# Patient Record
Sex: Male | Born: 1965 | Race: Black or African American | Hispanic: No | Marital: Single | Smoking: Never smoker
Health system: Southern US, Community
[De-identification: ages and names within clinical notes are randomized; demographics above are authoritative.]

## PROBLEM LIST (undated history)

## (undated) DIAGNOSIS — I10 Essential (primary) hypertension: Secondary | ICD-10-CM

## (undated) HISTORY — PX: TONSILLECTOMY: SUR1361

## (undated) NOTE — *Deleted (*Deleted)
*  PRELIMINARY RESULTS* Echocardiogram 2D Echocardiogram has been performed.  Joanette Gula Heiss 06/10/2020, 10:50 AM

---

## 2015-12-03 NOTE — Progress Notes (Signed)
Formatting of this note might be different from the original.  Employment testing    Electronically signed by Cornell Barman, CMA at 12/03/2015 12:52 PM EDT

## 2017-05-03 NOTE — Progress Notes (Signed)
Formatting of this note might be different from the original.  Employment testing    Electronically signed by Cornell Barman, MOA at 05/03/2017  3:35 PM EDT

## 2017-08-07 ENCOUNTER — Encounter: Payer: Self-pay | Admitting: Emergency Medicine

## 2017-08-07 ENCOUNTER — Emergency Department: Payer: No Typology Code available for payment source

## 2017-08-07 ENCOUNTER — Emergency Department
Admission: EM | Admit: 2017-08-07 | Discharge: 2017-08-07 | Disposition: A | Discharge status: Home / Self Care | Payer: No Typology Code available for payment source | Attending: Emergency Medicine | Admitting: Emergency Medicine

## 2017-08-07 DIAGNOSIS — Y999 Unspecified external cause status: Secondary | ICD-10-CM | POA: Diagnosis not present

## 2017-08-07 DIAGNOSIS — M47816 Spondylosis without myelopathy or radiculopathy, lumbar region: Secondary | ICD-10-CM | POA: Insufficient documentation

## 2017-08-07 DIAGNOSIS — S161XXA Strain of muscle, fascia and tendon at neck level, initial encounter: Secondary | ICD-10-CM | POA: Diagnosis not present

## 2017-08-07 DIAGNOSIS — M47812 Spondylosis without myelopathy or radiculopathy, cervical region: Secondary | ICD-10-CM

## 2017-08-07 DIAGNOSIS — I10 Essential (primary) hypertension: Secondary | ICD-10-CM | POA: Diagnosis not present

## 2017-08-07 DIAGNOSIS — Y939 Activity, unspecified: Secondary | ICD-10-CM | POA: Diagnosis not present

## 2017-08-07 DIAGNOSIS — S39012A Strain of muscle, fascia and tendon of lower back, initial encounter: Secondary | ICD-10-CM | POA: Insufficient documentation

## 2017-08-07 DIAGNOSIS — Y92410 Unspecified street and highway as the place of occurrence of the external cause: Secondary | ICD-10-CM | POA: Insufficient documentation

## 2017-08-07 DIAGNOSIS — S3992XA Unspecified injury of lower back, initial encounter: Secondary | ICD-10-CM | POA: Diagnosis present

## 2017-08-07 HISTORY — DX: Essential (primary) hypertension: I10
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
## 2017-08-07 NOTE — Discharge Instructions (Signed)
Continue previous medications and [facility].
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
## 2020-05-31 ENCOUNTER — Inpatient Hospital Stay
Admission: EM | Admit: 2020-05-31 | Discharge: 2020-06-15 | DRG: 871 | Disposition: A | Discharge status: Home Health | Payer: Self-pay | Attending: Family Medicine | Admitting: Family Medicine

## 2020-05-31 ENCOUNTER — Encounter: Payer: Self-pay | Admitting: Internal Medicine

## 2020-05-31 ENCOUNTER — Other Ambulatory Visit: Payer: Self-pay

## 2020-05-31 ENCOUNTER — Emergency Department: Payer: Self-pay

## 2020-05-31 DIAGNOSIS — I110 Hypertensive heart disease with heart failure: Secondary | ICD-10-CM | POA: Diagnosis present

## 2020-05-31 DIAGNOSIS — L03116 Cellulitis of left lower limb: Principal | ICD-10-CM | POA: Diagnosis present

## 2020-05-31 DIAGNOSIS — R6520 Severe sepsis without septic shock: Secondary | ICD-10-CM | POA: Diagnosis present

## 2020-05-31 DIAGNOSIS — I10 Essential (primary) hypertension: Secondary | ICD-10-CM | POA: Diagnosis present

## 2020-05-31 DIAGNOSIS — Z79899 Other long term (current) drug therapy: Secondary | ICD-10-CM

## 2020-05-31 DIAGNOSIS — A419 Sepsis, unspecified organism: Principal | ICD-10-CM | POA: Diagnosis present

## 2020-05-31 DIAGNOSIS — R000 Tachycardia, unspecified: Secondary | ICD-10-CM | POA: Diagnosis present

## 2020-05-31 DIAGNOSIS — I272 Pulmonary hypertension, unspecified: Secondary | ICD-10-CM | POA: Diagnosis present

## 2020-05-31 DIAGNOSIS — H9311 Tinnitus, right ear: Secondary | ICD-10-CM | POA: Diagnosis not present

## 2020-05-31 DIAGNOSIS — K5909 Other constipation: Secondary | ICD-10-CM | POA: Diagnosis present

## 2020-05-31 DIAGNOSIS — L0292 Furuncle, unspecified: Secondary | ICD-10-CM | POA: Diagnosis present

## 2020-05-31 DIAGNOSIS — M7989 Other specified soft tissue disorders: Secondary | ICD-10-CM | POA: Diagnosis present

## 2020-05-31 DIAGNOSIS — Z20822 Contact with and (suspected) exposure to covid-19: Secondary | ICD-10-CM | POA: Diagnosis present

## 2020-05-31 DIAGNOSIS — R700 Elevated erythrocyte sedimentation rate: Secondary | ICD-10-CM | POA: Diagnosis not present

## 2020-05-31 DIAGNOSIS — Z8249 Family history of ischemic heart disease and other diseases of the circulatory system: Secondary | ICD-10-CM

## 2020-05-31 DIAGNOSIS — E860 Dehydration: Secondary | ICD-10-CM | POA: Diagnosis present

## 2020-05-31 DIAGNOSIS — I5041 Acute combined systolic (congestive) and diastolic (congestive) heart failure: Secondary | ICD-10-CM

## 2020-05-31 DIAGNOSIS — R739 Hyperglycemia, unspecified: Secondary | ICD-10-CM | POA: Diagnosis present

## 2020-05-31 DIAGNOSIS — L299 Pruritus, unspecified: Secondary | ICD-10-CM | POA: Diagnosis not present

## 2020-05-31 DIAGNOSIS — I83228 Varicose veins of left lower extremity with both ulcer of other part of lower extremity and inflammation: Secondary | ICD-10-CM | POA: Diagnosis present

## 2020-05-31 DIAGNOSIS — M542 Cervicalgia: Secondary | ICD-10-CM | POA: Diagnosis present

## 2020-05-31 DIAGNOSIS — N179 Acute kidney failure, unspecified: Secondary | ICD-10-CM | POA: Diagnosis present

## 2020-05-31 DIAGNOSIS — Z6841 Body Mass Index (BMI) 40.0 and over, adult: Secondary | ICD-10-CM

## 2020-05-31 DIAGNOSIS — M726 Necrotizing fasciitis: Secondary | ICD-10-CM | POA: Diagnosis present

## 2020-05-31 DIAGNOSIS — K5900 Constipation, unspecified: Secondary | ICD-10-CM

## 2020-05-31 DIAGNOSIS — R7982 Elevated C-reactive protein (CRP): Secondary | ICD-10-CM | POA: Diagnosis not present

## 2020-05-31 DIAGNOSIS — E876 Hypokalemia: Secondary | ICD-10-CM | POA: Diagnosis present

## 2020-05-31 DIAGNOSIS — S81802A Unspecified open wound, left lower leg, initial encounter: Secondary | ICD-10-CM | POA: Diagnosis present

## 2020-05-31 DIAGNOSIS — E871 Hypo-osmolality and hyponatremia: Secondary | ICD-10-CM | POA: Diagnosis present

## 2020-05-31 DIAGNOSIS — I5043 Acute on chronic combined systolic (congestive) and diastolic (congestive) heart failure: Secondary | ICD-10-CM | POA: Diagnosis not present

## 2020-05-31 DIAGNOSIS — D75839 Thrombocytosis, unspecified: Secondary | ICD-10-CM | POA: Diagnosis present

## 2020-05-31 DIAGNOSIS — R7303 Prediabetes: Secondary | ICD-10-CM | POA: Diagnosis present

## 2020-05-31 LAB — CBC WITH DIFFERENTIAL/PLATELET
Abs Immature Granulocytes: 3.31 10*3/uL — ABNORMAL HIGH (ref 0.00–0.07)
Basophils Absolute: 0 10*3/uL (ref 0.0–0.1)
Basophils Relative: 0 %
Eosinophils Absolute: 0.3 10*3/uL (ref 0.0–0.5)
Eosinophils Relative: 1 %
HCT: 43.2 % (ref 39.0–52.0)
Hemoglobin: 14.6 g/dL (ref 13.0–17.0)
Immature Granulocytes: 10 %
Lymphocytes Relative: 6 %
Lymphs Abs: 1.9 10*3/uL (ref 0.7–4.0)
MCH: 29 pg (ref 26.0–34.0)
MCHC: 33.8 g/dL (ref 30.0–36.0)
MCV: 85.7 fL (ref 80.0–100.0)
Monocytes Absolute: 1.9 10*3/uL — ABNORMAL HIGH (ref 0.1–1.0)
Monocytes Relative: 6 %
Neutro Abs: 25.8 10*3/uL — ABNORMAL HIGH (ref 1.7–7.7)
Neutrophils Relative %: 77 %
Platelets: 361 10*3/uL (ref 150–400)
RBC: 5.04 MIL/uL (ref 4.22–5.81)
RDW: 14.8 % (ref 11.5–15.5)
Smear Review: NORMAL
WBC: 33.3 10*3/uL — ABNORMAL HIGH (ref 4.0–10.5)
nRBC: 0 % (ref 0.0–0.2)

## 2020-05-31 LAB — COMPREHENSIVE METABOLIC PANEL
ALT: 36 U/L (ref 0–44)
AST: 26 U/L (ref 15–41)
Albumin: 3 g/dL — ABNORMAL LOW (ref 3.5–5.0)
Alkaline Phosphatase: 116 U/L (ref 38–126)
Anion gap: 12 (ref 5–15)
BUN: 25 mg/dL — ABNORMAL HIGH (ref 6–20)
CO2: 22 mmol/L (ref 22–32)
Calcium: 8.3 mg/dL — ABNORMAL LOW (ref 8.9–10.3)
Chloride: 98 mmol/L (ref 98–111)
Creatinine, Ser: 1.49 mg/dL — ABNORMAL HIGH (ref 0.61–1.24)
GFR, Estimated: 55 mL/min — ABNORMAL LOW (ref >60)
Glucose, Bld: 129 mg/dL — ABNORMAL HIGH (ref 70–99)
Potassium: 3.4 mmol/L — ABNORMAL LOW (ref 3.5–5.1)
Sodium: 132 mmol/L — ABNORMAL LOW (ref 135–145)
Total Bilirubin: 1.5 mg/dL — ABNORMAL HIGH (ref 0.3–1.2)
Total Protein: 8.1 g/dL (ref 6.5–8.1)

## 2020-05-31 LAB — LACTIC ACID, PLASMA
Lactic Acid, Venous: 1.7 mmol/L (ref 0.5–1.9)
Lactic Acid, Venous: 1.5 mmol/L (ref 0.5–1.9)

## 2020-05-31 LAB — PROCALCITONIN: Procalcitonin: 4.22 ng/mL

## 2020-05-31 LAB — PROTIME-INR
INR: 1.3 — ABNORMAL HIGH (ref 0.8–1.2)
Prothrombin Time: 15.4 seconds — ABNORMAL HIGH (ref 11.4–15.2)

## 2020-05-31 LAB — PATHOLOGIST SMEAR REVIEW

## 2020-05-31 LAB — RESPIRATORY PANEL BY RT PCR (FLU A&B, COVID)
Influenza A by PCR: NEGATIVE
Influenza B by PCR: NEGATIVE
SARS Coronavirus 2 by RT PCR: NEGATIVE

## 2020-05-31 LAB — SEDIMENTATION RATE: Sed Rate: 82 mm/hr — ABNORMAL HIGH (ref 0–20)

## 2020-05-31 LAB — C-REACTIVE PROTEIN: CRP: 40.2 mg/dL — ABNORMAL HIGH (ref <1.0)

## 2020-05-31 LAB — MAGNESIUM: Magnesium: 2.5 mg/dL — ABNORMAL HIGH (ref 1.7–2.4)

## 2020-05-31 IMAGING — US US EXTREM LOW VENOUS*L*
1 series · 13 of 24 positions shown · non-contrast
Comparison: None.

CLINICAL DATA: Edema and redness

EXAM:
LEFT LOWER EXTREMITY VENOUS DUPLEX ULTRASOUND
TECHNIQUE: Gray-scale sonography with graded compression, as well as color
Doppler and duplex ultrasound were performed to evaluate the left
lower extremity deep venous system from the level of the common
femoral vein and including the common femoral, femoral, profunda
femoral, popliteal and calf veins including the posterior tibial,
peroneal and gastrocnemius veins when visible. The superficial great
saphenous vein was also interrogated. Spectral Doppler was utilized
to evaluate flow at rest and with distal augmentation maneuvers in
the common femoral, femoral and popliteal veins.

[Series 1: us venous img lower uni left (dvt) · portal-venous · 13 of 53 slices shown]
[im 1/53]
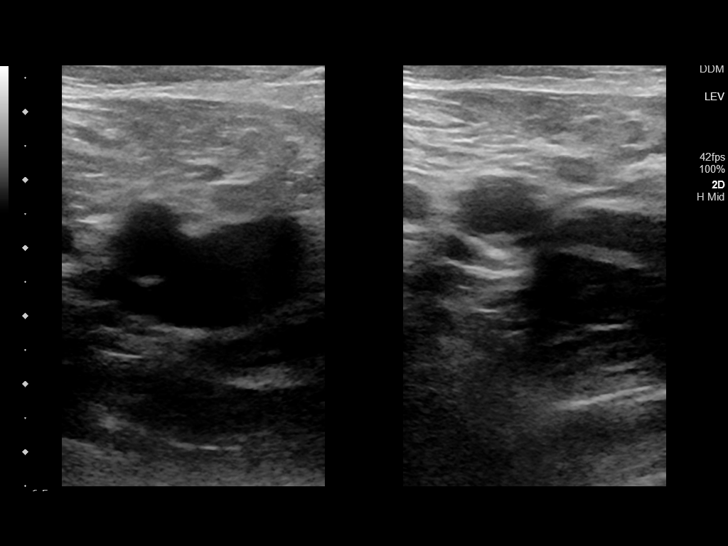
[im 5/53]
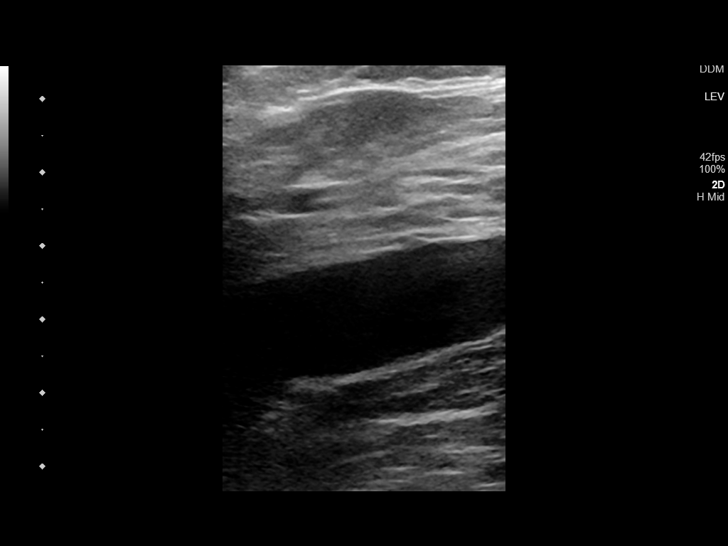
[im 10/53]
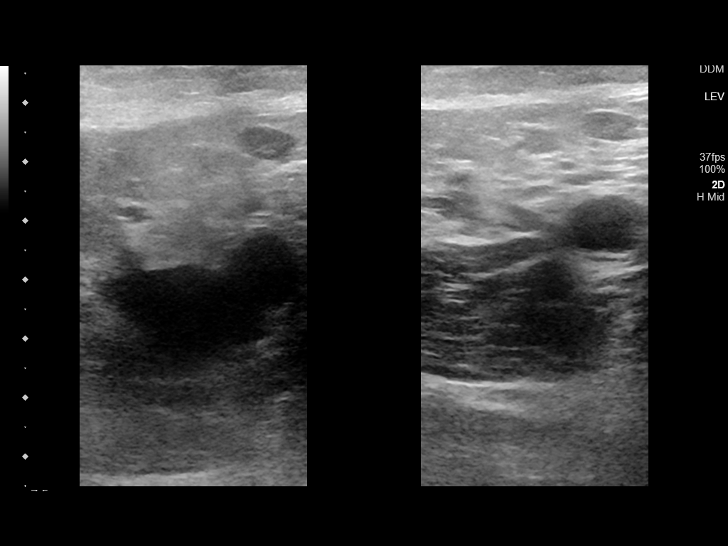
[im 14/53]
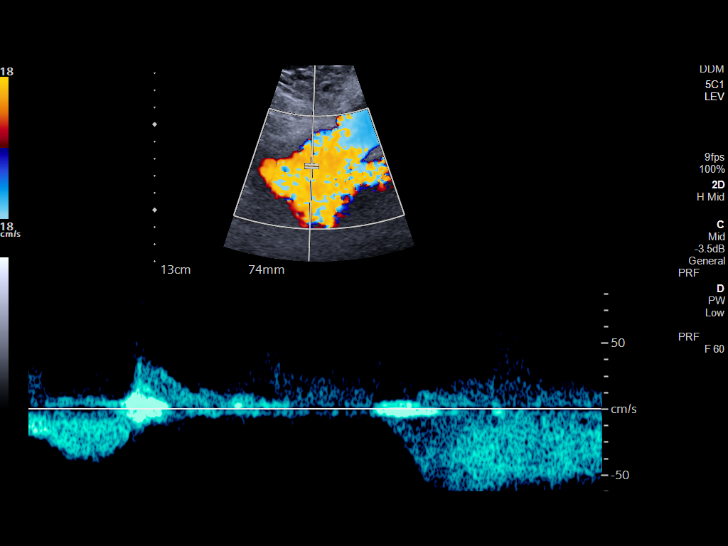
[im 19/53]
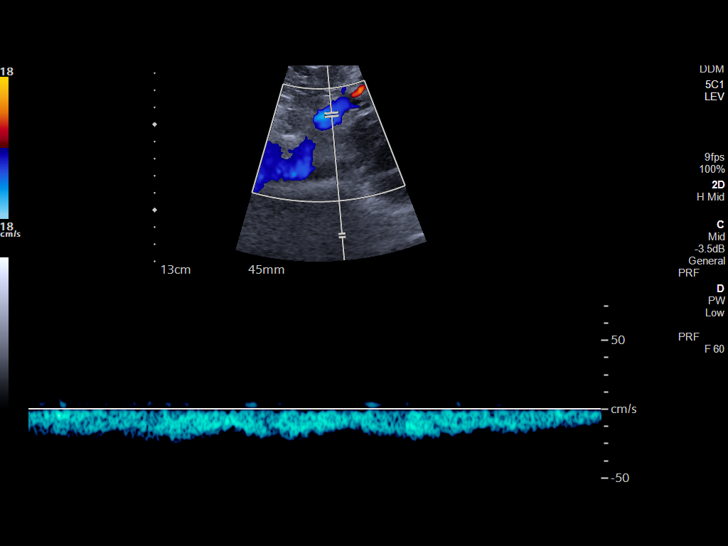
[im 23/53]
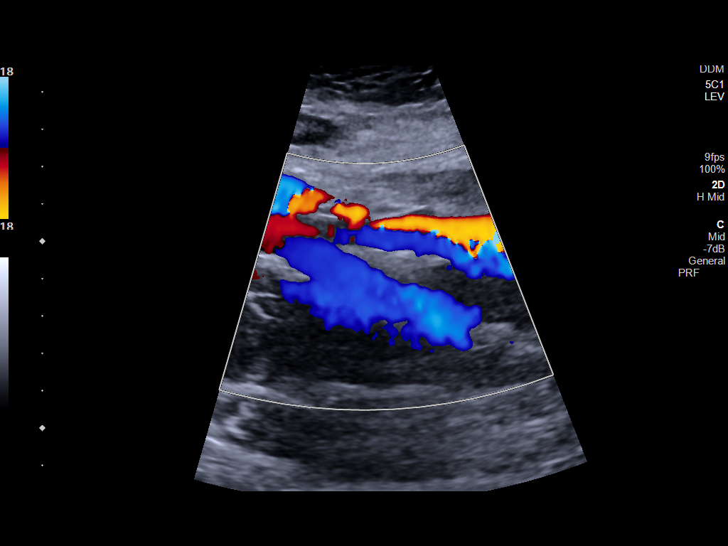
[im 28/53]
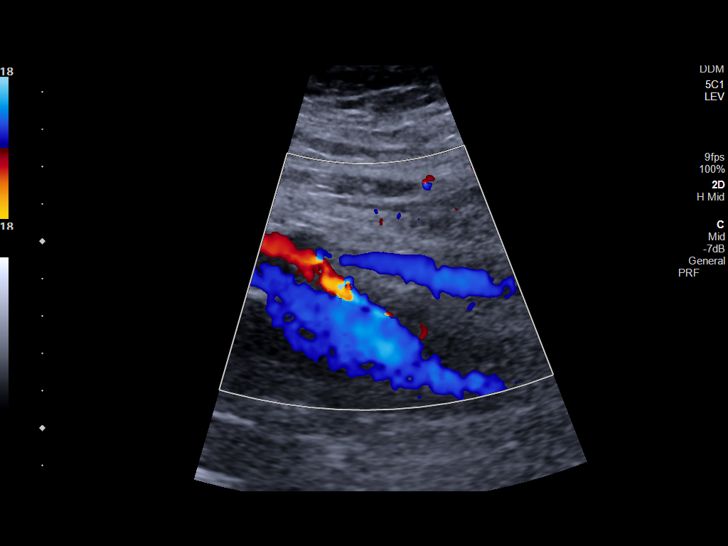
[im 30/53]
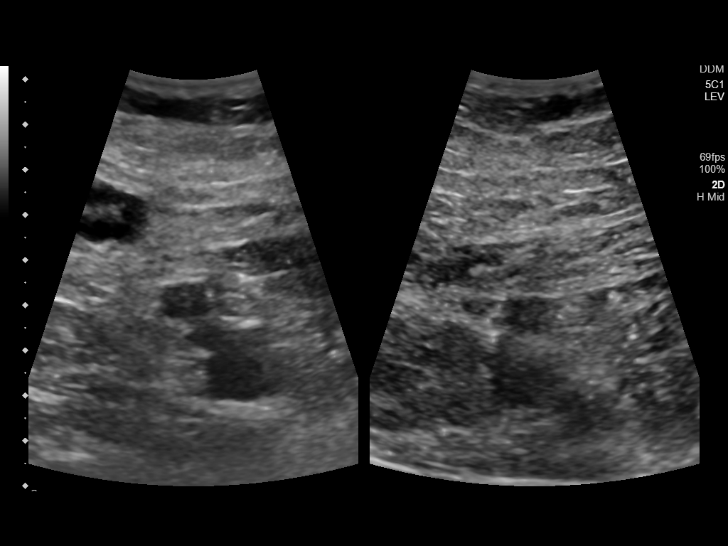
[im 34/53]
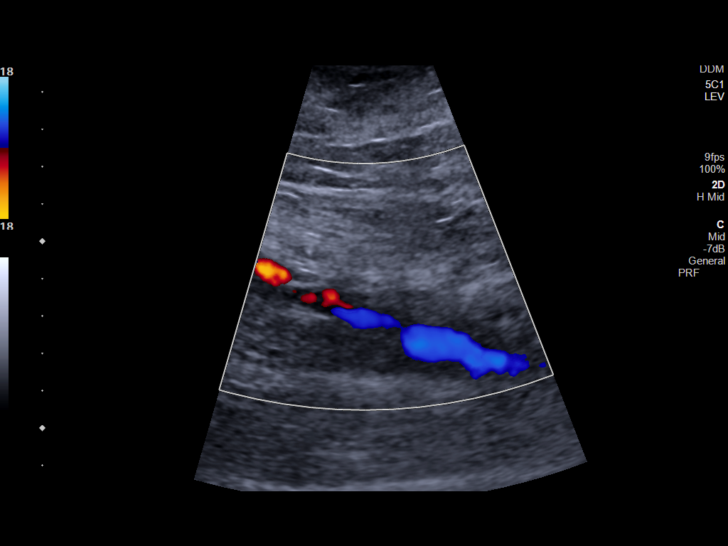
[im 39/53]
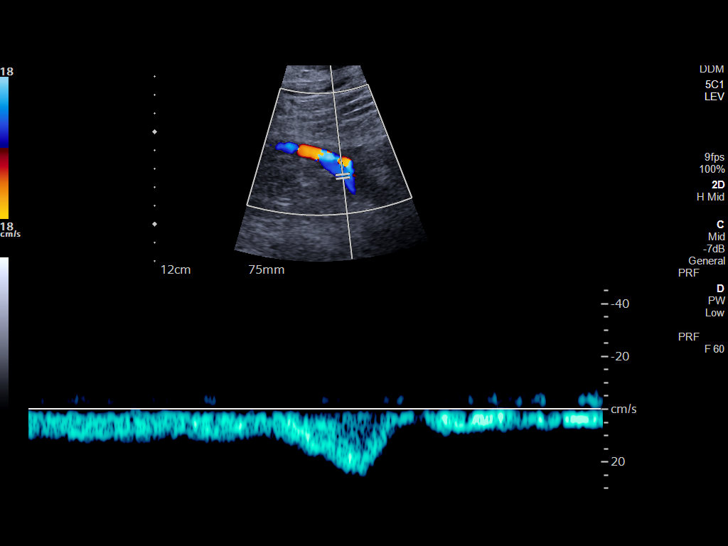
[im 43/53]
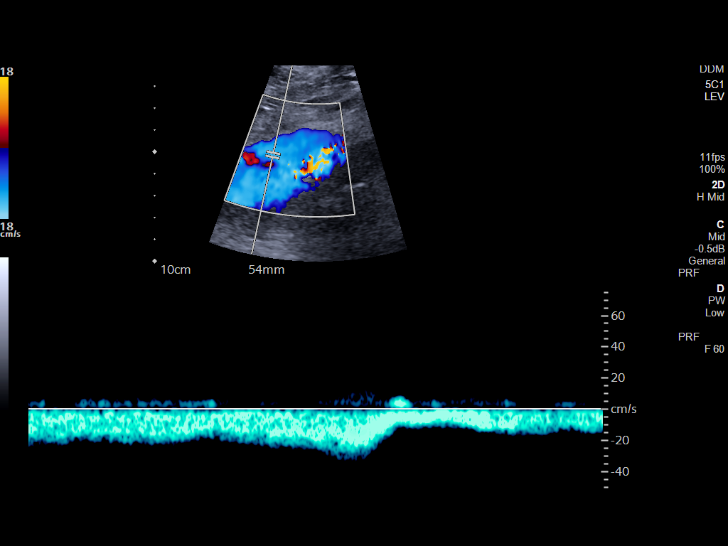
[im 48/53]
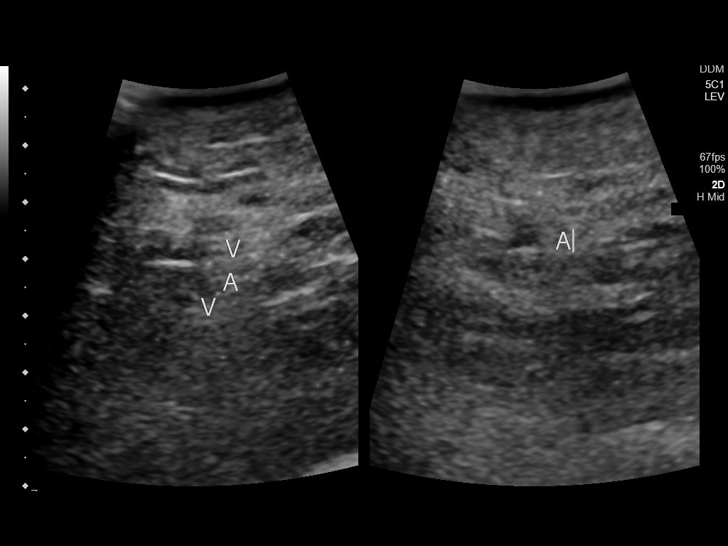
[im 53/53]
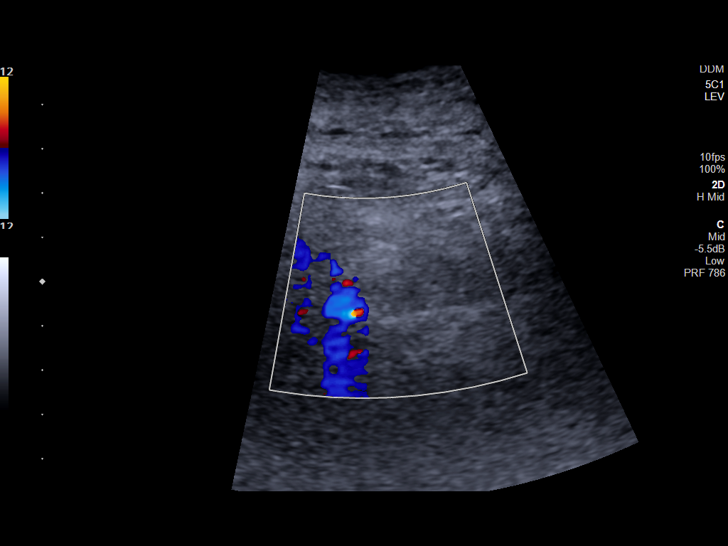

[13 of 24 positions shown; findings below may reference images not displayed]

FINDINGS: Contralateral Common Femoral Vein: Respiratory phasicity is normal
and symmetric with the symptomatic side. No evidence of thrombus.
Normal compressibility.

Common Femoral Vein: No evidence of thrombus. Normal
compressibility, respiratory phasicity and response to augmentation.

Saphenofemoral Junction: No evidence of thrombus. Normal
compressibility and flow on color Doppler imaging.

Profunda Femoral Vein: No evidence of thrombus. Normal
compressibility and flow on color Doppler imaging.

Femoral Vein: No evidence of thrombus. Normal compressibility,
respiratory phasicity and response to augmentation.

Popliteal Vein: No evidence of thrombus. Normal compressibility,
respiratory phasicity and response to augmentation.

Calf Veins: No evidence of thrombus. Normal compressibility and flow
on color Doppler imaging.

Superficial Great Saphenous Vein: No evidence of thrombus. Normal
compressibility.

Venous Reflux:  None.

Other Findings:  There is diffuse soft tissue edema.
IMPRESSION: No evidence of deep venous thrombosis in the left lower extremity.
Right common femoral vein also present. Diffuse left lower extremity
soft tissue edema noted.

## 2020-05-31 MED ORDER — VANCOMYCIN HCL IN DEXTROSE 1-5 GM/200ML-% IV SOLN
1000.0000 mg | Freq: Once | INTRAVENOUS | Status: AC
  Administered 2020-05-31: 1000 mg via INTRAVENOUS
  Filled 2020-05-31: qty 200

## 2020-05-31 MED ORDER — SODIUM CHLORIDE 0.9 % IV SOLN
2.0000 g | INTRAVENOUS | Status: DC
  Administered 2020-05-31 – 2020-06-08 (×9): 2 g via INTRAVENOUS
  Filled 2020-05-31: qty 2
  Filled 2020-05-31 (×4): qty 20
  Filled 2020-05-31 (×4): qty 2
  Filled 2020-05-31: qty 20

## 2020-05-31 MED ORDER — VANCOMYCIN HCL IN DEXTROSE 1-5 GM/200ML-% IV SOLN
1000.0000 mg | Freq: Two times a day (BID) | INTRAVENOUS | Status: DC
  Administered 2020-06-01 – 2020-06-03 (×5): 1000 mg via INTRAVENOUS
  Filled 2020-05-31 (×8): qty 200

## 2020-05-31 MED ORDER — ONDANSETRON HCL 4 MG/2ML IJ SOLN
4.0000 mg | Freq: Three times a day (TID) | INTRAMUSCULAR | Status: DC | PRN
Start: 2020-05-31 — End: 2020-06-15

## 2020-05-31 MED ORDER — ONDANSETRON HCL 4 MG/2ML IJ SOLN
4.0000 mg | Freq: Once | INTRAMUSCULAR | Status: AC
  Administered 2020-05-31: 4 mg via INTRAVENOUS
  Filled 2020-05-31: qty 2

## 2020-05-31 MED ORDER — HYDRALAZINE HCL 20 MG/ML IJ SOLN: 5.0000 mg | INTRAMUSCULAR | Status: DC | PRN

## 2020-05-31 MED ORDER — VANCOMYCIN HCL 1500 MG/300ML IV SOLN
1500.0000 mg | Freq: Once | INTRAVENOUS | Status: AC
  Administered 2020-05-31: 1500 mg via INTRAVENOUS
  Filled 2020-05-31: qty 300

## 2020-05-31 MED ORDER — ACETAMINOPHEN 325 MG PO TABS: 650.0000 mg | ORAL_TABLET | Freq: Four times a day (QID) | ORAL | Status: DC | PRN

## 2020-05-31 MED ORDER — MORPHINE SULFATE (PF) 4 MG/ML IV SOLN
4.0000 mg | Freq: Once | INTRAVENOUS | Status: AC
  Administered 2020-05-31: 4 mg via INTRAVENOUS
  Filled 2020-05-31: qty 1

## 2020-05-31 MED ORDER — SODIUM CHLORIDE 0.9 % IV BOLUS
2000.0000 mL | Freq: Once | INTRAVENOUS | Status: AC
  Administered 2020-05-31: 2000 mL via INTRAVENOUS

## 2020-05-31 MED ORDER — OXYCODONE-ACETAMINOPHEN 5-325 MG PO TABS
1.0000 | ORAL_TABLET | ORAL | Status: DC | PRN
  Administered 2020-05-31 – 2020-06-02 (×3): 1 via ORAL
  Filled 2020-05-31 (×3): qty 1

## 2020-05-31 MED ORDER — MORPHINE SULFATE (PF) 2 MG/ML IV SOLN
2.0000 mg | INTRAVENOUS | Status: DC | PRN
  Administered 2020-05-31: 2 mg via INTRAVENOUS
  Filled 2020-05-31: qty 1

## 2020-05-31 MED ORDER — POTASSIUM CHLORIDE CRYS ER 20 MEQ PO TBCR
20.0000 meq | EXTENDED_RELEASE_TABLET | Freq: Once | ORAL | Status: AC
  Administered 2020-05-31: 20 meq via ORAL
  Filled 2020-05-31: qty 1

## 2020-05-31 NOTE — ED Triage Notes (Addendum)
Pt via EMS c/o L leg pain, swelling, and weeping yellowish discharge. Pt denies hx of diabetes and CHF. Pt states it started a couple of days ago. Pt has large blister that busted at this time. Pt states it is painful to walk on. Pt is A&Ox4 and NAD.

## 2020-05-31 NOTE — ED Triage Notes (Signed)
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 198/112 , HR100, 97%RA,  97.7.

## 2020-05-31 NOTE — ED Notes (Signed)
This RN unable to obtain IV access at this time. 2nd RN will attempt. IV team consult placed

## 2020-05-31 NOTE — H&P (Signed)
History and Physical    [name] GNF:[id number] DOB: 12/17/1965 DOA: 05/31/2020  Referring MD/NP/PA:   PCP: Patient, No Pcp Per   Patient coming from:  The patient is coming from home.  At baseline, pt is independent for most of ADL.        Chief Complaint: left leg pain, swelling and weeping  HPI: Stephen [name] is a 54 y.o. male with medical history significant of HTN, who presents with left leg pain, swelling, weeping.  Patient states that his left leg pain started approximately 5 days ago, which has been progressively worsening.  Left leg is erythematous, swelling, with significant blistering, open wound and weeping from several popped blisters. The pain is constant, sharp, moderate to severe, nonradiating. 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 Patient denies fever or chills.  No chest pain, cough, shortness breath.  Denies nausea vomiting, diarrhea, abdominal pain, symptoms of UTI or unilateral weakness.  ED Course: pt was found to have WBC 33.3, lactic acid 1.7, pending COVID-19 PCR, AKI with creatinine 1.49, BUN 25 (no baseline creatinine available), temperature normal, blood pressure 126/75, tachycardia with heart rate of 100 --> 95, RR 20, oxygen saturation 96% on room air.  Left lower leg venous Doppler is negative for DVT.  Patient is placed on MedSurg bed for observation.   Review of Systems:   General: no fevers, chills, no body weight gain, has poor appetite, has fatigue HEENT: no blurry vision, hearing changes or sore throat Respiratory: no dyspnea, coughing, wheezing CV: no chest pain, no palpitations GI: no nausea, vomiting, abdominal pain, diarrhea, constipation GU: no dysuria, burning on urination, increased urinary frequency, hematuria  Ext: has left leg pain, swelling, weeping, wound and blistering Neuro: no unilateral weakness, numbness, or tingling, no vision change or hearing loss Skin: no rash.  Has open wound in the left lower  leg MSK: No muscle spasm, no deformity, no limitation of range of movement in spin Heme: No easy bruising.  Travel history: No recent long distant travel.  Allergy: No Known Allergies  Past Medical History:  Diagnosis Date  . Hypertension     Past Surgical History:  Procedure Laterality Date  . TONSILLECTOMY      Social History:  reports that he has never smoked. He has never used smokeless tobacco. He reports current alcohol use. He reports that he does not use drugs.  Family History:  Family History  Problem Relation Age of Onset  . Hypertension Mother      Prior to Admission medications   Medication Sig Start Date End Date Taking? Authorizing Provider  diclofenac (VOLTAREN) 75 MG EC tablet Take 75 mg by mouth 2 (two) times daily.    [provider]  methocarbamol (ROBAXIN) 750 MG tablet Take 750 mg by mouth 4 (four) times daily.    [provider]    Physical Exam: Vitals:   05/31/20 1003 05/31/20 1004 05/31/20 1007  BP: 126/75    Pulse: 95    Resp: 20    Temp:   97.9 F (36.6 C)  TempSrc:   Oral  SpO2: 96%    Weight:  136.1 kg   Height:  $Remove'5\' 11"'BHzAdFv$  (1.803 m)    General: Not in acute distress HEENT:       Eyes: PERRL, EOMI, no scleral icterus.       ENT: No discharge from the ears and nose, no pharynx injection, no tonsillar enlargement.  Neck: No JVD, no bruit, no mass felt. Heme: No neck lymph node enlargement. Cardiac: S1/S2, RRR, No murmurs, No gallops or rubs. Respiratory:  No rales, wheezing, rhonchi or rubs. GI: Soft, nondistended, nontender, no rebound pain, no organomegaly, BS present. GU: No hematuria Ext: 1+DP/PT pulse bilaterally.  Has erythema, tenderness, warmth, swelling in left leg from ankle up to the level above knee, with significant weeping, open wound and multiple blisters .      Musculoskeletal: No joint deformities, No joint redness or warmth, no limitation of ROM in spin. Skin: No rashes.  Neuro: Alert,  oriented X3, cranial nerves II-XII grossly intact, moves all extremities normally.  Psych: Patient is not psychotic, no suicidal or hemocidal ideation.  Labs on Admission: I have personally reviewed following labs and imaging studies  CBC: Recent Labs  Lab 05/31/20 1014  WBC 33.3*  NEUTROABS 25.8*  HGB 14.6  HCT 43.2  MCV 85.7  PLT 595   Basic Metabolic Panel: Recent Labs  Lab 05/31/20 1014  NA 132*  K 3.4*  CL 98  CO2 22  GLUCOSE 129*  BUN 25*  CREATININE 1.49*  CALCIUM 8.3*   GFR: Estimated Creatinine Clearance: 79.8 mL/min (A) (by C-G formula based on SCr of 1.49 mg/dL (H)). Liver Function Tests: Recent Labs  Lab 05/31/20 1014  AST 26  ALT 36  ALKPHOS 116  BILITOT 1.5*  PROT 8.1  ALBUMIN 3.0*   No results for input(s): LIPASE, AMYLASE in the last 168 hours. No results for input(s): AMMONIA in the last 168 hours. Coagulation Profile: No results for input(s): INR, PROTIME in the last 168 hours. Cardiac Enzymes: No results for input(s): CKTOTAL, CKMB, CKMBINDEX, TROPONINI in the last 168 hours. BNP (last 3 results) No results for input(s): PROBNP in the last 8760 hours. HbA1C: No results for input(s): HGBA1C in the last 72 hours. CBG: No results for input(s): GLUCAP in the last 168 hours. Lipid Profile: No results for input(s): CHOL, HDL, LDLCALC, TRIG, CHOLHDL, LDLDIRECT in the last 72 hours. Thyroid Function Tests: No results for input(s): TSH, T4TOTAL, FREET4, T3FREE, THYROIDAB in the last 72 hours. Anemia Panel: No results for input(s): VITAMINB12, FOLATE, FERRITIN, TIBC, IRON, RETICCTPCT in the last 72 hours. Urine analysis: No results found for: COLORURINE, APPEARANCEUR, LABSPEC, PHURINE, GLUCOSEU, HGBUR, BILIRUBINUR, KETONESUR, PROTEINUR, UROBILINOGEN, NITRITE, LEUKOCYTESUR Sepsis Labs: $RemoveBefo'@LABRCNTIP'jfirONbhulE$ (procalcitonin:4,lacticidven:4) )No results found for this or any previous visit (from the past 240 hour(s)).   Radiological Exams on Admission: US  Venous Img Lower Unilateral Left  Result Date: 05/31/2020 CLINICAL DATA:  Edema and redness EXAM: LEFT LOWER EXTREMITY VENOUS DUPLEX ULTRASOUND TECHNIQUE: Gray-scale sonography with graded compression, as well as color Doppler and duplex ultrasound were performed to evaluate the left lower extremity deep venous system from the level of the common femoral vein and including the common femoral, femoral, profunda femoral, popliteal and calf veins including the posterior tibial, peroneal and gastrocnemius veins when visible. The superficial great saphenous vein was also interrogated. Spectral Doppler was utilized to evaluate flow at rest and with distal augmentation maneuvers in the common femoral, femoral and popliteal veins. COMPARISON:  None. FINDINGS: Contralateral Common Femoral Vein: Respiratory phasicity is normal and symmetric with the symptomatic side. No evidence of thrombus. Normal compressibility. Common Femoral Vein: No evidence of thrombus. Normal compressibility, respiratory phasicity and response to augmentation. Saphenofemoral Junction: No evidence of thrombus. Normal compressibility and flow on color Doppler imaging. Profunda Femoral Vein: No evidence of thrombus. Normal compressibility and flow on color Doppler imaging.  Femoral Vein: No evidence of thrombus. Normal compressibility, respiratory phasicity and response to augmentation. Popliteal Vein: No evidence of thrombus. Normal compressibility, respiratory phasicity and response to augmentation. Calf Veins: No evidence of thrombus. Normal compressibility and flow on color Doppler imaging. Superficial Great Saphenous Vein: No evidence of thrombus. Normal compressibility. Venous Reflux:  None. Other Findings:  There is diffuse soft tissue edema. IMPRESSION: No evidence of deep venous thrombosis in the left lower extremity. Right common femoral vein also present. Diffuse left lower extremity soft tissue edema noted. Electronically Signed   By:  Lowella Grip III M.D.   On: 05/31/2020 12:48     EKG:   Not done in ED, will get one.   Assessment/Plan Principal Problem:   Left leg cellulitis Active Problems:   Hypertension   Sepsis ([place])   Hypokalemia   AKI (acute kidney injury) ([place])   Cellulitis of left leg   Sepsis due to left leg cellulitis: Patient meets criteria for sepsis with leukocytosis with WBC 33.3 and tachycardia with heart rate 100 -->95.  Lactate is normal, currently hemodynamically stable.  Left lower extremity venous Doppler is negative for DVT.   - Placed on MedSurg bed for observation - Empiric antimicrobial treatment with vancomycin and Rocephin - PRN Zofran for nausea, morphine and Percocet for pain - Blood cultures x 2  - ESR and CRP - wound care consult - IVF: will give 2 L of NS bolus -will get Procalcitonin   Hypertension: Blood pressure 126/75, patient not taking medications at home. -IV hydralazine as needed  Hypokalemia: Potassium 3.4 -Pleated potassium -Check magnesium level  AKI (acute kidney injury) [facility]): Creatinine 1.49, BUN 25, no baseline creatinine available, may be due to dehydration -Avoid using renal toxic medications -IV fluid as above -Follow-up with BMP      DVT ppx: SQ Lovenox Code Status: Full code Family Communication: not done, no family member is at bed side.    Disposition Plan:  Anticipate discharge back to previous environment Consults called:  none Admission status: Med-surg bed for obs       Status is: Inpatient  Remains inpatient appropriate because:Inpatient level of care appropriate due to severity of illness.  Patient has history of hypertension, now presents with left leg cellulitis with very significant erythema, weeping, open wound, multiple blisters.  Patient meets criteria for sepsis.  His presentation is highly complicated.  Patient is at high risk of deterioration.  Patient will need to be treated in hospital for at least 2 days.   Dispo:  The patient is from: Home              Anticipated d/c is to: Home              Anticipated d/c date is: 2 days              Patient currently is not medically stable to d/c.              Date of Service 05/31/2020    Ivor Costa [facility]   If 7PM-7AM, please contact night-coverage [email or web address] 05/31/2020, 2:06 PM

## 2020-05-31 NOTE — ED Notes (Signed)
Lt green, gray, purple, and one set of cultures sent to lab

## 2020-05-31 NOTE — Consult Note (Signed)
WOC Nurse Consult Note: Remote consult completed with aid of bedside RN, Tresa Endo.  Reason for Consult: cellulitis to left leg. Ruptured blister to left anterior and lateral leg Wound type:infectious  Pressure Injury POA: NA Measurement: bedside RN to perform.  Wound AJO:INOMVEHM blister with erythematous woundbed Drainage (amount, consistency, odor) moderate weeping.  Periwound:edema, erythema and tenderness to left leg only.  Dressing procedure/placement/frequency: Cleanse left leg with NS and pat dry. Apply Xeroform to wound bed.  COver with dry gauze and kerlix and tape.  Change twice daily.   Will not follow at this time.  Please re-consult if needed.  Maple Hudson MSN, RN, FNP-BC CWON Wound, Ostomy, Continence Nurse Pager [phone]

## 2020-05-31 NOTE — Progress Notes (Signed)
Pharmacy Antibiotic Note  Stephen [name] is a 54 y.o. male admitted on 05/31/2020 with left leg cellulitis. Patient reported LLE pain started about 5 days go. According to admission note leg is swallow with significant blistering.  Pharmacy has been consulted for vancomycin dosing. Patient is also receiving ceftriaxone 2g IV every 24 hours.   Plan: Vancomycin 1000mg  + 1500mg  IV ordered for a total loading dose of vancomycin 2500mg . Will start vancomycin 1000mg  IV every 12 hours.   Pharmacy will continue to monitor renal function and adjust dose as needed. Will plan to order vancomycin trough after 4-5 doses.   Height: 5\' 11"  (180.3 cm) Weight: 136.1 kg (300 lb) IBW/kg (Calculated) : 75.3  Temp (24hrs), Avg:97.9 F (36.6 C), Min:97.9 F (36.6 C), Max:97.9 F (36.6 C)  Recent Labs  Lab 05/31/20 1014  WBC 33.3*  CREATININE 1.49*  LATICACIDVEN 1.7    Estimated Creatinine Clearance: 79.8 mL/min (A) (by C-G formula based on SCr of 1.49 mg/dL (H)).    No Known Allergies  Antimicrobials this admission: 10/25 vancomycin  >>  10/25 ceftriaxone >>  Microbiology results: 10/25  BCx: pending  Thank you for allowing pharmacy to be a part of this patient's care.  , PharmD, BCPS Clinical Pharmacist 05/31/2020 2:26 PM

## 2020-05-31 NOTE — Progress Notes (Signed)
CODE SEPSIS - PHARMACY COMMUNICATION  **Broad Spectrum Antibiotics should be administered within 1 hour of Sepsis diagnosis**  Time Code Sepsis Called/Page Received: @ 1327   Antibiotics Ordered: Vancomycin   Time of 1st antibiotic administration: @ 1355   Gardner Candle, PharmD, BCPS Clinical Pharmacist 05/31/2020 1:59 PM

## 2020-05-31 NOTE — ED Provider Notes (Signed)
[name] Emergency Department Provider Note  Time seen: 12:04 PM  I have reviewed the triage vital signs and the nursing notes.   HISTORY  Chief Complaint Leg Pain   HPI Stephen [name] is a 54 y.o. male with a past medical history of hypertension presents to the emergency department for left leg pain and swelling and weepage.  According to the patient approximately 5 days ago he developed left leg redness and swelling over the past 2 days he has had significant blistering and weepage several of the blisters have popped.  Patient states moderate pain as well to the left leg.  Denies any nausea vomiting.  Denies any fever.  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  Patient states he takes no prescription medications currently.   Past Medical History:  Diagnosis Date  . Hypertension     There are no problems to display for this patient.    Prior to Admission medications   Medication Sig Start Date End Date Taking? Authorizing Provider  diclofenac (VOLTAREN) 75 MG EC tablet Take 75 mg by mouth 2 (two) times daily.    [provider]  methocarbamol (ROBAXIN) 750 MG tablet Take 750 mg by mouth 4 (four) times daily.    [provider]    No Known Allergies  No family history on file.  Social History Social History   Tobacco Use  . Smoking status: Never Smoker  . Smokeless tobacco: Never Used  Vaping Use  . Vaping Use: Never used  Substance Use Topics  . Alcohol use: Yes  . Drug use: No    Review of Systems Constitutional: Negative for fever. Cardiovascular: Negative for chest pain. Respiratory: Negative for shortness of breath. Gastrointestinal: Negative for abdominal pain, vomiting Genitourinary: Negative for urinary compaints Musculoskeletal: Left leg redness swelling and weepage. Skin: Redness to the left leg with blistering. Neurological: Negative for headache All other ROS  negative  ____________________________________________   PHYSICAL EXAM:  VITAL SIGNS: ED Triage Vitals  Enc Vitals Group     BP 05/31/20 1003 126/75     Pulse Rate 05/31/20 1003 95     Resp 05/31/20 1003 20     Temp 05/31/20 1007 97.9 F (36.6 C)     Temp Source 05/31/20 1007 Oral     SpO2 05/31/20 1003 96 %     Weight 05/31/20 1004 300 lb (136.1 kg)     Height 05/31/20 1004 5\' 11"  (1.803 m)     Head Circumference --      Peak Flow --      Pain Score 05/31/20 1004 6     Pain Loc --      Pain Edu? --      Excl. in GC? --    Constitutional: Alert and oriented. Well appearing and in no distress. Eyes: Normal exam ENT      Head: Normocephalic and atraumatic.      Mouth/Throat: Mucous membranes are moist. Cardiovascular: Normal rate, regular rhythm.  Respiratory: Normal respiratory effort without tachypnea nor retractions. Breath sounds are clear  Gastrointestinal: Soft and nontender. No distention.  Musculoskeletal: Moderate left leg redness swelling and blistering.  Moderate tenderness to palpation.  Patient has multiple blisters throughout the leg some of which are quite large measuring up to 8 cm in diameter.  Considerable weepage. Neurologic:  Normal speech and language. No gross focal neurologic deficits Skin:  Skin is warm.  Erythematous with blistering  to left lower extremity from just above the knee and distally. Psychiatric: Mood and affect are normal.   ____________________________________________   RADIOLOGY  Ultrasound negative for DVT.  ____________________________________________   INITIAL IMPRESSION / ASSESSMENT AND PLAN / ED COURSE  Pertinent labs & imaging results that were available during my care of the patient were reviewed by me and considered in my medical decision making (see chart for details).   Patient presents emergency department for left leg swelling redness blistering pain and weepage.  Exam is very consistent with cellulitis however as  the patient is a truck driver and given the acuity of his symptoms we will also obtain an ultrasound to rule out DVT.  Patient's labs show a significant leukocytosis of 33,000, patient denies any history of leukocytosis previously known to him.  We will start the patient on IV vancomycin, send blood cultures.  Patient will require admission to the hospital service once his emergency department work-up is been completed.  Ultrasound negative for DVT.  Patient continued on IV vancomycin and will be admitted to the hospitalist service for further treatment.  Stephen [name] was evaluated in Emergency Department on 05/31/2020 for the symptoms described in the history of present illness. He was evaluated in the context of the global COVID-19 pandemic, which necessitated consideration that the patient might be at risk for infection with the SARS-CoV-2 virus that causes COVID-19. Institutional protocols and algorithms that pertain to the evaluation of patients at risk for COVID-19 are in a state of rapid change based on information released by regulatory bodies including the CDC and federal and state organizations. These policies and algorithms were followed during the patient's care in the ED.  ____________________________________________   FINAL CLINICAL IMPRESSION(S) / ED DIAGNOSES  Cellulitis   [name], MD 05/31/20 1535

## 2020-05-31 NOTE — ED Notes (Signed)
Formatting of this note might be different from the original.  Lt green, gray, purple, and one set of cultures sent to lab    Electronically signed by Bernadette Hoit, RN at 05/31/2020 10:20 AM EDT

## 2020-05-31 NOTE — ED Triage Notes (Signed)
Formatting of this note might be different from the original.  Pt via EMS c/o L leg pain, swelling, and weeping yellowish discharge. Pt denies hx of diabetes and CHF. Pt states it started a couple of days ago. Pt has large blister that busted at this time. Pt states it is painful to walk on. Pt is A&Ox4 and NAD.   Electronically signed by Bernadette Hoit, RN at 05/31/2020 11:30 AM EDT

## 2020-05-31 NOTE — ED Provider Notes (Signed)
Formatting of this note is different from the original.  [facility]  Emergency Department Provider Note    Time seen: 12:04 PM    I have reviewed the triage vital signs and the nursing notes.    HISTORY    Chief Complaint  Leg Pain    HPI  Thomas Browning is a 54 y.o. male with a past medical history of hypertension presents to the emergency department for left leg pain and swelling and weepage.  According to the patient approximately 5 days ago he developed left leg redness and swelling over the past 2 days he has had significant blistering and weepage several of the blisters have popped.  Patient states moderate pain as well to the left leg.  Denies any nausea vomiting.  Denies any fever.  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  Patient states he takes no prescription medications currently.     Past Medical History:   Diagnosis Date   ? Hypertension      There are no problems to display for this patient.    Prior to Admission medications    Medication Sig Start Date End Date Taking? Authorizing Provider   diclofenac (VOLTAREN) 75 MG EC tablet Take 75 mg by mouth 2 (two) times daily.    [provider]   methocarbamol (ROBAXIN) 750 MG tablet Take 750 mg by mouth 4 (four) times daily.    [provider]     No Known Allergies    No family history on file.    Social History  Social History     Tobacco Use   ? Smoking status: Never Smoker   ? Smokeless tobacco: Never Used   Vaping Use   ? Vaping Use: Never used   Substance Use Topics   ? Alcohol use: Yes   ? Drug use: No     Review of Systems  Constitutional: Negative for fever.  Cardiovascular: Negative for chest pain.  Respiratory: Negative for shortness of breath.  Gastrointestinal: Negative for abdominal pain, vomiting  Genitourinary: Negative for urinary compaints  Musculoskeletal: Left leg redness swelling and weepage.  Skin: Redness to the left leg with  blistering.  Neurological: Negative for headache  All other ROS negative    ____________________________________________    PHYSICAL EXAM:    VITAL SIGNS:  ED Triage Vitals   Enc Vitals Group      BP 05/31/20 1003 126/75      Pulse Rate 05/31/20 1003 95      Resp 05/31/20 1003 20      Temp 05/31/20 1007 97.9 F (36.6 C)      Temp Source 05/31/20 1007 Oral      SpO2 05/31/20 1003 96 %      Weight 05/31/20 1004 300 lb (136.1 kg)      Height 05/31/20 1004 5\' 11"  (1.803 m)      Head Circumference --       Peak Flow --       Pain Score 05/31/20 1004 6      Pain Loc --       Pain Edu? --       Excl. in GC? --      Constitutional: Alert and oriented. Well appearing and in no distress.  Eyes: Normal exam  ENT       Head: Normocephalic and atraumatic.       Mouth/Throat: Mucous membranes are moist.  Cardiovascular:  Normal rate, regular rhythm.   Respiratory: Normal respiratory effort without tachypnea nor retractions. Breath sounds are clear   Gastrointestinal: Soft and nontender. No distention.   Musculoskeletal: Moderate left leg redness swelling and blistering.  Moderate tenderness to palpation.  Patient has multiple blisters throughout the leg some of which are quite large measuring up to 8 cm in diameter.  Considerable weepage.  Neurologic:  Normal speech and language. No gross focal neurologic deficits  Skin:  Skin is warm.  Erythematous with blistering to left lower extremity from just above the knee and distally.  Psychiatric: Mood and affect are normal.     ____________________________________________    RADIOLOGY    Ultrasound negative for DVT.    ____________________________________________    INITIAL IMPRESSION / ASSESSMENT AND PLAN / ED COURSE    Pertinent labs & imaging results that were available during my care of the patient were reviewed by me and considered in my medical decision making (see chart for details).    Patient presents emergency department for left leg swelling redness blistering pain and  weepage.  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  Patient's labs show a significant leukocytosis of 33,000, patient denies any history of leukocytosis previously known to him.  We will start the patient on IV vancomycin, send blood cultures.  Patient will require admission to the hospital service once his emergency department work-up is been completed.    Ultrasound negative for DVT.  Patient continued on IV vancomycin and will be admitted to the hospitalist service for further treatment.    Thomas Browning was evaluated in Emergency Department on 05/31/2020 for the symptoms described in the history of present illness. He was evaluated in the context of the global COVID-19 pandemic, which necessitated consideration that the patient might be at risk for infection with the SARS-CoV-2 virus that causes COVID-19. Institutional protocols and algorithms that pertain to the evaluation of patients at risk for COVID-19 are in a state of rapid change based on information released by regulatory bodies including the CDC and federal and state organizations. These policies and algorithms were followed during the patient's care in the ED.    ____________________________________________    FINAL CLINICAL IMPRESSION(S) / ED DIAGNOSES    Cellulitis    [name], MD  05/31/20 1535    Electronically signed by [name], MD at 05/31/2020  3:35 PM EDT

## 2020-05-31 NOTE — Unmapped (Signed)
Formatting of this note might be different from the original.  WOC Nurse Consult Note:  Remote consult completed with aid of bedside RN, Tresa Endo.   Reason for Consult: cellulitis to left leg. Ruptured blister to left anterior and lateral leg  Wound type:infectious   Pressure Injury POA: NA  Measurement: bedside RN to perform.   Wound ZOX:WRUEAVWU blister with erythematous woundbed  Drainage (amount, consistency, odor) moderate weeping.   Periwound:edema, erythema and tenderness to left leg only.   Dressing procedure/placement/frequency: Cleanse left leg with NS and pat dry. Apply Xeroform to wound bed.  COver with dry gauze and kerlix and tape.  Change twice daily.    Will not follow at this time.  Please re-consult if needed.   Maple Hudson MSN, RN, FNP-BC CWON  Wound, Ostomy, Continence Nurse  Pager [phone]      Electronically signed by Alphonzo Dublin, FNP at 05/31/2020  2:03 PM EDT

## 2020-05-31 NOTE — Progress Notes (Signed)
Formatting of this note might be different from the original.  CODE SEPSIS - PHARMACY COMMUNICATION    **Broad Spectrum Antibiotics should be administered within 1 hour of Sepsis diagnosis**    Time Code Sepsis Called/Page Received: @ 1327     Antibiotics Ordered: Vancomycin     Time of 1st antibiotic administration: @ 1355     Gardner Candle, PharmD, BCPS  Clinical Pharmacist  05/31/2020  1:59 PM      Electronically signed by Gardner Candle, RPH at 05/31/2020  1:59 PM EDT

## 2020-05-31 NOTE — H&P (Signed)
Formatting of this note is different from the original.  Images from the original note were not included.    History and Physical     Thomas Browning ZOX:[id number] DOB: November 10, 1965 DOA: 05/31/2020    Referring MD/NP/PA:     PCP: Patient, No Pcp Per     Patient coming from:  The patient is coming from home.  At baseline, pt is independent for most of ADL.          Chief Complaint: left leg pain, swelling and weeping    HPI: Thomas Browning is a 54 y.o. male with medical history significant of HTN, who presents with left leg pain, swelling, weeping.    Patient states that his left leg pain started approximately 5 days ago, which has been progressively worsening.  Left leg is erythematous, swelling, with significant blistering, open wound and weeping from several popped blisters. The pain is constant, sharp, moderate to severe, nonradiating. 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 Patient denies fever or chills.  No chest pain, cough, shortness breath.  Denies nausea vomiting, diarrhea, abdominal pain, symptoms of UTI or unilateral weakness.    ED Course: pt was found to have WBC 33.3, lactic acid 1.7, pending COVID-19 PCR, AKI with creatinine 1.49, BUN 25 (no baseline creatinine available), temperature normal, blood pressure 126/75, tachycardia with heart rate of 100 --> 95, RR 20, oxygen saturation 96% on room air.  Left lower leg venous Doppler is negative for DVT.  Patient is placed on MedSurg bed for observation.    Review of Systems:     General: no fevers, chills, no body weight gain, has poor appetite, has fatigue  HEENT: no blurry vision, hearing changes or sore throat  Respiratory: no dyspnea, coughing, wheezing  CV: no chest pain, no palpitations  GI: no nausea, vomiting, abdominal pain, diarrhea, constipation  GU: no dysuria, burning on urination, increased urinary frequency, hematuria   Ext: has left leg pain, swelling, weeping, wound and blistering  Neuro: no unilateral weakness,  numbness, or tingling, no vision change or hearing loss  Skin: no rash.  Has open wound in the left lower leg  MSK: No muscle spasm, no deformity, no limitation of range of movement in spin  Heme: No easy bruising.   Travel history: No recent long distant travel.    Allergy: No Known Allergies    Past Medical History:   Diagnosis Date   ? Hypertension      Past Surgical History:   Procedure Laterality Date   ? TONSILLECTOMY       Social History:  reports that he has never smoked. He has never used smokeless tobacco. He reports current alcohol use. He reports that he does not use drugs.    Family History:   Family History   Problem Relation Age of Onset   ? Hypertension Mother        Prior to Admission medications    Medication Sig Start Date End Date Taking? Authorizing Provider   diclofenac (VOLTAREN) 75 MG EC tablet Take 75 mg by mouth 2 (two) times daily.    [provider]   methocarbamol (ROBAXIN) 750 MG tablet Take 750 mg by mouth 4 (four) times daily.    [provider]     Physical Exam:  Vitals:    05/31/20 1003 05/31/20 1004 05/31/20 1007   BP: 126/75     Pulse: 95     Resp: 20  Temp:   97.9 F (36.6 C)   TempSrc:   Oral   SpO2: 96%     Weight:  136.1 kg    Height:  5\' 11"  (1.803 m)      General: Not in acute distress  HEENT:        Eyes: PERRL, EOMI, no scleral icterus.        ENT: No discharge from the ears and nose, no pharynx injection, no tonsillar enlargement.         Neck: No JVD, no bruit, no mass felt.  Heme: No neck lymph node enlargement.  Cardiac: S1/S2, RRR, No murmurs, No gallops or rubs.  Respiratory:  No rales, wheezing, rhonchi or rubs.  GI: Soft, nondistended, nontender, no rebound pain, no organomegaly, BS present.  GU: No hematuria  Ext: 1+DP/PT pulse bilaterally.  Has erythema, tenderness, warmth, swelling in left leg from ankle up to the level above knee, with significant weeping, open wound and multiple blisters .    Musculoskeletal: No joint deformities, No  joint redness or warmth, no limitation of ROM in spin.  Skin: No rashes.   Neuro: Alert, oriented X3, cranial nerves II-XII grossly intact, moves all extremities normally.   Psych: Patient is not psychotic, no suicidal or hemocidal ideation.    Labs on Admission: I have personally reviewed following labs and imaging studies    CBC:  Recent Labs   Lab 05/31/20  1014   WBC 33.3*   NEUTROABS 25.8*   HGB 14.6   HCT 43.2   MCV 85.7   PLT 361     Basic Metabolic Panel:  Recent Labs   Lab 05/31/20  1014   NA 132*   K 3.4*   CL 98   CO2 22   GLUCOSE 129*   BUN 25*   CREATININE 1.49*   CALCIUM 8.3*     GFR:  Estimated Creatinine Clearance: 79.8 mL/min (A) (by C-G formula based on SCr of 1.49 mg/dL (H)).  Liver Function Tests:  Recent Labs   Lab 05/31/20  1014   AST 26   ALT 36   ALKPHOS 116   BILITOT 1.5*   PROT 8.1   ALBUMIN 3.0*     No results for input(s): LIPASE, AMYLASE in the last 168 hours.  No results for input(s): AMMONIA in the last 168 hours.  Coagulation Profile:  No results for input(s): INR, PROTIME in the last 168 hours.  Cardiac Enzymes:  No results for input(s): CKTOTAL, CKMB, CKMBINDEX, TROPONINI in the last 168 hours.  BNP (last 3 results)  No results for input(s): PROBNP in the last 8760 hours.  HbA1C:  No results for input(s): HGBA1C in the last 72 hours.  CBG:  No results for input(s): GLUCAP in the last 168 hours.  Lipid Profile:  No results for input(s): CHOL, HDL, LDLCALC, TRIG, CHOLHDL, LDLDIRECT in the last 72 hours.  Thyroid Function Tests:  No results for input(s): TSH, T4TOTAL, FREET4, T3FREE, THYROIDAB in the last 72 hours.  Anemia Panel:  No results for input(s): VITAMINB12, FOLATE, FERRITIN, TIBC, IRON, RETICCTPCT in the last 72 hours.  Urine analysis:  No results found for: COLORURINE, APPEARANCEUR, LABSPEC, PHURINE, GLUCOSEU, HGBUR, BILIRUBINUR, KETONESUR, PROTEINUR, UROBILINOGEN, NITRITE, LEUKOCYTESUR  Sepsis Labs:  @[email or web address] (procalcitonin:4,lacticidven:4)  )No results found for this or  any previous visit (from the past 240 hour(s)).     Radiological Exams on Admission:  US Venous Img Lower Unilateral Left    Result Date: 05/31/2020  CLINICAL DATA:  Edema and redness EXAM: LEFT LOWER EXTREMITY VENOUS DUPLEX ULTRASOUND TECHNIQUE: Gray-scale sonography with graded compression, as well as color Doppler and duplex ultrasound were performed to evaluate the left lower extremity deep venous system from the level of the common femoral vein and including the common femoral, femoral, profunda femoral, popliteal and calf veins including the posterior tibial, peroneal and gastrocnemius veins when visible. The superficial great saphenous vein was also interrogated. Spectral Doppler was utilized to evaluate flow at rest and with distal augmentation maneuvers in the common femoral, femoral and popliteal veins. COMPARISON:  None. FINDINGS: Contralateral Common Femoral Vein: Respiratory phasicity is normal and symmetric with the symptomatic side. No evidence of thrombus. Normal compressibility. Common Femoral Vein: No evidence of thrombus. Normal compressibility, respiratory phasicity and response to augmentation. Saphenofemoral Junction: No evidence of thrombus. Normal compressibility and flow on color Doppler imaging. Profunda Femoral Vein: No evidence of thrombus. Normal compressibility and flow on color Doppler imaging. Femoral Vein: No evidence of thrombus. Normal compressibility, respiratory phasicity and response to augmentation. Popliteal Vein: No evidence of thrombus. Normal compressibility, respiratory phasicity and response to augmentation. Calf Veins: No evidence of thrombus. Normal compressibility and flow on color Doppler imaging. Superficial Great Saphenous Vein: No evidence of thrombus. Normal compressibility. Venous Reflux:  None. Other Findings:  There is diffuse soft tissue edema. IMPRESSION: No evidence of deep venous thrombosis in the left lower extremity. Right common femoral vein also  present. Diffuse left lower extremity soft tissue edema noted. Electronically Signed   By: Bretta Bang III M.D.   On: 05/31/2020 12:48     EKG:   Not done in ED, will get one.     Assessment/Plan  Principal Problem:    Left leg cellulitis  Active Problems:    Hypertension    Sepsis (HCC)    Hypokalemia    AKI (acute kidney injury) (HCC)    Cellulitis of left leg    Sepsis due to left leg cellulitis: Patient meets criteria for sepsis with leukocytosis with WBC 33.3 and tachycardia with heart rate 100 -->95.  Lactate is normal, currently hemodynamically stable.  Left lower extremity venous Doppler is negative for DVT.     - Placed on MedSurg bed for observation  - Empiric antimicrobial treatment with vancomycin and Rocephin  - PRN Zofran for nausea, morphine and Percocet for pain  - Blood cultures x 2   - ESR and CRP  - wound care consult  - IVF: will give 2 L of NS bolus  -will get Procalcitonin     Hypertension: Blood pressure 126/75, patient not taking medications at home.  -IV hydralazine as needed    Hypokalemia: Potassium 3.4  -Pleated potassium  -Check magnesium level    AKI (acute kidney injury) [facility]): Creatinine 1.49, BUN 25, no baseline creatinine available, may be due to dehydration  -Avoid using renal toxic medications  -IV fluid as above  -Follow-up with BMP    DVT ppx: SQ Lovenox  Code Status: Full code  Family Communication: not done, no family member is at bed side.     Disposition Plan:  Anticipate discharge back to previous environment  Consults called:  none  Admission status: Med-surg bed for obs     Status is: Inpatient    Remains inpatient appropriate because:Inpatient level of care appropriate due to severity of illness.  Patient has history of hypertension, now presents with left leg cellulitis with very significant erythema, weeping, open wound, multiple blisters.  Patient meets criteria for  sepsis.  His presentation is highly complicated.  Patient is at high risk of deterioration.   Patient will need to be treated in hospital for at least 2 days.    Dispo: The patient is from: Home               Anticipated d/c is to: Home               Anticipated d/c date is: 2 days               Patient currently is not medically stable to d/c.    Date of Service 05/31/2020     Lorretta Harp  [facility]    If 7PM-7AM, please contact night-coverage  [email or web address]  05/31/2020, 2:06 PM    Electronically signed by Lorretta Harp, MD at 05/31/2020  2:06 PM EDT

## 2020-05-31 NOTE — Progress Notes (Signed)
Formatting of this note is different from the original.  Pharmacy Antibiotic Note    Thomas Browning is a 54 y.o. male admitted on 05/31/2020 with left leg cellulitis. Patient reported LLE pain started about 5 days go. According to admission note leg is swallow with significant blistering.  Pharmacy has been consulted for vancomycin dosing. Patient is also receiving ceftriaxone 2g IV every 24 hours.     Plan:  Vancomycin 1000mg  + 1500mg  IV ordered for a total loading dose of vancomycin 2500mg . Will start vancomycin 1000mg  IV every 12 hours.     Pharmacy will continue to monitor renal function and adjust dose as needed. Will plan to order vancomycin trough after 4-5 doses.     Height: 5\' 11"  (180.3 cm)  Weight: 136.1 kg (300 lb)  IBW/kg (Calculated) : 75.3    Temp (24hrs), Avg:97.9 F (36.6 C), Min:97.9 F (36.6 C), Max:97.9 F (36.6 C)    Recent Labs   Lab 05/31/20  1014   WBC 33.3*   CREATININE 1.49*   LATICACIDVEN 1.7     Estimated Creatinine Clearance: 79.8 mL/min (A) (by C-G formula based on SCr of 1.49 mg/dL (H)).      No Known Allergies    Antimicrobials this admission:  10/25 vancomycin  >>   10/25 ceftriaxone >>    Microbiology results:  10/25  BCx: pending    Thank you for allowing pharmacy to be a part of this patient?s care.    Gardner Candle, PharmD, BCPS  Clinical Pharmacist  05/31/2020  2:26 PM      Electronically signed by Gardner Candle, RPH at 05/31/2020  2:29 PM EDT

## 2020-05-31 NOTE — ED Triage Notes (Signed)
Formatting of this note might be different from the original.  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 198/112 , HR100, 97%RA,  97.7.  Electronically signed by Susanne Greenhouse, RN at 05/31/2020  9:49 AM EDT

## 2020-05-31 NOTE — ED Notes (Signed)
Formatting of this note might be different from the original.  This RN unable to obtain IV access at this time. 2nd RN will attempt. IV team consult placed   Electronically signed by Teressa Lower, RN at 05/31/2020 12:06 PM EDT

## 2020-06-01 ENCOUNTER — Inpatient Hospital Stay: Payer: Self-pay

## 2020-06-01 ENCOUNTER — Encounter: Payer: Self-pay | Admitting: Internal Medicine

## 2020-06-01 ENCOUNTER — Observation Stay: Payer: Self-pay

## 2020-06-01 LAB — BASIC METABOLIC PANEL
Anion gap: 10 (ref 5–15)
BUN: 20 mg/dL (ref 6–20)
CO2: 23 mmol/L (ref 22–32)
Calcium: 7.8 mg/dL — ABNORMAL LOW (ref 8.9–10.3)
Chloride: 98 mmol/L (ref 98–111)
Creatinine, Ser: 1.21 mg/dL (ref 0.61–1.24)
GFR, Estimated: >60 mL/min (ref >60)
Glucose, Bld: 169 mg/dL — ABNORMAL HIGH (ref 70–99)
Potassium: 3.6 mmol/L (ref 3.5–5.1)
Sodium: 131 mmol/L — ABNORMAL LOW (ref 135–145)

## 2020-06-01 LAB — CBC
HCT: 38.4 % — ABNORMAL LOW (ref 39.0–52.0)
Hemoglobin: 13 g/dL (ref 13.0–17.0)
MCH: 29.5 pg (ref 26.0–34.0)
MCHC: 33.9 g/dL (ref 30.0–36.0)
MCV: 87.1 fL (ref 80.0–100.0)
Platelets: 334 10*3/uL (ref 150–400)
RBC: 4.41 MIL/uL (ref 4.22–5.81)
RDW: 15 % (ref 11.5–15.5)
WBC: 31.4 10*3/uL — ABNORMAL HIGH (ref 4.0–10.5)
nRBC: 0 % (ref 0.0–0.2)

## 2020-06-01 LAB — HIV ANTIBODY (ROUTINE TESTING W REFLEX): HIV Screen 4th Generation wRfx: NONREACTIVE

## 2020-06-01 IMAGING — CR DG ABDOMEN 1V
2 series · 2 of 2 positions shown · non-contrast
Comparison: None.

CLINICAL DATA: Constipation

EXAM:
ABDOMEN - 1 VIEW

[abdomen kub (1 of 2)]
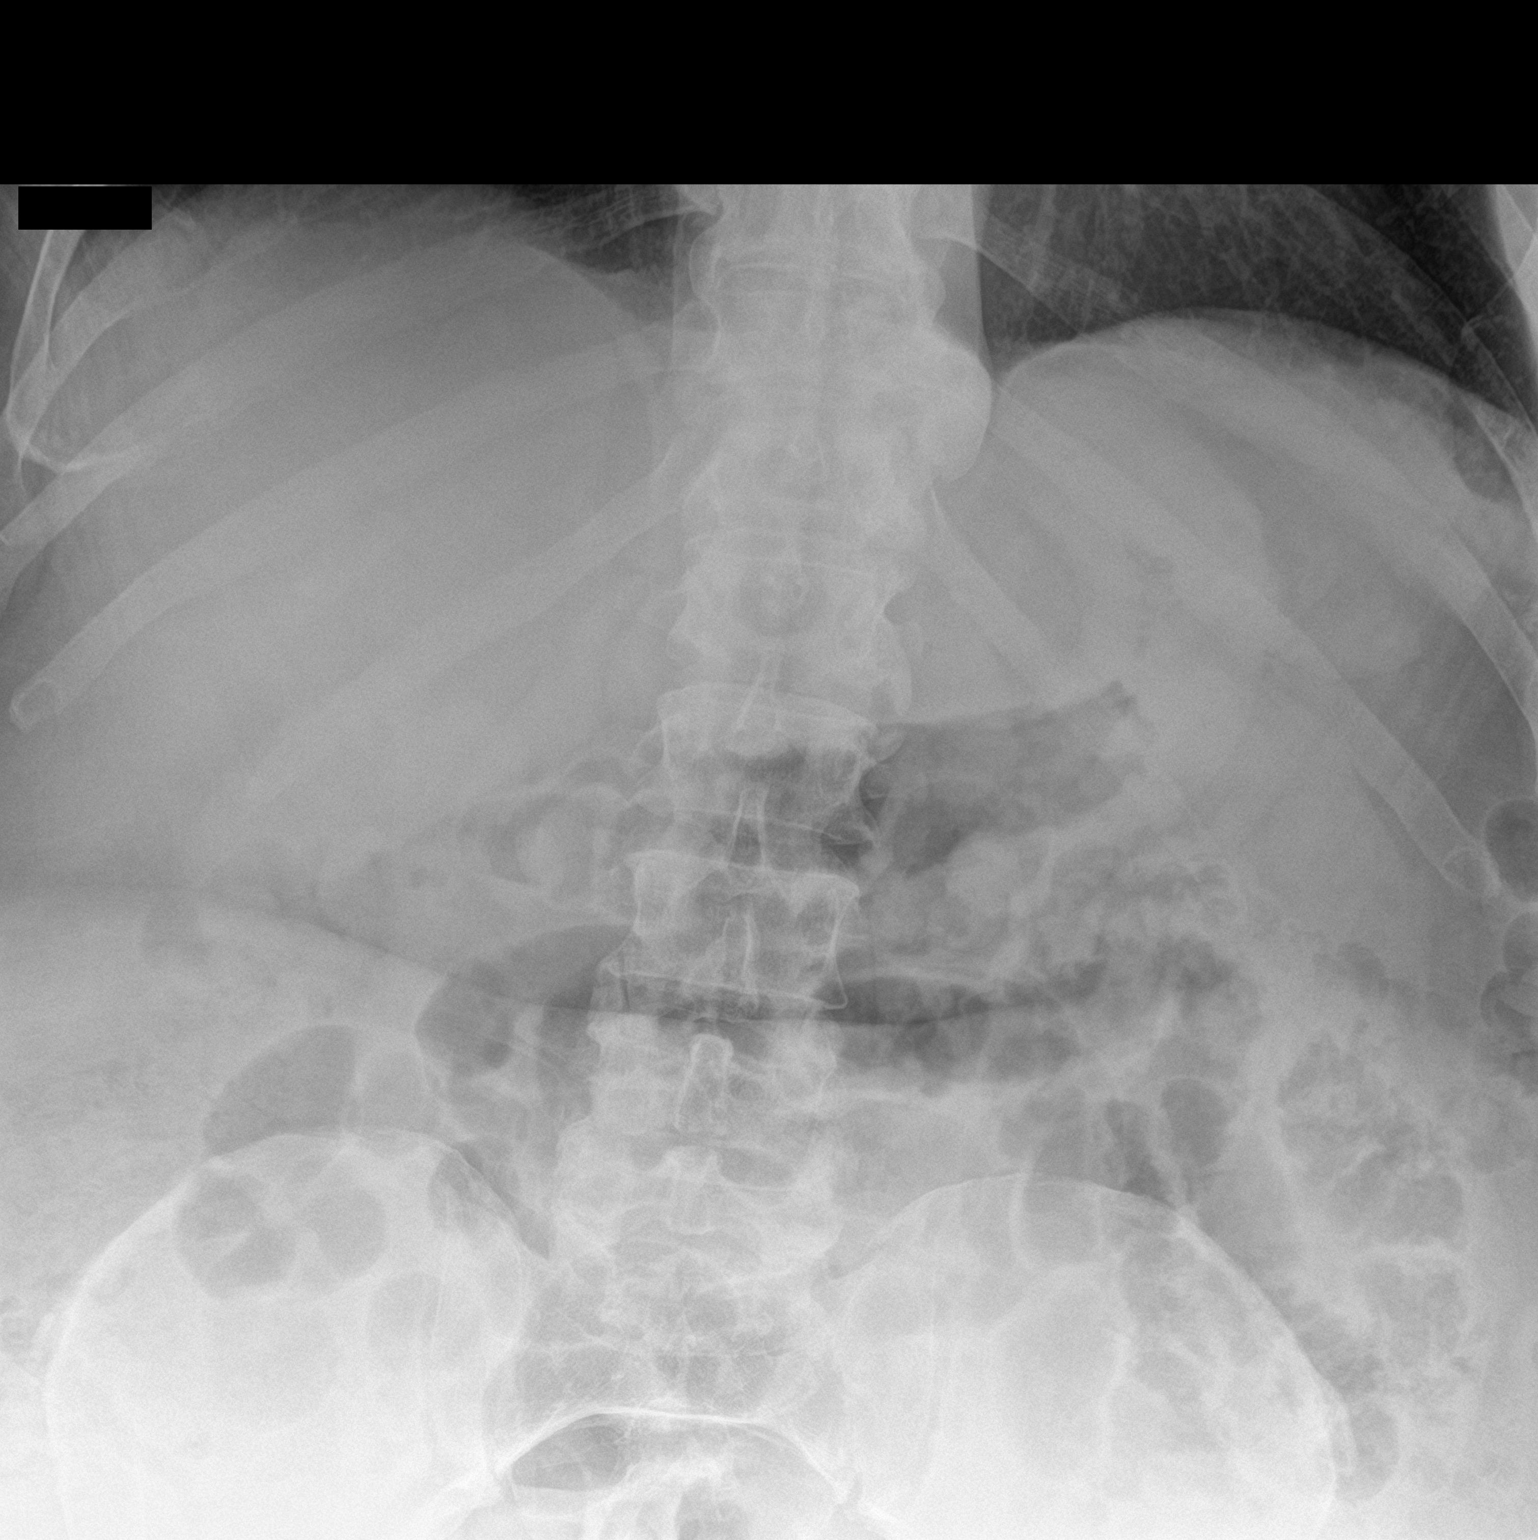

[abdomen kub (2 of 2)]
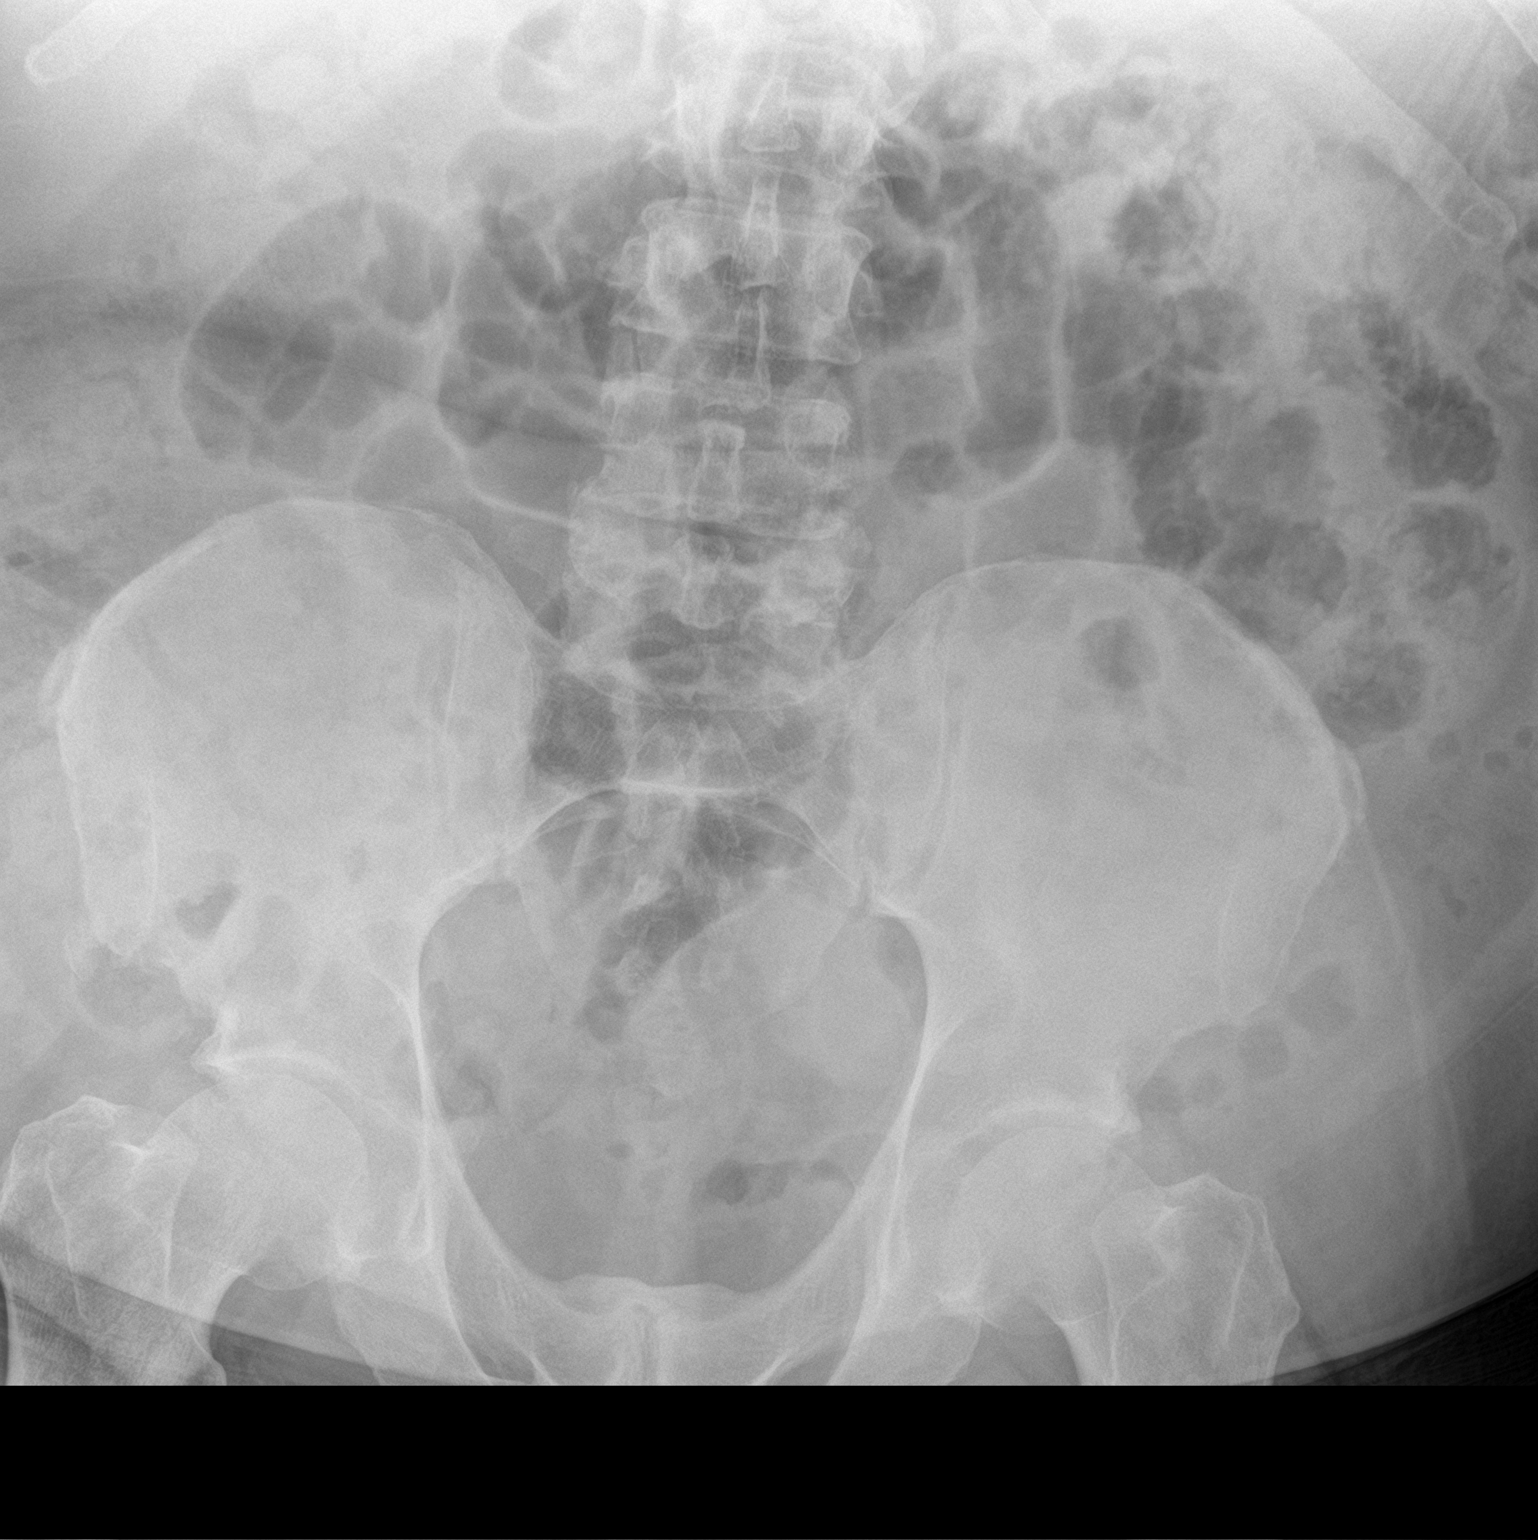

[2 of 2 positions shown; findings below may reference images not displayed]

FINDINGS: The bowel gas pattern is nonobstructive. Mild to moderate volume
stool throughout the colon. No radio-opaque calculi or other
significant radiographic abnormality are seen.
IMPRESSION: Nonobstructive bowel gas pattern. Mild to moderate volume stool
throughout the colon.

## 2020-06-01 IMAGING — CR DG CERVICAL SPINE 2 OR 3 VIEWS
4 series · 4 of 4 positions shown · non-contrast
Comparison: Radiographs [DATE]

CLINICAL DATA: Knot in the posterior aspect of the neck.

EXAM:
CERVICAL SPINE - 2-3 VIEW

[c-spine lat]
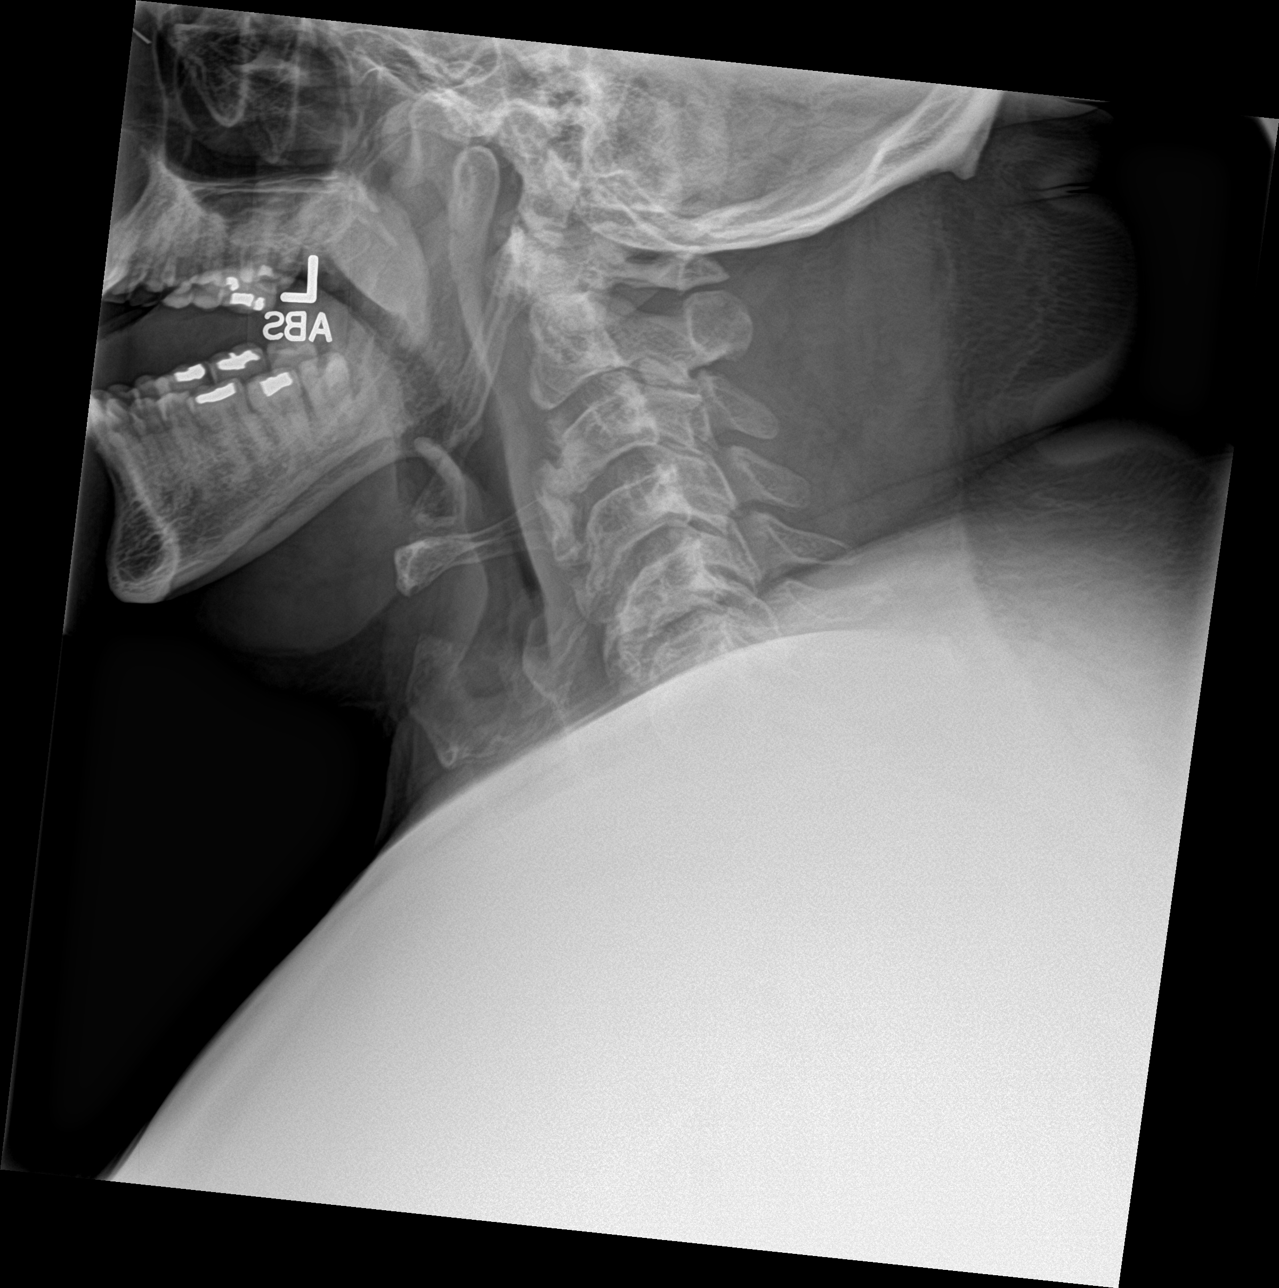

[c-spine ap]
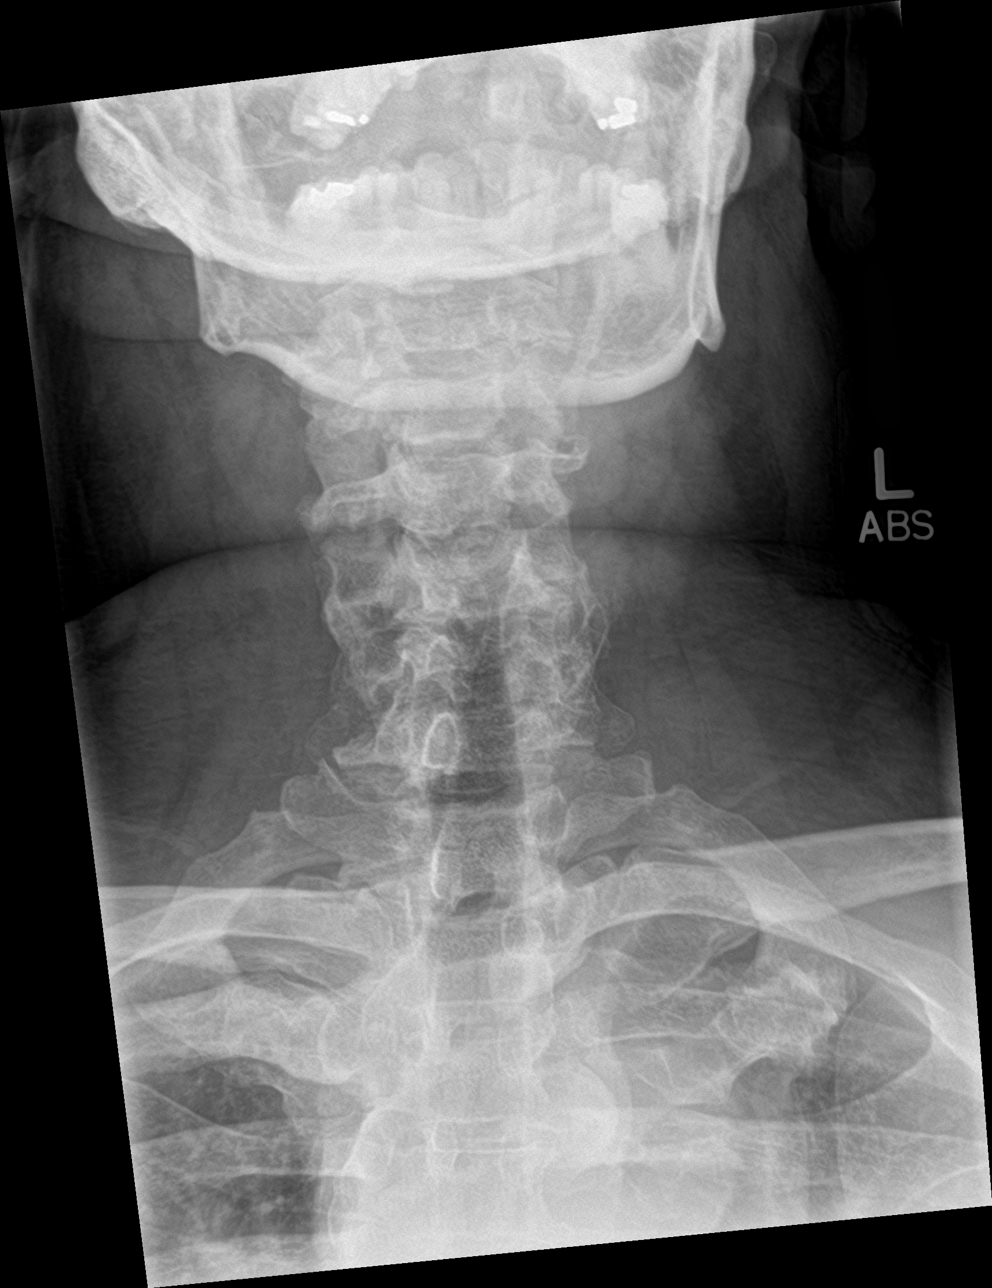

[c-spine open mouth]
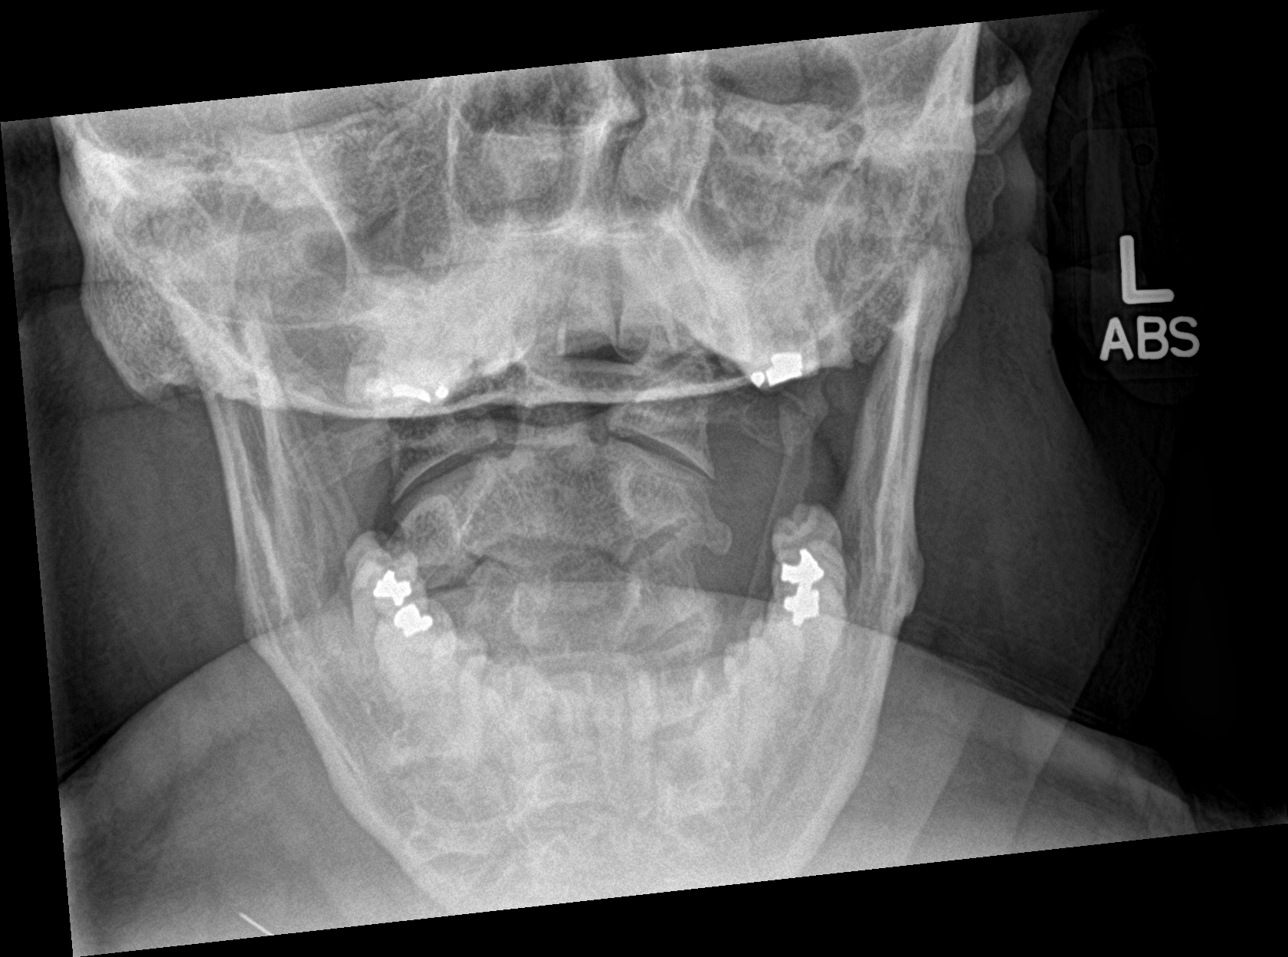

[c-spine swimmers]
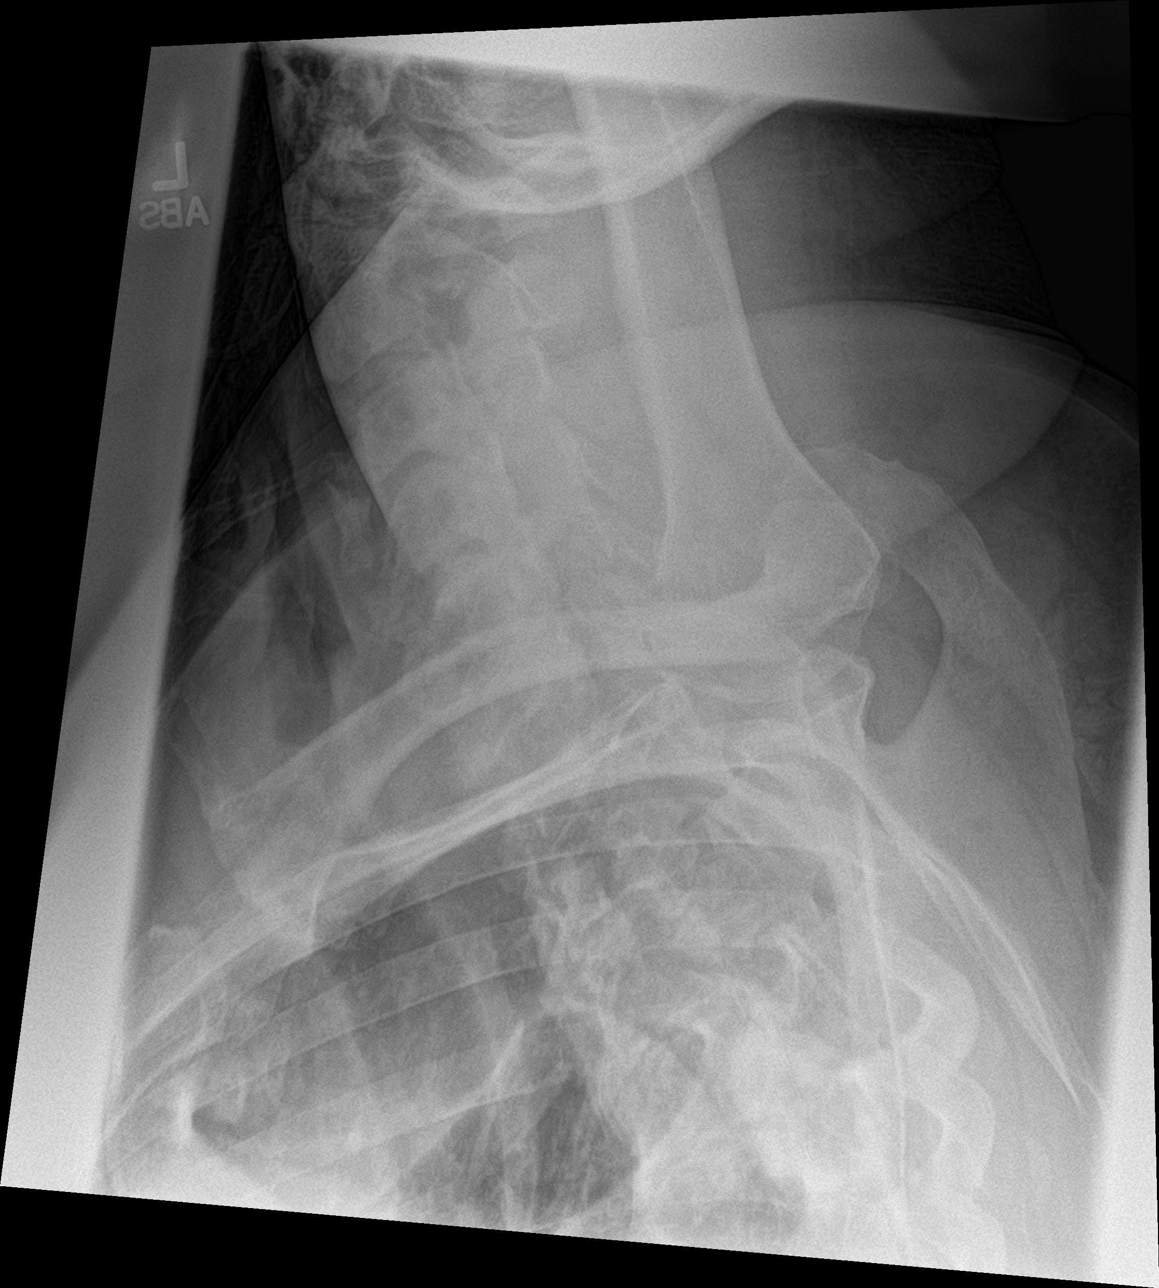

[4 of 4 positions shown; findings below may reference images not displayed]

FINDINGS: Limited evaluation from C6 inferiorly secondary to overlapping soft
tissues on the lateral radiograph. No evidence of acute fracture or
traumatic malalignment. Similar straightening of the normal cervical
lordosis. Similar moderate disc height loss and degenerative change
at C5-C6 and C6-C7. Similar large anteriorly directed osteophytes.
Similar long gated bilateral styloid processes. Prevertebral soft
tissues are within normal limits. Limited evaluation of the soft
tissues by radiograph.
IMPRESSION: No evidence of acute osseous abnormality. Similar moderate
degenerative changes, as detailed above.

## 2020-06-01 IMAGING — MR MR [PERSON_NAME] LOW WO/W CM*L*
6 of 9 series · 31 of 40 positions shown · IV contrast (gadavist)
Comparison: None.

CLINICAL DATA: Suspect osteomyelitis, diffuse cellulitis

EXAM:
MRI OF LOWER LEFT EXTREMITY WITHOUT AND WITH CONTRAST
TECHNIQUE: Multiplanar, multisequence MR imaging of the left was performed both
before and after administration of intravenous contrast.
CONTRAST:  10mL GADAVIST GADOBUTROL 1 MMOL/ML IV SOLN

[Series 7: t1_tse_cor_480_bilat_ · coronal · left · 4.0mm · 0.48mm/px · 2 of 40 slices shown]
[im 1/40]
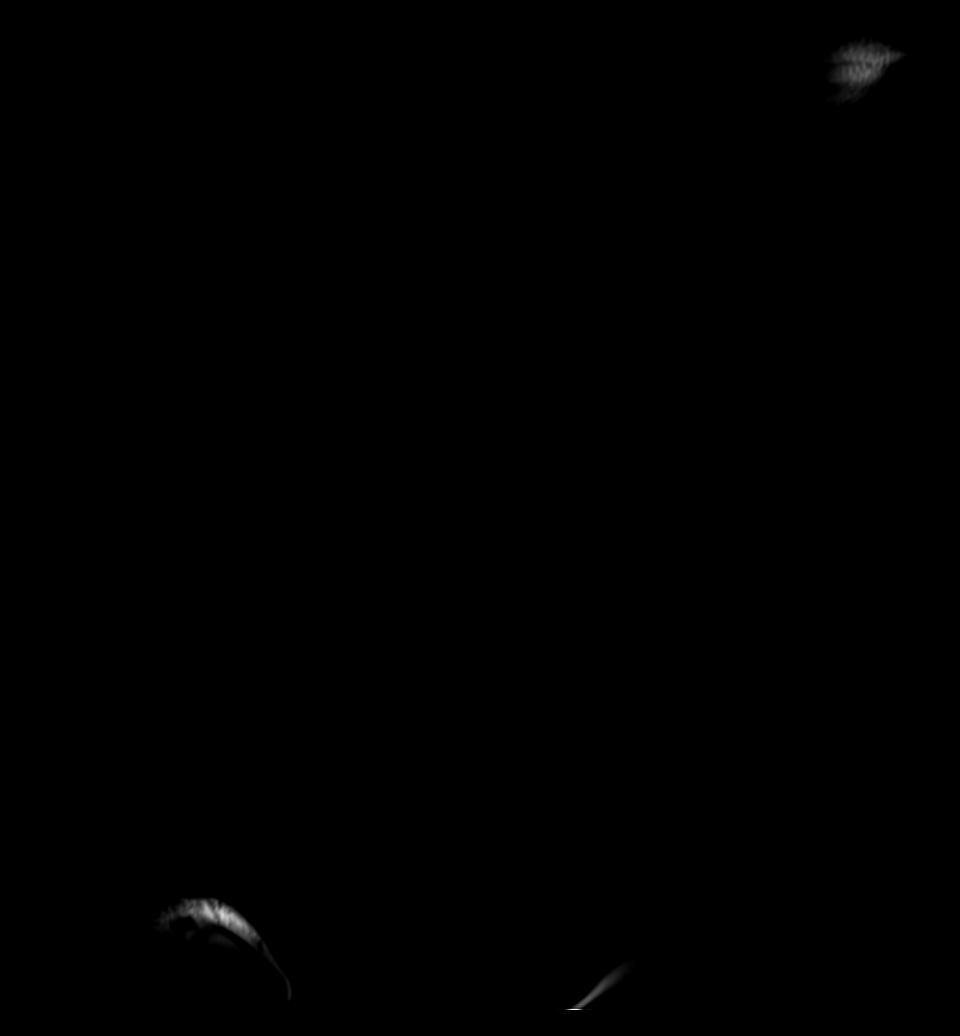
[im 40/40]
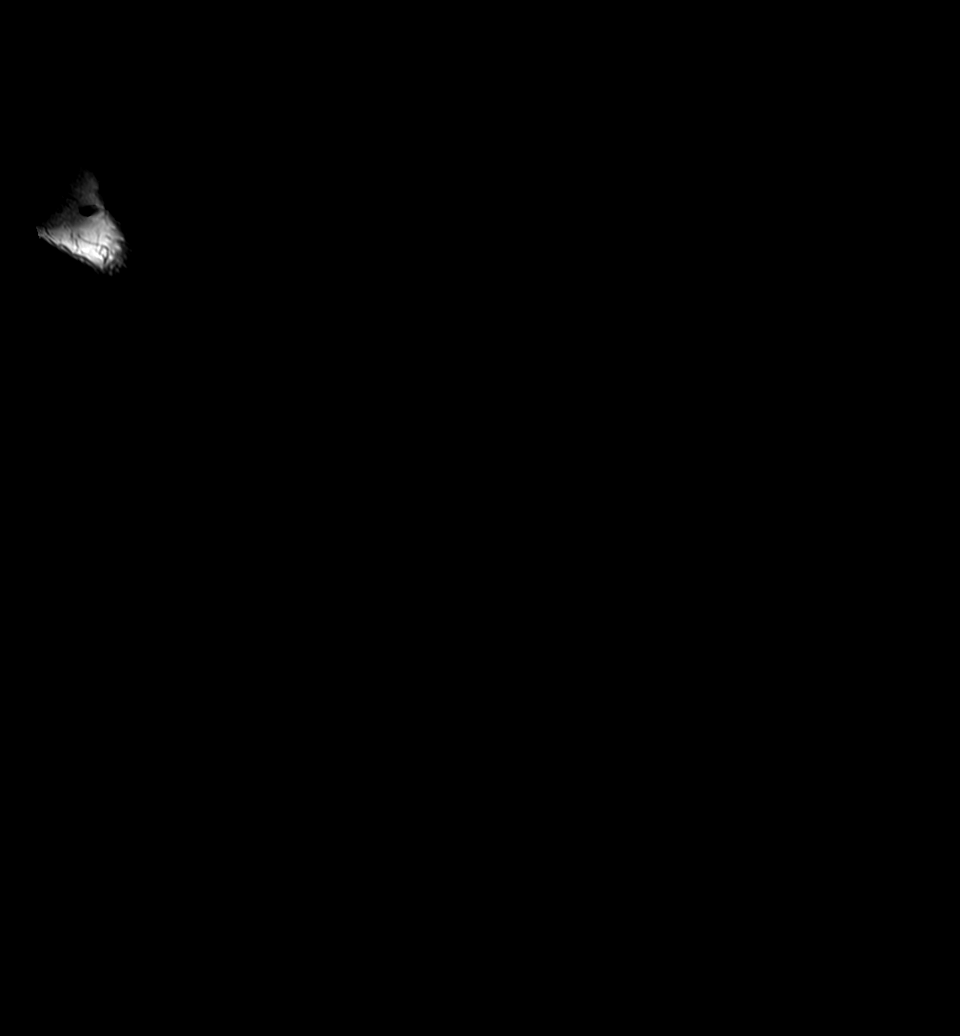

[Series 8: t2_tse_[id number]_352_bilat_ · coronal · left · 4.0mm · 0.66mm/px · 2 of 40 slices shown]
[im 1/40]
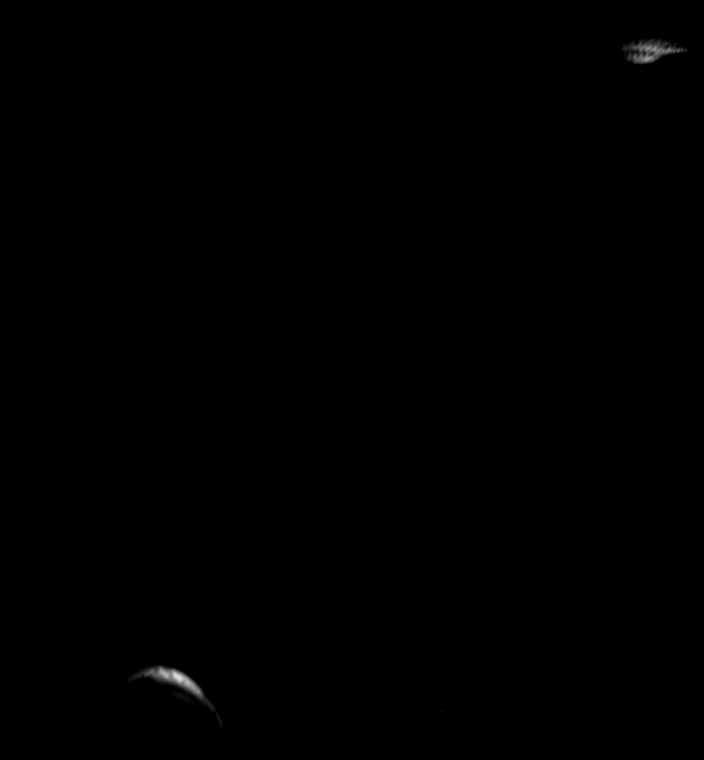
[im 40/40]
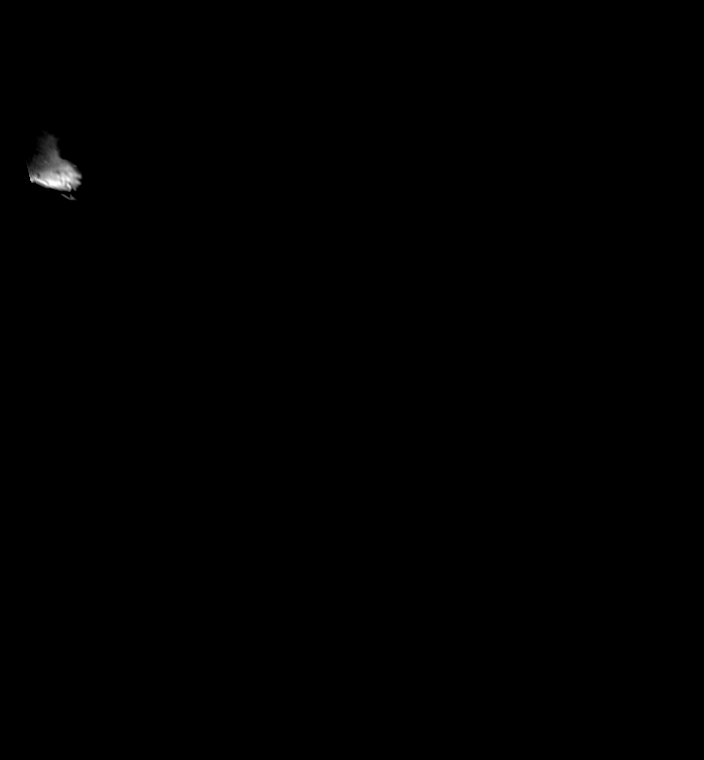

[Series 11: ax t1_comp_filt · axial · left · 4.0mm · 0.86mm/px · z∈[-265,+169]mm · 6 of 88 slices shown]
[im 1/88]
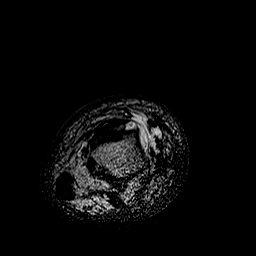
[im 18/88]
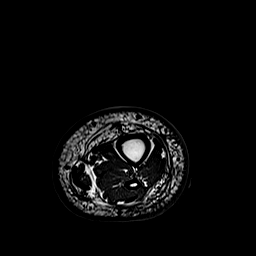
[im 35/88]
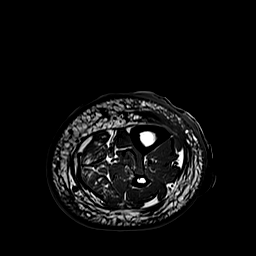
[im 53/88]
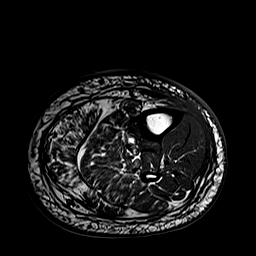
[im 70/88]
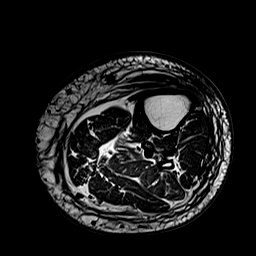
[im 88/88]
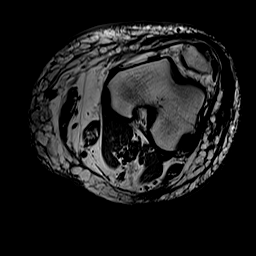

[Series 14: T2 · axial · left · 4.0mm · 0.81mm/px · z∈[-265,+169]mm · 7 of 88 slices shown]
[im 1/88]
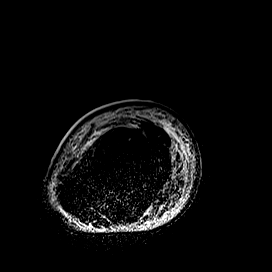
[im 15/88]
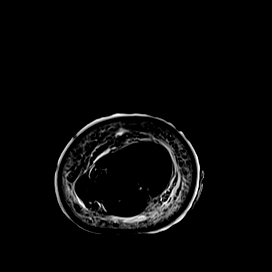
[im 30/88]
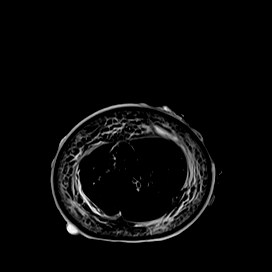
[im 44/88]
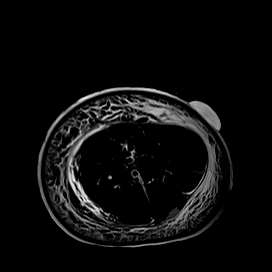
[im 59/88]
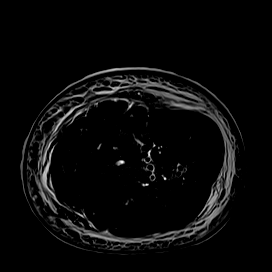
[im 73/88]
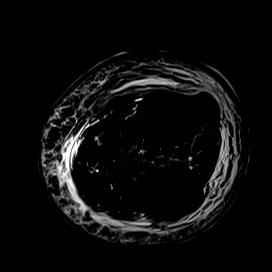
[im 88/88]
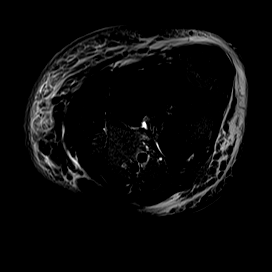

[Series 17: T1 fat-sat · axial · left · 4.0mm · 0.86mm/px · z∈[-265,+169]mm · 7 of 88 slices shown (1 of 2)]
[im 1/88]
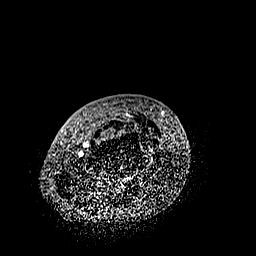
[im 15/88]
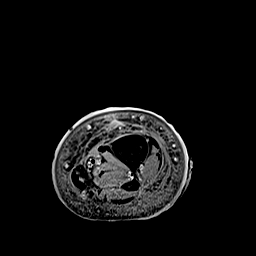
[im 30/88]
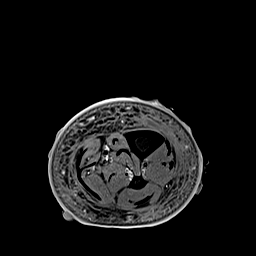
[im 44/88]
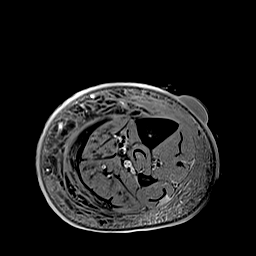
[im 59/88]
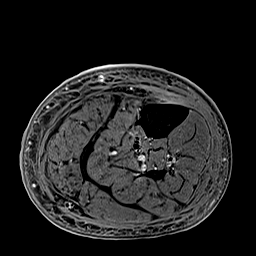
[im 73/88]
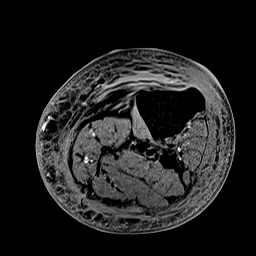
[im 88/88]
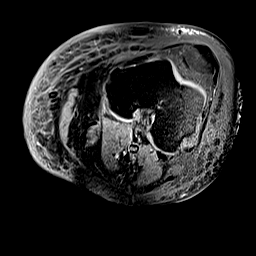

[Series 21: T1 fat-sat · axial · left · 4.0mm · 0.86mm/px · z∈[-265,+169]mm · 7 of 88 slices shown (2 of 2)]
[im 1/88]
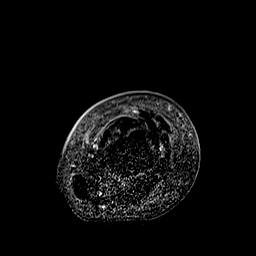
[im 15/88]
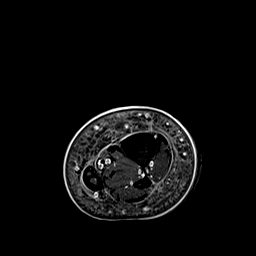
[im 30/88]
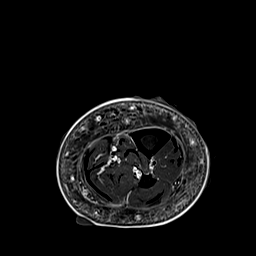
[im 44/88]
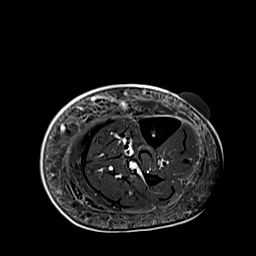
[im 59/88]
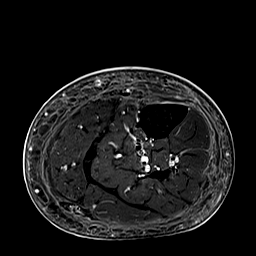
[im 73/88]
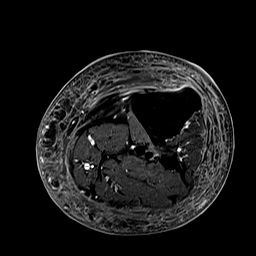
[im 88/88]
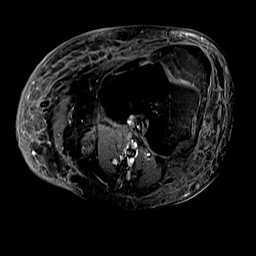

[31 of 40 positions shown; findings below may reference images not displayed]

FINDINGS: Bones/Joint/Cartilage

No areas of osseous destruction or periosteal reaction. Normal
osseous marrow signal seen throughout. There tibial surfaces appear
to be maintained.

Ligaments

Suboptimally visualized

Muscles and Tendons

The muscles surrounding the lower extremity are intact without focal
atrophy or tear. The visualized portions of the tendons are intact.

Soft tissues

There is diffuse skin thickening with edema and subcutaneous edema
extending to the deep fascial layers. There is non loculated fluid
within the deep fascial layers. Overlying superficial epidermal
cyst/boils are seen.
IMPRESSION: Findings of diffuse cellulitis with superficial cyst. Non loculated
fluid within the fascial layers. No evidence of osteomyelitis or
abscess.

## 2020-06-01 MED ORDER — TRAZODONE HCL 50 MG PO TABS
50.0000 mg | ORAL_TABLET | Freq: Every evening | ORAL | Status: DC | PRN
  Administered 2020-06-01 – 2020-06-06 (×6): 50 mg via ORAL
  Filled 2020-06-01 (×7): qty 1

## 2020-06-01 MED ORDER — METHOCARBAMOL 1000 MG/10ML IJ SOLN
500.0000 mg | Freq: Three times a day (TID) | INTRAVENOUS | Status: DC | PRN
  Filled 2020-06-01: qty 5

## 2020-06-01 MED ORDER — SENNA 8.6 MG PO TABS: 2.0000 | ORAL_TABLET | Freq: Every day | ORAL | Status: DC

## 2020-06-01 MED ORDER — SENNOSIDES-DOCUSATE SODIUM 8.6-50 MG PO TABS
1.0000 | ORAL_TABLET | Freq: Two times a day (BID) | ORAL | Status: DC
  Administered 2020-06-01 – 2020-06-15 (×27): 1 via ORAL
  Filled 2020-06-01 (×29): qty 1

## 2020-06-01 MED ORDER — LACTULOSE 10 GM/15ML PO SOLN
20.0000 g | Freq: Every day | ORAL | Status: DC
  Administered 2020-06-01 – 2020-06-02 (×2): 20 g via ORAL
  Filled 2020-06-01 (×2): qty 30

## 2020-06-01 MED ORDER — LIDOCAINE 5 % EX PTCH
1.0000 | MEDICATED_PATCH | CUTANEOUS | Status: DC
  Administered 2020-06-01 – 2020-06-15 (×15): 1 via TRANSDERMAL
  Filled 2020-06-01 (×16): qty 1

## 2020-06-01 MED ORDER — GADOBUTROL 1 MMOL/ML IV SOLN
10.0000 mL | Freq: Once | INTRAVENOUS | Status: AC | PRN
  Administered 2020-06-01: 10 mL via INTRAVENOUS

## 2020-06-01 MED ORDER — BISACODYL 10 MG RE SUPP
10.0000 mg | Freq: Every day | RECTAL | Status: DC | PRN
  Administered 2020-06-01: 10 mg via RECTAL
  Filled 2020-06-01: qty 1

## 2020-06-01 MED ORDER — ENOXAPARIN SODIUM 80 MG/0.8ML ~~LOC~~ SOLN
0.5000 mg/kg | SUBCUTANEOUS | Status: DC
  Administered 2020-06-01 – 2020-06-14 (×14): 67.5 mg via SUBCUTANEOUS
  Filled 2020-06-01 (×15): qty 0.8

## 2020-06-01 MED ORDER — MAGNESIUM HYDROXIDE 400 MG/5ML PO SUSP
30.0000 mL | Freq: Every day | ORAL | Status: DC | PRN
  Filled 2020-06-01: qty 30

## 2020-06-01 MED ORDER — MAGNESIUM CITRATE PO SOLN
1.0000 | Freq: Once | ORAL | Status: DC
  Filled 2020-06-01: qty 296

## 2020-06-01 MED ORDER — ACETAMINOPHEN 325 MG PO TABS
650.0000 mg | ORAL_TABLET | Freq: Four times a day (QID) | ORAL | Status: DC | PRN
  Administered 2020-06-04 – 2020-06-12 (×5): 650 mg via ORAL
  Filled 2020-06-01 (×5): qty 2

## 2020-06-01 MED ORDER — CLINDAMYCIN PHOSPHATE 900 MG/50ML IV SOLN
900.0000 mg | Freq: Three times a day (TID) | INTRAVENOUS | Status: DC
  Administered 2020-06-01 – 2020-06-02 (×3): 900 mg via INTRAVENOUS
  Filled 2020-06-01 (×6): qty 50

## 2020-06-01 MED ORDER — LACTATED RINGERS IV SOLN: INTRAVENOUS | Status: DC

## 2020-06-01 MED ORDER — KETOROLAC TROMETHAMINE 15 MG/ML IJ SOLN
15.0000 mg | Freq: Four times a day (QID) | INTRAMUSCULAR | Status: DC
  Administered 2020-06-01 – 2020-06-02 (×3): 15 mg via INTRAVENOUS
  Filled 2020-06-01 (×4): qty 1

## 2020-06-01 NOTE — Evaluation (Addendum)
Occupational Therapy Evaluation Patient Details Name: Stephen [name] MRN: [id number] DOB: [birth year] Today's Date: 06/01/2020    History of Present Illness 54 y.o. male with medical history significant of HTN, who presents with left leg pain, swelling, weeping. Patient states that his left leg pain started approximately 5 days ago, which has been progressively worsening.  Left leg is erythematous, swelling, with significant blistering, open wound and weeping from several popped blisters. The pain is constant, sharp, moderate to severe, nonradiating. 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 Ultrasound on admission negative.   Clinical Impression   Pt was seen for OT evaluation this date.  MRI LLE pending, RN cleared OT to work with pt. Pt tearful at start of session and expressed concern over what is happening to his leg. Active listening and emotional support provided and pt expressed appreciation. Pt politely declined offer to involve chaplain. 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 Pt presents with impairments in balance, LLE pain, open wounds/blistering to LLE, activity tolerance, and safety resulting in the need for SBA for ADL mobility (Pt's balance much improved holding onto IV pole versus 2WW) and STS LB ADL tasks. Pt ambulated to bathroom, remote supervision for toileting task. Pt sat EOB to perform LB dressing and appreciative of opportunity to get out of "wet clothes" 2/2 LLE seeping fluids. Bed changed and fresh chucks placed on pt's bed. LLE elevated with pillow. Pt would benefit from skilled OT services while here to address noted impairments and functional limitations (see below for any additional details) in order to maximize safety and independence while minimizing falls risk and caregiver burden. Upon hospital discharge, do not anticipate need for skilled OT services at this time.  Will continue to assess as pt progresses with hospitalization.     Follow Up Recommendations  No OT follow up    Equipment Recommendations  None recommended by OT    Recommendations for Other Services       Precautions / Restrictions Precautions Precautions: Fall Precaution Comments: LLE wound Restrictions Weight Bearing Restrictions: No      Mobility Bed Mobility Overal bed mobility: Needs Assistance Bed Mobility: Sit to Supine;Supine to Sit     Supine to sit: Min guard;HOB elevated Sit to supine: Supervision        Transfers Overall transfer level: Needs assistance Equipment used: Rolling walker (2 wheeled);None Transfers: Sit to/from Stand Sit to Stand: Supervision;Min guard         General transfer comment: increased effort to perform    Balance Overall balance assessment: Needs assistance Sitting-balance support: No upper extremity supported;Feet supported Sitting balance-Leahy Scale: Good     Standing balance support: Bilateral upper extremity supported;Single extremity supported Standing balance-Leahy Scale: Fair Standing balance comment: balance worse when attempting to use RW, improved when using IV pole                           ADL either performed or assessed with clinical judgement   ADL Overall ADL's : Needs assistance/impaired Eating/Feeding: Independent   Grooming: Wash/dry hands;Supervision/safety;Standing Grooming Details (indicate cue type and reason): remote supervision             Lower Body Dressing: Sit to/from stand;Supervision/safety;Set up   Toilet Transfer: Regular Toilet;Supervision/safety Toilet Transfer Details (indicate cue type and reason): SBA Toileting- Clothing Manipulation and Hygiene: Independent  Functional mobility during ADLs: Supervision/safety General ADL Comments: pt initially trialing RW and with limited WBing through L heel 2/2 pain, ambulated with improved balance and WBing with UE  support on IV pole     Vision Patient Visual Report: No change from baseline       Perception     Praxis      Pertinent Vitals/Pain Pain Assessment: 0-10 Pain Score: 6  Pain Location: LLE Pain Descriptors / Indicators: Aching;Throbbing;Sharp Pain Intervention(s): Limited activity within patient's tolerance;Monitored during session;Patient requesting pain meds-RN notified;Repositioned;RN gave pain meds during session     Hand Dominance Right   Extremity/Trunk Assessment Upper Extremity Assessment Upper Extremity Assessment: Overall WFL for tasks assessed   Lower Extremity Assessment Lower Extremity Assessment: Defer to PT evaluation;LLE deficits/detail LLE Deficits / Details: at least 4/5, pain limited, open blisters/wounds, leg is red, swollen, hot to touch but pt reports improving LLE: Unable to fully assess due to pain       Communication Communication Communication: No difficulties   Cognition Arousal/Alertness: Awake/alert Behavior During Therapy: WFL for tasks assessed/performed Overall Cognitive Status: Within Functional Limits for tasks assessed                                     General Comments  LLE red, blistered, edematous, seeping, red and hot to touch; elevated with pillow, chucks replaced underneath to collect fluid    Exercises Other Exercises Other Exercises: Pt instructed in falls prevention, LE positioning for improved comfort and skin integrity Other Exercises: Pt amb to bathroom for toileting with supervision/SBA, improved balance using IV pole for UE support versus RW Other Exercises: Pt tearful at start of session, encouragement, active listening, and emotional support provided; pt expressed appreciation   Shoulder Instructions      Home Living Family/patient expects to be discharged to:: Private residence Living Arrangements: Parent (pt's mother lives with him) Available Help at Discharge: Family Type of Home: House              Bathroom Shower/Tub: Engineer, civil (consulting) Toilet: Standard     Home Equipment: None          Prior Functioning/Environment Level of Independence: Independent        Comments: Pt independent, works as a Engineer, 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, 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); denies falls        OT Problem List: Pain;Increased edema;Decreased knowledge of use of DME or AE;Obesity;Impaired balance (sitting and/or standing);Decreased activity tolerance      OT Treatment/Interventions: Self-care/ADL training;Therapeutic exercise;Therapeutic activities;DME and/or AE instruction;Balance training;Patient/family education    OT Goals(Current goals can be found in the care plan section) Acute Rehab OT Goals Patient Stated Goal: to understand what's happening to his leg and return to PLOF OT Goal Formulation: With patient Time For Goal Achievement: 06/15/20 Potential to Achieve Goals: Good ADL Goals Pt Will Transfer to Toilet: with modified independence;ambulating;regular height toilet (no LOB, LRAD for ambulation) Additional ADL Goal #1: Pt will perform UB/LB dressing with modified independence, AE as needed with no LOB during ADL transfers. Additional ADL Goal #2: Pt will perform UB/LB bathing with modified independence, AE as needed with no LOB during ADL transfers.  OT Frequency: Min 1X/week   Barriers to D/C:            Co-evaluation  AM-PAC OT "6 Clicks" Daily Activity     Outcome Measure Help from another person eating meals?: None Help from another person taking care of personal grooming?: None Help from another person toileting, which includes using toliet, bedpan, or urinal?: A Little Help from another person bathing (including washing, rinsing, drying)?: A Little Help from another person to put on and taking off regular upper body clothing?: None Help from another person to put on and taking off regular lower body  clothing?: A Little 6 Click Score: 21   End of Session Nurse Communication: Mobility status  Activity Tolerance: Patient tolerated treatment well Patient left: in bed;with call bell/phone within reach  OT Visit Diagnosis: Other abnormalities of gait and mobility (R26.89);Pain Pain - Right/Left: Left Pain - part of body: Leg                Time: 1530-1602 OT Time Calculation (min): 32 min Charges:  OT General Charges $OT Visit: 1 Visit OT Evaluation $OT Eval Moderate Complexity: 1 Mod OT Treatments $Self Care/Home Management : 8-22 mins  Richrd Prime, MPH, MS, OTR/L ascom [phone] 06/01/20, 4:50 PM

## 2020-06-01 NOTE — Progress Notes (Signed)
OT Cancellation Note  Patient Details Name: ELVYN KROHN MRN: [id number] DOB: 10/17/1965   Cancelled Treatment:    Reason Eval/Treat Not Completed: Other (comment). Consult received, chart reviewed. Per chart, pt pending MRI "to rule out deep tissue infection versus osteomyelitis" and "possible surgical consultation depending on results of MRI." Will hold OT evaluation at this time and re-attempt pending imaging and updated plan of care.   Richrd Prime, MPH, MS, OTR/L ascom [phone] 06/01/20, 2:15 PM

## 2020-06-01 NOTE — Progress Notes (Signed)
Pt up and down to bathroom several time and stated " I feel constipated". NP in the unite spoke to her received some new orders for MOM and took MOM pt and he refused it, Gave pt a cup of Prune Juice and he refused also.  I asked what next we can do to help he " MY job is to com palin to you" . Pt was educated and encourage to take medication as ordered. Pt was also encouraged to increase fluid intake and ambulating could help his bowel moved. He said " I will talk it over with my Dr in AM".

## 2020-06-01 NOTE — Progress Notes (Signed)
PHARMACIST - PHYSICIAN COMMUNICATION  CONCERNING:  Enoxaparin (Lovenox) for DVT Prophylaxis    RECOMMENDATION: Patient was prescribed enoxaparin 40 mg q24 hours for VTE prophylaxis.   Filed Weights   05/31/20 1004  Weight: 136.1 kg (300 lb)    Body mass index is 41.84 kg/m.  Estimated Creatinine Clearance: 79.8 mL/min (A) (by C-G formula based on SCr of 1.49 mg/dL (H)).   Based on [facility] policy patient is candidate for enoxaparin 0.5mg /kg TBW SQ every 24 hours based on BMI being >30.   DESCRIPTION: Pharmacy has adjusted enoxaparin dose per [facility] policy.  Patient is now receiving enoxaparin 67.5 mg every 24 hours    Pricilla Riffle, PharmD Clinical Pharmacist  06/01/2020 3:35 AM

## 2020-06-01 NOTE — Progress Notes (Signed)
Received report from Matthew, RN. Assuming care of patient at this time.  

## 2020-06-01 NOTE — Progress Notes (Signed)
PROGRESS NOTE    Stephen BachRodney Q [name]  XBJ:[id number] DOB: 16-Oct-1965 DOA: 05/31/2020 PCP: Patient, No Pcp Per  Brief Narrative:  54 y.o. male with medical history significant of HTN, who presents with left leg pain, swelling, weeping.  Patient states that his left leg pain started approximately 5 days ago, which has been progressively worsening.  Left leg is erythematous, swelling, with significant blistering, open wound and weeping from several popped blisters. The pain is constant, sharp, moderate to severe, nonradiating. 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  Ultrasound on admission negative.  Patient presented with left leg complaints also complains of constipation neck pain.  Started on broad-spectrum IV antibiotics on admission.  Patient's main complaints are constipation and neck pain.  KUB negative for obstruction.  X-ray C-spine negative for fracture.       Assessment & Plan:   Principal Problem:   Left leg cellulitis Active Problems:   Hypertension   Sepsis (HCC)   Hypokalemia   AKI (acute kidney injury) (HCC)   Cellulitis of left leg  Sepsis due to left leg cellulitis:  Patient meets criteria for sepsis with leukocytosis with WBC 33.3 and tachycardia with heart rate 100 -->95.   Lactate is normal, currently hemodynamically stable.  Left lower extremity venous Doppler is negative for DVT Marked leukocytosis Elevated inflammatory markers Noted AKI Mild hyponatremia Wound consult performed remotely LRINEC score 3, below threshold of six for necrotizing fasciitis Plan: Continue vancomycin, pharmacy to dose Continue Rocephin 2 g every 24 hours Add clindamycin 900 mg IV every 8 hours Intravenous fluids Follow blood cultures Check MRI left lower extremity rule out osteomyelitis versus necrotizing fasciitis  Constipation Patient states he is chronically constipated and takes multiple laxatives with minimal relief Likely owing to dietary  indiscretions considering his occupation as a truck driver No obstruction or ileus noted on imaging Moderate stool burden Plan: Bowel regimen Can pursue enema if needed  Hypertension Blood pressure 126/75, patient not taking medications at home. -IV hydralazine as needed  Hypokalemia: -Check potassium daily, replete as necessary -Following replace magnesium  AKI (acute kidney injury) (HCC) Creatinine 1.49, BUN 25, no baseline creatinine available,  may be due to dehydration versus sepsis Plan: Intravenous fluids Avoid nephrotoxins Daily BMP  Cervicalgia Patient is a truck driver Exam significant for rigid lump in posterior neck Noted to soft tissue on x-ray C-spine x-ray negative for fracture Plan: Multimodal pain control Lidoderm patch Physical therapy  Morbid obesity BMI 41.8 This complicates care and prognosis   DVT prophylaxis: Lovenox Code Status: Full Family Communication: None today Disposition Plan: Status is: Inpatient  Remains inpatient appropriate because:Inpatient level of care appropriate due to severity of illness   Dispo: The patient is from: Home              Anticipated d/c is to: Home              Anticipated d/c date is: 3 days              Patient currently is not medically stable to d/c.  Sepsis secondary to cellulitis.  MRI to rule out deep tissue infection versus osteomyelitis.  Possible surgical consultation depending on results of MRI.  Treating constipation with multimodal bowel regimen.  Treating cervicalgia with multimodal pain regimen.  Consultants:   None  Procedures:   None  Antimicrobials:   Vancomycin  Ceftriaxone  Clindamycin   Subjective: Seen and examined.  Endorses constipation, neck pain, pain in  left lower extremity  Objective: Vitals:   05/31/20 1909 05/31/20 2338 06/01/20 0730 06/01/20 1157  BP: (!) 153/94 (!) 161/90 (!) 145/88 (!) 144/78  Pulse: 92 99 97 97  Resp: 17 16 18 18   Temp: 98.4 F  (36.9 C) 98.7 F (37.1 C) 98.5 F (36.9 C) 98.4 F (36.9 C)  TempSrc: Oral Oral Oral   SpO2: 100% 95% 100% 98%  Weight:      Height:        Intake/Output Summary (Last 24 hours) at 06/01/2020 1340 Last data filed at 06/01/2020 1016 Gross per 24 hour  Intake 1040 ml  Output 1450 ml  Net -410 ml   Filed Weights   05/31/20 1004  Weight: 136.1 kg    Examination:  General exam: No acute distress.  Appears stated age Respiratory system: Bibasilar crackles.  Normal work of breathing.  Room air Cardiovascular system: S1-S2, regular rate and rhythm, no murmurs, nonpitting edema left lower extremity  gastrointestinal system: Obese, nontender, nondistended, normal bowel sounds Central nervous system: Alert and oriented. No focal neurological deficits. Extremities: Symmetric 5 x 5 power. Skin: Left lower extremity with swelling and erythema.  Wound as noted in the pictures above. Psychiatry: Judgement and insight appear normal. Mood & affect appropriate.     Data Reviewed: I have personally reviewed following labs and imaging studies  CBC: Recent Labs  Lab 05/31/20 1014 06/01/20 0608  WBC 33.3* 31.4*  NEUTROABS 25.8*  --   HGB 14.6 13.0  HCT 43.2 38.4*  MCV 85.7 87.1  PLT 361 334   Basic Metabolic Panel: Recent Labs  Lab 05/31/20 1014 05/31/20 1326 06/01/20 0608  NA 132*  --  131*  K 3.4*  --  3.6  CL 98  --  98  CO2 22  --  23  GLUCOSE 129*  --  169*  BUN 25*  --  20  CREATININE 1.49*  --  1.21  CALCIUM 8.3*  --  7.8*  MG  --  2.5*  --    GFR: Estimated Creatinine Clearance: 98.3 mL/min (by C-G formula based on SCr of 1.21 mg/dL). Liver Function Tests: Recent Labs  Lab 05/31/20 1014  AST 26  ALT 36  ALKPHOS 116  BILITOT 1.5*  PROT 8.1  ALBUMIN 3.0*   No results for input(s): LIPASE, AMYLASE in the last 168 hours. No results for input(s): AMMONIA in the last 168 hours. Coagulation Profile: Recent Labs  Lab 05/31/20 1326  INR 1.3*   Cardiac  Enzymes: No results for input(s): CKTOTAL, CKMB, CKMBINDEX, TROPONINI in the last 168 hours. BNP (last 3 results) No results for input(s): PROBNP in the last 8760 hours. HbA1C: No results for input(s): HGBA1C in the last 72 hours. CBG: No results for input(s): GLUCAP in the last 168 hours. Lipid Profile: No results for input(s): CHOL, HDL, LDLCALC, TRIG, CHOLHDL, LDLDIRECT in the last 72 hours. Thyroid Function Tests: No results for input(s): TSH, T4TOTAL, FREET4, T3FREE, THYROIDAB in the last 72 hours. Anemia Panel: No results for input(s): VITAMINB12, FOLATE, FERRITIN, TIBC, IRON, RETICCTPCT in the last 72 hours. Sepsis Labs: Recent Labs  Lab 05/31/20 1014 05/31/20 1214 05/31/20 1326  PROCALCITON  --   --  4.22  LATICACIDVEN 1.7 1.5  --     Recent Results (from the past 240 hour(s))  Blood culture (routine x 2)     Status: None (Preliminary result)   Collection Time: 05/31/20 10:14 AM   Specimen: BLOOD  Result Value Ref Range Status  Specimen Description BLOOD LEFT ASSIST CONTROL  Final   Special Requests   Final    BOTTLES DRAWN AEROBIC AND ANAEROBIC Blood Culture results may not be optimal due to an excessive volume of blood received in culture bottles   Culture   Final    NO GROWTH < 24 HOURS Performed at [facility], [address], [place], [place] [address]    Report Status PENDING  Incomplete  Blood culture (routine x 2)     Status: None (Preliminary result)   Collection Time: 05/31/20  1:48 PM   Specimen: BLOOD  Result Value Ref Range Status   Specimen Description BLOOD BLOOD LEFT FOREARM  Final   Special Requests   Final    BOTTLES DRAWN AEROBIC AND ANAEROBIC Blood Culture adequate volume   Culture   Final    NO GROWTH < 24 HOURS Performed at [facility], [address], [place], [place] [address]    Report Status PENDING  Incomplete  Respiratory Panel by RT PCR (Flu A&B, Covid) - Nasopharyngeal Swab     Status: None   Collection  Time: 05/31/20  3:02 PM   Specimen: Nasopharyngeal Swab  Result Value Ref Range Status   SARS Coronavirus 2 by RT PCR NEGATIVE NEGATIVE Final    Comment: (NOTE) SARS-CoV-2 target nucleic acids are NOT DETECTED.  The SARS-CoV-2 RNA is generally detectable in upper respiratoy specimens during the acute phase of infection. The lowest concentration of SARS-CoV-2 viral copies this assay can detect is 131 copies/mL. A negative result does not preclude SARS-Cov-2 infection and should not be used as the sole basis for treatment or other patient management decisions. A negative result may occur with  improper specimen collection/handling, submission of specimen other than nasopharyngeal swab, presence of viral mutation(s) within the areas targeted by this assay, and inadequate number of viral copies (<131 copies/mL). A negative result must be combined with clinical observations, patient history, and epidemiological information. The expected result is Negative.  Fact Sheet for Patients:  https://www.moore.com/  Fact Sheet for Healthcare Providers:  https://www.young.biz/  This test is no t yet approved or cleared by the [place] FDA and  has been authorized for detection and/or diagnosis of SARS-CoV-2 by FDA under an Emergency Use Authorization (EUA). This EUA will remain  in effect (meaning this test can be used) for the duration of the COVID-19 declaration under Section 564(b)(1) of the Act, 21 U.S.C. section 360bbb-3(b)(1), unless the authorization is terminated or revoked sooner.     Influenza A by PCR NEGATIVE NEGATIVE Final   Influenza B by PCR NEGATIVE NEGATIVE Final    Comment: (NOTE) The Xpert Xpress SARS-CoV-2/FLU/RSV assay is intended as an aid in  the diagnosis of influenza from Nasopharyngeal swab specimens and  should not be used as a sole basis for treatment. Nasal washings and  aspirates are unacceptable for Xpert Xpress  SARS-CoV-2/FLU/RSV  testing.  Fact Sheet for Patients: https://www.moore.com/  Fact Sheet for Healthcare Providers: https://www.young.biz/  This test is not yet approved or cleared by the [place] FDA and  has been authorized for detection and/or diagnosis of SARS-CoV-2 by  FDA under an Emergency Use Authorization (EUA). This EUA will remain  in effect (meaning this test can be used) for the duration of the  Covid-19 declaration under Section 564(b)(1) of the Act, 21  U.S.C. section 360bbb-3(b)(1), unless the authorization is  terminated or revoked. Performed at [facility], [address], [place], [place] [address]  Radiology Studies: DG Cervical Spine 2 or 3 views  Result Date: 06/01/2020 CLINICAL DATA:  Knot in the posterior aspect of the neck. EXAM: CERVICAL SPINE - 2-3 VIEW COMPARISON:  Radiographs 08/07/2017 FINDINGS: Limited evaluation from C6 inferiorly secondary to overlapping soft tissues on the lateral radiograph. No evidence of acute fracture or traumatic malalignment. Similar straightening of the normal cervical lordosis. Similar moderate disc height loss and degenerative change at C5-C6 and C6-C7. Similar large anteriorly directed osteophytes. Similar long gated bilateral styloid processes. Prevertebral soft tissues are within normal limits. Limited evaluation of the soft tissues by radiograph. IMPRESSION: No evidence of acute osseous abnormality. Similar moderate degenerative changes, as detailed above. Electronically Signed   By: Feliberto Harts MD   On: 06/01/2020 08:49   DG Abd 1 View  Result Date: 06/01/2020 CLINICAL DATA:  Constipation EXAM: ABDOMEN - 1 VIEW COMPARISON:  None. FINDINGS: The bowel gas pattern is nonobstructive. Mild to moderate volume stool throughout the colon. No radio-opaque calculi or other significant radiographic abnormality are seen. IMPRESSION: Nonobstructive bowel gas  pattern. Mild to moderate volume stool throughout the colon. Electronically Signed   By: Duanne Guess D.O.   On: 06/01/2020 08:48   US Venous Img Lower Unilateral Left  Result Date: 05/31/2020 CLINICAL DATA:  Edema and redness EXAM: LEFT LOWER EXTREMITY VENOUS DUPLEX ULTRASOUND TECHNIQUE: Gray-scale sonography with graded compression, as well as color Doppler and duplex ultrasound were performed to evaluate the left lower extremity deep venous system from the level of the common femoral vein and including the common femoral, femoral, profunda femoral, popliteal and calf veins including the posterior tibial, peroneal and gastrocnemius veins when visible. The superficial great saphenous vein was also interrogated. Spectral Doppler was utilized to evaluate flow at rest and with distal augmentation maneuvers in the common femoral, femoral and popliteal veins. COMPARISON:  None. FINDINGS: Contralateral Common Femoral Vein: Respiratory phasicity is normal and symmetric with the symptomatic side. No evidence of thrombus. Normal compressibility. Common Femoral Vein: No evidence of thrombus. Normal compressibility, respiratory phasicity and response to augmentation. Saphenofemoral Junction: No evidence of thrombus. Normal compressibility and flow on color Doppler imaging. Profunda Femoral Vein: No evidence of thrombus. Normal compressibility and flow on color Doppler imaging. Femoral Vein: No evidence of thrombus. Normal compressibility, respiratory phasicity and response to augmentation. Popliteal Vein: No evidence of thrombus. Normal compressibility, respiratory phasicity and response to augmentation. Calf Veins: No evidence of thrombus. Normal compressibility and flow on color Doppler imaging. Superficial Great Saphenous Vein: No evidence of thrombus. Normal compressibility. Venous Reflux:  None. Other Findings:  There is diffuse soft tissue edema. IMPRESSION: No evidence of deep venous thrombosis in the left  lower extremity. Right common femoral vein also present. Diffuse left lower extremity soft tissue edema noted. Electronically Signed   By: Bretta Bang III M.D.   On: 05/31/2020 12:48        Scheduled Meds: . enoxaparin (LOVENOX) injection  0.5 mg/kg Subcutaneous Q24H  . ketorolac  15 mg Intravenous Q6H  . lactulose  20 g Oral Daily  . lidocaine  1 patch Transdermal Q24H  . senna-docusate  1 tablet Oral BID   Continuous Infusions: . cefTRIAXone (ROCEPHIN)  IV 2 g (06/01/20 1255)  . clindamycin (CLEOCIN) IV    . lactated ringers    . methocarbamol (ROBAXIN) IV    . vancomycin Stopped (06/01/20 0443)     LOS: 1 day    Time spent: 25 minutes    Tresa Moore, MD  [facility] Pager [phone]  If 7PM-7AM, please contact night-coverage 06/01/2020, 1:40 PM

## 2020-06-01 NOTE — Progress Notes (Signed)
Formatting of this note is different from the original.  PHARMACIST - PHYSICIAN COMMUNICATION    CONCERNING:  Enoxaparin (Lovenox) for DVT Prophylaxis     RECOMMENDATION:  Patient was prescribed enoxaparin 40 mg q24 hours for VTE prophylaxis.     Filed Weights    05/31/20 1004   Weight: 136.1 kg (300 lb)     Body mass index is 41.84 kg/m.    Estimated Creatinine Clearance: 79.8 mL/min (A) (by C-G formula based on SCr of 1.49 mg/dL (H)).    Based on [facility] policy patient is candidate for enoxaparin 0.5mg /kg TBW SQ every 24 hours based on BMI being >30.    DESCRIPTION:  Pharmacy has adjusted enoxaparin dose per [facility] policy.    Patient is now receiving enoxaparin 67.5 mg every 24 hours     Pricilla Riffle, PharmD  Clinical Pharmacist   06/01/2020  3:35 AM    Electronically signed by Pricilla Riffle, RPH at 06/01/2020  3:35 AM EDT

## 2020-06-01 NOTE — Progress Notes (Signed)
Formatting of this note might be different from the original.  OT Cancellation Note    Patient Details  Name: Thomas Browning  MRN: [id number]  DOB: May 17, 1966    Cancelled Treatment:    Reason Eval/Treat Not Completed: Other (comment). Consult received, chart reviewed. Per chart, pt pending MRI "to rule out deep tissue infection versus osteomyelitis" and "possible surgical consultation depending on results of MRI." Will hold OT evaluation at this time and re-attempt pending imaging and updated plan of care.     Richrd Prime, MPH, MS, OTR/L  ascom [phone]  06/01/20, 2:15 PM      Electronically signed by Eliezer Bottom, OT at 06/01/2020  2:15 PM EDT

## 2020-06-01 NOTE — Unmapped (Signed)
Formatting of this note is different from the original.  Occupational Therapy Evaluation  Patient Details  Name: Thomas Browning  MRN: [id number]  DOB: March 01, 1966  Today's Date: 06/01/2020    History of Present Illness 54 y.o. male with medical history significant of HTN, who presents with left leg pain, swelling, weeping. Patient states that his left leg pain started approximately 5 days ago, which has been progressively worsening.  Left leg is erythematous, swelling, with significant blistering, open wound and weeping from several popped blisters. The pain is constant, sharp, moderate to severe, nonradiating. 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 Ultrasound on admission negative.     Clinical Impression    Pt was seen for OT evaluation this date.  MRI LLE pending, RN cleared OT to work with pt. Pt tearful at start of session and expressed concern over what is happening to his leg. Active listening and emotional support provided and pt expressed appreciation. Pt politely declined offer to involve chaplain. 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 Pt presents with impairments in balance, LLE pain, open wounds/blistering to LLE, activity tolerance, and safety resulting in the need for SBA for ADL mobility (Pt's balance much improved holding onto IV pole versus 2WW) and STS LB ADL tasks. Pt ambulated to bathroom, remote supervision for toileting task. Pt sat EOB to perform LB dressing and appreciative of opportunity to get out of "wet clothes" 2/2 LLE seeping fluids. Bed changed and fresh chucks placed on pt's bed. LLE elevated with pillow. Pt would benefit from skilled OT services while here to address noted impairments and functional limitations (see below for any additional details) in order to maximize safety and independence while minimizing falls risk and caregiver burden. Upon hospital discharge, do not  anticipate need for skilled OT services at this time. Will continue to assess as pt progresses with hospitalization.     Follow Up Recommendations   No OT follow up     Equipment Recommendations   None recommended by OT     Recommendations for Other Services        Precautions / Restrictions Precautions  Precautions: Fall  Precaution Comments: LLE wound  Restrictions  Weight Bearing Restrictions: No         Mobility Bed Mobility  Overal bed mobility: Needs Assistance  Bed Mobility: Sit to Supine;Supine to Sit      Supine to sit: Min guard;HOB elevated  Sit to supine: Supervision          Transfers  Overall transfer level: Needs assistance  Equipment used: Rolling walker (2 wheeled);None  Transfers: Sit to/from Stand  Sit to Stand: Supervision;Min guard          General transfer comment: increased effort to perform      Balance Overall balance assessment: Needs assistance  Sitting-balance support: No upper extremity supported;Feet supported  Sitting balance-Leahy Scale: Good      Standing balance support: Bilateral upper extremity supported;Single extremity supported  Standing balance-Leahy Scale: Fair  Standing balance comment: balance worse when attempting to use RW, improved when using IV pole                            ADL either performed or assessed with clinical judgement     ADL Overall ADL's : Needs assistance/impaired  Eating/Feeding: Independent  Grooming: Wash/dry hands;Supervision/safety;Standing  Grooming Details (indicate cue type and reason): remote supervision              Lower Body Dressing: Sit to/from stand;Supervision/safety;Set up    Toilet Transfer: Regular Toilet;Supervision/safety  Toilet Transfer Details (indicate cue type and reason): SBA  Toileting- Clothing Manipulation and Hygiene: Independent        Functional mobility during ADLs: Supervision/safety  General ADL Comments: pt initially trialing RW and with limited WBing through L heel 2/2 pain, ambulated with improved balance and  WBing with UE support on IV pole     Vision Patient Visual Report: No change from baseline      Perception      Praxis        Pertinent Vitals/Pain Pain Assessment: 0-10  Pain Score: 6   Pain Location: LLE  Pain Descriptors / Indicators: Aching;Throbbing;Sharp  Pain Intervention(s): Limited activity within patient's tolerance;Monitored during session;Patient requesting pain meds-RN notified;Repositioned;RN gave pain meds during session     Hand Dominance Right    Extremity/Trunk Assessment Upper Extremity Assessment  Upper Extremity Assessment: Overall WFL for tasks assessed    Lower Extremity Assessment  Lower Extremity Assessment: Defer to PT evaluation;LLE deficits/detail  LLE Deficits / Details: at least 4/5, pain limited, open blisters/wounds, leg is red, swollen, hot to touch but pt reports improving  LLE: Unable to fully assess due to pain        Communication Communication  Communication: No difficulties    Cognition Arousal/Alertness: Awake/alert  Behavior During Therapy: WFL for tasks assessed/performed  Overall Cognitive Status: Within Functional Limits for tasks assessed                                      General Comments  LLE red, blistered, edematous, seeping, red and hot to touch; elevated with pillow, chucks replaced underneath to collect fluid      Exercises Other Exercises  Other Exercises: Pt instructed in falls prevention, LE positioning for improved comfort and skin integrity  Other Exercises: Pt amb to bathroom for toileting with supervision/SBA, improved balance using IV pole for UE support versus RW  Other Exercises: Pt tearful at start of session, encouragement, active listening, and emotional support provided; pt expressed appreciation    Shoulder Instructions       Home Living Family/patient expects to be discharged to:: Private residence  Living Arrangements: Parent (pt's mother lives with him)  Available Help at Discharge: Family  Type of Home: House              Bathroom Shower/Tub:  Counsellor Toilet: Standard      Home Equipment: None            Prior Functioning/Environment Level of Independence: Independent         Comments: Pt independent, works as a Engineer, 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, 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); denies falls         OT Problem List: Pain;Increased edema;Decreased knowledge of use of DME or AE;Obesity;Impaired balance (sitting and/or standing);Decreased activity tolerance      OT Treatment/Interventions: Self-care/ADL training;Therapeutic exercise;Therapeutic activities;DME and/or AE instruction;Balance training;Patient/family education     OT Goals(Current goals can be found in the care plan section) Acute Rehab OT Goals  Patient Stated Goal: to understand what's happening to his leg and return to PLOF  OT Goal Formulation:  With patient  Time For Goal Achievement: 06/15/20  Potential to Achieve Goals: Good  ADL Goals  Pt Will Transfer to Toilet: with modified independence;ambulating;regular height toilet (no LOB, LRAD for ambulation)  Additional ADL Goal #1: Pt will perform UB/LB dressing with modified independence, AE as needed with no LOB during ADL transfers.  Additional ADL Goal #2: Pt will perform UB/LB bathing with modified independence, AE as needed with no LOB during ADL transfers.   OT Frequency: Min 1X/week    Barriers to D/C:            Co-evaluation                AM-PAC OT "6 Clicks" Daily Activity      Outcome Measure Help from another person eating meals?: None  Help from another person taking care of personal grooming?: None  Help from another person toileting, which includes using toliet, bedpan, or urinal?: A Little  Help from another person bathing (including washing, rinsing, drying)?: A Little  Help from another person to put on and taking off regular upper body clothing?: None  Help from another person to put on and taking off regular lower body clothing?: A Little  6 Click Score: 21    End of Session Nurse  Communication: Mobility status    Activity Tolerance: Patient tolerated treatment well  Patient left: in bed;with call bell/phone within reach    OT Visit Diagnosis: Other abnormalities of gait and mobility (R26.89);Pain  Pain - Right/Left: Left  Pain - part of body: Leg     Time: 1530-1602  OT Time Calculation (min): 32 min  Charges:  OT General Charges  $OT Visit: 1 Visit  OT Evaluation  $OT Eval Moderate Complexity: 1 Mod  OT Treatments  $Self Care/Home Management : 8-22 mins    Richrd Prime, MPH, MS, OTR/L  ascom [phone]  06/01/20, 4:50 PM      Electronically signed by Eliezer Bottom, OT at 06/01/2020  4:51 PM EDT

## 2020-06-01 NOTE — Progress Notes (Signed)
Formatting of this note might be different from the original.  Received report from Molli Hazard, Charity fundraiser. Assuming care of patient at this time.   Electronically signed by Inez Pilgrim, RN at 06/01/2020  8:40 PM EDT

## 2020-06-01 NOTE — Progress Notes (Signed)
Formatting of this note might be different from the original.  Pt up and down to bathroom several time and stated " I feel constipated". NP in the unite spoke to her received some new orders for MOM and took MOM pt and he refused it, Gave pt a cup of Prune Juice and he refused also.   I asked what next we can do to help he " MY job is to com palin to you" . Pt was educated and encourage to take medication as ordered. Pt was also encouraged to increase fluid intake and ambulating could help his bowel moved. He said " I will talk it over with my Dr in AM".    Electronically signed by Rita Ohara, RN at 06/01/2020  4:11 AM EDT

## 2020-06-01 NOTE — Progress Notes (Signed)
Formatting of this note is different from the original.  Images from the original note were not included.    PROGRESS NOTE    Thomas Browning  JXB:[id number] DOB: [birth year] DOA: 05/31/2020  PCP: Patient, No Pcp Per   Brief Narrative:   54 y.o. male with medical history significant of HTN, who presents with left leg pain, swelling, weeping.    Patient states that his left leg pain started approximately 5 days ago, which has been progressively worsening.  Left leg is erythematous, swelling, with significant blistering, open wound and weeping from several popped blisters. The pain is constant, sharp, moderate to severe, nonradiating. 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  Ultrasound on admission negative.    Patient presented with left leg complaints also complains of constipation neck pain.  Started on broad-spectrum IV antibiotics on admission.    Patient's main complaints are constipation and neck pain.  KUB negative for obstruction.  X-ray C-spine negative for fracture.    Assessment & Plan:    Principal Problem:    Left leg cellulitis  Active Problems:    Hypertension    Sepsis (HCC)    Hypokalemia    AKI (acute kidney injury) (HCC)    Cellulitis of left leg    Sepsis due to left leg cellulitis:   Patient meets criteria for sepsis with leukocytosis with WBC 33.3 and tachycardia with heart rate 100 -->95.    Lactate is normal, currently hemodynamically stable.   Left lower extremity venous Doppler is negative for DVT  Marked leukocytosis  Elevated inflammatory markers  Noted AKI  Mild hyponatremia  Wound consult performed remotely  LRINEC score 3, below threshold of six for necrotizing fasciitis  Plan:  Continue vancomycin, pharmacy to dose  Continue Rocephin 2 g every 24 hours  Add clindamycin 900 mg IV every 8 hours  Intravenous fluids  Follow blood cultures  Check MRI left lower extremity rule out osteomyelitis versus necrotizing fasciitis    Constipation  Patient states he is chronically  constipated and takes multiple laxatives with minimal relief  Likely owing to dietary indiscretions 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  -IV hydralazine as needed    Hypokalemia:  -Check potassium daily, replete as necessary  -Following replace magnesium    AKI (acute kidney injury) (HCC)  Creatinine 1.49, BUN 25, no baseline creatinine available,   may be due to dehydration versus sepsis  Plan:  Intravenous fluids  Avoid nephrotoxins  Daily BMP    Cervicalgia  Patient is a truck driver  Exam significant for rigid lump in posterior neck  Noted to soft tissue on x-ray  C-spine x-ray negative for fracture  Plan:  Multimodal pain control  Lidoderm patch  Physical therapy    Morbid obesity  BMI 41.8  This complicates care and prognosis    DVT prophylaxis: Lovenox  Code Status: Full  Family Communication: None today  Disposition Plan: Status is: Inpatient    Remains inpatient appropriate because:Inpatient level of care appropriate due to severity of illness    Dispo: The patient is from: Home               Anticipated d/c is to: Home  Anticipated d/c date is: 3 days               Patient currently is not medically stable to d/c.    Sepsis secondary to cellulitis.  MRI to rule out deep tissue infection versus osteomyelitis.  Possible surgical consultation depending on results of MRI.  Treating constipation with multimodal bowel regimen.  Treating cervicalgia with multimodal pain regimen.    Consultants:    None    Procedures:    None    Antimicrobials:    Vancomycin   Ceftriaxone   Clindamycin    Subjective:  Seen and examined.  Endorses constipation, neck pain, pain in left lower extremity    Objective:  Vitals:    05/31/20 1909 05/31/20 2338 06/01/20 0730 06/01/20 1157   BP: (!) 153/94 (!) 161/90  (!) 145/88 (!) 144/78   Pulse: 92 99 97 97   Resp: 17 16 18 18    Temp: 98.4 F (36.9 C) 98.7 F (37.1 C) 98.5 F (36.9 C) 98.4 F (36.9 C)   TempSrc: Oral Oral Oral    SpO2: 100% 95% 100% 98%   Weight:       Height:         Intake/Output Summary (Last 24 hours) at 06/01/2020 1340  Last data filed at 06/01/2020 1016  Gross per 24 hour   Intake 1040 ml   Output 1450 ml   Net -410 ml     Filed Weights    05/31/20 1004   Weight: 136.1 kg     Examination:    General exam: No acute distress.  Appears stated age  Respiratory system: Bibasilar crackles.  Normal work of breathing.  Room air  Cardiovascular system: S1-S2, regular rate and rhythm, no murmurs, nonpitting edema left lower extremity   gastrointestinal system: Obese, nontender, nondistended, normal bowel sounds  Central nervous system: Alert and oriented. No focal neurological deficits.  Extremities: Symmetric 5 x 5 power.  Skin: Left lower extremity with swelling and erythema.  Wound as noted in the pictures above.  Psychiatry: Judgement and insight appear normal. Mood & affect appropriate.     Data Reviewed: I have personally reviewed following labs and imaging studies    CBC:  Recent Labs   Lab 05/31/20  1014 06/01/20  0608   WBC 33.3* 31.4*   NEUTROABS 25.8*  --    HGB 14.6 13.0   HCT 43.2 38.4*   MCV 85.7 87.1   PLT 361 334     Basic Metabolic Panel:  Recent Labs   Lab 05/31/20  1014 05/31/20  1326 06/01/20  0608   NA 132*  --  131*   K 3.4*  --  3.6   CL 98  --  98   CO2 22  --  23   GLUCOSE 129*  --  169*   BUN 25*  --  20   CREATININE 1.49*  --  1.21   CALCIUM 8.3*  --  7.8*   MG  --  2.5*  --      GFR:  Estimated Creatinine Clearance: 98.3 mL/min (by C-G formula based on SCr of 1.21 mg/dL).  Liver Function Tests:  Recent Labs   Lab 05/31/20  1014   AST 26   ALT 36   ALKPHOS 116   BILITOT 1.5*   PROT 8.1   ALBUMIN 3.0*     No results for input(s): LIPASE, AMYLASE in the last 168 hours.  No results for input(s): AMMONIA in  the last 168  hours.  Coagulation Profile:  Recent Labs   Lab 05/31/20  1326   INR 1.3*     Cardiac Enzymes:  No results for input(s): CKTOTAL, CKMB, CKMBINDEX, TROPONINI in the last 168 hours.  BNP (last 3 results)  No results for input(s): PROBNP in the last 8760 hours.  HbA1C:  No results for input(s): HGBA1C in the last 72 hours.  CBG:  No results for input(s): GLUCAP in the last 168 hours.  Lipid Profile:  No results for input(s): CHOL, HDL, LDLCALC, TRIG, CHOLHDL, LDLDIRECT in the last 72 hours.  Thyroid Function Tests:  No results for input(s): TSH, T4TOTAL, FREET4, T3FREE, THYROIDAB in the last 72 hours.  Anemia Panel:  No results for input(s): VITAMINB12, FOLATE, FERRITIN, TIBC, IRON, RETICCTPCT in the last 72 hours.  Sepsis Labs:  Recent Labs   Lab 05/31/20  1014 05/31/20  1214 05/31/20  1326   PROCALCITON  --   --  4.22   LATICACIDVEN 1.7 1.5  --      Recent Results (from the past 240 hour(s))   Blood culture (routine x 2)     Status: None (Preliminary result)    Collection Time: 05/31/20 10:14 AM    Specimen: BLOOD   Result Value Ref Range Status    Specimen Description BLOOD LEFT ASSIST CONTROL  Final    Special Requests   Final     BOTTLES DRAWN AEROBIC AND ANAEROBIC Blood Culture results may not be optimal due to an excessive volume of blood received in culture bottles    Culture   Final     NO GROWTH < 24 HOURS  Performed at [facility], [address], [place], [place] [address]     Report Status PENDING  Incomplete   Blood culture (routine x 2)     Status: None (Preliminary result)    Collection Time: 05/31/20  1:48 PM    Specimen: BLOOD   Result Value Ref Range Status    Specimen Description BLOOD BLOOD LEFT FOREARM  Final    Special Requests   Final     BOTTLES DRAWN AEROBIC AND ANAEROBIC Blood Culture adequate volume    Culture   Final     NO GROWTH < 24 HOURS  Performed at [facility], [address], [place], [place] [address]     Report Status PENDING  Incomplete   Respiratory Panel  by RT PCR (Flu A&B, Covid) - Nasopharyngeal Swab     Status: None    Collection Time: 05/31/20  3:02 PM    Specimen: Nasopharyngeal Swab   Result Value Ref Range Status    SARS Coronavirus 2 by RT PCR NEGATIVE NEGATIVE Final     Comment: (NOTE)  SARS-CoV-2 target nucleic acids are NOT DETECTED.    The SARS-CoV-2 RNA is generally detectable in upper respiratoy  specimens during the acute phase of infection. The lowest  concentration of SARS-CoV-2 viral copies this assay can detect is  131 copies/mL. A negative result does not preclude SARS-Cov-2  infection and should not be used as the sole basis for treatment or  other patient management decisions. A negative result may occur with   improper specimen collection/handling, submission of specimen other  than nasopharyngeal swab, presence of viral mutation(s) within the  areas targeted by this assay, and inadequate number of viral copies  (<131 copies/mL). A negative result must be combined with clinical  observations, patient history, and epidemiological information. The  expected  result is Negative.    Fact Sheet for Patients:   https://www.moore.com/    Fact Sheet for Healthcare Providers:   https://www.young.biz/    This test is no t yet approved or cleared by the [place] FDA and   has been authorized for detection and/or diagnosis of SARS-CoV-2 by  FDA under an Emergency Use Authorization (EUA). This EUA will remain   in effect (meaning this test can be used) for the duration of the  COVID-19 declaration under Section 564(b)(1) of the Act, 21 U.S.C.  section 360bbb-3(b)(1), unless the authorization is terminated or  revoked sooner.       Influenza A by PCR NEGATIVE NEGATIVE Final    Influenza B by PCR NEGATIVE NEGATIVE Final     Comment: (NOTE)  The Xpert Xpress SARS-CoV-2/FLU/RSV assay is intended as an aid in   the diagnosis of influenza from Nasopharyngeal swab specimens and   should not be used as a sole basis for  treatment. Nasal washings and   aspirates are unacceptable for Xpert Xpress SARS-CoV-2/FLU/RSV   testing.    Fact Sheet for Patients:  https://www.moore.com/    Fact Sheet for Healthcare Providers:  https://www.young.biz/    This test is not yet approved or cleared by the [place] FDA and   has been authorized for detection and/or diagnosis of SARS-CoV-2 by   FDA under an Emergency Use Authorization (EUA). This EUA will remain   in effect (meaning this test can be used) for the duration of the   Covid-19 declaration under Section 564(b)(1) of the Act, 21   U.S.C. section 360bbb-3(b)(1), unless the authorization is   terminated or revoked.  Performed at [facility], [address] [place],   [address]        Radiology Studies:  DG Cervical Spine 2 or 3 views    Result Date: 06/01/2020  CLINICAL DATA:  Knot in the posterior aspect of the neck. EXAM: CERVICAL SPINE - 2-3 VIEW COMPARISON:  Radiographs 08/07/2017 FINDINGS: Limited evaluation from C6 inferiorly secondary to overlapping soft tissues on the lateral radiograph. No evidence of acute fracture or traumatic malalignment. Similar straightening of the normal cervical lordosis. Similar moderate disc height loss and degenerative change at C5-C6 and C6-C7. Similar large anteriorly directed osteophytes. Similar long gated bilateral styloid processes. Prevertebral soft tissues are within normal limits. Limited evaluation of the soft tissues by radiograph. IMPRESSION: No evidence of acute osseous abnormality. Similar moderate degenerative changes, as detailed above. Electronically Signed   By: Feliberto Harts MD   On: 06/01/2020 08:49     DG Abd 1 View    Result Date: 06/01/2020  CLINICAL DATA:  Constipation EXAM: ABDOMEN - 1 VIEW COMPARISON:  None. FINDINGS: The bowel gas pattern is nonobstructive. Mild to moderate volume stool throughout the colon. No radio-opaque calculi or other significant radiographic  abnormality are seen. IMPRESSION: Nonobstructive bowel gas pattern. Mild to moderate volume stool throughout the colon. Electronically Signed   By: Duanne Guess D.O.   On: 06/01/2020 08:48     US Venous Img Lower Unilateral Left    Result Date: 05/31/2020  CLINICAL DATA:  Edema and redness EXAM: LEFT LOWER EXTREMITY VENOUS DUPLEX ULTRASOUND TECHNIQUE: Gray-scale sonography with graded compression, as well as color Doppler and duplex ultrasound were performed to evaluate the left lower extremity deep venous system from the level of the common femoral vein and including the common femoral, femoral, profunda femoral, popliteal and calf veins including the posterior tibial, peroneal  and gastrocnemius veins when visible. The superficial great saphenous vein was also interrogated. Spectral Doppler was utilized to evaluate flow at rest and with distal augmentation maneuvers in the common femoral, femoral and popliteal veins. COMPARISON:  None. FINDINGS: Contralateral Common Femoral Vein: Respiratory phasicity is normal and symmetric with the symptomatic side. No evidence of thrombus. Normal compressibility. Common Femoral Vein: No evidence of thrombus. Normal compressibility, respiratory phasicity and response to augmentation. Saphenofemoral Junction: No evidence of thrombus. Normal compressibility and flow on color Doppler imaging. Profunda Femoral Vein: No evidence of thrombus. Normal compressibility and flow on color Doppler imaging. Femoral Vein: No evidence of thrombus. Normal compressibility, respiratory phasicity and response to augmentation. Popliteal Vein: No evidence of thrombus. Normal compressibility, respiratory phasicity and response to augmentation. Calf Veins: No evidence of thrombus. Normal compressibility and flow on color Doppler imaging. Superficial Great Saphenous Vein: No evidence of thrombus. Normal compressibility. Venous Reflux:  None. Other Findings:  There is diffuse soft tissue edema.  IMPRESSION: No evidence of deep venous thrombosis in the left lower extremity. Right common femoral vein also present. Diffuse left lower extremity soft tissue edema noted. Electronically Signed   By: Bretta Bang III M.D.   On: 05/31/2020 12:48     Scheduled Meds:  ? enoxaparin (LOVENOX) injection  0.5 mg/kg Subcutaneous Q24H   ? ketorolac  15 mg Intravenous Q6H   ? lactulose  20 g Oral Daily   ? lidocaine  1 patch Transdermal Q24H   ? senna-docusate  1 tablet Oral BID     Continuous Infusions:  ? cefTRIAXone (ROCEPHIN)  IV 2 g (06/01/20 1255)   ? clindamycin (CLEOCIN) IV     ? lactated ringers     ? methocarbamol (ROBAXIN) IV     ? vancomycin Stopped (06/01/20 0443)      LOS: 1 day     Time spent: 25 minutes    Tresa Moore, MD  [facility]  Pager [phone]    If 7PM-7AM, please contact night-coverage  06/01/2020, 1:40 PM     Electronically signed by Tresa Moore, MD at 06/01/2020  1:48 PM EDT

## 2020-06-02 LAB — CBC
HCT: 39.3 % (ref 39.0–52.0)
Hemoglobin: 13.2 g/dL (ref 13.0–17.0)
MCH: 29.2 pg (ref 26.0–34.0)
MCHC: 33.6 g/dL (ref 30.0–36.0)
MCV: 86.9 fL (ref 80.0–100.0)
Platelets: 347 10*3/uL (ref 150–400)
RBC: 4.52 MIL/uL (ref 4.22–5.81)
RDW: 15.4 % (ref 11.5–15.5)
WBC: 31 10*3/uL — ABNORMAL HIGH (ref 4.0–10.5)
nRBC: 0 % (ref 0.0–0.2)

## 2020-06-02 LAB — BASIC METABOLIC PANEL
Anion gap: 10 (ref 5–15)
BUN: 15 mg/dL (ref 6–20)
CO2: 25 mmol/L (ref 22–32)
Calcium: 8 mg/dL — ABNORMAL LOW (ref 8.9–10.3)
Chloride: 99 mmol/L (ref 98–111)
Creatinine, Ser: 1.03 mg/dL (ref 0.61–1.24)
GFR, Estimated: >60 mL/min (ref >60)
Glucose, Bld: 145 mg/dL — ABNORMAL HIGH (ref 70–99)
Potassium: 4.2 mmol/L (ref 3.5–5.1)
Sodium: 134 mmol/L — ABNORMAL LOW (ref 135–145)

## 2020-06-02 LAB — MAGNESIUM: Magnesium: 2.3 mg/dL (ref 1.7–2.4)

## 2020-06-02 LAB — CREATININE, SERUM
Creatinine, Ser: 1.22 mg/dL (ref 0.61–1.24)
GFR, Estimated: >60 mL/min (ref >60)

## 2020-06-02 MED ORDER — KETOROLAC TROMETHAMINE 30 MG/ML IJ SOLN
INTRAMUSCULAR | Status: AC
  Filled 2020-06-02: qty 1

## 2020-06-02 MED ORDER — POLYETHYLENE GLYCOL 3350 17 G PO PACK
34.0000 g | PACK | ORAL | Status: AC
  Administered 2020-06-02 (×2): 34 g via ORAL
  Filled 2020-06-02 (×3): qty 2

## 2020-06-02 MED ORDER — TRAMADOL HCL 50 MG PO TABS
50.0000 mg | ORAL_TABLET | Freq: Four times a day (QID) | ORAL | Status: DC | PRN
  Administered 2020-06-02 – 2020-06-05 (×8): 50 mg via ORAL
  Filled 2020-06-02 (×8): qty 1

## 2020-06-02 MED ORDER — HYDROXYZINE HCL 50 MG PO TABS
50.0000 mg | ORAL_TABLET | Freq: Every day | ORAL | Status: DC
  Administered 2020-06-02 – 2020-06-03 (×2): 50 mg via ORAL
  Filled 2020-06-02 (×3): qty 1

## 2020-06-02 NOTE — Evaluation (Signed)
Physical Therapy Evaluation Patient Details Name: Stephen [name] MRN: [id number] DOB: [birth year] Today's Date: 06/02/2020   History of Present Illness  Pt is a 54 y.o. male presenting to hospital 10/25 with L leg redness, swelling, pain, and weapage.  Pt admitted with sepsis d/t L leg cellulitis, htn, hypokalemia, AKI, and cervicalgia (rigid lump posterior neck).  PMH includes htn.  Clinical Impression  Prior to hospital admission, pt was independent with ambulation.  Currently pt is SBA with bed mobility; SBA with transfers; and CGA ambulating with RW 30 feet (limited distance ambulating d/t L LE pain increasing to 10/10; L LE pain 5/10 at rest beginning/end of session).  Vc's required for walker use and to increase UE support through walker to offweight L LE during ambulation to improve pain control.  Pt would benefit from skilled PT to address noted impairments and functional limitations (see below for any additional details).  Upon hospital discharge, pt would benefit from HHPT.    Follow Up Recommendations Home health PT    Equipment Recommendations  Rolling walker with 5" wheels;3in1 (PT)    Recommendations for Other Services OT consult     Precautions / Restrictions Precautions Precautions: Fall Precaution Comments: LLE wound Restrictions Weight Bearing Restrictions: No      Mobility  Bed Mobility Overal bed mobility: Needs Assistance Bed Mobility: Supine to Sit;Sit to Supine     Supine to sit: Supervision;HOB elevated Sit to supine: Supervision;HOB elevated   General bed mobility comments: mild increased effort to move L LE d/t pain and swelling    Transfers Overall transfer level: Needs assistance Equipment used: Rolling walker (2 wheeled);None Transfers: Sit to/from Stand Sit to Stand: Supervision         General transfer comment: increased effort to stand with UE support; pt standing mostly with weight through R LE (d/t L LE  pain)  Ambulation/Gait Ambulation/Gait assistance: Min guard Gait Distance (Feet): 30 Feet Assistive device: Rolling walker (2 wheeled)   Gait velocity: decreased   General Gait Details: antalgic; decreased stance time L LE; vc's to stay closer to RW and to increase UE support through RW to offweight L LE (d/t L LE pain)  Stairs            Wheelchair Mobility    Modified Rankin (Stroke Patients Only)       Balance Overall balance assessment: Needs assistance Sitting-balance support: No upper extremity supported;Feet supported Sitting balance-Leahy Scale: Good Sitting balance - Comments: steady sitting reaching within BOS     Standing balance-Leahy Scale: Fair Standing balance comment: steady standing without UE support although pt with most of weight through R LE                             Pertinent Vitals/Pain Pain Assessment: 0-10 Pain Score: 5  (10/10 with walking; 5/10 at rest beginning/end of session) Pain Location: LLE Pain Descriptors / Indicators: Aching;Throbbing;Sharp Pain Intervention(s): Limited activity within patient's tolerance;Monitored during session;Repositioned;Other (comment) (pt reported he would call for pain medication when he wanted it)  Vitals (HR and O2 on room air) stable and WFL throughout treatment session.    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     Hand Dominance   Dominant Hand: Right    Extremity/Trunk Assessment   Upper Extremity Assessment Upper Extremity Assessment: Overall WFL for  tasks assessed    Lower Extremity Assessment Lower Extremity Assessment:  (R LE WFL) LLE Deficits / Details: hip flexion at least 3/5 (limited d/t L LE pain); knee flexion/extension AROM limited d/t L LE pain; DF to at least neutral (limited ROM d/t swelling and L LE pain) LLE: Unable to fully assess due to pain    Cervical / Trunk Assessment Cervical / Trunk Assessment: Normal  Communication   Communication: No difficulties  Cognition Arousal/Alertness: Awake/alert Behavior During Therapy: WFL for tasks assessed/performed Overall Cognitive Status: Within Functional Limits for tasks assessed                                        General Comments General comments (skin integrity, edema, etc.): L LE red, warm, edematous; chucks underneath to collect fluid.  Nursing cleared pt for participation in physical therapy.  Pt agreeable to PT session.  Pt reports he has been walking to bathroom on his own with IV pole.    Exercises     Assessment/Plan    PT Assessment Patient needs continued PT services  PT Problem List Decreased strength;Decreased activity tolerance;Decreased balance;Decreased mobility;Decreased knowledge of use of DME;Pain;Decreased skin integrity       PT Treatment Interventions DME instruction;Gait training;Stair training;Functional mobility training;Therapeutic activities;Therapeutic exercise;Balance training;Patient/family education    PT Goals (Current goals can be found in the Care Plan section)  Acute Rehab PT Goals Patient Stated Goal: to improve L LE pain and mobility PT Goal Formulation: With patient Time For Goal Achievement: 06/16/20 Potential to Achieve Goals: Good    Frequency Min 2X/week   Barriers to discharge        Co-evaluation               AM-PAC PT "6 Clicks" Mobility  Outcome Measure Help needed turning from your back to your side while in a flat bed without using bedrails?: None Help needed moving from lying on  your back to sitting on the side of a flat bed without using bedrails?: A Little Help needed moving to and from a bed to a chair (including a wheelchair)?: A Little Help needed standing up from a chair using your arms (e.g., wheelchair or bedside chair)?: A Little Help needed to walk in hospital room?: A Little Help needed climbing 3-5 steps with a railing? : A Little 6 Click Score: 19    End of Session Equipment Utilized During Treatment: Gait belt Activity Tolerance: Patient limited by pain Patient left: in bed;with call bell/phone within reach Nurse Communication: Mobility status;Precautions;Other (comment) (via white board) PT Visit Diagnosis: Other abnormalities of gait and mobility (R26.89);Difficulty in walking, not elsewhere classified (R26.2);Pain Pain - Right/Left: Left Pain - part of body: Leg    Time: 0927-0955 PT Time Calculation (min) (ACUTE ONLY): 28 min   Charges:   PT Evaluation $PT Eval Low Complexity: 1 Low PT Treatments $Gait Training: 8-22 mins       Hendricks Limes, PT 06/02/20, 11:36 AM

## 2020-06-02 NOTE — Plan of Care (Signed)
  Problem: Education: Goal: Knowledge of General Education information will improve Description: Including pain rating scale, medication(s)/side effects and non-pharmacologic comfort measures Outcome: Progressing   Problem: Health Behavior/Discharge Planning: Goal: Ability to manage health-related needs will improve Outcome: Progressing   Problem: Clinical Measurements: Goal: Will remain free from infection Outcome: Progressing   Problem: Activity: Goal: Risk for activity intolerance will decrease Outcome: Progressing   Problem: Coping: Goal: Level of anxiety will decrease Outcome: Progressing   Problem: Pain Managment: Goal: General experience of comfort will improve Outcome: Progressing   Problem: Safety: Goal: Ability to remain free from injury will improve Outcome: Progressing   Problem: Skin Integrity: Goal: Risk for impaired skin integrity will decrease Outcome: Progressing   

## 2020-06-02 NOTE — TOC Initial Note (Signed)
Transition of Care (TOC) - Initial/Assessment Note    Patient Details  Name: Stephen [name] MRN: [id number] Date of Birth: 03/26/1966  Transition of Care (TOC) CM/SW Contact:    Misty D Green, RN Phone Number: 06/02/2020, 3:17 PM  Clinical Narrative:     RNCM met with patient in room, patient lying in bed with mother at bedside. Patient reports to feeling somewhat better today. Patient is normally independent at home Discussed discharge planning and that at this time therapy dept is recommending PT at home as well as a rolling walker and 3N1. Patient is agreeable to home health as well as walker but does not feel he needs 3n1 at this time. Discussed PCP and patient reports he doesn't have one but he is agreeable to [facility] application and this was provided to patient.   RNCM reached out to Teresa with [facility] and they can accept patient for PT only but they do not have availability with nursing at this time. She reported they could take him for PT.  RNCM reached out to Zack with [employer] and they will provide patient with rolling walker            Expected Discharge Plan: Home w Home Health Services Barriers to Discharge: Continued Medical Work up   Patient Goals and CMS Choice        Expected Discharge Plan and Services Expected Discharge Plan: Home w Home Health Services       Living arrangements for the past 2 months: Single Family Home                 DME Arranged: Walker rolling DME Agency: [facility] Date DME Agency Contacted: 06/02/20 Time DME Agency Contacted: [id number] Representative spoke with at DME Agency: Zack HH Arranged: PT HH Agency: [facility] (formerly [facility]) Date HH Agency Contacted: 06/02/20 Time HH Agency Contacted: [id number] Representative spoke with at HH Agency: Teresa  Prior Living Arrangements/Services Living arrangements for the past 2 months: Single Family Home Lives with:: Self Patient language and need for interpreter  reviewed:: Yes        Need for Family Participation in Patient Care: Yes (Comment) Care giver support system in place?: Yes (comment)   Criminal Activity/Legal Involvement Pertinent to Current Situation/Hospitalization: No - Comment as needed  Activities of Daily Living Home Assistive Devices/Equipment: None ADL Screening (condition at time of admission) Patient's cognitive ability adequate to safely complete daily activities?: Yes Is the patient deaf or have difficulty hearing?: No Does the patient have difficulty seeing, even when wearing glasses/contacts?: No Does the patient have difficulty concentrating, remembering, or making decisions?: No Patient able to express need for assistance with ADLs?: Yes Does the patient have difficulty dressing or bathing?: No Independently performs ADLs?: Yes (appropriate for developmental age) Does the patient have difficulty walking or climbing stairs?: Yes Weakness of Legs: Left Weakness of Arms/Hands: None  Permission Sought/Granted                  Emotional Assessment Appearance:: Appears stated age   Affect (typically observed): Appropriate Orientation: : Oriented to Self, Oriented to Place, Oriented to  Time, Oriented to Situation Alcohol / Substance Use: Not Applicable Psych Involvement: No (comment)  Admission diagnosis:  Cellulitis of left leg [L03.116] Cellulitis of left lower extremity [L03.116] Patient Active Problem List   Diagnosis Date Noted  . Left leg cellulitis 05/31/2020  . Cellulitis of left leg 05/31/2020  . Hypertension   . Sepsis (  HCC)   . Hypokalemia   . AKI (acute kidney injury) ([place])    PCP:  Patient, No Pcp Per Pharmacy:  No Pharmacies Listed    Social Determinants of Health (SDOH) Interventions    Readmission Risk Interventions No flowsheet data found.

## 2020-06-02 NOTE — Progress Notes (Signed)
PROGRESS NOTE    Stephen [name]  TKP:[id number] DOB: [birth year] DOA: 05/31/2020 PCP: Patient, No Pcp Per  Brief Narrative:  54 y.o. male with medical history significant of HTN, who presents with left leg pain, swelling, weeping.  Patient states that his left leg pain started approximately 5 days ago, which has been progressively worsening.  Left leg is erythematous, swelling, with significant blistering, open wound and weeping from several popped blisters. The pain is constant, sharp, moderate to severe, nonradiating. 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  Ultrasound on admission negative.  Patient presented with left leg complaints also complains of constipation neck pain.  Started on broad-spectrum IV antibiotics on admission.  Patient's main complaints are constipation and neck pain.  KUB negative for obstruction.  X-ray C-spine negative for fracture.    Assessment & Plan:   Principal Problem:   Left leg cellulitis Active Problems:   Hypertension   Sepsis (HCC)   Hypokalemia   AKI (acute kidney injury) (HCC)   Cellulitis of left leg  Severe Sepsis due to  left leg cellulitis:  Patient meets criteria for sepsis with leukocytosis with WBC 33.3 and tachycardia with heart rate 100 -->95.  with AKI. Lactate is normal, currently hemodynamically stable.  Left lower extremity venous Doppler is negative for DVT Marked leukocytosis Elevated inflammatory markers. --MR showed "Findings of diffuse cellulitis with superficial cyst. Non loculated fluid within the fascial layers. No evidence of osteomyelitis or abscess." --started on vanc/ceftriaxone, with IV clinda added later. Plan: --cont vanc/ceftriaxone --d/c IV clinda --d/c MIVF  # Severe LLE edema # possible lymphedema --LLE edema worsened with development of a layer of edema with multiple blisters, likely due to IVF given since presentation. PLAN: --d/c MIVF --IV lasix 40 mg  x1  Constipation Patient states he is chronically constipated and takes multiple laxatives with minimal relief Likely owing to dietary indiscretions considering his occupation as a [employer] No obstruction or ileus noted on imaging Moderate stool burden Plan: --miralax 34g q2h x5 doses or until BM achieved.    Hypertension Blood pressure 126/75, patient not taking medications at home. -IV hydralazine as needed  Hypokalemia: --replete PRN  AKI (acute kidney injury) (HCC) Creatinine 1.49, BUN 25, no baseline creatinine available,  may be due to dehydration versus sepsis. 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  Severe sepsis 2/2 cellulitis, currently on IV abx, not improving yet.   Consultants:   None  Procedures:   None  Antimicrobials:   Vancomycin  Ceftriaxone  Clindamycin   Subjective: Left leg pain improved, however, swelling worsened with increased number of blisters.  Complained about constipation and asking for aggressive bowel regimen.  Also reported not being able to sleep.  Objective: Vitals:   06/01/20 1543 06/01/20 2325 06/02/20 0759 06/02/20 1553  BP: (!) 157/92 134/87 (!) 141/89 (!) 151/87  Pulse: 99 (!) 105 85 95  Resp: 18 16  18   Temp: 99.9 F (37.7 C) 99.7 F (37.6 C) 97.9 F (36.6 C) 98.8 F (37.1 C)  TempSrc: Oral Oral Oral Oral  SpO2: 99% 95% 99%  98%  Weight:      Height:        Intake/Output Summary (Last 24 hours) at 06/02/2020 1842 Last data filed at 06/02/2020 1700 Gross per 24 hour  Intake 120 ml  Output 1225 ml  Net -1105 ml   Filed Weights   05/31/20 1004  Weight: 136.1 kg    Examination:  Constitutional: NAD, AAOx3 HEENT: conjunctivae and lids normal, EOMI CV: No cyanosis.   RESP: normal respiratory effort, on RA Extremities: Severe edema in LLE up to thigh, with several blisters  SKIN: warm, large ulcer over left shin Neuro: II - XII grossly intact.   Psych: Normal mood and affect.  Appropriate judgement and reason         Data Reviewed: I have personally reviewed following labs and imaging studies  CBC: Recent Labs  Lab 05/31/20 1014 06/01/20 0608 06/02/20 1100  WBC 33.3* 31.4* 31.0*  NEUTROABS 25.8*  --   --   HGB 14.6 13.0 13.2  HCT 43.2 38.4* 39.3  MCV 85.7 87.1 86.9  PLT 361 334 347   Basic Metabolic Panel: Recent Labs  Lab 05/31/20 1014 05/31/20 1326 06/01/20 0608 06/02/20 0513 06/02/20 1100  NA 132*  --  131*  --  134*  K 3.4*  --  3.6  --  4.2  CL 98  --  98  --  99  CO2 22  --  23  --  25  GLUCOSE 129*  --  169*  --  145*  BUN 25*  --  20  --  15  CREATININE 1.49*  --  1.21 1.22 1.03  CALCIUM 8.3*  --  7.8*  --  8.0*  MG  --  2.5*  --   --  2.3   GFR: Estimated Creatinine Clearance: 115.5 mL/min (by C-G formula based on SCr of 1.03 mg/dL). Liver Function Tests: Recent Labs  Lab 05/31/20 1014  AST 26  ALT 36  ALKPHOS 116  BILITOT 1.5*  PROT 8.1  ALBUMIN 3.0*   No results for input(s): LIPASE, AMYLASE in the last 168 hours. No results for input(s): AMMONIA in the last 168 hours. Coagulation Profile: Recent Labs  Lab 05/31/20 1326  INR 1.3*   Cardiac Enzymes: No results for input(s): CKTOTAL, CKMB, CKMBINDEX, TROPONINI in the last 168 hours. BNP (last 3 results) No results for input(s): PROBNP in the last 8760 hours. HbA1C: No results for input(s):  HGBA1C in the last 72 hours. CBG: No results for input(s): GLUCAP in the last 168 hours. Lipid Profile: No results for input(s): CHOL, HDL, LDLCALC, TRIG, CHOLHDL, LDLDIRECT in the last 72 hours. Thyroid Function Tests: No results for input(s): TSH, T4TOTAL, FREET4, T3FREE, THYROIDAB in the last 72 hours. Anemia Panel: No results for input(s): VITAMINB12, FOLATE, FERRITIN, TIBC, IRON, RETICCTPCT in the last 72 hours. Sepsis Labs: Recent Labs  Lab 05/31/20 1014 05/31/20 1214 05/31/20 1326  PROCALCITON  --   --  4.22  LATICACIDVEN 1.7 1.5  --     Recent Results (from the past 240 hour(s))  Blood culture (routine x 2)     Status: None (  Preliminary result)   Collection Time: 05/31/20 10:14 AM   Specimen: BLOOD  Result Value Ref Range Status   Specimen Description BLOOD LEFT ASSIST CONTROL  Final   Special Requests   Final    BOTTLES DRAWN AEROBIC AND ANAEROBIC Blood Culture results may not be optimal due to an excessive volume of blood received in culture bottles   Culture   Final    NO GROWTH 2 DAYS Performed at [facility], [address] [place], [place] [address]    Report Status PENDING  Incomplete  Blood culture (routine x 2)     Status: None (Preliminary result)   Collection Time: 05/31/20  1:48 PM   Specimen: BLOOD  Result Value Ref Range Status   Specimen Description BLOOD BLOOD LEFT FOREARM  Final   Special Requests   Final    BOTTLES DRAWN AEROBIC AND ANAEROBIC Blood Culture adequate volume   Culture   Final    NO GROWTH 2 DAYS Performed at [facility], [address], [place], [place] [address]    Report Status PENDING  Incomplete  Respiratory Panel by RT PCR (Flu A&B, Covid) - Nasopharyngeal Swab     Status: None   Collection Time: 05/31/20  3:02 PM   Specimen: Nasopharyngeal Swab  Result Value Ref Range Status   SARS Coronavirus 2 by RT PCR NEGATIVE NEGATIVE Final    Comment: (NOTE) SARS-CoV-2 target nucleic acids are NOT  DETECTED.  The SARS-CoV-2 RNA is generally detectable in upper respiratoy specimens during the acute phase of infection. The lowest concentration of SARS-CoV-2 viral copies this assay can detect is 131 copies/mL. A negative result does not preclude SARS-Cov-2 infection and should not be used as the sole basis for treatment or other patient management decisions. A negative result may occur with  improper specimen collection/handling, submission of specimen other than nasopharyngeal swab, presence of viral mutation(s) within the areas targeted by this assay, and inadequate number of viral copies (<131 copies/mL). A negative result must be combined with clinical observations, patient history, and epidemiological information. The expected result is Negative.  Fact Sheet for Patients:  https://www.moore.com/  Fact Sheet for Healthcare Providers:  https://www.young.biz/  This test is no t yet approved or cleared by the [place] FDA and  has been authorized for detection and/or diagnosis of SARS-CoV-2 by FDA under an Emergency Use Authorization (EUA). This EUA will remain  in effect (meaning this test can be used) for the duration of the COVID-19 declaration under Section 564(b)(1) of the Act, 21 U.S.C. section 360bbb-3(b)(1), unless the authorization is terminated or revoked sooner.     Influenza A by PCR NEGATIVE NEGATIVE Final   Influenza B by PCR NEGATIVE NEGATIVE Final    Comment: (NOTE) The Xpert Xpress SARS-CoV-2/FLU/RSV assay is intended as an aid in  the diagnosis of influenza from Nasopharyngeal swab specimens and  should not be used as a sole basis for treatment. Nasal washings and  aspirates are unacceptable for Xpert Xpress SARS-CoV-2/FLU/RSV  testing.  Fact Sheet for Patients: https://www.moore.com/  Fact Sheet for Healthcare Providers: https://www.young.biz/  This test is not yet  approved or cleared by the [place] FDA and  has been authorized for detection and/or diagnosis of SARS-CoV-2 by  FDA under an Emergency Use Authorization (EUA). This EUA will remain  in effect (meaning this test can be used) for the duration of the  Covid-19 declaration under Section 564(b)(1) of the Act, 21  U.S.C. section 360bbb-3(b)(1), unless the authorization  is  terminated or revoked. Performed at [facility], [address], [place], [place] [address]          Radiology Studies: DG Cervical Spine 2 or 3 views  Result Date: 06/01/2020 CLINICAL DATA:  Knot in the posterior aspect of the neck. EXAM: CERVICAL SPINE - 2-3 VIEW COMPARISON:  Radiographs 08/07/2017 FINDINGS: Limited evaluation from C6 inferiorly secondary to overlapping soft tissues on the lateral radiograph. No evidence of acute fracture or traumatic malalignment. Similar straightening of the normal cervical lordosis. Similar moderate disc height loss and degenerative change at C5-C6 and C6-C7. Similar large anteriorly directed osteophytes. Similar long gated bilateral styloid processes. Prevertebral soft tissues are within normal limits. Limited evaluation of the soft tissues by radiograph. IMPRESSION: No evidence of acute osseous abnormality. Similar moderate degenerative changes, as detailed above. Electronically Signed   By: Feliberto Harts MD   On: 06/01/2020 08:49   DG Abd 1 View  Result Date: 06/01/2020 CLINICAL DATA:  Constipation EXAM: ABDOMEN - 1 VIEW COMPARISON:  None. FINDINGS: The bowel gas pattern is nonobstructive. Mild to moderate volume stool throughout the colon. No radio-opaque calculi or other significant radiographic abnormality are seen. IMPRESSION: Nonobstructive bowel gas pattern. Mild to moderate volume stool throughout the colon. Electronically Signed   By: Duanne Guess D.O.   On: 06/01/2020 08:48   MR TIBIA FIBULA LEFT W WO CONTRAST  Result Date: 06/01/2020 CLINICAL  DATA:  Suspect osteomyelitis, diffuse cellulitis EXAM: MRI OF LOWER LEFT EXTREMITY WITHOUT AND WITH CONTRAST TECHNIQUE: Multiplanar, multisequence MR imaging of the left was performed both before and after administration of intravenous contrast. CONTRAST:  39mL GADAVIST GADOBUTROL 1 MMOL/ML IV SOLN COMPARISON:  None. FINDINGS: Bones/Joint/Cartilage No areas of osseous destruction or periosteal reaction. Normal osseous marrow signal seen throughout. There tibial surfaces appear to be maintained. Ligaments Suboptimally visualized Muscles and Tendons The muscles surrounding the lower extremity are intact without focal atrophy or tear. The visualized portions of the tendons are intact. Soft tissues There is diffuse skin thickening with edema and subcutaneous edema extending to the deep fascial layers. There is non loculated fluid within the deep fascial layers. Overlying superficial epidermal cyst/boils are seen. IMPRESSION: Findings of diffuse cellulitis with superficial cyst. Non loculated fluid within the fascial layers. No evidence of osteomyelitis or abscess. Electronically Signed   By: Jonna Clark M.D.   On: 06/01/2020 20:54        Scheduled Meds:  enoxaparin (LOVENOX) injection  0.5 mg/kg Subcutaneous Q24H   hydrOXYzine  50 mg Oral QHS   lactulose  20 g Oral Daily   lidocaine  1 patch Transdermal Q24H   polyethylene glycol  34 g Oral Q2H   senna-docusate  1 tablet Oral BID   Continuous Infusions:  cefTRIAXone (ROCEPHIN)  IV 2 g (06/02/20 1428)   methocarbamol (ROBAXIN) IV     vancomycin 1,000 mg (06/02/20 1630)     LOS: 2 days   Darlin Priestly, MD [facility] Pager [phone]  If 7PM-7AM, please contact night-coverage 06/02/2020, 6:42 PM

## 2020-06-02 NOTE — Progress Notes (Signed)
Formatting of this note is different from the original.  Images from the original note were not included.    PROGRESS NOTE    Thomas Browning  ONG:[id number] DOB: 1966-01-19 DOA: 05/31/2020  PCP: Patient, No Pcp Per   Brief Narrative:   54 y.o. male with medical history significant of HTN, who presents with left leg pain, swelling, weeping.    Patient states that his left leg pain started approximately 5 days ago, which has been progressively worsening.  Left leg is erythematous, swelling, with significant blistering, open wound and weeping from several popped blisters. The pain is constant, sharp, moderate to severe, nonradiating. 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  Ultrasound on admission negative.    Patient presented with left leg complaints also complains of constipation neck pain.  Started on broad-spectrum IV antibiotics on admission.    Patient's main complaints are constipation and neck pain.  KUB negative for obstruction.  X-ray C-spine negative for fracture.    Assessment & Plan:    Principal Problem:    Left leg cellulitis  Active Problems:    Hypertension    Sepsis (HCC)    Hypokalemia    AKI (acute kidney injury) (HCC)    Cellulitis of left leg    Severe Sepsis due to   left leg cellulitis:   Patient meets criteria for sepsis with leukocytosis with WBC 33.3 and tachycardia with heart rate 100 -->95.  with AKI.  Lactate is normal, currently hemodynamically stable.   Left lower extremity venous Doppler is negative for DVT  Marked leukocytosis  Elevated inflammatory markers.  --MR showed "Findings of diffuse cellulitis with superficial cyst. Non loculated fluid within the fascial layers. No evidence of osteomyelitis or abscess."  --started on vanc/ceftriaxone, with IV clinda added later.  Plan:  --cont vanc/ceftriaxone  --d/c IV clinda  --d/c MIVF    # Severe LLE edema  # possible lymphedema  --LLE edema worsened with development of a layer of edema with multiple blisters, likely  due to IVF given since presentation.  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      Hypertension  Blood pressure 126/75, patient not taking medications at home.  -IV hydralazine as needed    Hypokalemia:  --replete PRN    AKI (acute kidney injury) (HCC)  Creatinine 1.49, BUN 25, no baseline creatinine available,   may be due to dehydration versus sepsis.  --Cr improved to 1.03 after IVF and tx of cellulitis  Plan:  --d/c MIVF    Cervicalgia  Patient is a truck driver  Exam significant for rigid lump in posterior neck  Noted to soft tissue on x-ray  C-spine x-ray negative for fracture  Plan:  --lidocaine patch    Morbid obesity  BMI 41.8  This complicates care and prognosis    DVT prophylaxis: Lovenox  Code Status: Full  Family Communication:  Mother updated at bedside today  Status is: Inpatient    Dispo: The patient is from: Home               Anticipated d/c is to: Home  Anticipated d/c date is: 3 days               Patient currently is not medically stable to d/c.  Severe sepsis 2/2 cellulitis, currently on IV abx, not improving yet.    Consultants:    None    Procedures:    None    Antimicrobials:    Vancomycin   Ceftriaxone   Clindamycin    Subjective:  Left leg pain improved, however, swelling worsened with increased number of blisters.  Complained about constipation and asking for aggressive bowel regimen.  Also reported not being able to sleep.    Objective:  Vitals:    06/01/20 1543 06/01/20 2325 06/02/20 0759 06/02/20 1553   BP: (!) 157/92 134/87 (!) 141/89 (!) 151/87   Pulse: 99 (!) 105 85 95   Resp: 18 16  18    Temp: 99.9 F (37.7 C) 99.7 F (37.6 C) 97.9 F (36.6 C) 98.8  F (37.1 C)   TempSrc: Oral Oral Oral Oral   SpO2: 99% 95% 99% 98%   Weight:       Height:         Intake/Output Summary (Last 24 hours) at 06/02/2020 1842  Last data filed at 06/02/2020 1700  Gross per 24 hour   Intake 120 ml   Output 1225 ml   Net -1105 ml     Filed Weights    05/31/20 1004   Weight: 136.1 kg     Examination:    Constitutional: NAD, AAOx3  HEENT: conjunctivae and lids normal, EOMI  CV: No cyanosis.    RESP: normal respiratory effort, on RA  Extremities: Severe edema in LLE up to thigh, with several blisters   SKIN: warm, large ulcer over left shin  Neuro: II - XII grossly intact.    Psych: Normal mood and affect.  Appropriate judgement and reason    Data Reviewed: I have personally reviewed following labs and imaging studies    CBC:  Recent Labs   Lab 05/31/20  1014 06/01/20  0608 06/02/20  1100   WBC 33.3* 31.4* 31.0*   NEUTROABS 25.8*  --   --    HGB 14.6 13.0 13.2   HCT 43.2 38.4* 39.3   MCV 85.7 87.1 86.9   PLT 361 334 347     Basic Metabolic Panel:  Recent Labs   Lab 05/31/20  1014 05/31/20  1326 06/01/20  0608 06/02/20  0513 06/02/20  1100   NA 132*  --  131*  --  134*   K 3.4*  --  3.6  --  4.2   CL 98  --  98  --  99   CO2 22  --  23  --  25   GLUCOSE 129*  --  169*  --  145*   BUN 25*  --  20  --  15   CREATININE 1.49*  --  1.21 1.22 1.03   CALCIUM 8.3*  --  7.8*  --  8.0*   MG  --  2.5*  --   --  2.3     GFR:  Estimated Creatinine Clearance: 115.5 mL/min (by C-G formula based on SCr of 1.03 mg/dL).  Liver Function Tests:  Recent Labs   Lab 05/31/20  1014   AST 26   ALT 36   ALKPHOS 116   BILITOT 1.5*   PROT 8.1   ALBUMIN 3.0*     No results for input(s): LIPASE, AMYLASE in the  last 168 hours.  No results for input(s): AMMONIA in the last 168 hours.  Coagulation Profile:  Recent Labs   Lab 05/31/20  1326   INR 1.3*     Cardiac Enzymes:  No results for input(s): CKTOTAL, CKMB, CKMBINDEX, TROPONINI in the last 168 hours.  BNP (last 3 results)  No results for input(s): PROBNP in the last  8760 hours.  HbA1C:  No results for input(s): HGBA1C in the last 72 hours.  CBG:  No results for input(s): GLUCAP in the last 168 hours.  Lipid Profile:  No results for input(s): CHOL, HDL, LDLCALC, TRIG, CHOLHDL, LDLDIRECT in the last 72 hours.  Thyroid Function Tests:  No results for input(s): TSH, T4TOTAL, FREET4, T3FREE, THYROIDAB in the last 72 hours.  Anemia Panel:  No results for input(s): VITAMINB12, FOLATE, FERRITIN, TIBC, IRON, RETICCTPCT in the last 72 hours.  Sepsis Labs:  Recent Labs   Lab 05/31/20  1014 05/31/20  1214 05/31/20  1326   PROCALCITON  --   --  4.22   LATICACIDVEN 1.7 1.5  --      Recent Results (from the past 240 hour(s))   Blood culture (routine x 2)     Status: None (Preliminary result)    Collection Time: 05/31/20 10:14 AM    Specimen: BLOOD   Result Value Ref Range Status    Specimen Description BLOOD LEFT ASSIST CONTROL  Final    Special Requests   Final     BOTTLES DRAWN AEROBIC AND ANAEROBIC Blood Culture results may not be optimal due to an excessive volume of blood received in culture bottles    Culture   Final     NO GROWTH 2 DAYS  Performed at [facility], [address] [place], [place] [address]     Report Status PENDING  Incomplete   Blood culture (routine x 2)     Status: None (Preliminary result)    Collection Time: 05/31/20  1:48 PM    Specimen: BLOOD   Result Value Ref Range Status    Specimen Description BLOOD BLOOD LEFT FOREARM  Final    Special Requests   Final     BOTTLES DRAWN AEROBIC AND ANAEROBIC Blood Culture adequate volume    Culture   Final     NO GROWTH 2 DAYS  Performed at [facility], [address], [place], [place] [address]     Report Status PENDING  Incomplete   Respiratory Panel by RT PCR (Flu A&B, Covid) - Nasopharyngeal Swab     Status: None    Collection Time: 05/31/20  3:02 PM    Specimen: Nasopharyngeal Swab   Result Value Ref Range Status    SARS Coronavirus 2 by RT PCR NEGATIVE NEGATIVE Final     Comment: (NOTE)  SARS-CoV-2  target nucleic acids are NOT DETECTED.    The SARS-CoV-2 RNA is generally detectable in upper respiratoy  specimens during the acute phase of infection. The lowest  concentration of SARS-CoV-2 viral copies this assay can detect is  131 copies/mL. A negative result does not preclude SARS-Cov-2  infection and should not be used as the sole basis for treatment or  other patient management decisions. A negative result may occur with   improper specimen collection/handling, submission of specimen other  than nasopharyngeal swab, presence of viral mutation(s) within the  areas targeted by this assay, and inadequate number of viral copies  (<131 copies/mL). A negative result must be combined with clinical  observations,  patient history, and epidemiological information. The  expected result is Negative.    Fact Sheet for Patients:   https://www.moore.com/    Fact Sheet for Healthcare Providers:   https://www.young.biz/    This test is no t yet approved or cleared by the [place] FDA and   has been authorized for detection and/or diagnosis of SARS-CoV-2 by  FDA under an Emergency Use Authorization (EUA). This EUA will remain   in effect (meaning this test can be used) for the duration of the  COVID-19 declaration under Section 564(b)(1) of the Act, 21 U.S.C.  section 360bbb-3(b)(1), unless the authorization is terminated or  revoked sooner.       Influenza A by PCR NEGATIVE NEGATIVE Final    Influenza B by PCR NEGATIVE NEGATIVE Final     Comment: (NOTE)  The Xpert Xpress SARS-CoV-2/FLU/RSV assay is intended as an aid in   the diagnosis of influenza from Nasopharyngeal swab specimens and   should not be used as a sole basis for treatment. Nasal washings and   aspirates are unacceptable for Xpert Xpress SARS-CoV-2/FLU/RSV   testing.    Fact Sheet for Patients:  https://www.moore.com/    Fact Sheet for Healthcare  Providers:  https://www.young.biz/    This test is not yet approved or cleared by the [place] FDA and   has been authorized for detection and/or diagnosis of SARS-CoV-2 by   FDA under an Emergency Use Authorization (EUA). This EUA will remain   in effect (meaning this test can be used) for the duration of the   Covid-19 declaration under Section 564(b)(1) of the Act, 21   U.S.C. section 360bbb-3(b)(1), unless the authorization is   terminated or revoked.  Performed at [facility], [address] [place],  [place] [address]        Radiology Studies:  DG Cervical Spine 2 or 3 views    Result Date: 06/01/2020  CLINICAL DATA:  Knot in the posterior aspect of the neck. EXAM: CERVICAL SPINE - 2-3 VIEW COMPARISON:  Radiographs 08/07/2017 FINDINGS: Limited evaluation from C6 inferiorly secondary to overlapping soft tissues on the lateral radiograph. No evidence of acute fracture or traumatic malalignment. Similar straightening of the normal cervical lordosis. Similar moderate disc height loss and degenerative change at C5-C6 and C6-C7. Similar large anteriorly directed osteophytes. Similar long gated bilateral styloid processes. Prevertebral soft tissues are within normal limits. Limited evaluation of the soft tissues by radiograph. IMPRESSION: No evidence of acute osseous abnormality. Similar moderate degenerative changes, as detailed above. Electronically Signed   By: Feliberto Harts MD   On: 06/01/2020 08:49     DG Abd 1 View    Result Date: 06/01/2020  CLINICAL DATA:  Constipation EXAM: ABDOMEN - 1 VIEW COMPARISON:  None. FINDINGS: The bowel gas pattern is nonobstructive. Mild to moderate volume stool throughout the colon. No radio-opaque calculi or other significant radiographic abnormality are seen. IMPRESSION: Nonobstructive bowel gas pattern. Mild to moderate volume stool throughout the colon. Electronically Signed   By: Duanne Guess D.O.   On: 06/01/2020 08:48     MR TIBIA  FIBULA LEFT W WO CONTRAST    Result Date: 06/01/2020  CLINICAL DATA:  Suspect osteomyelitis, diffuse cellulitis EXAM: MRI OF LOWER LEFT EXTREMITY WITHOUT AND WITH CONTRAST TECHNIQUE: Multiplanar, multisequence MR imaging of the left was performed both before and after administration of intravenous contrast. CONTRAST:  10mL GADAVIST GADOBUTROL 1 MMOL/ML IV SOLN COMPARISON:  None. FINDINGS: Bones/Joint/Cartilage No areas of osseous destruction  or periosteal reaction. Normal osseous marrow signal seen throughout. There tibial surfaces appear to be maintained. Ligaments Suboptimally visualized Muscles and Tendons The muscles surrounding the lower extremity are intact without focal atrophy or tear. The visualized portions of the tendons are intact. Soft tissues There is diffuse skin thickening with edema and subcutaneous edema extending to the deep fascial layers. There is non loculated fluid within the deep fascial layers. Overlying superficial epidermal cyst/boils are seen. IMPRESSION: Findings of diffuse cellulitis with superficial cyst. Non loculated fluid within the fascial layers. No evidence of osteomyelitis or abscess. Electronically Signed   By: Jonna Clark M.D.   On: 06/01/2020 20:54     Scheduled Meds:  ? enoxaparin (LOVENOX) injection  0.5 mg/kg Subcutaneous Q24H   ? hydrOXYzine  50 mg Oral QHS   ? lactulose  20 g Oral Daily   ? lidocaine  1 patch Transdermal Q24H   ? polyethylene glycol  34 g Oral Q2H   ? senna-docusate  1 tablet Oral BID     Continuous Infusions:  ? cefTRIAXone (ROCEPHIN)  IV 2 g (06/02/20 1428)   ? methocarbamol (ROBAXIN) IV     ? vancomycin 1,000 mg (06/02/20 1630)      LOS: 2 days     Darlin Priestly, MD  [facility]  Pager [phone]    If 7PM-7AM, please contact night-coverage  06/02/2020, 6:42 PM   Electronically signed by Darlin Priestly, MD at 06/03/2020  3:12 AM EDT

## 2020-06-02 NOTE — Unmapped (Signed)
Formatting of this note is different from the original.  Physical Therapy Evaluation  Patient Details  Name: Thomas Browning  MRN: [id number]  DOB: [birth year]  Today's Date: 06/02/2020    History of Present Illness   Pt is a 54 y.o. male presenting to hospital 10/25 with L leg redness, swelling, pain, and weapage.  Pt admitted with sepsis d/t L leg cellulitis, htn, hypokalemia, AKI, and cervicalgia (rigid lump posterior neck).  PMH includes htn.   Clinical Impression   Prior to hospital admission, pt was independent with ambulation.  Currently pt is SBA with bed mobility; SBA with transfers; and CGA ambulating with RW 30 feet (limited distance ambulating d/t L LE pain increasing to 10/10; L LE pain 5/10 at rest beginning/end of session).  Vc's required for walker use and to increase UE support through walker to offweight L LE during ambulation to improve pain control.  Pt would benefit from skilled PT to address noted impairments and functional limitations (see below for any additional details).  Upon hospital discharge, pt would benefit from HHPT.    Follow Up Recommendations Home health PT      Equipment Recommendations   Rolling walker with 5" wheels;3in1 (PT)     Recommendations for Other Services OT consult       Precautions / Restrictions Precautions  Precautions: Fall  Precaution Comments: LLE wound  Restrictions  Weight Bearing Restrictions: No         Mobility   Bed Mobility  Overal bed mobility: Needs Assistance  Bed Mobility: Supine to Sit;Sit to Supine      Supine to sit: Supervision;HOB elevated  Sit to supine: Supervision;HOB elevated    General bed mobility comments: mild increased effort to move L LE d/t pain and swelling      Transfers  Overall transfer level: Needs assistance  Equipment used: Rolling walker (2 wheeled);None  Transfers: Sit to/from Stand  Sit to Stand: Supervision          General transfer comment: increased effort to stand with UE support; pt standing mostly with weight through R LE  (d/t L LE pain)    Ambulation/Gait  Ambulation/Gait assistance: Min guard  Gait Distance (Feet): 30 Feet  Assistive device: Rolling walker (2 wheeled)    Gait velocity: decreased    General Gait Details: antalgic; decreased stance time L LE; vc's to stay closer to RW and to increase UE support through RW to offweight L LE (d/t L LE pain)    Stairs              Wheelchair Mobility      Modified Rankin (Stroke Patients Only)          Balance Overall balance assessment: Needs assistance  Sitting-balance support: No upper extremity supported;Feet supported  Sitting balance-Leahy Scale: Good  Sitting balance - Comments: steady sitting reaching within BOS      Standing balance-Leahy Scale: Fair  Standing balance comment: steady standing without UE support although pt with most of weight through R LE                            Pertinent Vitals/Pain Pain Assessment: 0-10  Pain Score: 5  (10/10 with walking; 5/10 at rest beginning/end of session)  Pain Location: LLE  Pain Descriptors / Indicators: Aching;Throbbing;Sharp  Pain Intervention(s): Limited activity within patient's tolerance;Monitored during session;Repositioned;Other (comment) (pt reported he would call for pain medication when he wanted it)  Vitals (HR and O2 on room air) stable and WFL throughout treatment session.     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     Hand Dominance    Dominant Hand: Right      Extremity/Trunk Assessment    Upper Extremity Assessment  Upper Extremity Assessment: Overall WFL for tasks assessed      Lower  Extremity Assessment  Lower Extremity Assessment:  (R LE WFL)  LLE Deficits / Details: hip flexion at least 3/5 (limited d/t L LE pain); knee flexion/extension AROM limited d/t L LE pain; DF to at least neutral (limited ROM d/t swelling and L LE pain)  LLE: Unable to fully assess due to pain      Cervical / Trunk Assessment  Cervical / Trunk Assessment: Normal   Communication    Communication: No difficulties   Cognition Arousal/Alertness: Awake/alert  Behavior During Therapy: WFL for tasks assessed/performed  Overall Cognitive Status: Within Functional Limits for tasks assessed                                          General Comments General comments (skin integrity, edema, etc.): L LE red, warm, edematous; chucks underneath to collect fluid.  Nursing cleared pt for participation in physical therapy.  Pt agreeable to PT session.  Pt reports he has been walking to bathroom on his own with IV pole.      Exercises       Assessment/Plan     PT Assessment Patient needs continued PT services   PT Problem List Decreased strength;Decreased activity tolerance;Decreased balance;Decreased mobility;Decreased knowledge of use of DME;Pain;Decreased skin integrity        PT Treatment Interventions DME instruction;Gait training;Stair training;Functional mobility training;Therapeutic activities;Therapeutic exercise;Balance training;Patient/family education      PT Goals (Current goals can be found in the Care Plan section)   Acute Rehab PT Goals  Patient Stated Goal: to improve L LE pain and mobility  PT Goal Formulation: With patient  Time For Goal Achievement: 06/16/20  Potential to Achieve Goals: Good      Frequency Min 2X/week    Barriers to discharge        Co-evaluation                AM-PAC PT "6 Clicks" Mobility   Outcome Measure Help needed turning from your back to your side while in a flat bed without using bedrails?: None  Help needed moving from lying on your back to sitting on the side of a flat bed without using  bedrails?: A Little  Help needed moving to and from a bed to a chair (including a wheelchair)?: A Little  Help needed standing up from a chair using your arms (e.g., wheelchair or bedside chair)?: A Little  Help needed to walk in hospital room?: A Little  Help needed climbing 3-5 steps with a railing? : A Little  6 Click Score:  19      End of Session Equipment Utilized During Treatment: Gait belt  Activity Tolerance: Patient limited by pain  Patient left: in bed;with call bell/phone within reach  Nurse Communication: Mobility status;Precautions;Other (comment) (via white board)  PT Visit Diagnosis: Other abnormalities of gait and mobility (R26.89);Difficulty in walking, not elsewhere classified (R26.2);Pain  Pain - Right/Left: Left  Pain - part of body: Leg      Time: 0927-0955  PT Time Calculation (min) (ACUTE ONLY): 28 min    Charges:   PT Evaluation  $PT Eval Low Complexity: 1 Low  PT Treatments  $Gait Training: 8-22 mins        Hendricks Limes, PT  06/02/20, 11:36 AM      Electronically signed by Hendricks Limes, PT at 06/02/2020 11:43 AM EDT

## 2020-06-02 NOTE — Unmapped (Signed)
Formatting of this note is different from the original.  Transition of Care [facility]) - Initial/Assessment Note     Patient Details   Name: Thomas Browning  MRN: [id number]  Date of Birth: [birth year]    Transition of Care [facility]) CM/SW Contact:     Trenton Founds, RN  Phone Number:  06/02/2020, 3:17 PM    Clinical Narrative:     RNCM met with patient in room, patient lying in bed with mother at bedside. Patient reports to feeling somewhat better today. Patient is normally independent at home Discussed discharge planning and that at this time therapy dept is recommending PT at home as well as a rolling walker and 3N1. Patient is agreeable to home health as well as walker but does not feel he needs 3n1 at this time. Discussed PCP and patient reports he doesn't have one but he is agreeable to [facility] application and this was provided to patient.     RNCM reached out to [place] with [facility] and they can accept patient for PT only but they do not have availability with nursing at this time. She reported they could take him for PT.   RNCM reached out to [facility] with [facility] and they will provide patient with rolling walker             Expected Discharge Plan: Home w Home Health Services  Barriers to Discharge: Continued Medical Work up    Patient Goals and CMS Choice          Expected Discharge Plan and Services  Expected Discharge Plan: Home w Home Health Services        Living arrangements for the past 2 months: Single Family Home    DME Arranged: Walker rolling  DME Agency: [employer]  Date DME Agency Contacted: 06/02/20  Time DME Agency Contacted: [phone]  Representative spoke with at DME Agency: Zack  HH Arranged: PT  HH Agency: [facility] ([facility])  Date HH Agency Contacted: 06/02/20  Time HH Agency Contacted: [phone]  Representative spoke with at [facility] Agency: Rosey Bath    Prior Living Arrangements/Services  Living arrangements for the past 2 months: Single Family Home  Lives with:: Self  Patient language  and need for interpreter reviewed:: Yes    Need for Family Participation in Patient Care: Yes (Comment)  Care giver support system in place?: Yes (comment)    Criminal Activity/Legal Involvement Pertinent to Current Situation/Hospitalization: No - Comment as needed    Activities of Daily Living  Home Assistive Devices/Equipment: None  ADL Screening (condition at time of admission)  Patient's cognitive ability adequate to safely complete daily activities?: Yes  Is the patient deaf or have difficulty hearing?: No  Does the patient have difficulty seeing, even when wearing glasses/contacts?: No  Does the patient have difficulty concentrating, remembering, or making decisions?: No  Patient able to express need for assistance with ADLs?: Yes  Does the patient have difficulty dressing or bathing?: No  Independently performs ADLs?: Yes (appropriate for developmental age)  Does the patient have difficulty walking or climbing stairs?: Yes  Weakness of Legs: Left  Weakness of Arms/Hands: None    Permission Sought/Granted                Emotional Assessment  Appearance:: Appears stated age    Affect (typically observed): Appropriate  Orientation: : Oriented to Self, Oriented to Place, Oriented to  Time, Oriented to Situation  Alcohol / Substance Use: Not Applicable  Psych Involvement: No (comment)    Admission diagnosis:  Cellulitis of left leg [L03.116]  Cellulitis of left lower extremity [L03.116]  Patient Active Problem List    Diagnosis Date Noted   ? Left leg cellulitis 05/31/2020   ? Cellulitis of left leg 05/31/2020   ? Hypertension    ? Sepsis (HCC)    ? Hypokalemia    ? AKI (acute kidney injury) (HCC)      PCP:  Patient, No Pcp Per  Pharmacy:  No Pharmacies Listed    Social Determinants of Health (SDOH) Interventions      Readmission Risk Interventions  No flowsheet data found.    Electronically signed by Trenton Founds, RN at 06/02/2020  3:31 PM EDT

## 2020-06-02 NOTE — Unmapped (Signed)
Formatting of this note might be different from the original.    Problem: Education:  Goal: Knowledge of General Education information will improve  Description: Including pain rating scale, medication(s)/side effects and non-pharmacologic comfort measures  Outcome: Progressing    Problem: Health Behavior/Discharge Planning:  Goal: Ability to manage health-related needs will improve  Outcome: Progressing    Problem: Clinical Measurements:  Goal: Will remain free from infection  Outcome: Progressing    Problem: Activity:  Goal: Risk for activity intolerance will decrease  Outcome: Progressing    Problem: Coping:  Goal: Level of anxiety will decrease  Outcome: Progressing    Problem: Pain Managment:  Goal: General experience of comfort will improve  Outcome: Progressing    Problem: Safety:  Goal: Ability to remain free from injury will improve  Outcome: Progressing    Problem: Skin Integrity:  Goal: Risk for impaired skin integrity will decrease  Outcome: Progressing    Electronically signed by Dillard Essex, RN at 06/02/2020  1:45 PM EDT

## 2020-06-03 LAB — BASIC METABOLIC PANEL
Anion gap: 9 (ref 5–15)
BUN: 14 mg/dL (ref 6–20)
CO2: 24 mmol/L (ref 22–32)
Calcium: 7.7 mg/dL — ABNORMAL LOW (ref 8.9–10.3)
Chloride: 103 mmol/L (ref 98–111)
Creatinine, Ser: 1.1 mg/dL (ref 0.61–1.24)
GFR, Estimated: >60 mL/min (ref >60)
Glucose, Bld: 106 mg/dL — ABNORMAL HIGH (ref 70–99)
Potassium: 4.4 mmol/L (ref 3.5–5.1)
Sodium: 136 mmol/L (ref 135–145)

## 2020-06-03 LAB — MAGNESIUM: Magnesium: 2.3 mg/dL (ref 1.7–2.4)

## 2020-06-03 LAB — CBC
HCT: 36.8 % — ABNORMAL LOW (ref 39.0–52.0)
Hemoglobin: 12.3 g/dL — ABNORMAL LOW (ref 13.0–17.0)
MCH: 29.4 pg (ref 26.0–34.0)
MCHC: 33.4 g/dL (ref 30.0–36.0)
MCV: 87.8 fL (ref 80.0–100.0)
Platelets: 356 10*3/uL (ref 150–400)
RBC: 4.19 MIL/uL — ABNORMAL LOW (ref 4.22–5.81)
RDW: 15.6 % — ABNORMAL HIGH (ref 11.5–15.5)
WBC: 30 10*3/uL — ABNORMAL HIGH (ref 4.0–10.5)
nRBC: 0 % (ref 0.0–0.2)

## 2020-06-03 MED ORDER — FUROSEMIDE 10 MG/ML IJ SOLN
40.0000 mg | Freq: Once | INTRAMUSCULAR | Status: AC
  Administered 2020-06-03: 40 mg via INTRAVENOUS
  Filled 2020-06-03: qty 4

## 2020-06-03 MED ORDER — LINEZOLID 600 MG/300ML IV SOLN
600.0000 mg | Freq: Two times a day (BID) | INTRAVENOUS | Status: DC
  Administered 2020-06-03 – 2020-06-07 (×8): 600 mg via INTRAVENOUS
  Filled 2020-06-03 (×11): qty 300

## 2020-06-03 MED ORDER — POLYETHYLENE GLYCOL 3350 17 G PO PACK
34.0000 g | PACK | ORAL | Status: AC
  Administered 2020-06-03 – 2020-06-04 (×5): 34 g via ORAL
  Filled 2020-06-03 (×5): qty 2

## 2020-06-03 NOTE — Progress Notes (Signed)
PT Cancellation Note  Patient Details Name: Stephen [name] MRN: [id number] DOB: 11/03/1965   Cancelled Treatment:    Reason Eval/Treat Not Completed: Pain limiting ability to participate.  Nurse preparing to give pt pain medication upon PT arrival.  Pt reporting 6/10 L LE pain and declining therapy d/t L LE pain and wanting pain medication to take effect first.  Unfortunately therapist unable to return this afternoon.  Pt reports he has been getting up and walking to bathroom on his own.  Will re-attempt PT treatment session at a later date/time.  Hendricks Limes, PT 06/03/20, 4:18 PM

## 2020-06-03 NOTE — Progress Notes (Signed)
Occupational Therapy Treatment Patient Details Name: Stephen [name] MRN: [id number] DOB: [birth year] Today's Date: 06/03/2020    History of present illness Pt is a 54 y.o. male presenting to hospital 10/25 with L leg redness, swelling, pain, and weapage.  Pt admitted with sepsis d/t L leg cellulitis, htn, hypokalemia, AKI, and cervicalgia (rigid lump posterior neck).  PMH includes htn.   OT comments  Pt seen for OT tx this date. Pt agreeable, endorses doing much better. Reports 4-5/10 LLE pain and recently took pain medication to address it. Pt instructed in AE/DME and home/routines modifications to max safety and comfort for ADL. Pt verbalized understanding. Pt continues to benefit from skilled OT services while hospitalized.    Follow Up Recommendations  No OT follow up    Equipment Recommendations  3 in 1 bedside commode    Recommendations for Other Services      Precautions / Restrictions Precautions Precautions: Fall Precaution Comments: LLE wound Restrictions Weight Bearing Restrictions: No       Mobility Bed Mobility                  Transfers                      Balance                                           ADL either performed or assessed with clinical judgement   ADL                                               Vision       Perception     Praxis      Cognition Arousal/Alertness: Awake/alert Behavior During Therapy: WFL for tasks assessed/performed Overall Cognitive Status: Within Functional Limits for tasks assessed                                          Exercises Other Exercises Other Exercises: Pt instructed in AE/DME and home/routines modifications to max safety and comfort for ADL   Shoulder Instructions       General Comments      Pertinent Vitals/ Pain       Pain Assessment: 0-10 Pain Score: 5  Pain Location: LLE Pain Descriptors / Indicators:  Aching;Throbbing;Sharp Pain Intervention(s): Limited activity within patient's tolerance;Monitored during session;Premedicated before session  Home Living                                          Prior Functioning/Environment              Frequency  Min 1X/week        Progress Toward Goals  OT Goals(current goals can now be found in the care plan section)  Progress towards OT goals: Progressing toward goals  Acute Rehab OT Goals Patient Stated Goal: to improve L LE pain and mobility OT Goal Formulation: With patient Time For Goal Achievement: 06/15/20 Potential to Achieve Goals: Good  Plan Discharge plan remains appropriate;Frequency remains appropriate  Co-evaluation                 AM-PAC OT "6 Clicks" Daily Activity     Outcome Measure   Help from another person eating meals?: None Help from another person taking care of personal grooming?: None Help from another person toileting, which includes using toliet, bedpan, or urinal?: None Help from another person bathing (including washing, rinsing, drying)?: A Little Help from another person to put on and taking off regular upper body clothing?: None Help from another person to put on and taking off regular lower body clothing?: A Little 6 Click Score: 22    End of Session    OT Visit Diagnosis: Other abnormalities of gait and mobility (R26.89);Pain Pain - Right/Left: Left Pain - part of body: Leg   Activity Tolerance Patient tolerated treatment well   Patient Left in bed;with call bell/phone within reach   Nurse Communication          Time: 0175-1025 OT Time Calculation (min): 10 min  Charges: OT General Charges $OT Visit: 1 Visit OT Treatments $Self Care/Home Management : 8-22 mins  Richrd Prime, MPH, MS, OTR/L ascom [phone] 06/03/20, 1:48 PM

## 2020-06-03 NOTE — Progress Notes (Signed)
Pharmacy Antibiotic Note  Stephen [name] is a 54 y.o. male admitted on 05/31/2020 with left leg cellulitis. Patient reported LLE pain started about 5 days go. According to admission note leg is swallow with significant blistering.  Pharmacy has been consulted for vancomycin dosing.  - also on ceftriaxone 2g IV every 24 hours.   Plan: Day 4-   Vancomycin 1000mg  IV every 12 hours per nomogram.  Scr 1.10  normalized Crcl 78 ml/min  Wt 136.1 kg  Pharmacy will continue to monitor renal function and adjust dose as needed. Will assess for need for Vancomycin levels.  Received lasix IV x 1 dose today    Height: 5\' 11"  (180.3 cm) Weight: 136.1 kg (300 lb) IBW/kg (Calculated) : 75.3  Temp (24hrs), Avg:98.6 F (37 C), Min:98.2 F (36.8 C), Max:98.9 F (37.2 C)  Recent Labs  Lab 05/31/20 1014 05/31/20 1214 06/01/20 0608 06/02/20 0513 06/02/20 1100 06/03/20 0526  WBC 33.3*  --  31.4*  --  31.0* 30.0*  CREATININE 1.49*  --  1.21 1.22 1.03 1.10  LATICACIDVEN 1.7 1.5  --   --   --   --     Estimated Creatinine Clearance: 108.2 mL/min (by C-G formula based on SCr of 1.1 mg/dL).    No Known Allergies  Antimicrobials this admission: 10/25 vancomycin  >>  10/25 ceftriaxone >> 10/26 clindamycin >> 10/27  Microbiology results: 10/25  BCx: NG x 3 days  Thank you for allowing pharmacy to be a part of this patient's care.  11/27, PharmD, BCPS Clinical Pharmacist 06/03/2020 10:22 AM

## 2020-06-03 NOTE — Progress Notes (Signed)
PROGRESS NOTE    FILIPE GREATHOUSE  IDP:[id number] DOB: September 18, 1965 DOA: 05/31/2020 PCP: Patient, No Pcp Per  Brief Narrative:  54 y.o. male with medical history significant of HTN, who presents with left leg pain, swelling, weeping.  Patient states that his left leg pain started approximately 5 days ago, which has been progressively worsening.  Left leg is erythematous, swelling, with significant blistering, open wound and weeping from several popped blisters. The pain is constant, sharp, moderate to severe, nonradiating. 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  Ultrasound on admission negative.  Patient presented with left leg complaints also complains of constipation neck pain.  Started on broad-spectrum IV antibiotics on admission.  Patient's main complaints are constipation and neck pain.  KUB negative for obstruction.  X-ray C-spine negative for fracture.    Assessment & Plan:   Principal Problem:   Left leg cellulitis Active Problems:   Hypertension   Sepsis (HCC)   Hypokalemia   AKI (acute kidney injury) (HCC)   Cellulitis of left leg  Severe Sepsis due to  left leg cellulitis:  Patient meets criteria for sepsis with leukocytosis with WBC 33.3 and tachycardia with heart rate 100 -->95.  with AKI. Lactate is normal, currently hemodynamically stable.  Left lower extremity venous Doppler is negative for DVT Marked leukocytosis Elevated inflammatory markers. --MR showed "Findings of diffuse cellulitis with superficial cyst. Non loculated fluid within the fascial layers. No evidence of osteomyelitis or abscess." --started on vanc/ceftriaxone, with IV clinda added later then d/c'ed again. Plan: --cont ceftriaxone --switch from vanc to Linezolid, per pharm rec  # Severe LLE edema # possible lymphedema --LLE edema worsened with development of a layer of edema with multiple blisters, likely due to IVF given since presentation. 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    Hypertension Blood pressure 126/75, patient not taking medications at home. -IV hydralazine as needed  Hypokalemia: --replete PRN  AKI (acute kidney injury) (HCC) Creatinine 1.49, BUN 25, no baseline creatinine available,  may be due to dehydration versus sepsis. --Cr improved to 1.03 after IVF and tx of cellulitis Plan: --Monitor Cr daily while diuresing.  Cervicalgia Patient is a truck driver Exam significant for rigid lump in posterior neck Noted to soft tissue on x-ray C-spine x-ray negative for fracture Plan: --cont lidocaine patch  Morbid obesity BMI 41.8 This complicates care and prognosis   DVT prophylaxis: Lovenox Code Status: Full Family Communication:  Mother updated at bedside today Status is: Inpatient   Dispo: The patient is from: Home              Anticipated d/c is to: Home              Anticipated d/c date is: undetermined              Patient currently is not medically stable to d/c.  Severe sepsis 2/2 cellulitis, currently on IV abx, not improving yet.   Consultants:   None  Procedures:   None  Antimicrobials:   Vancomycin  Ceftriaxone  Clindamycin   Subjective: Pt reported increased urine output with IV lasix 40 mg.  Left leg pain improved, but  still very swollen. Continued to complain of right ear ringing.   Objective: Vitals:   06/02/20 1553 06/02/20 2355 06/03/20 0809 06/03/20 1611  BP: (!) 151/87 (!) 146/80 138/77 137/87  Pulse: 95 96 85 97  Resp: 18 18 17 17   Temp: 98.8 F (37.1 C) 98.9 F (37.2 C) 98.2 F (36.8 C) 99.3 F (37.4 C)  TempSrc: Oral  Oral Oral Oral  SpO2: 98% 96% 99% 97%  Weight:      Height:        Intake/Output Summary (Last 24 hours) at 06/03/2020 1747 Last data filed at 06/03/2020 1253 Gross per 24 hour  Intake 840 ml  Output 2376 ml  Net -1536 ml   Filed Weights   05/31/20 1004  Weight: 136.1 kg    Examination:  Constitutional: NAD, AAOx3 HEENT: conjunctivae and lids normal, EOMI CV: No cyanosis.   RESP: normal respiratory effort, on RA Extremities: severe swelling in LLE up to thigh, with circumferential layer of blistering with large vesicles on top  SKIN: warm, dry and intact except for left leg Neuro: II - XII grossly intact.   Psych: Normal mood and affect.  Appropriate judgement and reason   05/31/20    06/02/20      Data Reviewed: I have personally reviewed following labs and imaging studies  CBC: Recent Labs  Lab 05/31/20 1014 06/01/20 0608 06/02/20 1100 06/03/20 0526  WBC 33.3* 31.4* 31.0* 30.0*  NEUTROABS 25.8*  --   --   --   HGB 14.6 13.0 13.2 12.3*  HCT 43.2 38.4* 39.3 36.8*  MCV 85.7 87.1 86.9 87.8  PLT 361 334 347 356   Basic Metabolic Panel: Recent Labs  Lab 05/31/20 1014 05/31/20 1326 06/01/20 0608 06/02/20 0513 06/02/20 1100 06/03/20 0526  NA 132*  --  131*  --  134* 136  K 3.4*  --  3.6  --  4.2 4.4  CL 98  --  98  --  99 103  CO2 22  --  23  --  25 24  GLUCOSE 129*  --  169*  --  145* 106*  BUN 25*  --  20  --  15 14  CREATININE 1.49*  --  1.21 1.22 1.03 1.10  CALCIUM 8.3*  --  7.8*  --  8.0* 7.7*  MG  --  2.5*  --   --  2.3 2.3   GFR: Estimated Creatinine Clearance: 108.2 mL/min (by C-G formula based on SCr of 1.1 mg/dL). Liver Function Tests: Recent Labs  Lab 05/31/20 1014  AST 26  ALT 36  ALKPHOS 116  BILITOT 1.5*  PROT 8.1  ALBUMIN 3.0*   No results for input(s): LIPASE, AMYLASE in the last 168 hours. No results for input(s): AMMONIA in the last 168 hours. Coagulation Profile: Recent Labs  Lab 05/31/20 1326  INR 1.3*    Cardiac Enzymes: No results for input(s): CKTOTAL, CKMB, CKMBINDEX, TROPONINI in the last 168 hours. BNP (last 3 results) No results for input(s): PROBNP in the last 8760 hours. HbA1C: No results for input(s): HGBA1C in the last 72 hours. CBG: No results for input(s): GLUCAP in the last 168 hours. Lipid Profile: No results for input(s): CHOL, HDL, LDLCALC, TRIG, CHOLHDL, LDLDIRECT in the last 72 hours. Thyroid Function Tests: No results for input(s): TSH, T4TOTAL, FREET4, T3FREE, THYROIDAB in the last 72 hours. Anemia Panel: No results for input(s): VITAMINB12, FOLATE, FERRITIN, TIBC, IRON, RETICCTPCT in the last 72 hours. Sepsis Labs: Recent Labs  Lab  05/31/20 1014 05/31/20 1214 05/31/20 1326  PROCALCITON  --   --  4.22  LATICACIDVEN 1.7 1.5  --     Recent Results (from the past 240 hour(s))  Blood culture (routine x 2)     Status: None (Preliminary result)   Collection Time: 05/31/20 10:14 AM   Specimen: BLOOD  Result Value Ref Range Status   Specimen Description BLOOD LEFT ASSIST CONTROL  Final   Special Requests   Final    BOTTLES DRAWN AEROBIC AND ANAEROBIC Blood Culture results may not be optimal due to an excessive volume of blood received in culture bottles   Culture   Final    NO GROWTH 3 DAYS Performed at [facility], [address], [place], [place] [address]    Report Status PENDING  Incomplete  Blood culture (routine x 2)     Status: None (Preliminary result)   Collection Time: 05/31/20  1:48 PM   Specimen: BLOOD  Result Value Ref Range Status   Specimen Description BLOOD BLOOD LEFT FOREARM  Final   Special Requests   Final    BOTTLES DRAWN AEROBIC AND ANAEROBIC Blood Culture adequate volume   Culture   Final    NO GROWTH 3 DAYS Performed at [facility], [address], [place], [place] [address]    Report Status PENDING  Incomplete  Respiratory Panel by RT PCR (Flu A&B, Covid) - Nasopharyngeal Swab     Status: None   Collection  Time: 05/31/20  3:02 PM   Specimen: Nasopharyngeal Swab  Result Value Ref Range Status   SARS Coronavirus 2 by RT PCR NEGATIVE NEGATIVE Final    Comment: (NOTE) SARS-CoV-2 target nucleic acids are NOT DETECTED.  The SARS-CoV-2 RNA is generally detectable in upper respiratoy specimens during the acute phase of infection. The lowest concentration of SARS-CoV-2 viral copies this assay can detect is 131 copies/mL. A negative result does not preclude SARS-Cov-2 infection and should not be used as the sole basis for treatment or other patient management decisions. A negative result may occur with  improper specimen collection/handling, submission of specimen other than nasopharyngeal swab, presence of viral mutation(s) within the areas targeted by this assay, and inadequate number of viral copies (<131 copies/mL). A negative result must be combined with clinical observations, patient history, and epidemiological information. The expected result is Negative.  Fact Sheet for Patients:  https://www.moore.com/  Fact Sheet for Healthcare Providers:  https://www.young.biz/  This test is no t yet approved or cleared by the [place] FDA and  has been authorized for detection and/or diagnosis of SARS-CoV-2 by FDA under an Emergency Use Authorization (EUA). This EUA will remain  in effect (meaning this test can be used) for the duration of the COVID-19 declaration under Section 564(b)(1) of the Act, 21 U.S.C. section 360bbb-3(b)(1), unless the authorization is terminated or revoked sooner.     Influenza A by PCR NEGATIVE NEGATIVE Final   Influenza B by PCR NEGATIVE NEGATIVE Final    Comment: (NOTE) The Xpert Xpress SARS-CoV-2/FLU/RSV assay is intended as an aid in  the diagnosis of influenza from Nasopharyngeal swab specimens and  should not be used as a sole basis for treatment. Nasal washings and  aspirates are unacceptable for Xpert Xpress  SARS-CoV-2/FLU/RSV  testing.  Fact Sheet for Patients: https://www.moore.com/  Fact Sheet for Healthcare Providers: https://www.young.biz/  This test is not yet approved or cleared by the [place] FDA and  has been authorized for detection and/or diagnosis of SARS-CoV-2  by  FDA under an Emergency Use Authorization (EUA). This EUA will remain  in effect (meaning this test can be used) for the duration of the  Covid-19 declaration under Section 564(b)(1) of the Act, 21  U.S.C. section 360bbb-3(b)(1), unless the authorization is  terminated or revoked. Performed at [facility], [address] [place], [place] [address]          Radiology Studies: MR TIBIA FIBULA LEFT W WO CONTRAST  Result Date: 06/01/2020 CLINICAL DATA:  Suspect osteomyelitis, diffuse cellulitis EXAM: MRI OF LOWER LEFT EXTREMITY WITHOUT AND WITH CONTRAST TECHNIQUE: Multiplanar, multisequence MR imaging of the left was performed both before and after administration of intravenous contrast. CONTRAST:  47mL GADAVIST GADOBUTROL 1 MMOL/ML IV SOLN COMPARISON:  None. FINDINGS: Bones/Joint/Cartilage No areas of osseous destruction or periosteal reaction. Normal osseous marrow signal seen throughout. There tibial surfaces appear to be maintained. Ligaments Suboptimally visualized Muscles and Tendons The muscles surrounding the lower extremity are intact without focal atrophy or tear. The visualized portions of the tendons are intact. Soft tissues There is diffuse skin thickening with edema and subcutaneous edema extending to the deep fascial layers. There is non loculated fluid within the deep fascial layers. Overlying superficial epidermal cyst/boils are seen. IMPRESSION: Findings of diffuse cellulitis with superficial cyst. Non loculated fluid within the fascial layers. No evidence of osteomyelitis or abscess. Electronically Signed   By: Jonna Clark M.D.   On: 06/01/2020  20:54        Scheduled Meds:  enoxaparin (LOVENOX) injection  0.5 mg/kg Subcutaneous Q24H   hydrOXYzine  50 mg Oral QHS   lidocaine  1 patch Transdermal Q24H   senna-docusate  1 tablet Oral BID   Continuous Infusions:  cefTRIAXone (ROCEPHIN)  IV 2 g (06/03/20 1450)   linezolid (ZYVOX) IV 600 mg (06/03/20 1736)   methocarbamol (ROBAXIN) IV       LOS: 3 days   Darlin Priestly, MD [facility] Pager [phone]  If 7PM-7AM, please contact night-coverage 06/03/2020, 5:47 PM

## 2020-06-03 NOTE — Progress Notes (Signed)
Formatting of this note is different from the original.  Pharmacy Antibiotic Note    Thomas Browning is a 54 y.o. male admitted on 05/31/2020 with left leg cellulitis. Patient reported LLE pain started about 5 days go. According to admission note leg is swallow with significant blistering.  Pharmacy has been consulted for vancomycin dosing.   - also on ceftriaxone 2g IV every 24 hours.     Plan:  Day 4-   Vancomycin 1000mg  IV every 12 hours per nomogram.   Scr 1.10  normalized Crcl 78 ml/min  Wt 136.1 kg    Pharmacy will continue to monitor renal function and adjust dose as needed. Will assess for need for Vancomycin levels.   Received lasix IV x 1 dose today    Height: 5\' 11"  (180.3 cm)  Weight: 136.1 kg (300 lb)  IBW/kg (Calculated) : 75.3    Temp (24hrs), Avg:98.6 F (37 C), Min:98.2 F (36.8 C), Max:98.9 F (37.2 C)    Recent Labs   Lab 05/31/20  1014 05/31/20  1214 06/01/20  0608 06/02/20  0513 06/02/20  1100 06/03/20  0526   WBC 33.3*  --  31.4*  --  31.0* 30.0*   CREATININE 1.49*  --  1.21 1.22 1.03 1.10   LATICACIDVEN 1.7 1.5  --   --   --   --      Estimated Creatinine Clearance: 108.2 mL/min (by C-G formula based on SCr of 1.1 mg/dL).      No Known Allergies    Antimicrobials this admission:  10/25 vancomycin  >>   10/25 ceftriaxone >>  10/26 clindamycin >> 10/27    Microbiology results:  10/25  BCx: NG x 3 days    Thank you for allowing pharmacy to be a part of this patient?s care.    Angelique Blonder, PharmD, BCPS  Clinical Pharmacist  06/03/2020  10:22 AM      Electronically signed by Angelique Blonder, RPH at 06/03/2020 10:26 AM EDT

## 2020-06-03 NOTE — Progress Notes (Signed)
Formatting of this note might be different from the original.  PT Cancellation Note    Patient Details  Name: Thomas Browning  MRN: [id number]  DOB: 1966/07/29    Cancelled Treatment:    Reason Eval/Treat Not Completed: Pain limiting ability to participate.  Nurse preparing to give pt pain medication upon PT arrival.  Pt reporting 6/10 L LE pain and declining therapy d/t L LE pain and wanting pain medication to take effect first.  Unfortunately therapist unable to return this afternoon.  Pt reports he has been getting up and walking to bathroom on his own.  Will re-attempt PT treatment session at a later date/time.    Hendricks Limes, PT  06/03/20, 4:18 PM      Electronically signed by Hendricks Limes, PT at 06/03/2020  4:22 PM EDT

## 2020-06-03 NOTE — Progress Notes (Signed)
Formatting of this note is different from the original.  Occupational Therapy Treatment  Patient Details  Name: Thomas Browning  MRN: [id number]  DOB: 09-10-1965  Today's Date: 06/03/2020    History of present illness Pt is a 54 y.o. male presenting to hospital 10/25 with L leg redness, swelling, pain, and weapage.  Pt admitted with sepsis d/t L leg cellulitis, htn, hypokalemia, AKI, and cervicalgia (rigid lump posterior neck).  PMH includes htn.    OT comments   Pt seen for OT tx this date. Pt agreeable, endorses doing much better. Reports 4-5/10 LLE pain and recently took pain medication to address it. Pt instructed in AE/DME and home/routines modifications to max safety and comfort for ADL. Pt verbalized understanding. Pt continues to benefit from skilled OT services while hospitalized.     Follow Up Recommendations   No OT follow up     Equipment Recommendations   3 in 1 bedside commode     Recommendations for Other Services        Precautions / Restrictions Precautions  Precautions: Fall  Precaution Comments: LLE wound  Restrictions  Weight Bearing Restrictions: No         Mobility Bed Mobility                    Transfers                        Balance                                            ADL either performed or assessed with clinical judgement     ADL                                                Vision        Perception      Praxis        Cognition Arousal/Alertness: Awake/alert  Behavior During Therapy: WFL for tasks assessed/performed  Overall Cognitive Status: Within Functional Limits for tasks assessed                                          Exercises Other Exercises  Other Exercises: Pt instructed in AE/DME and home/routines modifications to max safety and comfort for ADL    Shoulder Instructions        General Comments       Pertinent Vitals/ Pain       Pain Assessment: 0-10  Pain Score: 5   Pain Location: LLE  Pain Descriptors / Indicators: Aching;Throbbing;Sharp  Pain Intervention(s): Limited  activity within patient's tolerance;Monitored during session;Premedicated before session    Home Living                                            Prior Functioning/Environment               Frequency   Min 1X/week       Progress Toward Goals  OT Goals(current goals can now be found in the care plan section)   Progress towards OT goals: Progressing toward goals    Acute Rehab OT Goals  Patient Stated Goal: to improve L LE pain and mobility  OT Goal Formulation: With patient  Time For Goal Achievement: 06/15/20  Potential to Achieve Goals: Good   Plan Discharge plan remains appropriate;Frequency remains appropriate      Co-evaluation                  AM-PAC OT "6 Clicks" Daily Activity      Outcome Measure     Help from another person eating meals?: None  Help from another person taking care of personal grooming?: None  Help from another person toileting, which includes using toliet, bedpan, or urinal?: None  Help from another person bathing (including washing, rinsing, drying)?: A Little  Help from another person to put on and taking off regular upper body clothing?: None  Help from another person to put on and taking off regular lower body clothing?: A Little  6 Click Score: 22      End of Session      OT Visit Diagnosis: Other abnormalities of gait and mobility (R26.89);Pain  Pain - Right/Left: Left  Pain - part of body: Leg    Activity Tolerance Patient tolerated treatment well    Patient Left in bed;with call bell/phone within reach    Nurse Communication            Time: 1191-4782  OT Time Calculation (min): 10 min    Charges: OT General Charges  $OT Visit: 1 Visit  OT Treatments  $Self Care/Home Management : 8-22 mins    Richrd Prime, MPH, MS, OTR/L  ascom [phone]  06/03/20, 1:48 PM      Electronically signed by Eliezer Bottom, OT at 06/03/2020  1:48 PM EDT

## 2020-06-03 NOTE — Progress Notes (Signed)
Formatting of this note is different from the original.  Images from the original note were not included.    PROGRESS NOTE    Thomas Browning  ZOX:[id number] DOB: Jan 09, 1966 DOA: 05/31/2020  PCP: Patient, No Pcp Per   Brief Narrative:   54 y.o. male with medical history significant of HTN, who presents with left leg pain, swelling, weeping.    Patient states that his left leg pain started approximately 5 days ago, which has been progressively worsening.  Left leg is erythematous, swelling, with significant blistering, open wound and weeping from several popped blisters. The pain is constant, sharp, moderate to severe, nonradiating. 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  Ultrasound on admission negative.    Patient presented with left leg complaints also complains of constipation neck pain.  Started on broad-spectrum IV antibiotics on admission.    Patient's main complaints are constipation and neck pain.  KUB negative for obstruction.  X-ray C-spine negative for fracture.    Assessment & Plan:    Principal Problem:    Left leg cellulitis  Active Problems:    Hypertension    Sepsis (HCC)    Hypokalemia    AKI (acute kidney injury) (HCC)    Cellulitis of left leg    Severe Sepsis due to   left leg cellulitis:   Patient meets criteria for sepsis with leukocytosis with WBC 33.3 and tachycardia with heart rate 100 -->95.  with AKI.  Lactate is normal, currently hemodynamically stable.   Left lower extremity venous Doppler is negative for DVT  Marked leukocytosis  Elevated inflammatory markers.  --MR showed "Findings of diffuse cellulitis with superficial cyst. Non loculated fluid within the fascial layers. No evidence of osteomyelitis or abscess."  --started on vanc/ceftriaxone, with IV clinda added later then d/c'ed again.  Plan:  --cont ceftriaxone  --switch from vanc to Linezolid, per pharm rec    # Severe LLE edema  # possible lymphedema  --LLE edema worsened with development of a layer of  edema with multiple blisters, likely due to IVF given since presentation.  --MIVF d/c'ed on 10/27  PLAN:  --IV lasix 40 mg x2 today  --Strict I/O    Constipation  Patient states he is chronically constipated and takes multiple laxatives with minimal relief  Likely owing to dietary indiscretions considering his occupation as a [employer]  No obstruction or ileus noted on imaging  Moderate stool burden  Plan:  --miralax 34g q2h x5 doses or until BM achieved.      Hypertension  Blood pressure 126/75, patient not taking medications at home.  -IV hydralazine as needed    Hypokalemia:  --replete PRN    AKI (acute kidney injury) (HCC)  Creatinine 1.49, BUN 25, no baseline creatinine available,   may be due to dehydration versus sepsis.  --Cr improved to 1.03 after IVF and tx of cellulitis  Plan:  --Monitor Cr daily while diuresing.    Cervicalgia  Patient is a truck driver  Exam significant for rigid lump in posterior neck  Noted to soft tissue on x-ray  C-spine x-ray negative for fracture  Plan:  --cont lidocaine patch    Morbid obesity  BMI 41.8  This complicates care and prognosis    DVT prophylaxis: Lovenox  Code Status: Full  Family Communication:  Mother updated at bedside today  Status is: Inpatient    Dispo: The patient is from: Home  Anticipated d/c is to: Home               Anticipated d/c date is: undetermined               Patient currently is not medically stable to d/c.  Severe sepsis 2/2 cellulitis, currently on IV abx, not improving yet.    Consultants:    None    Procedures:    None    Antimicrobials:    Vancomycin   Ceftriaxone   Clindamycin    Subjective:  Pt reported increased urine output with IV lasix 40 mg.  Left leg pain improved, but still very swollen.  Continued to complain of right ear ringing.    Objective:  Vitals:    06/02/20 1553 06/02/20 2355 06/03/20 0809 06/03/20 1611   BP: (!) 151/87 (!) 146/80 138/77 137/87   Pulse: 95 96 85 97   Resp: 18 18 17 17    Temp: 98.8 F  (37.1 C) 98.9 F (37.2 C) 98.2 F (36.8 C) 99.3 F (37.4 C)   TempSrc: Oral Oral Oral Oral   SpO2: 98% 96% 99% 97%   Weight:       Height:         Intake/Output Summary (Last 24 hours) at 06/03/2020 1747  Last data filed at 06/03/2020 1253  Gross per 24 hour   Intake 840 ml   Output 2376 ml   Net -1536 ml     Filed Weights    05/31/20 1004   Weight: 136.1 kg     Examination:    Constitutional: NAD, AAOx3  HEENT: conjunctivae and lids normal, EOMI  CV: No cyanosis.    RESP: normal respiratory effort, on RA  Extremities: severe swelling in LLE up to thigh, with circumferential layer of blistering with large vesicles on top   SKIN: warm, dry and intact except for left leg  Neuro: II - XII grossly intact.    Psych: Normal mood and affect.  Appropriate judgement and reason    05/31/20    06/02/20    Data Reviewed: I have personally reviewed following labs and imaging studies    CBC:  Recent Labs   Lab 05/31/20  1014 06/01/20  0608 06/02/20  1100 06/03/20  0526   WBC 33.3* 31.4* 31.0* 30.0*   NEUTROABS 25.8*  --   --   --    HGB 14.6 13.0 13.2 12.3*   HCT 43.2 38.4* 39.3 36.8*   MCV 85.7 87.1 86.9 87.8   PLT 361 334 347 356     Basic Metabolic Panel:  Recent Labs   Lab 05/31/20  1014 05/31/20  1326 06/01/20  0608 06/02/20  0513 06/02/20  1100 06/03/20  0526   NA 132*  --  131*  --  134* 136   K 3.4*  --  3.6  --  4.2 4.4   CL 98  --  98  --  99 103   CO2 22  --  23  --  25 24   GLUCOSE 129*  --  169*  --  145* 106*   BUN 25*  --  20  --  15 14   CREATININE 1.49*  --  1.21 1.22 1.03 1.10   CALCIUM 8.3*  --  7.8*  --  8.0* 7.7*   MG  --  2.5*  --   --  2.3 2.3     GFR:  Estimated Creatinine Clearance: 108.2 mL/min (by C-G formula based on  SCr of 1.1 mg/dL).  Liver Function Tests:  Recent Labs   Lab 05/31/20  1014   AST 26   ALT 36   ALKPHOS 116   BILITOT 1.5*   PROT 8.1   ALBUMIN 3.0*     No results for input(s): LIPASE, AMYLASE in the last 168 hours.  No results for input(s): AMMONIA in the last 168 hours.  Coagulation  Profile:  Recent Labs   Lab 05/31/20  1326   INR 1.3*     Cardiac Enzymes:  No results for input(s): CKTOTAL, CKMB, CKMBINDEX, TROPONINI in the last 168 hours.  BNP (last 3 results)  No results for input(s): PROBNP in the last 8760 hours.  HbA1C:  No results for input(s): HGBA1C in the last 72 hours.  CBG:  No results for input(s): GLUCAP in the last 168 hours.  Lipid Profile:  No results for input(s): CHOL, HDL, LDLCALC, TRIG, CHOLHDL, LDLDIRECT in the last 72 hours.  Thyroid Function Tests:  No results for input(s): TSH, T4TOTAL, FREET4, T3FREE, THYROIDAB in the last 72 hours.  Anemia Panel:  No results for input(s): VITAMINB12, FOLATE, FERRITIN, TIBC, IRON, RETICCTPCT in the last 72 hours.  Sepsis Labs:  Recent Labs   Lab 05/31/20  1014 05/31/20  1214 05/31/20  1326   PROCALCITON  --   --  4.22   LATICACIDVEN 1.7 1.5  --      Recent Results (from the past 240 hour(s))   Blood culture (routine x 2)     Status: None (Preliminary result)    Collection Time: 05/31/20 10:14 AM    Specimen: BLOOD   Result Value Ref Range Status    Specimen Description BLOOD LEFT ASSIST CONTROL  Final    Special Requests   Final     BOTTLES DRAWN AEROBIC AND ANAEROBIC Blood Culture results may not be optimal due to an excessive volume of blood received in culture bottles    Culture   Final     NO GROWTH 3 DAYS  Performed at [facility], [address] [place], [place] [address]     Report Status PENDING  Incomplete   Blood culture (routine x 2)     Status: None (Preliminary result)    Collection Time: 05/31/20  1:48 PM    Specimen: BLOOD   Result Value Ref Range Status    Specimen Description BLOOD BLOOD LEFT FOREARM  Final    Special Requests   Final     BOTTLES DRAWN AEROBIC AND ANAEROBIC Blood Culture adequate volume    Culture   Final     NO GROWTH 3 DAYS  Performed at [facility], [address] [place], [place] [address]     Report Status PENDING  Incomplete   Respiratory Panel by RT PCR (Flu A&B, Covid) -  Nasopharyngeal Swab     Status: None    Collection Time: 05/31/20  3:02 PM    Specimen: Nasopharyngeal Swab   Result Value Ref Range Status    SARS Coronavirus 2 by RT PCR NEGATIVE NEGATIVE Final     Comment: (NOTE)  SARS-CoV-2 target nucleic acids are NOT DETECTED.    The SARS-CoV-2 RNA is generally detectable in upper respiratoy  specimens during the acute phase of infection. The lowest  concentration of SARS-CoV-2 viral copies this assay can detect is  131 copies/mL. A negative result does not preclude SARS-Cov-2  infection and should not be used as the sole basis for treatment or  other  patient management decisions. A negative result may occur with   improper specimen collection/handling, submission of specimen other  than nasopharyngeal swab, presence of viral mutation(s) within the  areas targeted by this assay, and inadequate number of viral copies  (<131 copies/mL). A negative result must be combined with clinical  observations, patient history, and epidemiological information. The  expected result is Negative.    Fact Sheet for Patients:   https://www.moore.com/    Fact Sheet for Healthcare Providers:   https://www.young.biz/    This test is no t yet approved or cleared by the [place] FDA and   has been authorized for detection and/or diagnosis of SARS-CoV-2 by  FDA under an Emergency Use Authorization (EUA). This EUA will remain   in effect (meaning this test can be used) for the duration of the  COVID-19 declaration under Section 564(b)(1) of the Act, 21 U.S.C.  section 360bbb-3(b)(1), unless the authorization is terminated or  revoked sooner.       Influenza A by PCR NEGATIVE NEGATIVE Final    Influenza B by PCR NEGATIVE NEGATIVE Final     Comment: (NOTE)  The Xpert Xpress SARS-CoV-2/FLU/RSV assay is intended as an aid in   the diagnosis of influenza from Nasopharyngeal swab specimens and   should not be used as a sole basis for treatment. Nasal washings and    aspirates are unacceptable for Xpert Xpress SARS-CoV-2/FLU/RSV   testing.    Fact Sheet for Patients:  https://www.moore.com/    Fact Sheet for Healthcare Providers:  https://www.young.biz/    This test is not yet approved or cleared by the [place] FDA and   has been authorized for detection and/or diagnosis of SARS-CoV-2 by   FDA under an Emergency Use Authorization (EUA). This EUA will remain   in effect (meaning this test can be used) for the duration of the   Covid-19 declaration under Section 564(b)(1) of the Act, 21   U.S.C. section 360bbb-3(b)(1), unless the authorization is   terminated or revoked.  Performed at [facility], [address] [place],  [place] [address]        Radiology Studies:  MR TIBIA FIBULA LEFT W WO CONTRAST    Result Date: 06/01/2020  CLINICAL DATA:  Suspect osteomyelitis, diffuse cellulitis EXAM: MRI OF LOWER LEFT EXTREMITY WITHOUT AND WITH CONTRAST TECHNIQUE: Multiplanar, multisequence MR imaging of the left was performed both before and after administration of intravenous contrast. CONTRAST:  10mL GADAVIST GADOBUTROL 1 MMOL/ML IV SOLN COMPARISON:  None. FINDINGS: Bones/Joint/Cartilage No areas of osseous destruction or periosteal reaction. Normal osseous marrow signal seen throughout. There tibial surfaces appear to be maintained. Ligaments Suboptimally visualized Muscles and Tendons The muscles surrounding the lower extremity are intact without focal atrophy or tear. The visualized portions of the tendons are intact. Soft tissues There is diffuse skin thickening with edema and subcutaneous edema extending to the deep fascial layers. There is non loculated fluid within the deep fascial layers. Overlying superficial epidermal cyst/boils are seen. IMPRESSION: Findings of diffuse cellulitis with superficial cyst. Non loculated fluid within the fascial layers. No evidence of osteomyelitis or abscess. Electronically Signed   By:  Jonna Clark M.D.   On: 06/01/2020 20:54     Scheduled Meds:  ? enoxaparin (LOVENOX) injection  0.5 mg/kg Subcutaneous Q24H   ? hydrOXYzine  50 mg Oral QHS   ? lidocaine  1 patch Transdermal Q24H   ? senna-docusate  1 tablet Oral BID     Continuous Infusions:  ?  cefTRIAXone (ROCEPHIN)  IV 2 g (06/03/20 1450)   ? linezolid (ZYVOX) IV 600 mg (06/03/20 1736)   ? methocarbamol (ROBAXIN) IV        LOS: 3 days     Darlin Priestly, MD  [facility]  Pager [phone]    If 7PM-7AM, please contact night-coverage  06/03/2020, 5:47 PM   Electronically signed by Darlin Priestly, MD at 06/03/2020  6:42 PM EDT

## 2020-06-04 ENCOUNTER — Inpatient Hospital Stay: Payer: Self-pay

## 2020-06-04 DIAGNOSIS — L03116 Cellulitis of left lower limb: Secondary | ICD-10-CM

## 2020-06-04 DIAGNOSIS — R609 Edema, unspecified: Secondary | ICD-10-CM

## 2020-06-04 DIAGNOSIS — I10 Essential (primary) hypertension: Secondary | ICD-10-CM

## 2020-06-04 LAB — CBC
HCT: 40.7 % (ref 39.0–52.0)
Hemoglobin: 13.8 g/dL (ref 13.0–17.0)
MCH: 29.7 pg (ref 26.0–34.0)
MCHC: 33.9 g/dL (ref 30.0–36.0)
MCV: 87.5 fL (ref 80.0–100.0)
Platelets: 424 10*3/uL — ABNORMAL HIGH (ref 150–400)
RBC: 4.65 MIL/uL (ref 4.22–5.81)
RDW: 15.2 % (ref 11.5–15.5)
WBC: 27.6 10*3/uL — ABNORMAL HIGH (ref 4.0–10.5)
nRBC: 0 % (ref 0.0–0.2)

## 2020-06-04 LAB — BASIC METABOLIC PANEL
Anion gap: 12 (ref 5–15)
BUN: 15 mg/dL (ref 6–20)
CO2: 25 mmol/L (ref 22–32)
Calcium: 7.9 mg/dL — ABNORMAL LOW (ref 8.9–10.3)
Chloride: 95 mmol/L — ABNORMAL LOW (ref 98–111)
Creatinine, Ser: 1.3 mg/dL — ABNORMAL HIGH (ref 0.61–1.24)
GFR, Estimated: >60 mL/min (ref >60)
Glucose, Bld: 113 mg/dL — ABNORMAL HIGH (ref 70–99)
Potassium: 3.9 mmol/L (ref 3.5–5.1)
Sodium: 132 mmol/L — ABNORMAL LOW (ref 135–145)

## 2020-06-04 LAB — MAGNESIUM: Magnesium: 2.3 mg/dL (ref 1.7–2.4)

## 2020-06-04 IMAGING — MR MR [PERSON_NAME] LOW WO/W CM*L*
9 series · 40 of 40 positions shown · IV contrast (gadavist)
Comparison: [DATE]

CLINICAL DATA: Left lower extremity pain and swelling.

EXAM:
MRI OF LOWER LEFT EXTREMITY WITHOUT AND WITH CONTRAST
TECHNIQUE: Multiplanar, multisequence MR imaging of the left lower extremity
was performed both before and after administration of intravenous
contrast.
CONTRAST:  10mL GADAVIST GADOBUTROL 1 MMOL/ML IV SOLN

[Series 7: composed cor t1_comp_filt · coronal · left · 5.0mm · 1.22mm/px · 3 of 35 slices shown]
[im 1/35]
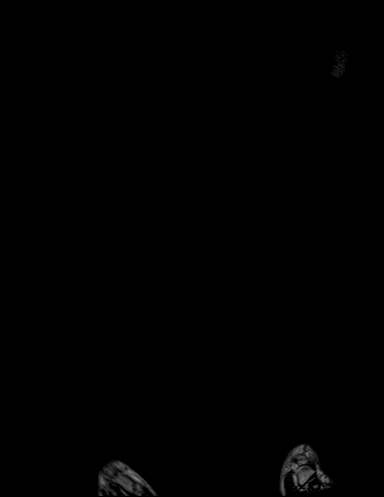
[im 18/35]
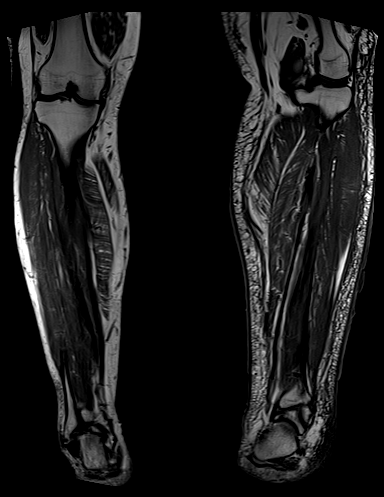
[im 35/35]
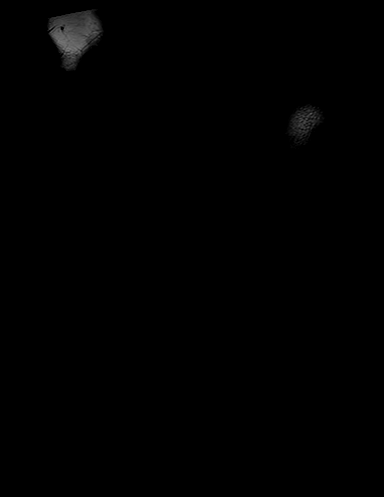

[Series 11: composed cor stir_comp_filt · coronal · left · 5.0mm · 1.22mm/px · 3 of 36 slices shown]
[im 1/36]
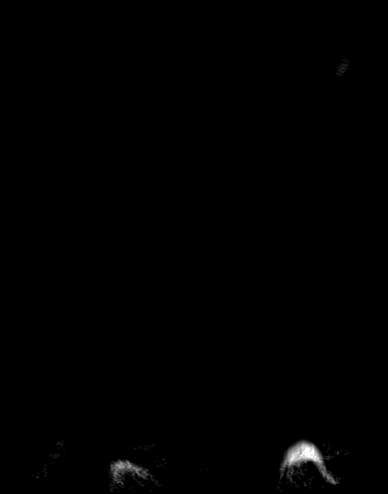
[im 18/36]
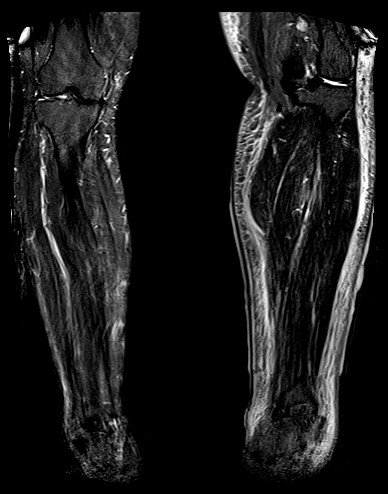
[im 36/36]
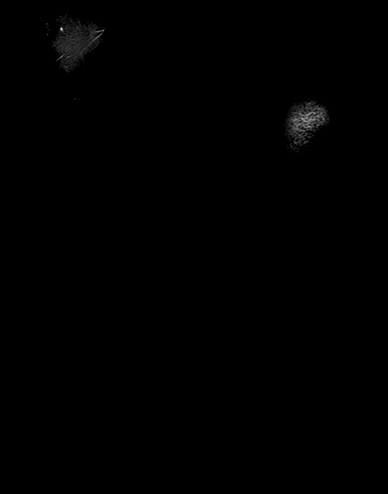

[Series 14: ax t1_comp_filt · axial · left · 4.0mm · 0.78mm/px · z∈[-281,+168]mm · 7 of 92 slices shown]
[im 1/92]
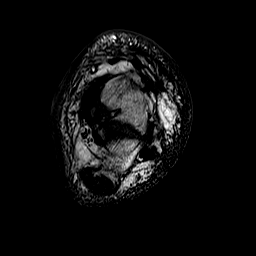
[im 16/92]
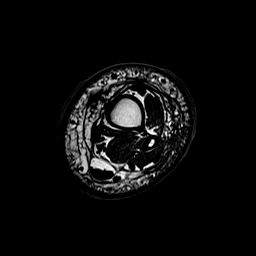
[im 31/92]
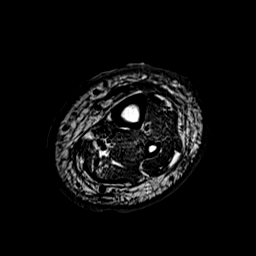
[im 46/92]
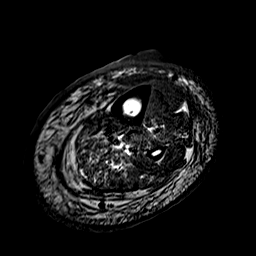
[im 61/92]
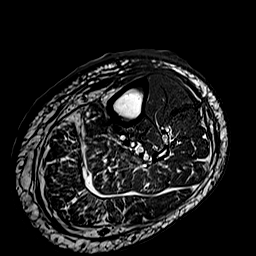
[im 76/92]
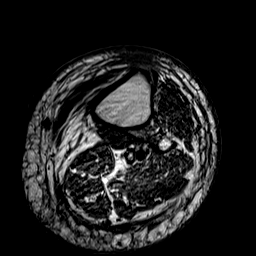
[im 92/92]
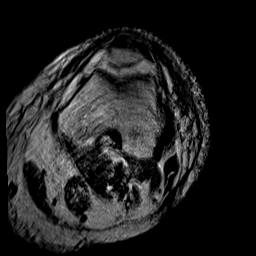

[Series 17: T2 · axial · left · 4.0mm · 0.74mm/px · z∈[-281,+168]mm · 7 of 92 slices shown]
[im 1/92]
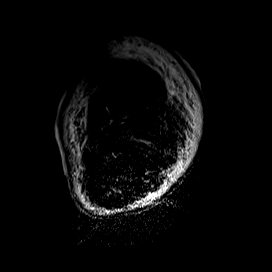
[im 16/92]
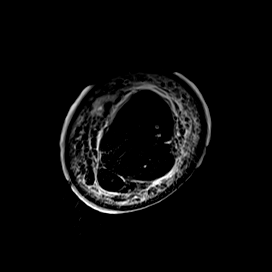
[im 31/92]
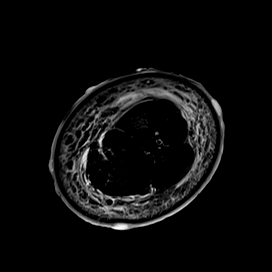
[im 46/92]
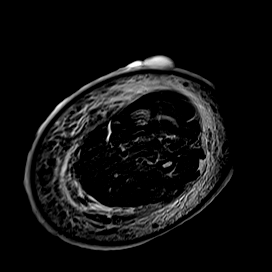
[im 61/92]
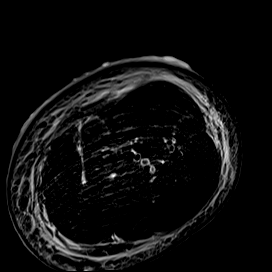
[im 76/92]
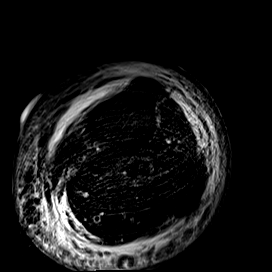
[im 92/92]
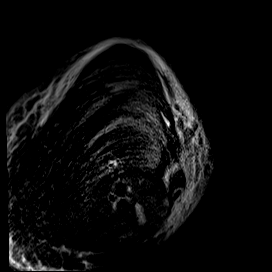

[Series 23: T1 fat-sat · axial · left · 4.0mm · 0.78mm/px · z∈[-281,+168]mm · 7 of 92 slices shown (1 of 2)]
[im 1/92]
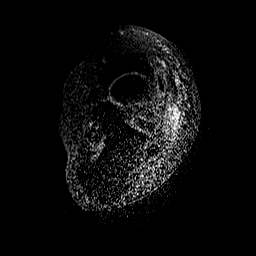
[im 16/92]
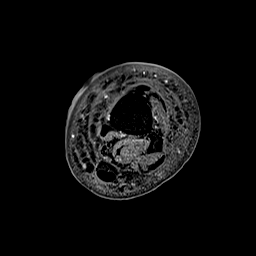
[im 31/92]
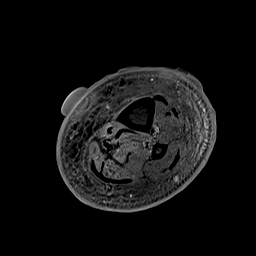
[im 46/92]
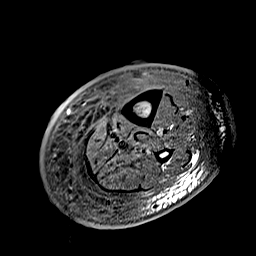
[im 61/92]
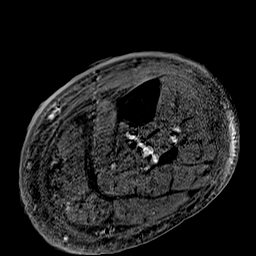
[im 76/92]
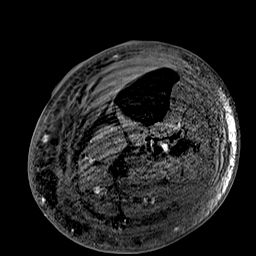
[im 92/92]
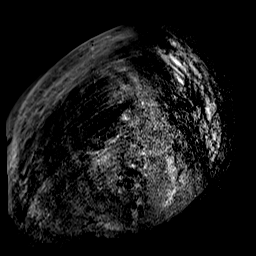

[Series 26: t2_tse_fs_sag fs_comp · sagittal · left · 5.6mm · 0.88mm/px · 2 of 32 slices shown]
[im 1/32]
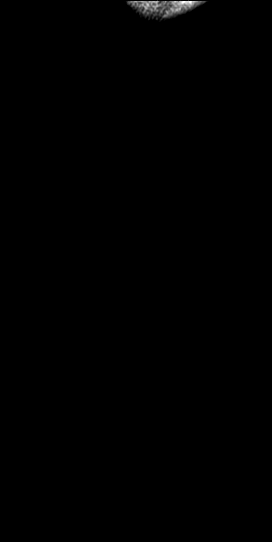
[im 32/32]
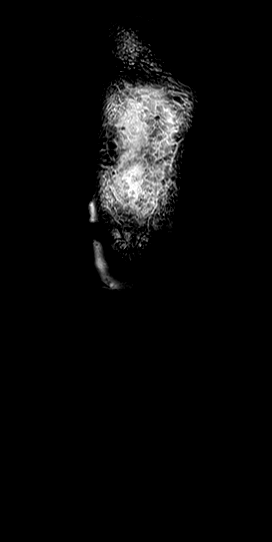

[Series 29: T1 fat-sat · axial · left · 4.0mm · 0.78mm/px · z∈[-281,+168]mm · 7 of 92 slices shown (2 of 2)]
[im 1/92]
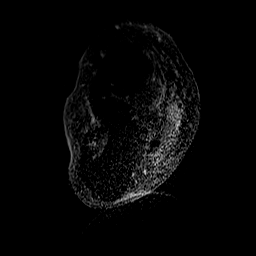
[im 16/92]
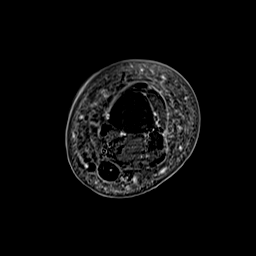
[im 31/92]
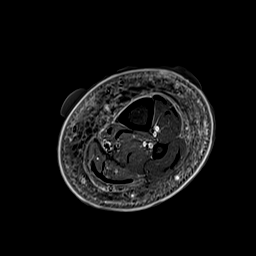
[im 46/92]
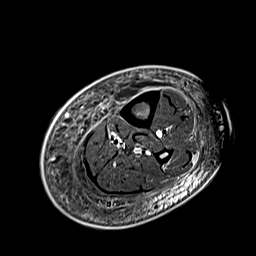
[im 61/92]
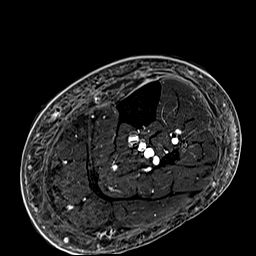
[im 76/92]
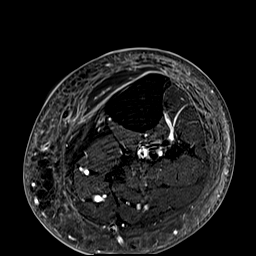
[im 92/92]
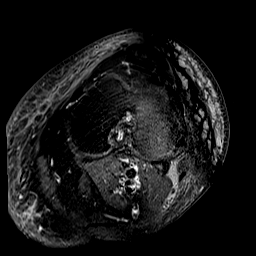

[Series 30: T1 · sagittal · left · 4.5mm · 0.44mm/px · 2 of 32 slices shown (1 of 2)]
[im 1/32]
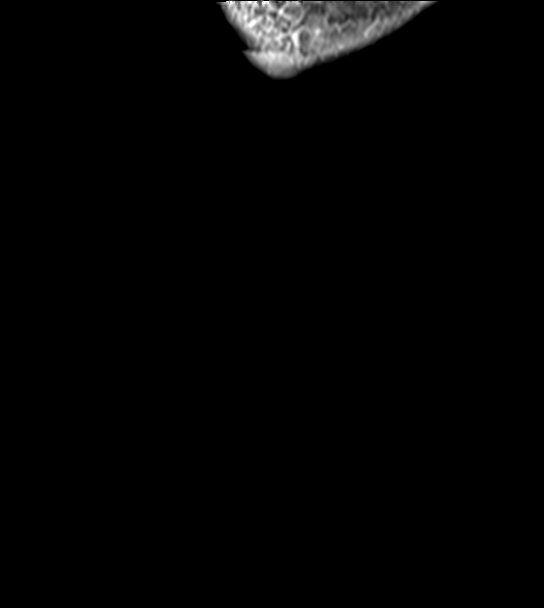
[im 32/32]
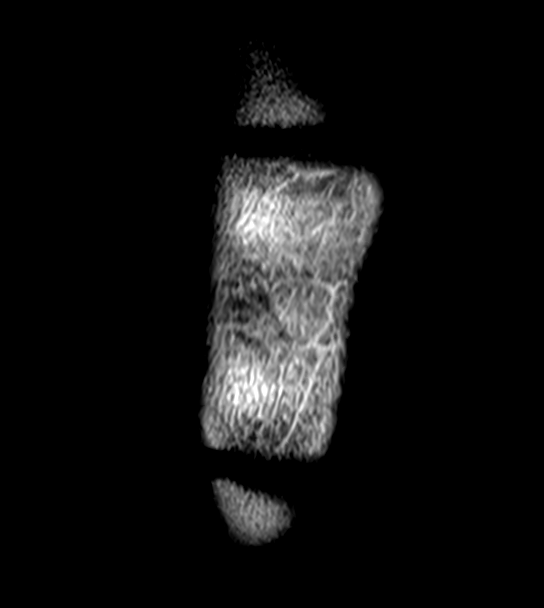

[Series 31: T1 · sagittal · left · 4.5mm · 0.44mm/px · 2 of 32 slices shown (2 of 2)]
[im 1/32]
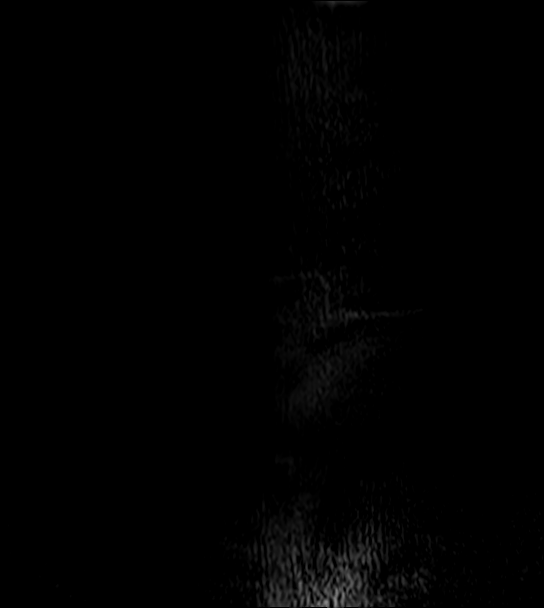
[im 32/32]
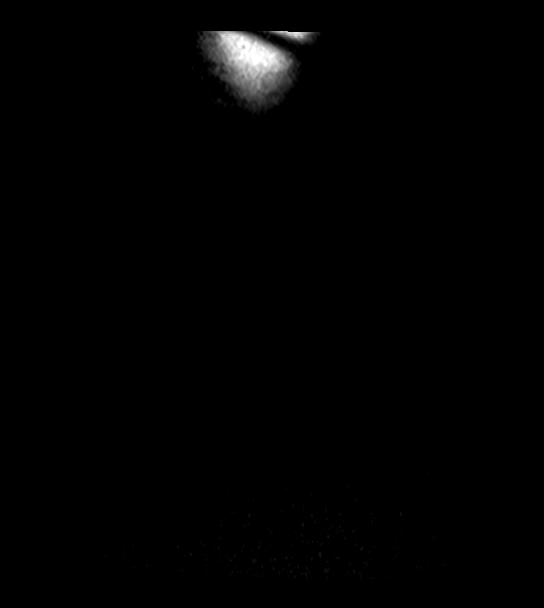

[40 of 40 positions shown; findings below may reference images not displayed]

FINDINGS: Examination is limited by patient motion.

Diffuse and marked subcutaneous soft tissue swelling/edema/fluid
similar to prior study. There are numerous skin lesions, likely
blisters.

In the upper and mid mid tibia regions anteriorly and medially there
are subcutaneous fluid collections which are ill-defined and not
well organized. These could be a developing abscesses. No
well-formed rim enhancing drainable abscess is identified.

No MR findings for myofasciitis or pyomyositis. No evidence of
osteomyelitis. No findings suspicious for septic arthritis.
IMPRESSION: 1. Persistent diffuse and marked subcutaneous soft tissue
swelling/edema/fluid similar to prior study. There are numerous skin
lesions, likely blisters.
2. In the upper and mid tibia regions anteriorly and medially there
are ill-defined subcutaneous fluid collections which are not well
organized. These could be a developing abscesses. No well-formed rim
enhancing drainable abscess is identified.
3. No MR findings for myofasciitis or pyomyositis.
4. No findings suspicious for septic arthritis or osteomyelitis.

## 2020-06-04 MED ORDER — METHOCARBAMOL 500 MG PO TABS
750.0000 mg | ORAL_TABLET | Freq: Four times a day (QID) | ORAL | Status: DC | PRN
  Administered 2020-06-04 – 2020-06-13 (×2): 750 mg via ORAL
  Filled 2020-06-04 (×2): qty 2

## 2020-06-04 MED ORDER — GADOBUTROL 1 MMOL/ML IV SOLN
10.0000 mL | Freq: Once | INTRAVENOUS | Status: AC | PRN
  Administered 2020-06-05: 10 mL via INTRAVENOUS

## 2020-06-04 MED ORDER — HYDROXYZINE HCL 50 MG PO TABS
100.0000 mg | ORAL_TABLET | Freq: Every day | ORAL | Status: DC
  Administered 2020-06-04 – 2020-06-14 (×11): 100 mg via ORAL
  Filled 2020-06-04 (×12): qty 2

## 2020-06-04 MED ORDER — LORAZEPAM 2 MG/ML IJ SOLN
1.0000 mg | Freq: Once | INTRAMUSCULAR | Status: AC | PRN
  Administered 2020-06-04: 1 mg via INTRAVENOUS
  Filled 2020-06-04: qty 1

## 2020-06-04 MED ORDER — FUROSEMIDE 10 MG/ML IJ SOLN
40.0000 mg | Freq: Two times a day (BID) | INTRAMUSCULAR | Status: DC
  Administered 2020-06-04 – 2020-06-06 (×5): 40 mg via INTRAVENOUS
  Filled 2020-06-04 (×6): qty 4

## 2020-06-04 NOTE — Progress Notes (Signed)
Physical Therapy Treatment Patient Details Name: Stephen [name] MRN: [id number] DOB: [birth year] Today's Date: 06/04/2020    History of Present Illness Pt is a 54 y.o. male presenting to hospital 10/25 with L leg redness, swelling, pain, and weapage.  Pt admitted with sepsis d/t L leg cellulitis, htn, hypokalemia, AKI, and cervicalgia (rigid lump posterior neck).  PMH includes htn.    PT Comments    Pt resting in bed upon PT arrival; pre-medicated with pain medication.  Able to progress to ambulating 200 feet with RW CGA to SBA; antalgic gait d/t L LE pain although pt reporting improved comfort compared to yesterday.  3-4/10 L LE pain at rest end of session (increased to 6-7/10 with ambulation).  Will continue to progress pt with ambulation and trial stairs as appropriate.    Follow Up Recommendations  Home health PT     Equipment Recommendations  Rolling walker with 5" wheels;3in1 (PT)    Recommendations for Other Services       Precautions / Restrictions Precautions Precautions: Fall Precaution Comments: LLE wound Restrictions Weight Bearing Restrictions: No    Mobility  Bed Mobility Overal bed mobility: Needs Assistance Bed Mobility: Supine to Sit;Sit to Supine     Supine to sit: Modified independent (Device/Increase time);HOB elevated Sit to supine: Modified independent (Device/Increase time);HOB elevated   General bed mobility comments: mild increased effort to move L LE d/t pain and swelling  Transfers Overall transfer level: Needs assistance Equipment used: Rolling walker (2 wheeled) Transfers: Sit to/from Stand Sit to Stand: Supervision         General transfer comment: mild increased effort to stand with UE support and weight more through R LE (d/t L LE pain)  Ambulation/Gait Ambulation/Gait assistance: Min guard;Supervision Gait Distance (Feet): 200 Feet Assistive device: Rolling walker (2 wheeled)   Gait velocity: mildly decreased   General Gait  Details: antalgic; decreased stance time L LE; steady with RW   Stairs Stairs:  (pt declined (pt reporting not wanting to overdo it d/t L LE pain))           Wheelchair Mobility    Modified Rankin (Stroke Patients Only)       Balance Overall balance assessment: Needs assistance Sitting-balance support: No upper extremity supported;Feet supported Sitting balance-Leahy Scale: Normal Sitting balance - Comments: steady sitting reaching outside BOS   Standing balance support: Bilateral upper extremity supported;During functional activity Standing balance-Leahy Scale: Good Standing balance comment: steady ambulating with RW                            Cognition Arousal/Alertness: Awake/alert Behavior During Therapy: WFL for tasks assessed/performed Overall Cognitive Status: Within Functional Limits for tasks assessed                                        Exercises      General Comments General comments (skin integrity, edema, etc.): L LE red and edematous; chucks underneath to collect fluid.  Pt agreeable to PT session.      Pertinent Vitals/Pain Pain Assessment: 0-10 Pain Score: 3  Pain Location: LLE Pain Descriptors / Indicators: Aching Pain Intervention(s): Limited activity within patient's tolerance;Monitored during session;Premedicated before session;Repositioned  Vitals (HR and O2 on room air) stable and WFL throughout treatment session.    Home Living  Prior Function            PT Goals (current goals can now be found in the care plan section) Acute Rehab PT Goals Patient Stated Goal: to improve L LE pain and mobility PT Goal Formulation: With patient Time For Goal Achievement: 06/16/20 Potential to Achieve Goals: Good Progress towards PT goals: Progressing toward goals    Frequency    Min 2X/week      PT Plan Current plan remains appropriate    Co-evaluation               AM-PAC PT "6 Clicks" Mobility   Outcome Measure  Help needed turning from your back to your side while in a flat bed without using bedrails?: None Help needed moving from lying on your back to sitting on the side of a flat bed without using bedrails?: None Help needed moving to and from a bed to a chair (including a wheelchair)?: A Little Help needed standing up from a chair using your arms (e.g., wheelchair or bedside chair)?: A Little Help needed to walk in hospital room?: A Little Help needed climbing 3-5 steps with a railing? : A Little 6 Click Score: 20    End of Session Equipment Utilized During Treatment: Gait belt Activity Tolerance: Patient limited by pain Patient left: in bed;with call bell/phone within reach;Other (comment) (L LE elevated on pillow) Nurse Communication: Mobility status;Precautions (via white board) PT Visit Diagnosis: Other abnormalities of gait and mobility (R26.89);Difficulty in walking, not elsewhere classified (R26.2);Pain Pain - Right/Left: Left Pain - part of body: Leg     Time: 1020-1034 PT Time Calculation (min) (ACUTE ONLY): 14 min  Charges:  $Gait Training: 8-22 mins                    Hendricks Limes, PT 06/04/20, 1:24 PM

## 2020-06-04 NOTE — Plan of Care (Signed)
  Problem: Education: Goal: Knowledge of General Education information will improve Description: Including pain rating scale, medication(s)/side effects and non-pharmacologic comfort measures Outcome: Progressing   Problem: Health Behavior/Discharge Planning: Goal: Ability to manage health-related needs will improve Outcome: Progressing   Problem: Health Behavior/Discharge Planning: Goal: Ability to manage health-related needs will improve Outcome: Progressing   Problem: Clinical Measurements: Goal: Will remain free from infection Outcome: Progressing   Problem: Activity: Goal: Risk for activity intolerance will decrease Outcome: Progressing   Problem: Coping: Goal: Level of anxiety will decrease Outcome: Progressing

## 2020-06-04 NOTE — Consult Note (Signed)
NAME: Stephen [name]  DOB: 1965-11-11  MRN: [id number]  Date/Time: 06/04/2020 9:51 PM  REQUESTING PROVIDER Subjective:  REASON FOR CONSULT:  ? Stephen [name] is a 54 y.o. 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 He was doing a long drive to [place] when he noted the left leg was swelling 5 days before- He poured some rubbng alcohol on it. He drove back to [place] and  On Saturday noted the leg was red and weeping .came to the ED on Monday He had some chills , He does not remember any trauma. No animal or insect bites, no fishing, walks with flip flops and hence could have stepped in dirty water Took some ibuprofen when the leg hurt Not on any meds Has not seen a medical provider in a long time BP 05/31/20 1003 126/75     Pulse Rate 05/31/20 1003 95     Resp 05/31/20 1003 20     Temp 05/31/20 1007 97.9 F (36.6 C)     Temp Source 05/31/20 1007 Oral     SpO2 05/31/20 1003 96 %     Weight 05/31/20 1004 300 lb (136.1 kg)   Labs revealed wbc 33, HB 14.6, cr 1.49, CRP 40, procal 4.22.Marland KitchenStarted on vanco /ceftriaxone   MRI of the leg on showed diffuse skin thickening with edema and subcutaneous edema extending to the deep fascial layers. There is non loculated fluid within the deep fascial layers. Overlying superficial epidermal cyst/boils are seen. Wbc has slowly decreased to 27. Pt says he feels better than before- could not move his leg before   Pt says he never had a similar episode in the past No surgery to the leg before    Past Medical History:  Diagnosis Date  . Hypertension     Past Surgical History:  Procedure Laterality Date  . TONSILLECTOMY      Social History   Socioeconomic History  . Marital status: Single    Spouse name: Not on file  . Number of children: Not on file  . Years of education: Not on file  . Highest  education level: Not on file  Occupational History  . Occupation: Truck Hospital doctor  Tobacco Use  . Smoking status: Never Smoker  . Smokeless tobacco: Never Used  Vaping Use  . Vaping Use: Never used  Substance and Sexual Activity  . Alcohol use: Yes  . Drug use: No  . Sexual activity: Not on file  Other Topics Concern  . Not on file  Social History Narrative  . Not on file   Social Determinants of Health   Financial Resource Strain:   . Difficulty of Paying Living Expenses: Not on file  Food Insecurity:   . Worried About Programme researcher, broadcasting/film/video in the Last Year: Not on file  . Ran Out of Food in the Last Year: Not on file  Transportation Needs:   . Lack of Transportation (Medical): Not on file  . Lack of Transportation (Non-Medical): Not on file  Physical Activity:   . Days of Exercise per Week: Not on file  . Minutes of Exercise per Session: Not on file  Stress:   . Feeling of Stress : Not on file  Social Connections:   . Frequency of Communication with Friends and Family: Not on file  . Frequency of Social Gatherings  with Friends and Family: Not on file  . Attends Religious Services: Not on file  . Active Member of Clubs or Organizations: Not on file  . Attends BankerClub or Organization Meetings: Not on file  . Marital Status: Not on file  Intimate Partner Violence:   . Fear of Current or Ex-Partner: Not on file  . Emotionally Abused: Not on file  . Physically Abused: Not on file  . Sexually Abused: Not on file    Family History  Problem Relation Age of Onset  . Hypertension Mother    No Known Allergies  ? Current Facility-Administered Medications  Medication Dose Route Frequency Provider Last Rate Last Admin  . acetaminophen (TYLENOL) tablet 650 mg  650 mg Oral Q6H PRN Lolita PatellaSreenath, Sudheer B, MD   650 mg at 06/04/20 0206  . bisacodyl (DULCOLAX) suppository 10 mg  10 mg Rectal Daily PRN Lolita PatellaSreenath, Sudheer B, MD   10 mg at 06/01/20 1001  . cefTRIAXone (ROCEPHIN) 2 g in sodium  chloride 0.9 % 100 mL IVPB  2 g Intravenous Q24H Lorretta HarpNiu, Xilin, MD 200 mL/hr at 06/04/20 0952 2 g at 06/04/20 0952  . enoxaparin (LOVENOX) injection 67.5 mg  0.5 mg/kg Subcutaneous Q24H Lorretta HarpNiu, Xilin, MD   67.5 mg at 06/04/20 0936  . furosemide (LASIX) injection 40 mg  40 mg Intravenous BID Darlin PriestlyLai, Tina, MD   40 mg at 06/04/20 1907  . hydrALAZINE (APRESOLINE) injection 5 mg  5 mg Intravenous Q2H PRN Lorretta HarpNiu, Xilin, MD      . hydrOXYzine (ATARAX/VISTARIL) tablet 100 mg  100 mg Oral QHS Darlin PriestlyLai, Tina, MD      . lidocaine (LIDODERM) 5 % 1 patch  1 patch Transdermal Q24H Lolita PatellaSreenath, Sudheer B, MD   1 patch at 06/04/20 [phone]  . linezolid (ZYVOX) IVPB 600 mg  600 mg Intravenous Q12H Darlin PriestlyLai, Tina, MD 300 mL/hr at 06/04/20 1115 600 mg at 06/04/20 1115  . LORazepam (ATIVAN) injection 1 mg  1 mg Intravenous Once PRN Darlin PriestlyLai, Tina, MD      . methocarbamol (ROBAXIN) tablet 750 mg  750 mg Oral Q6H PRN Darlin PriestlyLai, Tina, MD   750 mg at 06/04/20 0948  . ondansetron (ZOFRAN) injection 4 mg  4 mg Intravenous Q8H PRN Lorretta HarpNiu, Xilin, MD      . senna-docusate (Senokot-S) tablet 1 tablet  1 tablet Oral BID Lolita PatellaSreenath, Sudheer B, MD   1 tablet at 06/04/20 0935  . traMADol (ULTRAM) tablet 50 mg  50 mg Oral Q6H PRN Darlin PriestlyLai, Tina, MD   50 mg at 06/04/20 2002  . traZODone (DESYREL) tablet 50 mg  50 mg Oral QHS PRN Lolita PatellaSreenath, Sudheer B, MD   50 mg at 06/03/20 2155     Abtx:  Anti-infectives (From admission, onward)   Start     Dose/Rate Route Frequency Ordered Stop   06/03/20 1800  linezolid (ZYVOX) IVPB 600 mg        600 mg 300 mL/hr over 60 Minutes Intravenous Every 12 hours 06/03/20 1655     06/01/20 1500  clindamycin (CLEOCIN) IVPB 900 mg  Status:  Discontinued        900 mg 100 mL/hr over 30 Minutes Intravenous Every 8 hours 06/01/20 1334 06/02/20 1302   06/01/20 0400  vancomycin (VANCOCIN) IVPB 1000 mg/200 mL premix  Status:  Discontinued        1,000 mg 200 mL/hr over 60 Minutes Intravenous Every 12 hours 05/31/20 1658 06/03/20 1655   05/31/20 1500   vancomycin (VANCOREADY) IVPB 1500 mg/300  mL        1,500 mg 150 mL/hr over 120 Minutes Intravenous  Once 05/31/20 1358 05/31/20 1815   05/31/20 1415  cefTRIAXone (ROCEPHIN) 2 g in sodium chloride 0.9 % 100 mL IVPB        2 g 200 mL/hr over 30 Minutes Intravenous Every 24 hours 05/31/20 1404     05/31/20 1215  vancomycin (VANCOCIN) IVPB 1000 mg/200 mL premix        1,000 mg 200 mL/hr over 60 Minutes Intravenous  Once 05/31/20 1204 05/31/20 1500      REVIEW OF SYSTEMS:  Const:  fever, chills, negative weight gain of 50 pounds Eyes: negative diplopia or visual changes, negative eye pain ENT: negative coryza, negative sore throat Resp: negative cough, hemoptysis, has dyspnea Cards: negative for chest pain, palpitations, has  lower extremity edema GU: negative for frequency, dysuria and hematuria GI: Negative for abdominal pain, diarrhea, bleeding, constipation Skin: negative for rash and pruritus Heme: negative for easy bruising and gum/nose bleeding MS: fatigue Neurolo:negative for headaches, dizziness, vertigo, memory problems  Psych: negative for feelings of anxiety, depression  Endocrine: negative for thyroid, diabetes Allergy/Immunology- negative for any medication or food allergies ? Objective:  VITALS:  BP (!) 142/83 (BP Location: Left Arm)   Pulse 95   Temp 99.4 F (37.4 C) (Oral)   Resp 15   Ht 5\' 11"  (1.803 m)   Wt 136.1 kg   SpO2 97%   BMI 41.84 kg/m  PHYSICAL EXAM:  General: Alert, cooperative, no distress, appears stated age. Morbid obesity Head: Normocephalic, without obvious abnormality, atraumatic. Eyes: Conjunctivae clear, anicteric sclerae. Pupils are equal ENT Nares normal. No drainage or sinus tenderness. Lips, mucosa, and tongue normal. No Thrush Neck: Supple, symmetrical, no adenopathy, thyroid: non tender no carotid bruit and no JVD. Back:did not examine Lungs: b.l air entry Heart: Regular rate and rhythm, no murmur, rub or gallop. Abdomen: Soft,  non-tender,not distended. Bowel sounds normal. No masses Extremities:rt leg okay Left leg swollen, edematous, blisters      Some extension to lower thigh Callus left foot Skin: No rashes or lesions. Or bruising Lymph: Cervical, supraclavicular normal. Neurologic: Grossly non-focal Pertinent Labs Lab Results CBC    Component Value Date/Time   WBC 27.6 (H) 06/04/2020 0502   RBC 4.65 06/04/2020 0502   HGB 13.8 06/04/2020 0502   HCT 40.7 06/04/2020 0502   PLT 424 (H) 06/04/2020 0502   MCV 87.5 06/04/2020 0502   MCH 29.7 06/04/2020 0502   MCHC 33.9 06/04/2020 0502   RDW 15.2 06/04/2020 0502   LYMPHSABS 1.9 05/31/2020 1014   MONOABS 1.9 (H) 05/31/2020 1014   EOSABS 0.3 05/31/2020 1014   BASOSABS 0.0 05/31/2020 1014    CMP Latest Ref Rng & Units 06/04/2020 06/03/2020 06/02/2020  Glucose 70 - 99 mg/dL 06/04/2020) 660(Y) 301(S)  BUN 6 - 20 mg/dL 15 14 15   Creatinine 0.61 - 1.24 mg/dL 010(X) 3.23(F  Sodium 135 - 145 mmol/L 132(L) 136 134(L)  Potassium 3.5 - 5.1 mmol/L 3.9 4.4 4.2  Chloride 98 - 111 mmol/L 95(L) 103 99  CO2 22 - 32 mmol/L 25 24 25   Calcium 8.9 - 10.3 mg/dL 7.9(L) 7.7(L) 8.0(L)  Total Protein 6.5 - 8.1 g/dL - - -  Total Bilirubin 0.3 - 1.2 mg/dL - - -  Alkaline Phos 38 - 126 U/L - - -  AST 15 - 41 U/L - - -  ALT 0 - 44 U/L - - -  Microbiology: Recent Results (from the past 240 hour(s))  Blood culture (routine x 2)     Status: None (Preliminary result)   Collection Time: 05/31/20 10:14 AM   Specimen: BLOOD  Result Value Ref Range Status   Specimen Description BLOOD LEFT ASSIST CONTROL  Final   Special Requests   Final    BOTTLES DRAWN AEROBIC AND ANAEROBIC Blood Culture results may not be optimal due to an excessive volume of blood received in culture bottles   Culture   Final    NO GROWTH 4 DAYS Performed at [facility], [address] [place], [place] [address]    Report Status PENDING  Incomplete  Blood culture (routine x 2)      Status: None (Preliminary result)   Collection Time: 05/31/20  1:48 PM   Specimen: BLOOD  Result Value Ref Range Status   Specimen Description BLOOD BLOOD LEFT FOREARM  Final   Special Requests   Final    BOTTLES DRAWN AEROBIC AND ANAEROBIC Blood Culture adequate volume   Culture   Final    NO GROWTH 4 DAYS Performed at [facility], [address], [place], [place] [address]    Report Status PENDING  Incomplete  Respiratory Panel by RT PCR (Flu A&B, Covid) - Nasopharyngeal Swab     Status: None   Collection Time: 05/31/20  3:02 PM   Specimen: Nasopharyngeal Swab  Result Value Ref Range Status   SARS Coronavirus 2 by RT PCR NEGATIVE NEGATIVE Final    Comment: (NOTE) SARS-CoV-2 target nucleic acids are NOT DETECTED.  The SARS-CoV-2 RNA is generally detectable in upper respiratoy specimens during the acute phase of infection. The lowest concentration of SARS-CoV-2 viral copies this assay can detect is 131 copies/mL. A negative result does not preclude SARS-Cov-2 infection and should not be used as the sole basis for treatment or other patient management decisions. A negative result may occur with  improper specimen collection/handling, submission of specimen other than nasopharyngeal swab, presence of viral mutation(s) within the areas targeted by this assay, and inadequate number of viral copies (<131 copies/mL). A negative result must be combined with clinical observations, patient history, and epidemiological information. The expected result is Negative.  Fact Sheet for Patients:  https://www.moore.com/  Fact Sheet for Healthcare Providers:  https://www.young.biz/  This test is no t yet approved or cleared by the [place] FDA and  has been authorized for detection and/or diagnosis of SARS-CoV-2 by FDA under an Emergency Use Authorization (EUA). This EUA will remain  in effect (meaning this test can be used) for the  duration of the COVID-19 declaration under Section 564(b)(1) of the Act, 21 U.S.C. section 360bbb-3(b)(1), unless the authorization is terminated or revoked sooner.     Influenza A by PCR NEGATIVE NEGATIVE Final   Influenza B by PCR NEGATIVE NEGATIVE Final    Comment: (NOTE) The Xpert Xpress SARS-CoV-2/FLU/RSV assay is intended as an aid in  the diagnosis of influenza from Nasopharyngeal swab specimens and  should not be used as a sole basis for treatment. Nasal washings and  aspirates are unacceptable for Xpert Xpress SARS-CoV-2/FLU/RSV  testing.  Fact Sheet for Patients: https://www.moore.com/  Fact Sheet for Healthcare Providers: https://www.young.biz/  This test is not yet approved or cleared by the [place] FDA and  has been authorized for detection and/or diagnosis of SARS-CoV-2 by  FDA under an Emergency Use Authorization (EUA). This EUA will remain  in effect (meaning this test can be used) for the  duration of the  Covid-19 declaration under Section 564(b)(1) of the Act, 21  U.S.C. section 360bbb-3(b)(1), unless the authorization is  terminated or revoked. Performed at [facility], [address] [place], [place] [address]     IMAGING RESULTS: Findings of diffuse cellulitis with superficial cyst. Non loculated fluid within the fascial layers. No evidence of osteomyelitis or abscess. I have personally reviewed the films ? Impression/Recommendation ? 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 He drives with a flip flop and walks around wearing a flip flop- so possibility of trauma /injury to the foot Common organism are strep and staph He is on the appropriate antibiotics- linezolid and ceftriaxone. No purulence. Doubt gram neg Nec fascitis is less likely, but getting a repeat MRI of the leg t He says he has improved since  admission- leucocytosis slowly declining, He needs to keep the leg elevated on 4 pillows and may even need an ace bandage or coban later Continue same antibiotics- will be able to stop ceftriaxone soon. Sent cultures. linezolid can be made PO to avoid more fluid   ?HTN- on frusemide to reduce the swelling   Severe unilateral edema-  ? Possible obstructive sleep apnea May do 2 d echo to assess his Cardiac status /pulmonary HTn ___________________________________________________ Discussed with patient,and his nurse and care team ID will follow him peripherally this weekend. Call if needed Note:  This document was prepared using Dragon voice recognition software and may include unintentional dictation errors.

## 2020-06-04 NOTE — Progress Notes (Signed)
PROGRESS NOTE    Christin BachRodney Q Kuhner  GEX:[id number] DOB: 1966-02-11 DOA: 05/31/2020 PCP: Patient, No Pcp Per  Brief Narrative:  54 y.o. male with medical history significant of HTN, who presents with left leg pain, swelling, weeping.  Patient states that his left leg pain started approximately 5 days ago, which has been progressively worsening.  Left leg is erythematous, swelling, with significant blistering, open wound and weeping from several popped blisters. The pain is constant, sharp, moderate to severe, nonradiating. 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  Ultrasound on admission negative.  Patient presented with left leg complaints also complains of constipation neck pain.  Started on broad-spectrum IV antibiotics on admission.  Patient's main complaints are constipation and neck pain.  KUB negative for obstruction.  X-ray C-spine negative for fracture.    Assessment & Plan:   Principal Problem:   Left leg cellulitis Active Problems:   Hypertension   Sepsis (HCC)   Hypokalemia   AKI (acute kidney injury) (HCC)   Cellulitis of left leg  Severe Sepsis due to  left leg cellulitis:  Patient meets criteria for sepsis with leukocytosis with WBC 33.3 and tachycardia with heart rate 100 -->95.  with AKI. Lactate is normal, currently hemodynamically stable.  Left lower extremity venous Doppler is negative for DVT Marked leukocytosis Elevated inflammatory markers. --MR showed "Findings of diffuse cellulitis with superficial cyst. Non loculated fluid within the fascial layers. No evidence of osteomyelitis or abscess." --started on vanc/ceftriaxone, with IV clinda added later then d/c'ed again.  Vanc switched to Linezolid. Plan: --cont ceftriaxone and Linezolid --ID consult today --repeat MRI LLE to rule out abscess or fascitis   # Severe LLE edema # possible lymphedema --LLE edema worsened with development of a layer of edema with multiple blisters, likely  due to IVF given since presentation. 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 -IV hydralazine as needed  Hypokalemia: --replete PRN  AKI (acute kidney injury) (HCC) Creatinine 1.49, BUN 25, no baseline creatinine available,  may be due to dehydration versus sepsis. --Cr improved to 1.03 after IVF and tx of cellulitis Plan: --monitor Cr while diuresing.  Cervicalgia Patient is a truck driver Exam significant for rigid lump in posterior neck Noted to soft tissue on x-ray C-spine x-ray negative for fracture Plan: --cont lidocaine patch  Morbid obesity BMI 41.8 This complicates care and prognosis   DVT prophylaxis: Lovenox Code Status: Full Family Communication:   Status is: Inpatient   Dispo: The patient is from: Home              Anticipated d/c is to: Home              Anticipated d/c date is: undetermined              Patient currently is not medically stable to d/c.  Severe sepsis 2/2 cellulitis, currently on IV abx, not improving yet. Pending ID and vascular surgery consult.   Consultants:   None  Procedures:   None  Antimicrobials:   Vancomycin  Ceftriaxone  Clindamycin   Subjective: Pt reported less pain and walking better.  Having BM but in small amounts, so still felt constipated.    ID consulted, who rec vascular consult.   Objective: Vitals:   06/03/20 1611 06/04/20 0021 06/04/20 0727 06/04/20 1602  BP: 137/87 135/82 (!) 150/84 (!) 142/83  Pulse: 97 96 87 95  Resp: 17 18 18 15   Temp: 99.3 F (37.4 C) 98.8 F (37.1  C) 98.5 F (36.9 C) 99.4 F (37.4 C)  TempSrc: Oral Oral Oral Oral  SpO2: 97% 93% 96% 97%  Weight:      Height:        Intake/Output Summary (Last 24 hours) at 06/04/2020 1923 Last data filed at 06/04/2020 1440 Gross per 24 hour  Intake 1600 ml  Output 2100 ml  Net -500 ml   Filed Weights   05/31/20 1004  Weight: 136.1 kg    Examination:  Constitutional: NAD, AAOx3 HEENT: conjunctivae and lids normal, EOMI CV: No cyanosis.   RESP: normal respiratory effort, on RA Extremities: severe edema in LLE extending up to his thigh, with a layer of blister in addition to vesicles. SKIN: warm, dry and intact Neuro: II - XII grossly intact.   Psych: Normal mood and affect.     05/31/20    06/02/20      Data Reviewed: I have personally reviewed following labs and imaging studies  CBC: Recent Labs  Lab 05/31/20 1014 06/01/20 0608 06/02/20 1100 06/03/20 0526 06/04/20 0502  WBC 33.3* 31.4* 31.0* 30.0* 27.6*  NEUTROABS 25.8*  --   --   --   --   HGB 14.6 13.0 13.2 12.3* 13.8  HCT 43.2 38.4* 39.3 36.8* 40.7  MCV 85.7 87.1 86.9 87.8 87.5  PLT 361 334 347 356 424*   Basic Metabolic Panel: Recent Labs  Lab 05/31/20 1014 05/31/20 1014 05/31/20 1326 06/01/20 0608 06/02/20 0513 06/02/20 1100 06/03/20 0526 06/04/20 0502  NA 132*  --   --  131*  --  134* 136 132*  K 3.4*  --   --  3.6  --  4.2 4.4 3.9  CL 98  --   --  98  --  99 103 95*  CO2 22  --   --  23  --  25 24 25   GLUCOSE 129*  --   --  169*  --  145* 106* 113*  BUN 25*  --   --  20  --  15 14 15   CREATININE 1.49*   < >  --  1.21 1.22 1.03 1.10 1.30*  CALCIUM 8.3*  --   --  7.8*  --  8.0* 7.7* 7.9*  MG  --   --  2.5*  --   --  2.3 2.3 2.3   < > = values in this interval not displayed.   GFR: Estimated Creatinine Clearance: 91.5 mL/min (A) (by C-G formula based on SCr of 1.3 mg/dL (H)). Liver Function Tests: Recent Labs  Lab 05/31/20 1014  AST 26  ALT 36  ALKPHOS 116  BILITOT 1.5*  PROT 8.1    ALBUMIN 3.0*   No results for input(s): LIPASE, AMYLASE in the last 168 hours. No results for input(s): AMMONIA in the last 168 hours. Coagulation Profile: Recent Labs  Lab 05/31/20 1326  INR 1.3*   Cardiac Enzymes: No results for input(s): CKTOTAL, CKMB, CKMBINDEX, TROPONINI in the last 168 hours. BNP (last 3 results) No results for input(s): PROBNP in the last 8760 hours. HbA1C: No results for input(s): HGBA1C in the last 72 hours. CBG:  No results for input(s): GLUCAP in the last 168 hours. Lipid Profile: No results for input(s): CHOL, HDL, LDLCALC, TRIG, CHOLHDL, LDLDIRECT in the last 72 hours. Thyroid Function Tests: No results for input(s): TSH, T4TOTAL, FREET4, T3FREE, THYROIDAB in the last 72 hours. Anemia Panel: No results for input(s): VITAMINB12, FOLATE, FERRITIN, TIBC, IRON, RETICCTPCT in the last 72 hours. Sepsis Labs: Recent Labs  Lab 05/31/20 1014 05/31/20 1214 05/31/20 1326  PROCALCITON  --   --  4.22  LATICACIDVEN 1.7 1.5  --     Recent Results (from the past 240 hour(s))  Blood culture (routine x 2)     Status: None (Preliminary result)   Collection Time: 05/31/20 10:14 AM   Specimen: BLOOD  Result Value Ref Range Status   Specimen Description BLOOD LEFT ASSIST CONTROL  Final   Special Requests   Final    BOTTLES DRAWN AEROBIC AND ANAEROBIC Blood Culture results may not be optimal due to an excessive volume of blood received in culture bottles   Culture   Final    NO GROWTH 4 DAYS Performed at [facility], [address] [place], [place] [address]    Report Status PENDING  Incomplete  Blood culture (routine x 2)     Status: None (Preliminary result)   Collection Time: 05/31/20  1:48 PM   Specimen: BLOOD  Result Value Ref Range Status   Specimen Description BLOOD BLOOD LEFT FOREARM  Final   Special Requests   Final    BOTTLES DRAWN AEROBIC AND ANAEROBIC Blood Culture adequate volume   Culture   Final    NO GROWTH 4 DAYS Performed  at [facility], [address], [place], [place] [address]    Report Status PENDING  Incomplete  Respiratory Panel by RT PCR (Flu A&B, Covid) - Nasopharyngeal Swab     Status: None   Collection Time: 05/31/20  3:02 PM   Specimen: Nasopharyngeal Swab  Result Value Ref Range Status   SARS Coronavirus 2 by RT PCR NEGATIVE NEGATIVE Final    Comment: (NOTE) SARS-CoV-2 target nucleic acids are NOT DETECTED.  The SARS-CoV-2 RNA is generally detectable in upper respiratoy specimens during the acute phase of infection. The lowest concentration of SARS-CoV-2 viral copies this assay can detect is 131 copies/mL. A negative result does not preclude SARS-Cov-2 infection and should not be used as the sole basis for treatment or other patient management decisions. A negative result may occur with  improper specimen collection/handling, submission of specimen other than nasopharyngeal swab, presence of viral mutation(s) within the areas targeted by this assay, and inadequate number of viral copies (<131 copies/mL). A negative result must be combined with clinical observations, patient history, and epidemiological information. The expected result is Negative.  Fact Sheet for Patients:  https://www.moore.com/  Fact Sheet for Healthcare Providers:  https://www.young.biz/  This test is no t yet approved or cleared by the [place] FDA and  has been authorized for detection and/or diagnosis of SARS-CoV-2 by FDA under an Emergency Use Authorization (EUA). This EUA will remain  in effect (meaning this test can be used) for the duration of the COVID-19 declaration under Section 564(b)(1) of the Act, 21 U.S.C. section 360bbb-3(b)(1), unless the authorization is terminated or revoked sooner.     Influenza A by PCR NEGATIVE NEGATIVE Final   Influenza B by PCR NEGATIVE NEGATIVE Final    Comment: (NOTE) The Xpert Xpress SARS-CoV-2/FLU/RSV assay is  intended as an aid in  the diagnosis of influenza  from Nasopharyngeal swab specimens and  should not be used as a sole basis for treatment. Nasal washings and  aspirates are unacceptable for Xpert Xpress SARS-CoV-2/FLU/RSV  testing.  Fact Sheet for Patients: https://www.moore.com/  Fact Sheet for Healthcare Providers: https://www.young.biz/  This test is not yet approved or cleared by the [place] FDA and  has been authorized for detection and/or diagnosis of SARS-CoV-2 by  FDA under an Emergency Use Authorization (EUA). This EUA will remain  in effect (meaning this test can be used) for the duration of the  Covid-19 declaration under Section 564(b)(1) of the Act, 21  U.S.C. section 360bbb-3(b)(1), unless the authorization is  terminated or revoked. Performed at [facility], [address], [place], [place] [address]          Radiology Studies: No results found.      Scheduled Meds: . enoxaparin (LOVENOX) injection  0.5 mg/kg Subcutaneous Q24H  . furosemide  40 mg Intravenous BID  . hydrOXYzine  100 mg Oral QHS  . lidocaine  1 patch Transdermal Q24H  . senna-docusate  1 tablet Oral BID   Continuous Infusions: . cefTRIAXone (ROCEPHIN)  IV 2 g (06/04/20 0952)  . linezolid (ZYVOX) IV 600 mg (06/04/20 1115)     LOS: 4 days   Darlin Priestly, MD [facility] Pager [phone]  If 7PM-7AM, please contact night-coverage 06/04/2020, 7:23 PM

## 2020-06-04 NOTE — Progress Notes (Signed)
The patient was in the bathroom on the commode and so I could not complete my examination however the chart has been reviewed.  I have reordered the MRI as a follow-up to ensure there is not interval formation of an abscess as well as to evaluate whether the infection has progressed into the fascia and the muscle.  At the present time he appears to have a severe infection of his soft tissues.  Given his profound venous stasis disease and the skin changes accompanying this taking him to surgery urgently for exploration and opening of multiple incisions would be catastrophic and likely lead to limb loss.  Having said that, he may still lose his limb given the severity of his soft tissue infection.  We will follow-up and evaluate the patient once his repeat MRI has been finalized.

## 2020-06-04 NOTE — Progress Notes (Signed)
Received report from Chris, RN. Assuming care of patient at this time.  

## 2020-06-04 NOTE — Progress Notes (Signed)
Formatting of this note is different from the original.  Physical Therapy Treatment  Patient Details  Name: Thomas Browning  MRN: [id number]  DOB: October 18, 1965  Today's Date: 06/04/2020    History of Present Illness Pt is a 54 y.o. male presenting to hospital 10/25 with L leg redness, swelling, pain, and weapage.  Pt admitted with sepsis d/t L leg cellulitis, htn, hypokalemia, AKI, and cervicalgia (rigid lump posterior neck).  PMH includes htn.      PT Comments      Pt resting in bed upon PT arrival; pre-medicated with pain medication.  Able to progress to ambulating 200 feet with RW CGA to SBA; antalgic gait d/t L LE pain although pt reporting improved comfort compared to yesterday.  3-4/10 L LE pain at rest end of session (increased to 6-7/10 with ambulation).  Will continue to progress pt with ambulation and trial stairs as appropriate.    Follow Up Recommendations   Home health PT      Equipment Recommendations   Rolling walker with 5" wheels;3in1 (PT)     Recommendations for Other Services        Precautions / Restrictions Precautions  Precautions: Fall  Precaution Comments: LLE wound  Restrictions  Weight Bearing Restrictions: No      Mobility   Bed Mobility  Overal bed mobility: Needs Assistance  Bed Mobility: Supine to Sit;Sit to Supine      Supine to sit: Modified independent (Device/Increase time);HOB elevated  Sit to supine: Modified independent (Device/Increase time);HOB elevated    General bed mobility comments: mild increased effort to move L LE d/t pain and swelling    Transfers  Overall transfer level: Needs assistance  Equipment used: Rolling walker (2 wheeled)  Transfers: Sit to/from Stand  Sit to Stand: Supervision          General transfer comment: mild increased effort to stand with UE support and weight more through R LE (d/t L LE pain)    Ambulation/Gait  Ambulation/Gait assistance: Min guard;Supervision  Gait Distance (Feet): 200 Feet  Assistive device: Rolling walker (2 wheeled)    Gait velocity:  mildly decreased    General Gait Details: antalgic; decreased stance time L LE; steady with RW    Stairs  Stairs:  (pt declined (pt reporting not wanting to overdo it d/t L LE pain))            Wheelchair Mobility      Modified Rankin (Stroke Patients Only)        Balance Overall balance assessment: Needs assistance  Sitting-balance support: No upper extremity supported;Feet supported  Sitting balance-Leahy Scale: Normal  Sitting balance - Comments: steady sitting reaching outside BOS    Standing balance support: Bilateral upper extremity supported;During functional activity  Standing balance-Leahy Scale: Good  Standing balance comment: steady ambulating with RW                              Cognition Arousal/Alertness: Awake/alert  Behavior During Therapy: WFL for tasks assessed/performed  Overall Cognitive Status: Within Functional Limits for tasks assessed                                          Exercises        General Comments General comments (skin integrity, edema, etc.): L LE red and edematous; chucks underneath  to collect fluid.  Pt agreeable to PT session.        Pertinent Vitals/Pain Pain Assessment: 0-10  Pain Score: 3   Pain Location: LLE  Pain Descriptors / Indicators: Aching  Pain Intervention(s): Limited activity within patient's tolerance;Monitored during session;Premedicated before session;Repositioned   Vitals (HR and O2 on room air) stable and WFL throughout treatment session.     Home Living                      Prior Function             PT Goals (current goals can now be found in the care plan section) Acute Rehab PT Goals  Patient Stated Goal: to improve L LE pain and mobility  PT Goal Formulation: With patient  Time For Goal Achievement: 06/16/20  Potential to Achieve Goals: Good  Progress towards PT goals: Progressing toward goals      Frequency     Min 2X/week      PT Plan Current plan remains appropriate     Co-evaluation                AM-PAC PT "6 Clicks" Mobility    Outcome Measure    Help needed turning from your back to your side while in a flat bed without using bedrails?: None  Help needed moving from lying on your back to sitting on the side of a flat bed without using bedrails?: None  Help needed moving to and from a bed to a chair (including a wheelchair)?: A Little  Help needed standing up from a chair using your arms (e.g., wheelchair or bedside chair)?: A Little  Help needed to walk in hospital room?: A Little  Help needed climbing 3-5 steps with a railing? : A Little  6 Click Score: 20      End of Session Equipment Utilized During Treatment: Gait belt  Activity Tolerance: Patient limited by pain  Patient left: in bed;with call bell/phone within reach;Other (comment) (L LE elevated on pillow)  Nurse Communication: Mobility status;Precautions (via white board)  PT Visit Diagnosis: Other abnormalities of gait and mobility (R26.89);Difficulty in walking, not elsewhere classified (R26.2);Pain  Pain - Right/Left: Left  Pain - part of body: Leg      Time: 1020-1034  PT Time Calculation (min) (ACUTE ONLY): 14 min    Charges:  $Gait Training: 8-22 mins             Hendricks Limes, PT  06/04/20, 1:24 PM      Electronically signed by Hendricks Limes, PT at 06/04/2020  1:30 PM EDT

## 2020-06-04 NOTE — Progress Notes (Signed)
Formatting of this note might be different from the original.  The patient was in the bathroom on the commode and so I could not complete my examination however the chart has been reviewed.    I have reordered the MRI as a follow-up to ensure there is not interval formation of an abscess as well as to evaluate whether the infection has progressed into the fascia and the muscle.  At the present time he appears to have a severe infection of his soft tissues.  Given his profound venous stasis disease and the skin changes accompanying this taking him to surgery urgently for exploration and opening of multiple incisions would be catastrophic and likely lead to limb loss.  Having said that, he may still lose his limb given the severity of his soft tissue infection.    We will follow-up and evaluate the patient once his repeat MRI has been finalized.      Electronically signed by Renford Dills, MD at 06/04/2020  6:24 PM EDT

## 2020-06-04 NOTE — Progress Notes (Signed)
Formatting of this note might be different from the original.  Received report from [place], [place]. Assuming care of patient at this time.  Electronically signed by Inez Pilgrim, RN at 06/04/2020  7:42 PM EDT

## 2020-06-04 NOTE — Unmapped (Signed)
Formatting of this note is different from the original.  Images from the original note were not included.  NAME: Thomas Browning   DOB: Jan 29, 1966   MRN: [id number]   Date/Time: 06/04/2020 9:51 PM    REQUESTING PROVIDER  Subjective:   REASON FOR CONSULT:   ?  Thomas Browning is a 54 y.o. 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 He was doing a long drive to [place] when he noted the left leg was swelling 5 days before- He poured some rubbng alcohol on it. He drove back to [place] and  On Saturday noted the leg was red and weeping .came to the ED on Monday  He had some chills , He does not remember any trauma. No animal or insect bites, no fishing, walks with flip flops and hence could have stepped in dirty water  Took some ibuprofen when the leg hurt  Not on any meds  Has not seen a medical provider in a long time  BP 05/31/20 1003 126/75      Pulse Rate 05/31/20 1003 95      Resp 05/31/20 1003 20      Temp 05/31/20 1007 97.9 F (36.6 C)      Temp Source 05/31/20 1007 Oral      SpO2 05/31/20 1003 96 %      Weight 05/31/20 1004 300 lb (136.1 kg)     Labs revealed wbc 33, HB 14.6, cr 1.49, CRP 40, procal 4.22.Marland KitchenStarted on vanco /ceftriaxone    MRI of the leg on showed diffuse skin thickening with edema and subcutaneous edema extending to the deep fascial layers. There is non loculated fluid within the deep fascial layers. Overlying superficial epidermal cyst/boils are seen.  Wbc has slowly decreased to 27. Pt says he feels better than before- could not move his leg before    Pt says he never had a similar episode in the past  No surgery to the leg before    Past Medical History:   Diagnosis Date   ? Hypertension      Past Surgical History:   Procedure Laterality Date   ? TONSILLECTOMY       Social History     Socioeconomic History   ? Marital status: Single     Spouse name: Not  on file   ? Number of children: Not on file   ? Years of education: Not on file   ? Highest education level: Not on file   Occupational History   ? Occupation: Truck [employer] doctor   Tobacco Use   ? Smoking status: Never Smoker   ? Smokeless tobacco: Never Used   Vaping Use   ? Vaping Use: Never used   Substance and Sexual Activity   ? Alcohol use: Yes   ? Drug use: No   ? Sexual activity: Not on file   Other Topics Concern   ? Not on file   Social History Narrative   ? Not on file     Social Determinants of Health     Financial Resource Strain:    ? Difficulty of Paying Living Expenses: Not on file   Food Insecurity:    ? Worried About Running Out of Food in the Last Year: Not on file   ? Ran Out of Food in the Last Year: Not on  file   Transportation Needs:    ? Freight forwarder (Medical): Not on file   ? Lack of Transportation (Non-Medical): Not on file   Physical Activity:    ? Days of Exercise per Week: Not on file   ? Minutes of Exercise per Session: Not on file   Stress:    ? Feeling of Stress : Not on file   Social Connections:    ? Frequency of Communication with Friends and Family: Not on file   ? Frequency of Social Gatherings with Friends and Family: Not on file   ? Attends Religious Services: Not on file   ? Active Member of Clubs or Organizations: Not on file   ? Attends Banker Meetings: Not on file   ? Marital Status: Not on file   Intimate Partner Violence:    ? Fear of Current or Ex-Partner: Not on file   ? Emotionally Abused: Not on file   ? Physically Abused: Not on file   ? Sexually Abused: Not on file     Family History   Problem Relation Age of Onset   ? Hypertension Mother      No Known Allergies    ?  Current Facility-Administered Medications   Medication Dose Route Frequency Provider Last Rate Last Admin   ? acetaminophen (TYLENOL) tablet 650 mg  650 mg Oral Q6H PRN Lolita Patella B, MD   650 mg at 06/04/20 0206   ? bisacodyl (DULCOLAX) suppository 10 mg  10 mg Rectal Daily PRN  Lolita Patella B, MD   10 mg at 06/01/20 1001   ? cefTRIAXone (ROCEPHIN) 2 g in sodium chloride 0.9 % 100 mL IVPB  2 g Intravenous Q24H Lorretta Harp, MD 200 mL/hr at 06/04/20 0952 2 g at 06/04/20 4782   ? enoxaparin (LOVENOX) injection 67.5 mg  0.5 mg/kg Subcutaneous Q24H Lorretta Harp, MD   67.5 mg at 06/04/20 9562   ? furosemide (LASIX) injection 40 mg  40 mg Intravenous BID Darlin Priestly, MD   40 mg at 06/04/20 1907   ? hydrALAZINE (APRESOLINE) injection 5 mg  5 mg Intravenous Q2H PRN Lorretta Harp, MD       ? hydrOXYzine (ATARAX/VISTARIL) tablet 100 mg  100 mg Oral QHS Darlin Priestly, MD       ? lidocaine (LIDODERM) 5 % 1 patch  1 patch Transdermal Q24H Lolita Patella B, MD   1 patch at 06/04/20 [phone]   ? linezolid (ZYVOX) IVPB 600 mg  600 mg Intravenous Q12H Darlin Priestly, MD 300 mL/hr at 06/04/20 1115 600 mg at 06/04/20 1115   ? LORazepam (ATIVAN) injection 1 mg  1 mg Intravenous Once PRN Darlin Priestly, MD       ? methocarbamol (ROBAXIN) tablet 750 mg  750 mg Oral Q6H PRN Darlin Priestly, MD   750 mg at 06/04/20 0948   ? ondansetron (ZOFRAN) injection 4 mg  4 mg Intravenous Q8H PRN Lorretta Harp, MD       ? senna-docusate (Senokot-S) tablet 1 tablet  1 tablet Oral BID Lolita Patella B, MD   1 tablet at 06/04/20 0935   ? traMADol (ULTRAM) tablet 50 mg  50 mg Oral Q6H PRN Darlin Priestly, MD   50 mg at 06/04/20 2002   ? traZODone (DESYREL) tablet 50 mg  50 mg Oral QHS PRN Lolita Patella B, MD   50 mg at 06/03/20 2155       Abtx:   Anti-infectives (From admission, onward)  Start     Dose/Rate Route Frequency Ordered Stop    06/03/20 1800  linezolid (ZYVOX) IVPB 600 mg         600 mg  300 mL/hr over 60 Minutes Intravenous Every 12 hours 06/03/20 1655      06/01/20 1500  clindamycin (CLEOCIN) IVPB 900 mg  Status:  Discontinued         900 mg  100 mL/hr over 30 Minutes Intravenous Every 8 hours 06/01/20 1334 06/02/20 1302    06/01/20 0400  vancomycin (VANCOCIN) IVPB 1000 mg/200 mL premix  Status:  Discontinued         1,000 mg  200 mL/hr over  60 Minutes Intravenous Every 12 hours 05/31/20 1658 06/03/20 1655    05/31/20 1500  vancomycin (VANCOREADY) IVPB 1500 mg/300 mL         1,500 mg  150 mL/hr over 120 Minutes Intravenous  Once 05/31/20 1358 05/31/20 1815    05/31/20 1415  cefTRIAXone (ROCEPHIN) 2 g in sodium chloride 0.9 % 100 mL IVPB         2 g  200 mL/hr over 30 Minutes Intravenous Every 24 hours 05/31/20 1404      05/31/20 1215  vancomycin (VANCOCIN) IVPB 1000 mg/200 mL premix         1,000 mg  200 mL/hr over 60 Minutes Intravenous  Once 05/31/20 1204 05/31/20 1500       REVIEW OF SYSTEMS:   Const:  fever, chills, negative weight gain of 50 pounds  Eyes: negative diplopia or visual changes, negative eye pain  ENT: negative coryza, negative sore throat  Resp: negative cough, hemoptysis, has dyspnea  Cards: negative for chest pain, palpitations, has  lower extremity edema  GU: negative for frequency, dysuria and hematuria  GI: Negative for abdominal pain, diarrhea, bleeding, constipation  Skin: negative for rash and pruritus  Heme: negative for easy bruising and gum/nose bleeding  MS: fatigue  Neurolo:negative for headaches, dizziness, vertigo, memory problems   Psych: negative for feelings of anxiety, depression   Endocrine: negative for thyroid, diabetes  Allergy/Immunology- negative for any medication or food allergies  ?  Objective:   VITALS:   BP (!) 142/83 (BP Location: Left Arm)   Pulse 95   Temp 99.4 F (37.4 C) (Oral)   Resp 15   Ht 5\' 11"  (1.803 m)   Wt 136.1 kg   SpO2 97%   BMI 41.84 kg/m   PHYSICAL EXAM:   General: Alert, cooperative, no distress, appears stated age. Morbid obesity  Head: Normocephalic, without obvious abnormality, atraumatic.  Eyes: Conjunctivae clear, anicteric sclerae. Pupils are equal  ENT Nares normal. No drainage or sinus tenderness.  Lips, mucosa, and tongue normal. No Thrush  Neck: Supple, symmetrical, no adenopathy, thyroid: non tender  no carotid bruit and no JVD.  Back:did not examine  Lungs: b.l air  entry  Heart: Regular rate and rhythm, no murmur, rub or gallop.  Abdomen: Soft, non-tender,not distended. Bowel sounds normal. No masses  Extremities:rt leg okay  Left leg swollen, edematous, blisters    Some extension to lower thigh  Callus left foot  Skin: No rashes or lesions. Or bruising  Lymph: Cervical, supraclavicular normal.  Neurologic: Grossly non-focal  Pertinent Labs  Lab Results  CBC    Component Value Date/Time    WBC 27.6 (H) 06/04/2020 0502    RBC 4.65 06/04/2020 0502    HGB 13.8 06/04/2020 0502    HCT 40.7 06/04/2020 0502    PLT  424 (H) 06/04/2020 0502    MCV 87.5 06/04/2020 0502    MCH 29.7 06/04/2020 0502    MCHC 33.9 06/04/2020 0502    RDW 15.2 06/04/2020 0502    LYMPHSABS 1.9 05/31/2020 1014    MONOABS 1.9 (H) 05/31/2020 1014    EOSABS 0.3 05/31/2020 1014    BASOSABS 0.0 05/31/2020 1014     CMP Latest Ref Rng & Units 06/04/2020 06/03/2020 06/02/2020   Glucose 70 - 99 mg/dL 161(W) 960(A) 540(J)   BUN 6 - 20 mg/dL 15 14 15    Creatinine 0.61 - 1.24 mg/dL 8.11(B) 1.47 8.29   Sodium 135 - 145 mmol/L 132(L) 136 134(L)   Potassium 3.5 - 5.1 mmol/L 3.9 4.4 4.2   Chloride 98 - 111 mmol/L 95(L) 103 99   CO2 22 - 32 mmol/L 25 24 25    Calcium 8.9 - 10.3 mg/dL 7.9(L) 7.7(L) 8.0(L)   Total Protein 6.5 - 8.1 g/dL - - -   Total Bilirubin 0.3 - 1.2 mg/dL - - -   Alkaline Phos 38 - 126 U/L - - -   AST 15 - 41 U/L - - -   ALT 0 - 44 U/L - - -     Microbiology:  Recent Results (from the past 240 hour(s))   Blood culture (routine x 2)     Status: None (Preliminary result)    Collection Time: 05/31/20 10:14 AM    Specimen: BLOOD   Result Value Ref Range Status    Specimen Description BLOOD LEFT ASSIST CONTROL  Final    Special Requests   Final     BOTTLES DRAWN AEROBIC AND ANAEROBIC Blood Culture results may not be optimal due to an excessive volume of blood received in culture bottles    Culture   Final     NO GROWTH 4 DAYS  Performed at [facility], [address], [place], [place] [address]     Report  Status PENDING  Incomplete   Blood culture (routine x 2)     Status: None (Preliminary result)    Collection Time: 05/31/20  1:48 PM    Specimen: BLOOD   Result Value Ref Range Status    Specimen Description BLOOD BLOOD LEFT FOREARM  Final    Special Requests   Final     BOTTLES DRAWN AEROBIC AND ANAEROBIC Blood Culture adequate volume    Culture   Final     NO GROWTH 4 DAYS  Performed at [facility], [address], [place], [place] [address]     Report Status PENDING  Incomplete   Respiratory Panel by RT PCR (Flu A&B, Covid) - Nasopharyngeal Swab     Status: None    Collection Time: 05/31/20  3:02 PM    Specimen: Nasopharyngeal Swab   Result Value Ref Range Status    SARS Coronavirus 2 by RT PCR NEGATIVE NEGATIVE Final     Comment: (NOTE)  SARS-CoV-2 target nucleic acids are NOT DETECTED.    The SARS-CoV-2 RNA is generally detectable in upper respiratoy  specimens during the acute phase of infection. The lowest  concentration of SARS-CoV-2 viral copies this assay can detect is  131 copies/mL. A negative result does not preclude SARS-Cov-2  infection and should not be used as the sole basis for treatment or  other patient management decisions. A negative result may occur with   improper specimen collection/handling, submission of specimen other  than nasopharyngeal swab, presence of viral mutation(s) within the  areas  targeted by this assay, and inadequate number of viral copies  (<131 copies/mL). A negative result must be combined with clinical  observations, patient history, and epidemiological information. The  expected result is Negative.    Fact Sheet for Patients:   https://www.moore.com/    Fact Sheet for Healthcare Providers:   https://www.young.biz/    This test is no t yet approved or cleared by the [place] FDA and   has been authorized for detection and/or diagnosis of SARS-CoV-2 by  FDA under an Emergency Use Authorization (EUA). This EUA will  remain   in effect (meaning this test can be used) for the duration of the  COVID-19 declaration under Section 564(b)(1) of the Act, 21 U.S.C.  section 360bbb-3(b)(1), unless the authorization is terminated or  revoked sooner.       Influenza A by PCR NEGATIVE NEGATIVE Final    Influenza B by PCR NEGATIVE NEGATIVE Final     Comment: (NOTE)  The Xpert Xpress SARS-CoV-2/FLU/RSV assay is intended as an aid in   the diagnosis of influenza from Nasopharyngeal swab specimens and   should not be used as a sole basis for treatment. Nasal washings and   aspirates are unacceptable for Xpert Xpress SARS-CoV-2/FLU/RSV   testing.    Fact Sheet for Patients:  https://www.moore.com/    Fact Sheet for Healthcare Providers:  https://www.young.biz/    This test is not yet approved or cleared by the [place] FDA and   has been authorized for detection and/or diagnosis of SARS-CoV-2 by   FDA under an Emergency Use Authorization (EUA). This EUA will remain   in effect (meaning this test can be used) for the duration of the   Covid-19 declaration under Section 564(b)(1) of the Act, 21   U.S.C. section 360bbb-3(b)(1), unless the authorization is   terminated or revoked.  Performed at [facility], [address] [place],  [place] [address]      IMAGING RESULTS:  Findings of diffuse cellulitis with superficial cyst. Non loculated  fluid within the fascial layers. No evidence of osteomyelitis or  abscess.  I have personally reviewed the films  ?  Impression/Recommendation  ?  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 He drives with a flip flop and walks around wearing a flip flop- so possibility of trauma /injury to the foot  Common organism are strep and staph  He is on the appropriate antibiotics- linezolid and ceftriaxone. No purulence. Doubt gram neg  Nec fascitis is less likely, but getting a  repeat MRI of the leg t  He says he has improved since admission- leucocytosis slowly declining,  He needs to keep the leg elevated on 4 pillows and may even need an ace bandage or coban later  Continue same antibiotics- will be able to stop ceftriaxone soon. Sent cultures. linezolid can be made PO to avoid more fluid    ?HTN- on frusemide to reduce the swelling     Severe unilateral edema-   ?  Possible obstructive sleep apnea  May do 2 d echo to assess his Cardiac status /pulmonary HTn  ___________________________________________________  Discussed with patient,and his nurse and care team  ID will follow him peripherally this weekend. Call if needed  Note:  This document was prepared using Dragon voice recognition software and may include unintentional dictation errors.  Electronically signed by Lynn Ito, MD at  06/04/2020 10:40 PM EDT

## 2020-06-04 NOTE — Progress Notes (Signed)
Formatting of this note is different from the original.  Images from the original note were not included.    PROGRESS NOTE    Thomas Browning  WJX:[id number] DOB: 1966/01/27 DOA: 05/31/2020  PCP: Patient, No Pcp Per   Brief Narrative:   54 y.o. male with medical history significant of HTN, who presents with left leg pain, swelling, weeping.    Patient states that his left leg pain started approximately 5 days ago, which has been progressively worsening.  Left leg is erythematous, swelling, with significant blistering, open wound and weeping from several popped blisters. The pain is constant, sharp, moderate to severe, nonradiating. 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  Ultrasound on admission negative.    Patient presented with left leg complaints also complains of constipation neck pain.  Started on broad-spectrum IV antibiotics on admission.    Patient's main complaints are constipation and neck pain.  KUB negative for obstruction.  X-ray C-spine negative for fracture.    Assessment & Plan:    Principal Problem:    Left leg cellulitis  Active Problems:    Hypertension    Sepsis (HCC)    Hypokalemia    AKI (acute kidney injury) (HCC)    Cellulitis of left leg    Severe Sepsis due to   left leg cellulitis:   Patient meets criteria for sepsis with leukocytosis with WBC 33.3 and tachycardia with heart rate 100 -->95.  with AKI.  Lactate is normal, currently hemodynamically stable.   Left lower extremity venous Doppler is negative for DVT  Marked leukocytosis  Elevated inflammatory markers.  --MR showed "Findings of diffuse cellulitis with superficial cyst. Non loculated fluid within the fascial layers. No evidence of osteomyelitis or abscess."  --started on vanc/ceftriaxone, with IV clinda added later then d/c'ed again.  Vanc switched to Linezolid.  Plan:  --cont ceftriaxone and Linezolid  --ID consult today  --repeat MRI LLE to rule out abscess or fascitis     # Severe LLE edema  # possible  lymphedema  --LLE edema worsened with development of a layer of edema with multiple blisters, likely due to IVF given since presentation.  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  -IV hydralazine as needed    Hypokalemia:  --replete PRN    AKI (acute kidney injury) (HCC)  Creatinine 1.49, BUN 25, no baseline creatinine available,   may be due to dehydration versus sepsis.  --Cr improved to 1.03 after IVF and tx of cellulitis  Plan:  --monitor Cr while diuresing.    Cervicalgia  Patient is a truck driver  Exam significant for rigid lump in posterior neck  Noted to soft tissue on x-ray  C-spine x-ray negative for fracture  Plan:  --cont lidocaine patch    Morbid obesity  BMI 41.8  This complicates care and prognosis    DVT prophylaxis: Lovenox  Code Status: Full  Family Communication:    Status is: Inpatient    Dispo: The patient is from: Home  Anticipated d/c is to: Home               Anticipated d/c date is: undetermined               Patient currently is not medically stable to d/c.  Severe sepsis 2/2 cellulitis, currently on IV abx, not improving yet. Pending ID and vascular surgery consult.    Consultants:    None    Procedures:    None    Antimicrobials:    Vancomycin   Ceftriaxone   Clindamycin    Subjective:  Pt reported less pain and walking better.  Having BM but in small amounts, so still felt constipated.      ID consulted, who rec vascular consult.    Objective:  Vitals:    06/03/20 1611 06/04/20 0021 06/04/20 0727 06/04/20 1602   BP: 137/87 135/82 (!) 150/84  (!) 142/83   Pulse: 97 96 87 95   Resp: 17 18 18 15    Temp: 99.3 F (37.4 C) 98.8 F (37.1 C) 98.5 F (36.9 C) 99.4 F (37.4 C)   TempSrc: Oral Oral Oral Oral   SpO2: 97% 93% 96% 97%   Weight:       Height:         Intake/Output Summary (Last 24 hours) at 06/04/2020 1923  Last data filed at 06/04/2020 1440  Gross per 24 hour   Intake 1600 ml   Output 2100 ml   Net -500 ml     Filed Weights    05/31/20 1004   Weight: 136.1 kg     Examination:    Constitutional: NAD, AAOx3  HEENT: conjunctivae and lids normal, EOMI  CV: No cyanosis.    RESP: normal respiratory effort, on RA  Extremities: severe edema in LLE extending up to his thigh, with a layer of blister in addition to vesicles.  SKIN: warm, dry and intact  Neuro: II - XII grossly intact.    Psych: Normal mood and affect.      05/31/20    06/02/20    Data Reviewed: I have personally reviewed following labs and imaging studies    CBC:  Recent Labs   Lab 05/31/20  1014 06/01/20  0608 06/02/20  1100 06/03/20  0526 06/04/20  0502   WBC 33.3* 31.4* 31.0* 30.0* 27.6*   NEUTROABS 25.8*  --   --   --   --    HGB 14.6 13.0 13.2 12.3* 13.8   HCT 43.2 38.4* 39.3 36.8* 40.7   MCV 85.7 87.1 86.9 87.8 87.5   PLT 361 334 347 356 424*     Basic Metabolic Panel:  Recent Labs   Lab 05/31/20  1014 05/31/20  1014 05/31/20  1326 06/01/20  0608 06/02/20  0513 06/02/20  1100 06/03/20  0526 06/04/20  0502   NA 132*  --   --  131*  --  134* 136 132*   K 3.4*  --   --  3.6  --  4.2 4.4 3.9   CL 98  --   --  98  --  99 103 95*   CO2 22  --   --  23  --  25 24 25    GLUCOSE 129*  --   --  169*  --  145* 106* 113*   BUN 25*  --   --  20  --  15 14 15    CREATININE 1.49*   < >  --  1.21 1.22  1.03 1.10 1.30*   CALCIUM 8.3*  --   --  7.8*  --  8.0* 7.7* 7.9*   MG  --   --  2.5*  --   --  2.3 2.3 2.3    < > = values in this interval not displayed.     GFR:  Estimated Creatinine Clearance: 91.5 mL/min (A) (by C-G formula based on SCr of 1.3 mg/dL (H)).  Liver Function Tests:  Recent Labs   Lab  05/31/20  1014   AST 26   ALT 36   ALKPHOS 116   BILITOT 1.5*   PROT 8.1   ALBUMIN 3.0*     No results for input(s): LIPASE, AMYLASE in the last 168 hours.  No results for input(s): AMMONIA in the last 168 hours.  Coagulation Profile:  Recent Labs   Lab 05/31/20  1326   INR 1.3*     Cardiac Enzymes:  No results for input(s): CKTOTAL, CKMB, CKMBINDEX, TROPONINI in the last 168 hours.  BNP (last 3 results)  No results for input(s): PROBNP in the last 8760 hours.  HbA1C:  No results for input(s): HGBA1C in the last 72 hours.  CBG:  No results for input(s): GLUCAP in the last 168 hours.  Lipid Profile:  No results for input(s): CHOL, HDL, LDLCALC, TRIG, CHOLHDL, LDLDIRECT in the last 72 hours.  Thyroid Function Tests:  No results for input(s): TSH, T4TOTAL, FREET4, T3FREE, THYROIDAB in the last 72 hours.  Anemia Panel:  No results for input(s): VITAMINB12, FOLATE, FERRITIN, TIBC, IRON, RETICCTPCT in the last 72 hours.  Sepsis Labs:  Recent Labs   Lab 05/31/20  1014 05/31/20  1214 05/31/20  1326   PROCALCITON  --   --  4.22   LATICACIDVEN 1.7 1.5  --      Recent Results (from the past 240 hour(s))   Blood culture (routine x 2)     Status: None (Preliminary result)    Collection Time: 05/31/20 10:14 AM    Specimen: BLOOD   Result Value Ref Range Status    Specimen Description BLOOD LEFT ASSIST CONTROL  Final    Special Requests   Final     BOTTLES DRAWN AEROBIC AND ANAEROBIC Blood Culture results may not be optimal due to an excessive volume of blood received in culture bottles    Culture   Final     NO GROWTH 4 DAYS  Performed at [facility], [address] [place], [place] [address]     Report Status PENDING  Incomplete   Blood culture (routine x 2)     Status: None (Preliminary result)    Collection Time: 05/31/20  1:48 PM    Specimen: BLOOD   Result Value Ref Range Status    Specimen Description BLOOD BLOOD LEFT FOREARM  Final    Special Requests   Final     BOTTLES DRAWN AEROBIC AND ANAEROBIC Blood  Culture adequate volume    Culture   Final     NO GROWTH 4 DAYS  Performed at [facility], [address], [place], [place] [address]     Report Status PENDING  Incomplete   Respiratory Panel by RT PCR (Flu A&B, Covid) - Nasopharyngeal Swab     Status: None    Collection Time: 05/31/20  3:02 PM    Specimen: Nasopharyngeal Swab   Result Value Ref Range Status    SARS Coronavirus 2 by RT PCR NEGATIVE NEGATIVE Final  Comment: (NOTE)  SARS-CoV-2 target nucleic acids are NOT DETECTED.    The SARS-CoV-2 RNA is generally detectable in upper respiratoy  specimens during the acute phase of infection. The lowest  concentration of SARS-CoV-2 viral copies this assay can detect is  131 copies/mL. A negative result does not preclude SARS-Cov-2  infection and should not be used as the sole basis for treatment or  other patient management decisions. A negative result may occur with   improper specimen collection/handling, submission of specimen other  than nasopharyngeal swab, presence of viral mutation(s) within the  areas targeted by this assay, and inadequate number of viral copies  (<131 copies/mL). A negative result must be combined with clinical  observations, patient history, and epidemiological information. The  expected result is Negative.    Fact Sheet for Patients:   https://www.moore.com/    Fact Sheet for Healthcare Providers:   https://www.young.biz/    This test is no t yet approved or cleared by the [place] FDA and   has been authorized for detection and/or diagnosis of SARS-CoV-2 by  FDA under an Emergency Use Authorization (EUA). This EUA will remain   in effect (meaning this test can be used) for the duration of the  COVID-19 declaration under Section 564(b)(1) of the Act, 21 U.S.C.  section 360bbb-3(b)(1), unless the authorization is terminated or  revoked sooner.       Influenza A by PCR NEGATIVE NEGATIVE Final    Influenza B by PCR NEGATIVE NEGATIVE  Final     Comment: (NOTE)  The Xpert Xpress SARS-CoV-2/FLU/RSV assay is intended as an aid in   the diagnosis of influenza from Nasopharyngeal swab specimens and   should not be used as a sole basis for treatment. Nasal washings and   aspirates are unacceptable for Xpert Xpress SARS-CoV-2/FLU/RSV   testing.    Fact Sheet for Patients:  https://www.moore.com/    Fact Sheet for Healthcare Providers:  https://www.young.biz/    This test is not yet approved or cleared by the [place] FDA and   has been authorized for detection and/or diagnosis of SARS-CoV-2 by   FDA under an Emergency Use Authorization (EUA). This EUA will remain   in effect (meaning this test can be used) for the duration of the   Covid-19 declaration under Section 564(b)(1) of the Act, 21   U.S.C. section 360bbb-3(b)(1), unless the authorization is   terminated or revoked.  Performed at [facility], [address] [place],  [place] [address]        Radiology Studies:  No results found.    Scheduled Meds:  ? enoxaparin (LOVENOX) injection  0.5 mg/kg Subcutaneous Q24H   ? furosemide  40 mg Intravenous BID   ? hydrOXYzine  100 mg Oral QHS   ? lidocaine  1 patch Transdermal Q24H   ? senna-docusate  1 tablet Oral BID     Continuous Infusions:  ? cefTRIAXone (ROCEPHIN)  IV 2 g (06/04/20 4098)   ? linezolid (ZYVOX) IV 600 mg (06/04/20 1115)      LOS: 4 days     Darlin Priestly, MD  [facility]  Pager [phone]    If 7PM-7AM, please contact night-coverage  06/04/2020, 7:23 PM   Electronically signed by Darlin Priestly, MD at 06/05/2020  4:03 PM EDT

## 2020-06-04 NOTE — Unmapped (Signed)
Formatting of this note might be different from the original.    Problem: Education:  Goal: Knowledge of General Education information will improve  Description: Including pain rating scale, medication(s)/side effects and non-pharmacologic comfort measures  Outcome: Progressing    Problem: Health Behavior/Discharge Planning:  Goal: Ability to manage health-related needs will improve  Outcome: Progressing    Problem: Health Behavior/Discharge Planning:  Goal: Ability to manage health-related needs will improve  Outcome: Progressing    Problem: Clinical Measurements:  Goal: Will remain free from infection  Outcome: Progressing    Problem: Activity:  Goal: Risk for activity intolerance will decrease  Outcome: Progressing    Problem: Coping:  Goal: Level of anxiety will decrease  Outcome: Progressing    Electronically signed by Jean Rosenthal, RN at 06/04/2020  6:36 PM EDT

## 2020-06-05 LAB — CBC
HCT: 39.8 % (ref 39.0–52.0)
Hemoglobin: 13.3 g/dL (ref 13.0–17.0)
MCH: 29 pg (ref 26.0–34.0)
MCHC: 33.4 g/dL (ref 30.0–36.0)
MCV: 86.9 fL (ref 80.0–100.0)
Platelets: 455 10*3/uL — ABNORMAL HIGH (ref 150–400)
RBC: 4.58 MIL/uL (ref 4.22–5.81)
RDW: 15 % (ref 11.5–15.5)
WBC: 24.4 10*3/uL — ABNORMAL HIGH (ref 4.0–10.5)
nRBC: 0 % (ref 0.0–0.2)

## 2020-06-05 LAB — BASIC METABOLIC PANEL
Anion gap: 8 (ref 5–15)
BUN: 18 mg/dL (ref 6–20)
CO2: 25 mmol/L (ref 22–32)
Calcium: 7.8 mg/dL — ABNORMAL LOW (ref 8.9–10.3)
Chloride: 98 mmol/L (ref 98–111)
Creatinine, Ser: 1.32 mg/dL — ABNORMAL HIGH (ref 0.61–1.24)
GFR, Estimated: >60 mL/min (ref >60)
Glucose, Bld: 127 mg/dL — ABNORMAL HIGH (ref 70–99)
Potassium: 3.8 mmol/L (ref 3.5–5.1)
Sodium: 131 mmol/L — ABNORMAL LOW (ref 135–145)

## 2020-06-05 LAB — HEMOGLOBIN A1C
Hgb A1c MFr Bld: 6.3 % — ABNORMAL HIGH (ref 4.8–5.6)
Mean Plasma Glucose: 134.11 mg/dL

## 2020-06-05 LAB — CULTURE, BLOOD (ROUTINE X 2)
Culture: NO GROWTH
Culture: NO GROWTH
Special Requests: ADEQUATE

## 2020-06-05 LAB — MAGNESIUM: Magnesium: 2.2 mg/dL (ref 1.7–2.4)

## 2020-06-05 MED ORDER — OXYCODONE HCL 5 MG PO TABS
5.0000 mg | ORAL_TABLET | Freq: Four times a day (QID) | ORAL | Status: DC | PRN
  Administered 2020-06-05 – 2020-06-07 (×8): 5 mg via ORAL
  Filled 2020-06-05 (×8): qty 1

## 2020-06-05 MED ORDER — TRAMADOL HCL 50 MG PO TABS: 100.0000 mg | ORAL_TABLET | Freq: Four times a day (QID) | ORAL | Status: DC | PRN

## 2020-06-05 NOTE — Progress Notes (Signed)
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  Vascular surgery was consulted for possible incision and drainage.  An MRI was repeated today to identify any fluid collections that may be appropriately drained in the operating room.  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  Objective: Vitals:   06/04/20 0727 06/04/20 1602 06/05/20 0035 06/05/20 0750  BP: (!) 150/84 (!) 142/83 139/73 138/73  Pulse: 87 95 96 91  Resp: 18 15 15 18   Temp: 98.5 F (36.9 C) 99.4 F (37.4 C) 98.9 F (37.2 C) 98.7 F (37.1 C)  TempSrc: Oral Oral Oral   SpO2: 96% 97% 96% 99%  Weight:      Height:        Intake/Output Summary (Last 24 hours) at 06/05/2020 1313 Last data filed at 06/05/2020 1128 Gross per 24 hour  Intake --  Output 1950 ml  Net -1950 ml    Physical Exam: A&Ox3, NAD CV: RRR Pulmonary: CTA Bilaterally Abdomen: Soft, Nontender, Nondistended Vascular: Significant blistering and swelling of the left lower extremity with warmth and cellulitis.  At the bedside we debrided all the blisters to get down the wall healthy tissue.       Laboratory: CBC    Component Value Date/Time   WBC 24.4 (H) 06/05/2020 0431   HGB 13.3 06/05/2020 0431   HCT 39.8 06/05/2020 0431   PLT 455 (H) 06/05/2020 0431    BMET    Component Value Date/Time   NA 131 (L) 06/05/2020 0431   K 3.8 06/05/2020 0431   CL 98 06/05/2020 0431   CO2 25 06/05/2020 0431   GLUCOSE 127 (H) 06/05/2020 0431   BUN 18 06/05/2020 0431   CREATININE 1.32 (H) 06/05/2020 0431   CALCIUM 7.8 (L) 06/05/2020 0431   GFRNONAA >60 06/05/2020 0431    Assessment/Planning: 54 year old gentleman with left lower extremity venous insufficiency swelling  and blistering along with cellulitis.  MRI did not show any drainable fluid collections at this point time therefore conservative management is still indicated.  I did do some local debridement to allow 57 to get down to good healthy tissue.  It would be ideal to use a silver product however we do not have that available in this hospital.  We will try calcium alginate at this point leg elevation and continued antibiotics with conservative management.   [place], MD Vascular and Endovascular Surgery 06/05/2020 1:13 PM

## 2020-06-05 NOTE — Progress Notes (Signed)
PROGRESS NOTE    NAYIB REMER  ZOX:[id number] DOB: [birth year] DOA: 05/31/2020 PCP: Patient, No Pcp Per  Brief Narrative:  54 y.o. male with medical history significant of HTN, who presents with left leg pain, swelling, weeping.  Patient states that his left leg pain started approximately 5 days ago, which has been progressively worsening.  Left leg is erythematous, swelling, with significant blistering, open wound and weeping from several popped blisters. The pain is constant, sharp, moderate to severe, nonradiating. 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  Ultrasound on admission negative.  Patient presented with left leg complaints also complains of constipation neck pain.  Started on broad-spectrum IV antibiotics on admission.  Patient's main complaints are constipation and neck pain.  KUB negative for obstruction.  X-ray C-spine negative for fracture.    Assessment & Plan:   Principal Problem:   Left leg cellulitis Active Problems:   Hypertension   Sepsis (HCC)   Hypokalemia   AKI (acute kidney injury) (HCC)   Cellulitis of left leg  Severe Sepsis due to  left leg cellulitis:  Patient meets criteria for sepsis with leukocytosis with WBC 33.3 and tachycardia with heart rate 100 -->95.  with AKI. Lactate is normal, currently hemodynamically stable.  Left lower extremity venous Doppler is negative for DVT Marked leukocytosis Elevated inflammatory markers. --MR showed "Findings of diffuse cellulitis with superficial cyst. Non loculated fluid within the fascial layers. No evidence of osteomyelitis or abscess." --started on vanc/ceftriaxone, with IV clinda added later then d/c'ed again.  Vanc switched to Linezolid. --ID consulted, collected fluid from blister for culture. --repeat MRI LLE 10/29 ruled out fascitis and show no drainable fluid collection. Plan: --cont ceftriaxone and Linezolid, per ID --Vascular surgery performed local debridement at  bedside today --wound care and dressing per vascular surgery  # Severe LLE edema # possible lymphedema --LLE edema worsened with development of a layer of edema with multiple blisters, likely due to IVF given since presentation. 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  BP 130's-140's --IV hydralazine PRN  Hypokalemia: --replete PRN  AKI (acute kidney injury) (HCC) Creatinine 1.49, BUN 25, no baseline creatinine available,  may be due to dehydration versus sepsis. --Cr improved to 1.03 after IVF and tx of cellulitis Plan: --Monitor Cr while diuresing  Cervicalgia Patient is a truck driver Exam significant for rigid lump in posterior neck Noted to soft tissue on x-ray C-spine x-ray negative for fracture Plan: --cont lidocaine patch  Morbid obesity BMI 41.8 This complicates care and prognosis  Hyperglycemia --No hx of DM2 --obtain A1c   DVT prophylaxis: Lovenox Code Status: Full Family Communication:   Status is: Inpatient   Dispo: The patient is from: Home              Anticipated d/c is to: Home              Anticipated d/c date is: undetermined              Patient currently is not medically stable to d/c.  Severe sepsis 2/2 cellulitis, currently on IV abx, not improving yet. Pending ID and vascular surgery consult.  Consultants:   None  Procedures:   None  Antimicrobials:   Vancomycin  Ceftriaxone  Clindamycin   Subjective: Repeat MRI didn't show any drainable fluid collections.  Vascular surgery did local debridement today.  Pt complained of more pain in his leg after  debridement.   Objective: Vitals:   06/04/20 1602 06/05/20 0035 06/05/20 0750 06/05/20 1536  BP: (!) 142/83 139/73 138/73 (!) 148/82  Pulse: 95 96 91 100  Resp: 15 15 18 16   Temp: 99.4 F (37.4 C) 98.9 F (37.2 C) 98.7 F (37.1 C) 100.1 F (37.8 C)  TempSrc: Oral Oral  Oral  SpO2: 97% 96% 99% 99%  Weight:      Height:        Intake/Output Summary (Last 24 hours) at 06/05/2020 1717 Last data filed at 06/05/2020 1128 Gross per 24 hour  Intake --  Output 1550 ml  Net -1550 ml   Filed Weights   05/31/20 1004  Weight: 136.1 kg    Examination:  Constitutional: NAD, AAOx3 HEENT: conjunctivae and lids normal, EOMI CV: No cyanosis.   RESP: normal respiratory effort, on RA Extremities: severe edema in LLE extending up to his thigh, with a layer of blister in addition to vesicles. SKIN: warm, dry and intact Neuro: II - XII grossly intact.   Psych: Normal mood and affect.     05/31/20    06/02/20    06/05/2020, post local debridement     Data Reviewed: I have personally reviewed following labs and imaging studies  CBC: Recent Labs  Lab 05/31/20 1014 05/31/20 1014 06/01/20 0608 06/02/20 1100 06/03/20 0526 06/04/20 0502 06/05/20 0431  WBC 33.3*   < > 31.4* 31.0* 30.0* 27.6* 24.4*  NEUTROABS 25.8*  --   --   --   --   --   --   HGB 14.6   < > 13.0 13.2 12.3* 13.8 13.3  HCT 43.2   < > 38.4* 39.3 36.8* 40.7 39.8  MCV 85.7   < > 87.1 86.9 87.8 87.5 86.9  PLT 361   < > 334 347 356 424* 455*   < > = values in this interval not displayed.   Basic Metabolic Panel: Recent Labs  Lab 05/31/20 1014 05/31/20 1326 06/01/20 0608 06/01/20 0608 06/02/20 0513 06/02/20 1100 06/03/20 0526 06/04/20 0502 06/05/20 0431  NA   < >  --  131*  --   --  134* 136 132* 131*  K   < >  --  3.6  --   --  4.2 4.4 3.9 3.8  CL   < >  --  98  --   --  99 103 95* 98  CO2   < >  --  23  --   --  25 24 25 25   GLUCOSE   < >  --  169*  --   --  145* 106* 113* 127*  BUN   < >  --   20  --   --  15 14 15 18   CREATININE   < >  --  1.21   < > 1.22 1.03 1.10 1.30* 1.32*  CALCIUM   < >  --  7.8*  --   --  8.0* 7.7* 7.9* 7.8*  MG  --  2.5*  --   --   --  2.3 2.3 2.3 2.2   < > = values in this interval not displayed.   GFR: Estimated Creatinine Clearance: 90.1 mL/min (A) (by  C-G formula based on SCr of 1.32 mg/dL (H)). Liver Function Tests: Recent Labs  Lab 05/31/20 1014  AST 26  ALT 36  ALKPHOS 116  BILITOT 1.5*  PROT 8.1  ALBUMIN 3.0*   No results for input(s): LIPASE, AMYLASE in the last 168 hours. No results for input(s): AMMONIA in the last 168 hours. Coagulation Profile: Recent Labs  Lab 05/31/20 1326  INR 1.3*   Cardiac Enzymes: No results for input(s): CKTOTAL, CKMB, CKMBINDEX, TROPONINI in the last 168 hours. BNP (last 3 results) No results for input(s): PROBNP in the last 8760 hours. HbA1C: No results for input(s): HGBA1C in the last 72 hours. CBG: No results for input(s): GLUCAP in the last 168 hours. Lipid Profile: No results for input(s): CHOL, HDL, LDLCALC, TRIG, CHOLHDL, LDLDIRECT in the last 72 hours. Thyroid Function Tests: No results for input(s): TSH, T4TOTAL, FREET4, T3FREE, THYROIDAB in the last 72 hours. Anemia Panel: No results for input(s): VITAMINB12, FOLATE, FERRITIN, TIBC, IRON, RETICCTPCT in the last 72 hours. Sepsis Labs: Recent Labs  Lab 05/31/20 1014 05/31/20 1214 05/31/20 1326  PROCALCITON  --   --  4.22  LATICACIDVEN 1.7 1.5  --     Recent Results (from the past 240 hour(s))  Blood culture (routine x 2)     Status: None   Collection Time: 05/31/20 10:14 AM   Specimen: BLOOD  Result Value Ref Range Status   Specimen Description BLOOD LEFT ASSIST CONTROL  Final   Special Requests   Final    BOTTLES DRAWN AEROBIC AND ANAEROBIC Blood Culture results may not be optimal due to an excessive volume of blood received in culture bottles   Culture   Final    NO GROWTH 5 DAYS Performed at [facility], [address] [place], [place] [address]    Report Status 06/05/2020 FINAL  Final  Blood culture (routine x 2)     Status: None   Collection Time: 05/31/20  1:48 PM   Specimen: BLOOD  Result Value Ref Range Status   Specimen Description BLOOD BLOOD LEFT FOREARM  Final   Special Requests   Final    BOTTLES DRAWN AEROBIC AND ANAEROBIC Blood Culture adequate volume   Culture   Final    NO GROWTH 5 DAYS Performed at [facility], [address] [place], [place] [address]    Report Status 06/05/2020 FINAL  Final  Respiratory Panel by RT PCR (Flu A&B, Covid) - Nasopharyngeal Swab     Status: None   Collection Time: 05/31/20  3:02 PM   Specimen: Nasopharyngeal Swab  Result Value Ref Range Status   SARS Coronavirus 2 by RT PCR NEGATIVE NEGATIVE Final    Comment: (NOTE) SARS-CoV-2 target nucleic acids are NOT DETECTED.  The SARS-CoV-2 RNA is generally detectable in upper respiratoy specimens during the acute phase of infection. The lowest concentration of SARS-CoV-2 viral copies this assay can detect is 131 copies/mL. A negative result does not preclude SARS-Cov-2 infection and should not be used as the sole basis for treatment or other patient management decisions. A negative result may occur with  improper specimen collection/handling, submission of specimen other than nasopharyngeal swab, presence of viral mutation(s) within the areas targeted by this assay, and inadequate number of viral copies (<131 copies/mL). A negative result must be combined with clinical observations, patient history, and epidemiological information. The expected result is Negative.  Fact Sheet for Patients:  https://www.moore.com/  Fact Sheet for Healthcare Providers:  https://www.young.biz/  This test  is no t yet approved or cleared by the [place] and  has been authorized for detection and/or diagnosis of SARS-CoV-2 by FDA under an Emergency Use  Authorization (EUA). This EUA will remain  in effect (meaning this test can be used) for the duration of the COVID-19 declaration under Section 564(b)(1) of the Act, 21 U.S.C. section 360bbb-3(b)(1), unless the authorization is terminated or revoked sooner.     Influenza A by PCR NEGATIVE NEGATIVE Final   Influenza B by PCR NEGATIVE NEGATIVE Final    Comment: (NOTE) The Xpert Xpress SARS-CoV-2/FLU/RSV assay is intended as an aid in  the diagnosis of influenza from Nasopharyngeal swab specimens and  should not be used as a sole basis for treatment. Nasal washings and  aspirates are unacceptable for Xpert Xpress SARS-CoV-2/FLU/RSV  testing.  Fact Sheet for Patients: https://www.moore.com/  Fact Sheet for Healthcare Providers: https://www.young.biz/  This test is not yet approved or cleared by the [place] FDA and  has been authorized for detection and/or diagnosis of SARS-CoV-2 by  FDA under an Emergency Use Authorization (EUA). This EUA will remain  in effect (meaning this test can be used) for the duration of the  Covid-19 declaration under Section 564(b)(1) of the Act, 21  U.S.C. section 360bbb-3(b)(1), unless the authorization is  terminated or revoked. Performed at [facility], [address] [place], [place] [address]   Aerobic Culture (superficial specimen)     Status: None (Preliminary result)   Collection Time: 06/04/20 10:53 PM   Specimen: Leg; Wound  Result Value Ref Range Status   Specimen Description   Final    LEG LEFT Performed at [facility], [address], [place], [place] [address]    Special Requests   Final    NONE Performed at [facility], [address] [place], [place] [address]    Gram Stain   Final    RARE WBC PRESENT, PREDOMINANTLY PMN NO ORGANISMS SEEN Performed at [facility], [address] [place], [place] [address]    Culture PENDING  Incomplete    Report Status PENDING  Incomplete         Radiology Studies: MR TIBIA FIBULA LEFT W WO CONTRAST  Result Date: 06/05/2020 CLINICAL DATA:  Left lower extremity pain and swelling. EXAM: MRI OF LOWER LEFT EXTREMITY WITHOUT AND WITH CONTRAST TECHNIQUE: Multiplanar, multisequence MR imaging of the left lower extremity was performed both before and after administration of intravenous contrast. CONTRAST:  17mL GADAVIST GADOBUTROL 1 MMOL/ML IV SOLN COMPARISON:  06/01/2020 FINDINGS: Examination is limited by patient motion. Diffuse and marked subcutaneous soft tissue swelling/edema/fluid similar to prior study. There are numerous skin lesions, likely blisters. In the upper and mid mid tibia regions anteriorly and medially there are subcutaneous fluid collections which are ill-defined and not well organized. These could be a developing abscesses. No well-formed rim enhancing drainable abscess is identified. No MR findings for myofasciitis or pyomyositis. No evidence of osteomyelitis. No findings suspicious for septic arthritis. IMPRESSION: 1. Persistent diffuse and marked subcutaneous soft tissue swelling/edema/fluid similar to prior study. There are numerous skin lesions, likely blisters. 2. In the upper and mid tibia regions anteriorly and medially there are ill-defined subcutaneous fluid collections which are not well organized. These could be a developing abscesses. No well-formed rim enhancing drainable abscess is identified. 3. No MR findings for myofasciitis or pyomyositis. 4. No findings suspicious for septic arthritis or osteomyelitis. Electronically Signed   By: Orlene Plum.D.  On: 06/05/2020 08:39        Scheduled Meds:  enoxaparin (LOVENOX) injection  0.5 mg/kg Subcutaneous Q24H   furosemide  40 mg Intravenous BID   hydrOXYzine  100 mg Oral QHS   lidocaine  1 patch Transdermal Q24H   senna-docusate  1 tablet Oral BID   Continuous Infusions:  cefTRIAXone (ROCEPHIN)  IV 2 g  (06/05/20 1424)   linezolid (ZYVOX) IV 600 mg (06/04/20 2244)     LOS: 5 days   Darlin Priestly, MD [facility] Pager [phone]  If 7PM-7AM, please contact night-coverage 06/05/2020, 5:17 PM

## 2020-06-05 NOTE — Progress Notes (Addendum)
End of Shift:  Gave report to [employer], Charity fundraiser. Patient in bed and asleep. No signs of distress. Call bell in reach. LLE elevated on 4 pillows.

## 2020-06-05 NOTE — Progress Notes (Signed)
Formatting of this note is different from the original.  Images from the original note were not included.  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  Vascular surgery was consulted for possible incision and drainage.  An MRI was repeated today to identify any fluid collections that may be appropriately drained in the operating room.    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    Objective:  Vitals:    06/04/20 0727 06/04/20 1602 06/05/20 0035 06/05/20 0750   BP: (!) 150/84 (!) 142/83 139/73 138/73   Pulse: 87 95 96 91   Resp: 18 15 15 18    Temp: 98.5 F (36.9 C) 99.4 F (37.4 C) 98.9 F (37.2 C) 98.7 F (37.1 C)   TempSrc: Oral Oral Oral    SpO2: 96% 97% 96% 99%   Weight:       Height:         Intake/Output Summary (Last 24 hours) at 06/05/2020 1313  Last data filed at 06/05/2020 1128  Gross per 24 hour   Intake --   Output 1950 ml   Net -1950 ml     Physical Exam:  A&Ox3, NAD  CV: RRR  Pulmonary: CTA Bilaterally  Abdomen: Soft, Nontender, Nondistended  Vascular: Significant blistering and swelling of the left lower extremity with warmth and cellulitis.  At the bedside we debrided all the blisters to get down the wall healthy tissue.      Laboratory:  CBC    Component Value Date/Time    WBC 24.4 (H) 06/05/2020 0431    HGB 13.3 06/05/2020 0431    HCT 39.8 06/05/2020 0431    PLT 455 (H) 06/05/2020 0431     BMET    Component Value Date/Time    NA 131 (L) 06/05/2020 0431    K 3.8 06/05/2020 0431    CL 98 06/05/2020 0431    CO2 25 06/05/2020 0431    GLUCOSE 127 (H) 06/05/2020 0431    BUN 18 06/05/2020 0431    CREATININE 1.32 (H) 06/05/2020 0431    CALCIUM 7.8 (L) 06/05/2020 0431    GFRNONAA >60 06/05/2020 0431      Assessment/Planning: 54 year old gentleman with left lower extremity venous insufficiency swelling and blistering along with cellulitis.  MRI did not show any drainable fluid collections at this point time therefore conservative management is still indicated.  I did do some local debridement to allow [place] to get down to good healthy tissue.  It would be ideal to use a silver product however we do not have that available in this hospital.  We will try calcium alginate at this point leg elevation and continued antibiotics with conservative management.    Boneta Lucks, MD  Vascular and Endovascular Surgery  06/05/2020  1:13 PM    Electronically signed by Cathleen Fears, MD at 06/05/2020  1:15 PM EDT

## 2020-06-05 NOTE — Progress Notes (Signed)
Formatting of this note might be different from the original.  End of Shift:    Gave report to [place], Charity fundraiser. Patient in bed and asleep. No signs of distress. Call bell in reach. LLE elevated on 4 pillows.   Electronically signed by Inez Pilgrim, RN at 06/05/2020  7:22 AM EDT

## 2020-06-05 NOTE — Progress Notes (Signed)
Formatting of this note is different from the original.  Images from the original note were not included.    PROGRESS NOTE    SHALEV RIDENBAUGH  ZOX:[id number] DOB: 05/11/1966 DOA: 05/31/2020  PCP: Patient, No Pcp Per   Brief Narrative:   54 y.o. male with medical history significant of HTN, who presents with left leg pain, swelling, weeping.    Patient states that his left leg pain started approximately 5 days ago, which has been progressively worsening.  Left leg is erythematous, swelling, with significant blistering, open wound and weeping from several popped blisters. The pain is constant, sharp, moderate to severe, nonradiating. 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  Ultrasound on admission negative.    Patient presented with left leg complaints also complains of constipation neck pain.  Started on broad-spectrum IV antibiotics on admission.    Patient's main complaints are constipation and neck pain.  KUB negative for obstruction.  X-ray C-spine negative for fracture.    Assessment & Plan:    Principal Problem:    Left leg cellulitis  Active Problems:    Hypertension    Sepsis (HCC)    Hypokalemia    AKI (acute kidney injury) (HCC)    Cellulitis of left leg    Severe Sepsis due to   left leg cellulitis:   Patient meets criteria for sepsis with leukocytosis with WBC 33.3 and tachycardia with heart rate 100 -->95.  with AKI.  Lactate is normal, currently hemodynamically stable.   Left lower extremity venous Doppler is negative for DVT  Marked leukocytosis  Elevated inflammatory markers.  --MR showed "Findings of diffuse cellulitis with superficial cyst. Non loculated fluid within the fascial layers. No evidence of osteomyelitis or abscess."  --started on vanc/ceftriaxone, with IV clinda added later then d/c'ed again.  Vanc switched to Linezolid.  --ID consulted, collected fluid from blister for culture.  --repeat MRI LLE 10/29 ruled out fascitis and show no drainable fluid  collection.  Plan:  --cont ceftriaxone and Linezolid, per ID  --Vascular surgery performed local debridement at bedside today  --wound care and dressing per vascular surgery    # Severe LLE edema  # possible lymphedema  --LLE edema worsened with development of a layer of edema with multiple blisters, likely due to IVF given since presentation.  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  BP 130's-140's  --IV hydralazine PRN    Hypokalemia:  --replete PRN    AKI (acute kidney injury) (HCC)  Creatinine 1.49, BUN 25, no baseline creatinine available,   may be due to dehydration versus sepsis.  --Cr improved to 1.03 after IVF and tx of cellulitis  Plan:  --Monitor Cr while diuresing    Cervicalgia  Patient is a truck driver  Exam significant for rigid lump in posterior neck  Noted to soft tissue on x-ray  C-spine x-ray negative for fracture  Plan:  --cont lidocaine patch    Morbid obesity  BMI 41.8  This complicates care and prognosis    Hyperglycemia  --No hx of DM2  --obtain A1c  DVT prophylaxis: Lovenox  Code Status: Full  Family Communication:    Status is: Inpatient    Dispo: The patient is from: Home               Anticipated d/c is to: Home               Anticipated d/c date is: undetermined               Patient currently is not medically stable to d/c.  Severe sepsis 2/2 cellulitis, currently on IV abx, not improving yet. Pending ID and vascular surgery consult.    Consultants:    None    Procedures:    None    Antimicrobials:    Vancomycin   Ceftriaxone   Clindamycin    Subjective:  Repeat MRI didn't show any drainable fluid  collections.  Vascular surgery did local debridement today.  Pt complained of more pain in his leg after debridement.    Objective:  Vitals:    06/04/20 1602 06/05/20 0035 06/05/20 0750 06/05/20 1536   BP: (!) 142/83 139/73 138/73 (!) 148/82   Pulse: 95 96 91 100   Resp: 15 15 18 16    Temp: 99.4 F (37.4 C) 98.9 F (37.2 C) 98.7 F (37.1 C) 100.1 F (37.8 C)   TempSrc: Oral Oral  Oral   SpO2: 97% 96% 99% 99%   Weight:       Height:         Intake/Output Summary (Last 24 hours) at 06/05/2020 1717  Last data filed at 06/05/2020 1128  Gross per 24 hour   Intake --   Output 1550 ml   Net -1550 ml     Filed Weights    05/31/20 1004   Weight: 136.1 kg     Examination:    Constitutional: NAD, AAOx3  HEENT: conjunctivae and lids normal, EOMI  CV: No cyanosis.    RESP: normal respiratory effort, on RA  Extremities: severe edema in LLE extending up to his thigh, with a layer of blister in addition to vesicles.  SKIN: warm, dry and intact  Neuro: II - XII grossly intact.    Psych: Normal mood and affect.      05/31/20    06/02/20    06/05/2020, post local debridement    Data Reviewed: I have personally reviewed following labs and imaging studies    CBC:  Recent Labs   Lab 05/31/20  1014 05/31/20  1014 06/01/20  0608 06/02/20  1100 06/03/20  0526 06/04/20  0502 06/05/20  0431   WBC 33.3*   < > 31.4* 31.0* 30.0* 27.6* 24.4*   NEUTROABS 25.8*  --   --   --   --   --   --    HGB 14.6   < > 13.0 13.2 12.3* 13.8 13.3   HCT 43.2   < > 38.4* 39.3 36.8* 40.7 39.8   MCV 85.7   < > 87.1 86.9 87.8 87.5 86.9   PLT 361   < > 334 347 356 424* 455*    < > = values in this interval not displayed.     Basic Metabolic Panel:  Recent Labs   Lab 05/31/20  1014 05/31/20  1326 06/01/20  0608 06/01/20  0608 06/02/20  0513 06/02/20  1100 06/03/20  0526 06/04/20  0502 06/05/20  0431   NA   < >  --  131*  --   --  134* 136 132* 131*  K   < >  --  3.6  --   --  4.2 4.4 3.9 3.8   CL   < >  --  98  --   --  99 103 95* 98   CO2   < >  --  23  --   --   25 24 25 25    GLUCOSE   < >  --  169*  --   --  145* 106* 113* 127*   BUN   < >  --  20  --   --  15 14 15 18    CREATININE   < >  --  1.21   < > 1.22 1.03 1.10 1.30* 1.32*   CALCIUM   < >  --  7.8*  --   --  8.0* 7.7* 7.9* 7.8*   MG  --  2.5*  --   --   --  2.3 2.3 2.3 2.2    < > = values in this interval not displayed.     GFR:  Estimated Creatinine Clearance: 90.1 mL/min (A) (by C-G formula based on SCr of 1.32 mg/dL (H)).  Liver Function Tests:  Recent Labs   Lab 05/31/20  1014   AST 26   ALT 36   ALKPHOS 116   BILITOT 1.5*   PROT 8.1   ALBUMIN 3.0*     No results for input(s): LIPASE, AMYLASE in the last 168 hours.  No results for input(s): AMMONIA in the last 168 hours.  Coagulation Profile:  Recent Labs   Lab 05/31/20  1326   INR 1.3*     Cardiac Enzymes:  No results for input(s): CKTOTAL, CKMB, CKMBINDEX, TROPONINI in the last 168 hours.  BNP (last 3 results)  No results for input(s): PROBNP in the last 8760 hours.  HbA1C:  No results for input(s): HGBA1C in the last 72 hours.  CBG:  No results for input(s): GLUCAP in the last 168 hours.  Lipid Profile:  No results for input(s): CHOL, HDL, LDLCALC, TRIG, CHOLHDL, LDLDIRECT in the last 72 hours.  Thyroid Function Tests:  No results for input(s): TSH, T4TOTAL, FREET4, T3FREE, THYROIDAB in the last 72 hours.  Anemia Panel:  No results for input(s): VITAMINB12, FOLATE, FERRITIN, TIBC, IRON, RETICCTPCT in the last 72 hours.  Sepsis Labs:  Recent Labs   Lab 05/31/20  1014 05/31/20  1214 05/31/20  1326   PROCALCITON  --   --  4.22   LATICACIDVEN 1.7 1.5  --      Recent Results (from the past 240 hour(s))   Blood culture (routine x 2)     Status: None    Collection Time: 05/31/20 10:14 AM    Specimen: BLOOD   Result Value Ref Range Status    Specimen Description BLOOD LEFT ASSIST CONTROL  Final    Special Requests   Final     BOTTLES DRAWN AEROBIC AND ANAEROBIC Blood Culture results may not be optimal due to an excessive volume of blood received in culture bottles     Culture   Final     NO GROWTH 5 DAYS  Performed at [facility], [address] [place], [place] [address]     Report Status 06/05/2020 FINAL  Final   Blood culture (routine x 2)     Status: None    Collection Time: 05/31/20  1:48 PM    Specimen: BLOOD   Result Value Ref Range Status    Specimen  Description BLOOD BLOOD LEFT FOREARM  Final    Special Requests   Final     BOTTLES DRAWN AEROBIC AND ANAEROBIC Blood Culture adequate volume    Culture   Final     NO GROWTH 5 DAYS  Performed at [facility], [address] [place], [place] [address]     Report Status 06/05/2020 FINAL  Final   Respiratory Panel by RT PCR (Flu A&B, Covid) - Nasopharyngeal Swab     Status: None    Collection Time: 05/31/20  3:02 PM    Specimen: Nasopharyngeal Swab   Result Value Ref Range Status    SARS Coronavirus 2 by RT PCR NEGATIVE NEGATIVE Final     Comment: (NOTE)  SARS-CoV-2 target nucleic acids are NOT DETECTED.    The SARS-CoV-2 RNA is generally detectable in upper respiratoy  specimens during the acute phase of infection. The lowest  concentration of SARS-CoV-2 viral copies this assay can detect is  131 copies/mL. A negative result does not preclude SARS-Cov-2  infection and should not be used as the sole basis for treatment or  other patient management decisions. A negative result may occur with   improper specimen collection/handling, submission of specimen other  than nasopharyngeal swab, presence of viral mutation(s) within the  areas targeted by this assay, and inadequate number of viral copies  (<131 copies/mL). A negative result must be combined with clinical  observations, patient history, and epidemiological information. The  expected result is Negative.    Fact Sheet for Patients:   https://www.moore.com/    Fact Sheet for Healthcare Providers:   https://www.young.biz/    This test is no t yet approved or cleared by the [place] FDA and   has been authorized  for detection and/or diagnosis of SARS-CoV-2 by  FDA under an Emergency Use Authorization (EUA). This EUA will remain   in effect (meaning this test can be used) for the duration of the  COVID-19 declaration under Section 564(b)(1) of the Act, 21 U.S.C.  section 360bbb-3(b)(1), unless the authorization is terminated or  revoked sooner.       Influenza A by PCR NEGATIVE NEGATIVE Final    Influenza B by PCR NEGATIVE NEGATIVE Final     Comment: (NOTE)  The Xpert Xpress SARS-CoV-2/FLU/RSV assay is intended as an aid in   the diagnosis of influenza from Nasopharyngeal swab specimens and   should not be used as a sole basis for treatment. Nasal washings and   aspirates are unacceptable for Xpert Xpress SARS-CoV-2/FLU/RSV   testing.    Fact Sheet for Patients:  https://www.moore.com/    Fact Sheet for Healthcare Providers:  https://www.young.biz/    This test is not yet approved or cleared by the [place] FDA and   has been authorized for detection and/or diagnosis of SARS-CoV-2 by   FDA under an Emergency Use Authorization (EUA). This EUA will remain   in effect (meaning this test can be used) for the duration of the   Covid-19 declaration under Section 564(b)(1) of the Act, 21   U.S.C. section 360bbb-3(b)(1), unless the authorization is   terminated or revoked.  Performed at [facility], [address] [place],  [place] [address]    Aerobic Culture (superficial specimen)     Status: None (Preliminary result)    Collection Time: 06/04/20 10:53 PM    Specimen: Leg; Wound   Result Value Ref Range Status    Specimen Description   Final  LEG LEFT  Performed at [facility], [address], [place], [place] [address]     Special Requests   Final     NONE  Performed at [facility], [address] [place], [place] [address]     Gram Stain   Final     RARE WBC PRESENT, PREDOMINANTLY PMN  NO ORGANISMS SEEN  Performed at [facility], [address] [place], [place] [address]     Culture PENDING  Incomplete    Report Status PENDING  Incomplete       Radiology Studies:  MR TIBIA FIBULA LEFT W WO CONTRAST    Result Date: 06/05/2020  CLINICAL DATA:  Left lower extremity pain and swelling. EXAM: MRI OF LOWER LEFT EXTREMITY WITHOUT AND WITH CONTRAST TECHNIQUE: Multiplanar, multisequence MR imaging of the left lower extremity was performed both before and after administration of intravenous contrast. CONTRAST:  10mL GADAVIST GADOBUTROL 1 MMOL/ML IV SOLN COMPARISON:  06/01/2020 FINDINGS: Examination is limited by patient motion. Diffuse and marked subcutaneous soft tissue swelling/edema/fluid similar to prior study. There are numerous skin lesions, likely blisters. In the upper and mid mid tibia regions anteriorly and medially there are subcutaneous fluid collections which are ill-defined and not well organized. These could be a developing abscesses. No well-formed rim enhancing drainable abscess is identified. No MR findings for myofasciitis or pyomyositis. No evidence of osteomyelitis. No findings suspicious for septic arthritis. IMPRESSION: 1. Persistent diffuse and marked subcutaneous soft tissue swelling/edema/fluid similar to prior study. There are numerous skin lesions, likely blisters. 2. In the upper and mid tibia regions anteriorly and medially there are ill-defined subcutaneous fluid collections which are not well organized. These could be a developing abscesses. No well-formed rim enhancing drainable abscess is identified. 3. No MR findings for myofasciitis or pyomyositis. 4. No findings suspicious for septic arthritis or osteomyelitis. Electronically Signed   By: Rudie Meyer M.D.   On: 06/05/2020 08:39     Scheduled Meds:  ? enoxaparin (LOVENOX) injection  0.5 mg/kg Subcutaneous Q24H   ? furosemide  40 mg Intravenous BID   ? hydrOXYzine  100 mg Oral QHS   ? lidocaine  1 patch Transdermal Q24H   ? senna-docusate  1 tablet Oral BID     Continuous  Infusions:  ? cefTRIAXone (ROCEPHIN)  IV 2 g (06/05/20 1424)   ? linezolid (ZYVOX) IV 600 mg (06/04/20 2244)      LOS: 5 days     Darlin Priestly, MD  [facility]  Pager [phone]    If 7PM-7AM, please contact night-coverage  06/05/2020, 5:17 PM   Electronically signed by Darlin Priestly, MD at 06/05/2020  7:37 PM EDT

## 2020-06-06 LAB — BASIC METABOLIC PANEL
Anion gap: 9 (ref 5–15)
BUN: 18 mg/dL (ref 6–20)
CO2: 27 mmol/L (ref 22–32)
Calcium: 8 mg/dL — ABNORMAL LOW (ref 8.9–10.3)
Chloride: 98 mmol/L (ref 98–111)
Creatinine, Ser: 1.4 mg/dL — ABNORMAL HIGH (ref 0.61–1.24)
GFR, Estimated: 60 mL/min — ABNORMAL LOW (ref >60)
Glucose, Bld: 141 mg/dL — ABNORMAL HIGH (ref 70–99)
Potassium: 4.1 mmol/L (ref 3.5–5.1)
Sodium: 134 mmol/L — ABNORMAL LOW (ref 135–145)

## 2020-06-06 LAB — CBC
HCT: 41.2 % (ref 39.0–52.0)
Hemoglobin: 13.5 g/dL (ref 13.0–17.0)
MCH: 28.7 pg (ref 26.0–34.0)
MCHC: 32.8 g/dL (ref 30.0–36.0)
MCV: 87.7 fL (ref 80.0–100.0)
Platelets: 508 10*3/uL — ABNORMAL HIGH (ref 150–400)
RBC: 4.7 MIL/uL (ref 4.22–5.81)
RDW: 14.7 % (ref 11.5–15.5)
WBC: 20.6 10*3/uL — ABNORMAL HIGH (ref 4.0–10.5)
nRBC: 0 % (ref 0.0–0.2)

## 2020-06-06 LAB — MAGNESIUM: Magnesium: 2.2 mg/dL (ref 1.7–2.4)

## 2020-06-06 NOTE — Progress Notes (Signed)
[facility]  Daily Progress Note   s/p local left lower extremity debridements at the bedside  Subjective: 54 year old gentleman with venous insufficiency left lower extremity with cellulitis and large blisters.  We did a superficial debridement yesterday and placed calcium alginate.  This will help absorb some of the drainage.   Patient states his left leg feels better.  Swelling is improved.  Objective: Vitals:   06/05/20 0750 06/05/20 1536 06/05/20 2341 06/06/20 0724  BP: 138/73 (!) 148/82 130/70 (!) 118/100  Pulse: 91 100 90 88  Resp: 18 16 18 18   Temp: 98.7 F (37.1 C) 100.1 F (37.8 C) 99.7 F (37.6 C) 98.6 F (37 C)  TempSrc:  Oral Oral   SpO2: 99% 99% 97% 95%  Weight:      Height:        Intake/Output Summary (Last 24 hours) at 06/06/2020 1149 Last data filed at 06/06/2020 1034 Gross per 24 hour  Intake 1177.2 ml  Output 3000 ml  Net -1822.8 ml    Physical Exam: A&Ox3, NAD CV: RRR Pulmonary: CTA Bilaterally Abdomen: Soft, Nontender, Nondistended Vascular: Dressings are intact.   Laboratory: CBC    Component Value Date/Time   WBC 20.6 (H) 06/06/2020 0332   HGB 13.5 06/06/2020 0332   HCT 41.2 06/06/2020 0332   PLT 508 (H) 06/06/2020 0332    BMET    Component Value Date/Time   NA 134 (L) 06/06/2020 0332   K 4.1 06/06/2020 0332   CL 98 06/06/2020 0332   CO2 27 06/06/2020 0332   GLUCOSE 141 (H) 06/06/2020 0332   BUN 18 06/06/2020 0332   CREATININE 1.40 (H) 06/06/2020 0332   CALCIUM 8.0 (L) 06/06/2020 0332   GFRNONAA 60 (L) 06/06/2020 06/08/2020    Assessment/Planning: 54 year old gentleman with venous insufficiency and ulceration.  No vascular surgery intervention is required at this point time.  We would recommend continued with local wound care and referral to the wound care center.  He can follow-up with vascular surgery as needed.  57, MD Vascular and Endovascular Surgery 06/06/2020 11:49 AM

## 2020-06-06 NOTE — Plan of Care (Signed)
  Problem: Education: Goal: Knowledge of General Education information will improve Description Including pain rating scale, medication(s)/side effects and non-pharmacologic comfort measures Outcome: Progressing   Problem: Health Behavior/Discharge Planning: Goal: Ability to manage health-related needs will improve Outcome: Progressing   

## 2020-06-06 NOTE — Progress Notes (Signed)
PROGRESS NOTE    Stephen BachRodney Q [name]  ZOX:[id number] DOB: Feb 06, 1966 DOA: 05/31/2020 PCP: Stephen [name], No Pcp Per  Brief Narrative:  54 y.o. male with medical history significant of HTN, who presents with left leg pain, swelling, weeping.  Stephen [name] states that his left leg pain started approximately 5 days ago, which has been progressively worsening.  Left leg is erythematous, swelling, with significant blistering, open wound and weeping from several popped blisters. The pain is constant, sharp, moderate to severe, nonradiating. 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  Ultrasound on admission negative.  Stephen [name] presented with left leg complaints also complains of constipation neck pain.  Started on broad-spectrum IV antibiotics on admission.  Stephen [name]'s main complaints are constipation and neck pain.  KUB negative for obstruction.  X-ray C-spine negative for fracture.    Assessment & Plan:   Principal Problem:   Left leg cellulitis Active Problems:   Hypertension   Sepsis (HCC)   Hypokalemia   AKI (acute kidney injury) (HCC)   Cellulitis of left leg  Severe Sepsis due to  left leg cellulitis:  Stephen [name] meets criteria for sepsis with leukocytosis with WBC 33.3 and tachycardia with heart rate 100 -->95.  with AKI. Lactate is normal, currently hemodynamically stable.  Left lower extremity venous Doppler is negative for DVT Marked leukocytosis Elevated inflammatory markers. --MR showed "Findings of diffuse cellulitis with superficial cyst. Non loculated fluid within the fascial layers. No evidence of osteomyelitis or abscess." --started on vanc/ceftriaxone, with IV clinda added later then d/c'ed again.  Vanc switched to Linezolid. --ID consulted, collected fluid from blister for culture. --repeat MRI LLE 10/29 ruled out fascitis and show no drainable fluid collection. --Vascular surgery performed local debridement at bedside 10/30 Plan: --cont ceftriaxone and  Linezolid, per ID --wound care and dressing change per order, every other day --referral to wound care center at at discharge  # Severe LLE edema # possible lymphedema --LLE edema worsened with development of a layer of edema with multiple blisters, likely due to IVF given since presentation. --MIVF d/c'ed on 10/27.  Had been receiving IV lasix 40 mg BID. 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   BP acceptable. --IV hydralazine PRN  Hypokalemia: --replete PRN  AKI (acute kidney injury) (HCC) Creatinine 1.49, BUN 25, no baseline creatinine available,  may be due to dehydration versus sepsis. 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  A1c 6.3. --BG within inpatient goal.  No need for fingersticks or SSI.   DVT prophylaxis: Lovenox Code Status: Full Family Communication:   Status is: Inpatient   Dispo: The Stephen [name] is from: Home              Anticipated d/c is to: Home              Anticipated d/c date is: 2-3 days  Stephen [name] currently is not medically stable to d/c.  Severe sepsis 2/2 cellulitis, currently on IV abx. Pending ID and vascular surgery consult.   Consultants:   None  Procedures:   None  Antimicrobials:    Vancomycin  Ceftriaxone  Clindamycin   Subjective: Leg pain with weight bearing.  No new complaints.     Objective: Vitals:   06/05/20 0750 06/05/20 1536 06/05/20 2341 06/06/20 0724  BP: 138/73 (!) 148/82 130/70 (!) 118/100  Pulse: 91 100 90 88  Resp: 18 16 18 18   Temp: 98.7 F (37.1 C) 100.1 F (37.8 C) 99.7 F (37.6 C) 98.6 F (37 C)  TempSrc:  Oral Oral   SpO2: 99% 99% 97% 95%  Weight:      Height:        Intake/Output Summary (Last 24 hours) at 06/06/2020 1151 Last data filed at 06/06/2020 1034 Gross per 24 hour  Intake 1177.2 ml  Output 3000 ml  Net -1822.8 ml   Filed Weights   05/31/20 1004  Weight: 136.1 kg    Examination:  Constitutional: NAD, AAOx3 HEENT: conjunctivae and lids normal, EOMI CV: No cyanosis.   RESP: normal respiratory effort, on RA Extremities: left leg wrapped, still had significant edema, but improved Neuro: II - XII grossly intact.   Psych: Normal mood and affect.  Appropriate judgement and reason    05/31/20    06/02/20    06/05/2020, post local debridement     Data Reviewed: I have personally reviewed following labs and imaging studies  CBC: Recent Labs  Lab 05/31/20 1014 06/01/20 0608 06/02/20 1100 06/03/20 0526 06/04/20 0502 06/05/20 0431 06/06/20 0332  WBC 33.3*   < > 31.0* 30.0* 27.6* 24.4* 20.6*  NEUTROABS 25.8*  --   --   --   --   --   --   HGB 14.6   < > 13.2 12.3* 13.8 13.3 13.5  HCT 43.2   < > 39.3 36.8* 40.7 39.8 41.2  MCV 85.7   < > 86.9 87.8 87.5 86.9 87.7  PLT 361   < > 347 356 424* 455* 508*   < > = values in this interval not displayed.   Basic Metabolic Panel: Recent Labs  Lab 06/02/20 1100 06/03/20 0526 06/04/20 0502 06/05/20 0431 06/06/20 0332  NA 134* 136 132* 131* 134*  K 4.2 4.4 3.9 3.8 4.1  CL 99 103 95* 98 98  CO2 25 24 25 25 27   GLUCOSE 145* 106* 113* 127* 141*  BUN 15 14 15 18 18   CREATININE 1.03 1.10 1.30* 1.32* 1.40*  CALCIUM 8.0* 7.7* 7.9* 7.8* 8.0*  MG  2.3 2.3 2.3 2.2 2.2   GFR: Estimated Creatinine Clearance: 85 mL/min (A) (by C-G formula based on SCr of 1.4 mg/dL (H)). Liver Function Tests: Recent Labs  Lab 05/31/20 1014  AST 26  ALT 36  ALKPHOS 116  BILITOT 1.5*  PROT 8.1  ALBUMIN 3.0*   No results for input(s): LIPASE, AMYLASE in the last 168 hours. No results for input(s): AMMONIA in the last 168 hours. Coagulation Profile: Recent Labs  Lab 05/31/20 1326  INR 1.3*   Cardiac Enzymes: No results for input(s): CKTOTAL, CKMB, CKMBINDEX, TROPONINI in the last 168 hours. BNP (last 3 results) No results for input(s): PROBNP in the last 8760 hours. HbA1C: Recent Labs    06/05/20 0431  HGBA1C 6.3*   CBG: No results for input(s): GLUCAP in the last 168 hours. Lipid Profile: No results for input(s): CHOL, HDL,  LDLCALC, TRIG, CHOLHDL, LDLDIRECT in the last 72 hours. Thyroid Function Tests: No results for input(s): TSH, T4TOTAL, FREET4, T3FREE, THYROIDAB in the last 72 hours. Anemia Panel: No results for input(s): VITAMINB12, FOLATE, FERRITIN, TIBC, IRON, RETICCTPCT in the last 72 hours. Sepsis Labs: Recent Labs  Lab 05/31/20 1014 05/31/20 1214 05/31/20 1326  PROCALCITON  --   --  4.22  LATICACIDVEN 1.7 1.5  --     Recent Results (from the past 240 hour(s))  Blood culture (routine x 2)     Status: None   Collection Time: 05/31/20 10:14 AM   Specimen: BLOOD  Result Value Ref Range Status   Specimen Description BLOOD LEFT ASSIST CONTROL  Final   Special Requests   Final    BOTTLES DRAWN AEROBIC AND ANAEROBIC Blood Culture results may not be optimal due to an excessive volume of blood received in culture bottles   Culture   Final    NO GROWTH 5 DAYS Performed at [facility], [address] [place], [place] [address]    Report Status 06/05/2020 FINAL  Final  Blood culture (routine x 2)     Status: None   Collection Time: 05/31/20  1:48 PM   Specimen: BLOOD  Result Value Ref Range Status   Specimen  Description BLOOD BLOOD LEFT FOREARM  Final   Special Requests   Final    BOTTLES DRAWN AEROBIC AND ANAEROBIC Blood Culture adequate volume   Culture   Final    NO GROWTH 5 DAYS Performed at [facility], [address] [place], [place] [address]    Report Status 06/05/2020 FINAL  Final  Respiratory Panel by RT PCR (Flu A&B, Covid) - Nasopharyngeal Swab     Status: None   Collection Time: 05/31/20  3:02 PM   Specimen: Nasopharyngeal Swab  Result Value Ref Range Status   SARS Coronavirus 2 by RT PCR NEGATIVE NEGATIVE Final    Comment: (NOTE) SARS-CoV-2 target nucleic acids are NOT DETECTED.  The SARS-CoV-2 RNA is generally detectable in upper respiratoy specimens during the acute phase of infection. The lowest concentration of SARS-CoV-2 viral copies this assay can detect is 131 copies/mL. A negative result does not preclude SARS-Cov-2 infection and should not be used as the sole basis for treatment or other Stephen [name] management decisions. A negative result may occur with  improper specimen collection/handling, submission of specimen other than nasopharyngeal swab, presence of viral mutation(s) within the areas targeted by this assay, and inadequate number of viral copies (<131 copies/mL). A negative result must be combined with clinical observations, Stephen [name] history, and epidemiological information. The expected result is Negative.  Fact Sheet for Patients:  https://www.moore.com/  Fact Sheet for Healthcare Providers:  https://www.young.biz/  This test is no t yet approved or cleared by the [place] FDA and  has been authorized for detection and/or diagnosis of SARS-CoV-2 by FDA under an Emergency Use Authorization (EUA). This EUA will remain  in effect (meaning this test can be used) for the duration of the COVID-19 declaration under Section 564(b)(1) of the Act, 21 U.S.C. section 360bbb-3(b)(1), unless the authorization is  terminated or revoked sooner.     Influenza A by PCR NEGATIVE NEGATIVE Final   Influenza B by PCR NEGATIVE NEGATIVE Final    Comment: (NOTE) The Xpert Xpress SARS-CoV-2/FLU/RSV assay is intended as an aid in  the diagnosis of influenza from Nasopharyngeal swab specimens and  should not be used as a sole basis for treatment. Nasal washings and  aspirates are unacceptable for Xpert Xpress SARS-CoV-2/FLU/RSV  testing.  Fact Sheet for Patients: https://www.moore.com/  Fact Sheet for Healthcare Providers: https://www.young.biz/  This test is not yet approved or cleared by the [place] FDA and  has been authorized for detection and/or diagnosis of SARS-CoV-2 by  FDA under an Emergency Use Authorization (EUA). This EUA will remain  in effect (meaning this test can be used) for the duration of the  Covid-19 declaration under Section 564(b)(1) of the Act, 21  U.S.C. section 360bbb-3(b)(1), unless the authorization is  terminated or revoked. Performed at [facility], [address] [place], [place] [address]   Aerobic Culture (superficial specimen)     Status: None (Preliminary result)   Collection Time: 06/04/20 10:53 PM   Specimen: Leg; Wound  Result Value Ref Range Status   Specimen Description   Final    LEG LEFT Performed at [facility], [address], [place], [place] [address]    Special Requests   Final    NONE Performed at [facility], [address] [place], [place] [address]    Gram Stain   Final    RARE WBC PRESENT, PREDOMINANTLY PMN NO ORGANISMS SEEN    Culture   Final    NO GROWTH 1 DAY Performed at [facility], [address] [place], [place] [address]    Report Status PENDING  Incomplete         Radiology Studies: MR TIBIA FIBULA LEFT W WO CONTRAST  Result Date: 06/05/2020 CLINICAL DATA:  Left lower extremity pain and swelling. EXAM: MRI OF LOWER LEFT EXTREMITY  WITHOUT AND WITH CONTRAST TECHNIQUE: Multiplanar, multisequence MR imaging of the left lower extremity was performed both before and after administration of intravenous contrast. CONTRAST:  35mL GADAVIST GADOBUTROL 1 MMOL/ML IV SOLN COMPARISON:  06/01/2020 FINDINGS: Examination is limited by Stephen [name] motion. Diffuse and marked subcutaneous soft tissue swelling/edema/fluid similar to prior study. There are numerous skin lesions, likely blisters. In the upper and mid mid tibia regions anteriorly and medially there are subcutaneous fluid collections which are ill-defined and not well organized. These could be a developing abscesses. No well-formed rim enhancing drainable abscess is identified. No MR findings for myofasciitis or pyomyositis. No evidence of osteomyelitis. No findings suspicious for septic arthritis. IMPRESSION: 1. Persistent diffuse and marked subcutaneous soft tissue swelling/edema/fluid similar to prior study. There are numerous skin lesions, likely blisters. 2. In the upper and mid tibia regions anteriorly and medially there are ill-defined subcutaneous fluid collections which are not well organized. These could be a developing abscesses. No well-formed rim enhancing drainable abscess is identified. 3. No MR findings for myofasciitis or pyomyositis. 4. No findings suspicious for septic arthritis or osteomyelitis. Electronically Signed   By: Rudie Meyer M.D.   On: 06/05/2020 08:39        Scheduled Meds: . enoxaparin (LOVENOX) injection  0.5 mg/kg Subcutaneous Q24H  . hydrOXYzine  100 mg Oral QHS  . lidocaine  1 patch Transdermal Q24H  . senna-docusate  1 tablet Oral BID   Continuous Infusions: . cefTRIAXone (ROCEPHIN)  IV 2 g (06/05/20 1424)  . linezolid (ZYVOX) IV 600 mg (06/06/20 1047)     LOS: 6 days   Darlin Priestly, MD [facility] Pager [phone]  If 7PM-7AM, please contact night-coverage 06/06/2020, 11:51 AM

## 2020-06-06 NOTE — Plan of Care (Signed)
MD on 10/30 busted blisters to leg and cleaned. New orders placed 10/30. Performed by nurse per orders.

## 2020-06-06 NOTE — Unmapped (Signed)
Formatting of this note might be different from the original.    Problem: Education:  Goal: Knowledge of General Education information will improve  Description: Including pain rating scale, medication(s)/side effects and non-pharmacologic comfort measures  Outcome: Progressing    Problem: Health Behavior/Discharge Planning:  Goal: Ability to manage health-related needs will improve  Outcome: Progressing    Electronically signed by Samella Parr, RN at 06/06/2020  9:49 PM EDT

## 2020-06-06 NOTE — Progress Notes (Signed)
Formatting of this note is different from the original.  Images from the original note were not included.    PROGRESS NOTE    Thomas Browning  ZOX:[id number] DOB: Oct 07, 1965 DOA: 05/31/2020  PCP: Patient, No Pcp Per   Brief Narrative:   54 y.o. male with medical history significant of HTN, who presents with left leg pain, swelling, weeping.    Patient states that his left leg pain started approximately 5 days ago, which has been progressively worsening.  Left leg is erythematous, swelling, with significant blistering, open wound and weeping from several popped blisters. The pain is constant, sharp, moderate to severe, nonradiating. 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  Ultrasound on admission negative.    Patient presented with left leg complaints also complains of constipation neck pain.  Started on broad-spectrum IV antibiotics on admission.    Patient's main complaints are constipation and neck pain.  KUB negative for obstruction.  X-ray C-spine negative for fracture.    Assessment & Plan:    Principal Problem:    Left leg cellulitis  Active Problems:    Hypertension    Sepsis (HCC)    Hypokalemia    AKI (acute kidney injury) (HCC)    Cellulitis of left leg    Severe Sepsis due to   left leg cellulitis:   Patient meets criteria for sepsis with leukocytosis with WBC 33.3 and tachycardia with heart rate 100 -->95.  with AKI.  Lactate is normal, currently hemodynamically stable.   Left lower extremity venous Doppler is negative for DVT  Marked leukocytosis  Elevated inflammatory markers.  --MR showed "Findings of diffuse cellulitis with superficial cyst. Non loculated fluid within the fascial layers. No evidence of osteomyelitis or abscess."  --started on vanc/ceftriaxone, with IV clinda added later then d/c'ed again.  Vanc switched to Linezolid.  --ID consulted, collected fluid from blister for culture.  --repeat MRI LLE 10/29 ruled out fascitis and show no drainable fluid  collection.  --Vascular surgery performed local debridement at bedside 10/30  Plan:  --cont ceftriaxone and Linezolid, per ID  --wound care and dressing change per order, every other day  --referral to wound care center at at discharge    # Severe LLE edema  # possible lymphedema  --LLE edema worsened with development of a layer of edema with multiple blisters, likely due to IVF given since presentation.  --MIVF d/c'ed on 10/27.  Had been receiving IV lasix 40 mg BID.  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    BP acceptable.  --IV hydralazine PRN    Hypokalemia:  --replete PRN    AKI (acute kidney injury) (HCC)  Creatinine 1.49, BUN 25, no baseline creatinine available,   may be due to dehydration versus sepsis.  --Cr improved to 1.03 after IVF and tx of cellulitis   PLAN:  --d/c IV lasix due to Cr trending up    Cervicalgia  Patient is a truck driver  Exam significant for rigid lump in posterior neck  Noted to soft tissue on x-ray  C-spine x-ray negative for fracture  Plan:  --cont  lidocaine patch    Morbid obesity  BMI 41.8  This complicates care and prognosis    Hyperglycemia  Pre-diabetes  --No hx of DM2.  A1c 6.3.  --BG within inpatient goal.  No need for fingersticks or SSI.    DVT prophylaxis: Lovenox  Code Status: Full  Family Communication:    Status is: Inpatient    Dispo: The patient is from: Home               Anticipated d/c is to: Home               Anticipated d/c date is: 2-3 days               Patient currently is not medically stable to d/c.  Severe sepsis 2/2 cellulitis, currently on IV abx. Pending ID and vascular surgery  consult.    Consultants:    None    Procedures:    None    Antimicrobials:    Vancomycin   Ceftriaxone   Clindamycin    Subjective:  Leg pain with weight bearing.  No new complaints.      Objective:  Vitals:    06/05/20 0750 06/05/20 1536 06/05/20 2341 06/06/20 0724   BP: 138/73 (!) 148/82 130/70 (!) 118/100   Pulse: 91 100 90 88   Resp: 18 16 18 18    Temp: 98.7 F (37.1 C) 100.1 F (37.8 C) 99.7 F (37.6 C) 98.6 F (37 C)   TempSrc:  Oral Oral    SpO2: 99% 99% 97% 95%   Weight:       Height:         Intake/Output Summary (Last 24 hours) at 06/06/2020 1151  Last data filed at 06/06/2020 1034  Gross per 24 hour   Intake 1177.2 ml   Output 3000 ml   Net -1822.8 ml     Filed Weights    05/31/20 1004   Weight: 136.1 kg     Examination:    Constitutional: NAD, AAOx3  HEENT: conjunctivae and lids normal, EOMI  CV: No cyanosis.    RESP: normal respiratory effort, on RA  Extremities: left leg wrapped, still had significant edema, but improved  Neuro: II - XII grossly intact.    Psych: Normal mood and affect.  Appropriate judgement and reason    05/31/20    06/02/20    06/05/2020, post local debridement    Data Reviewed: I have personally reviewed following labs and imaging studies    CBC:  Recent Labs   Lab 05/31/20  1014 06/01/20  0608 06/02/20  1100 06/03/20  0526 06/04/20  0502 06/05/20  0431 06/06/20  0332   WBC 33.3*   < > 31.0* 30.0* 27.6* 24.4* 20.6*   NEUTROABS 25.8*  --   --   --   --   --   --    HGB 14.6   < > 13.2 12.3* 13.8 13.3 13.5   HCT 43.2   < > 39.3 36.8* 40.7 39.8 41.2   MCV 85.7   < > 86.9 87.8 87.5 86.9 87.7   PLT 361   < > 347 356 424* 455* 508*    < > = values in this interval not displayed.     Basic Metabolic Panel:  Recent Labs   Lab 06/02/20  1100 06/03/20  0526 06/04/20  0502 06/05/20  0431 06/06/20  0332   NA 134* 136 132* 131* 134*   K 4.2 4.4 3.9 3.8 4.1  CL 99 103 95* 98 98   CO2 25 24 25 25 27    GLUCOSE 145* 106* 113* 127* 141*   BUN 15 14 15 18 18    CREATININE 1.03 1.10 1.30*  1.32* 1.40*   CALCIUM 8.0* 7.7* 7.9* 7.8* 8.0*   MG 2.3 2.3 2.3 2.2 2.2     GFR:  Estimated Creatinine Clearance: 85 mL/min (A) (by C-G formula based on SCr of 1.4 mg/dL (H)).  Liver Function Tests:  Recent Labs   Lab 05/31/20  1014   AST 26   ALT 36   ALKPHOS 116   BILITOT 1.5*   PROT 8.1   ALBUMIN 3.0*     No results for input(s): LIPASE, AMYLASE in the last 168 hours.  No results for input(s): AMMONIA in the last 168 hours.  Coagulation Profile:  Recent Labs   Lab 05/31/20  1326   INR 1.3*     Cardiac Enzymes:  No results for input(s): CKTOTAL, CKMB, CKMBINDEX, TROPONINI in the last 168 hours.  BNP (last 3 results)  No results for input(s): PROBNP in the last 8760 hours.  HbA1C:  Recent Labs     06/05/20  0431   HGBA1C 6.3*     CBG:  No results for input(s): GLUCAP in the last 168 hours.  Lipid Profile:  No results for input(s): CHOL, HDL, LDLCALC, TRIG, CHOLHDL, LDLDIRECT in the last 72 hours.  Thyroid Function Tests:  No results for input(s): TSH, T4TOTAL, FREET4, T3FREE, THYROIDAB in the last 72 hours.  Anemia Panel:  No results for input(s): VITAMINB12, FOLATE, FERRITIN, TIBC, IRON, RETICCTPCT in the last 72 hours.  Sepsis Labs:  Recent Labs   Lab 05/31/20  1014 05/31/20  1214 05/31/20  1326   PROCALCITON  --   --  4.22   LATICACIDVEN 1.7 1.5  --      Recent Results (from the past 240 hour(s))   Blood culture (routine x 2)     Status: None    Collection Time: 05/31/20 10:14 AM    Specimen: BLOOD   Result Value Ref Range Status    Specimen Description BLOOD LEFT ASSIST CONTROL  Final    Special Requests   Final     BOTTLES DRAWN AEROBIC AND ANAEROBIC Blood Culture results may not be optimal due to an excessive volume of blood received in culture bottles    Culture   Final     NO GROWTH 5 DAYS  Performed at [facility], [address], [place], [place] [address]     Report Status 06/05/2020 FINAL  Final   Blood culture (routine x 2)     Status: None    Collection Time: 05/31/20  1:48 PM    Specimen:  BLOOD   Result Value Ref Range Status    Specimen Description BLOOD BLOOD LEFT FOREARM  Final    Special Requests   Final     BOTTLES DRAWN AEROBIC AND ANAEROBIC Blood Culture adequate volume    Culture   Final     NO GROWTH 5 DAYS  Performed at [facility], [address], [place],  [address]     Report Status 06/05/2020 FINAL  Final   Respiratory Panel by RT PCR (Flu A&B, Covid) - Nasopharyngeal Swab     Status: None    Collection Time: 05/31/20  3:02 PM    Specimen: Nasopharyngeal Swab   Result Value Ref Range Status    SARS Coronavirus 2 by RT PCR  NEGATIVE NEGATIVE Final     Comment: (NOTE)  SARS-CoV-2 target nucleic acids are NOT DETECTED.    The SARS-CoV-2 RNA is generally detectable in upper respiratoy  specimens during the acute phase of infection. The lowest  concentration of SARS-CoV-2 viral copies this assay can detect is  131 copies/mL. A negative result does not preclude SARS-Cov-2  infection and should not be used as the sole basis for treatment or  other patient management decisions. A negative result may occur with   improper specimen collection/handling, submission of specimen other  than nasopharyngeal swab, presence of viral mutation(s) within the  areas targeted by this assay, and inadequate number of viral copies  (<131 copies/mL). A negative result must be combined with clinical  observations, patient history, and epidemiological information. The  expected result is Negative.    Fact Sheet for Patients:   https://www.moore.com/    Fact Sheet for Healthcare Providers:   https://www.young.biz/    This test is no t yet approved or cleared by the [place] FDA and   has been authorized for detection and/or diagnosis of SARS-CoV-2 by  FDA under an Emergency Use Authorization (EUA). This EUA will remain   in effect (meaning this test can be used) for the duration of the  COVID-19 declaration under Section 564(b)(1) of the Act, 21  U.S.C.  section 360bbb-3(b)(1), unless the authorization is terminated or  revoked sooner.       Influenza A by PCR NEGATIVE NEGATIVE Final    Influenza B by PCR NEGATIVE NEGATIVE Final     Comment: (NOTE)  The Xpert Xpress SARS-CoV-2/FLU/RSV assay is intended as an aid in   the diagnosis of influenza from Nasopharyngeal swab specimens and   should not be used as a sole basis for treatment. Nasal washings and   aspirates are unacceptable for Xpert Xpress SARS-CoV-2/FLU/RSV   testing.    Fact Sheet for Patients:  https://www.moore.com/    Fact Sheet for Healthcare Providers:  https://www.young.biz/    This test is not yet approved or cleared by the [place] FDA and   has been authorized for detection and/or diagnosis of SARS-CoV-2 by   FDA under an Emergency Use Authorization (EUA). This EUA will remain   in effect (meaning this test can be used) for the duration of the   Covid-19 declaration under Section 564(b)(1) of the Act, 21   U.S.C. section 360bbb-3(b)(1), unless the authorization is   terminated or revoked.  Performed at [facility], [address] [place],  [place] [address]    Aerobic Culture (superficial specimen)     Status: None (Preliminary result)    Collection Time: 06/04/20 10:53 PM    Specimen: Leg; Wound   Result Value Ref Range Status    Specimen Description   Final     LEG LEFT  Performed at [facility], [address] [place], [place] [address]     Special Requests   Final     NONE  Performed at [facility], [address] [place], [place] [address]     Gram Stain   Final     RARE WBC PRESENT, PREDOMINANTLY PMN  NO ORGANISMS SEEN     Culture   Final     NO GROWTH 1 DAY  Performed at [facility], [address] [place], [place] [address]     Report Status PENDING  Incomplete       Radiology Studies:  MR TIBIA FIBULA LEFT  W WO CONTRAST    Result Date: 06/05/2020  CLINICAL DATA:  Left lower extremity pain and  swelling. EXAM: MRI OF LOWER LEFT EXTREMITY WITHOUT AND WITH CONTRAST TECHNIQUE: Multiplanar, multisequence MR imaging of the left lower extremity was performed both before and after administration of intravenous contrast. CONTRAST:  10mL GADAVIST GADOBUTROL 1 MMOL/ML IV SOLN COMPARISON:  06/01/2020 FINDINGS: Examination is limited by patient motion. Diffuse and marked subcutaneous soft tissue swelling/edema/fluid similar to prior study. There are numerous skin lesions, likely blisters. In the upper and mid mid tibia regions anteriorly and medially there are subcutaneous fluid collections which are ill-defined and not well organized. These could be a developing abscesses. No well-formed rim enhancing drainable abscess is identified. No MR findings for myofasciitis or pyomyositis. No evidence of osteomyelitis. No findings suspicious for septic arthritis. IMPRESSION: 1. Persistent diffuse and marked subcutaneous soft tissue swelling/edema/fluid similar to prior study. There are numerous skin lesions, likely blisters. 2. In the upper and mid tibia regions anteriorly and medially there are ill-defined subcutaneous fluid collections which are not well organized. These could be a developing abscesses. No well-formed rim enhancing drainable abscess is identified. 3. No MR findings for myofasciitis or pyomyositis. 4. No findings suspicious for septic arthritis or osteomyelitis. Electronically Signed   By: Rudie Meyer M.D.   On: 06/05/2020 08:39     Scheduled Meds:  ? enoxaparin (LOVENOX) injection  0.5 mg/kg Subcutaneous Q24H   ? hydrOXYzine  100 mg Oral QHS   ? lidocaine  1 patch Transdermal Q24H   ? senna-docusate  1 tablet Oral BID     Continuous Infusions:  ? cefTRIAXone (ROCEPHIN)  IV 2 g (06/05/20 1424)   ? linezolid (ZYVOX) IV 600 mg (06/06/20 1047)      LOS: 6 days     Darlin Priestly, MD  [facility]  Pager [phone]    If 7PM-7AM, please contact night-coverage  06/06/2020, 11:51 AM   Electronically signed by  Darlin Priestly, MD at 06/06/2020  3:19 PM EDT

## 2020-06-06 NOTE — Unmapped (Signed)
Formatting of this note might be different from the original.  MD on 10/30 busted blisters to leg and cleaned. New orders placed 10/30. Performed by nurse per orders.  Electronically signed by Lucendia Herrlich, RN at 06/06/2020 10:14 AM EDT

## 2020-06-06 NOTE — Progress Notes (Signed)
Formatting of this note is different from the original.  [facility]    Daily Progress Note    s/p local left lower extremity debridements at the bedside    Subjective:  54 year old gentleman with venous insufficiency left lower extremity with cellulitis and large blisters.  We did a superficial debridement yesterday and placed calcium alginate.  This will help absorb some of the drainage.     Patient states his left leg feels better.  Swelling is improved.    Objective:  Vitals:    06/05/20 0750 06/05/20 1536 06/05/20 2341 06/06/20 0724   BP: 138/73 (!) 148/82 130/70 (!) 118/100   Pulse: 91 100 90 88   Resp: 18 16 18 18    Temp: 98.7 F (37.1 C) 100.1 F (37.8 C) 99.7 F (37.6 C) 98.6 F (37 C)   TempSrc:  Oral Oral    SpO2: 99% 99% 97% 95%   Weight:       Height:         Intake/Output Summary (Last 24 hours) at 06/06/2020 1149  Last data filed at 06/06/2020 1034  Gross per 24 hour   Intake 1177.2 ml   Output 3000 ml   Net -1822.8 ml     Physical Exam:  A&Ox3, NAD  CV: RRR  Pulmonary: CTA Bilaterally  Abdomen: Soft, Nontender, Nondistended  Vascular: Dressings are intact.    Laboratory:  CBC    Component Value Date/Time    WBC 20.6 (H) 06/06/2020 0332    HGB 13.5 06/06/2020 0332    HCT 41.2 06/06/2020 0332    PLT 508 (H) 06/06/2020 0332     BMET    Component Value Date/Time    NA 134 (L) 06/06/2020 0332    K 4.1 06/06/2020 0332    CL 98 06/06/2020 0332    CO2 27 06/06/2020 0332    GLUCOSE 141 (H) 06/06/2020 0332    BUN 18 06/06/2020 0332    CREATININE 1.40 (H) 06/06/2020 0332    CALCIUM 8.0 (L) 06/06/2020 0332    GFRNONAA 60 (L) 06/06/2020 8469     Assessment/Planning:  54 year old gentleman with venous insufficiency and ulceration.  No vascular surgery intervention is required at this point time.  We would recommend continued with local wound care and referral to the wound care center.  He can follow-up with vascular surgery as needed.    Boneta Lucks, MD  Vascular and Endovascular  Surgery  06/06/2020  11:49 AM    Electronically signed by Cathleen Fears, MD at 06/06/2020 11:51 AM EDT

## 2020-06-07 LAB — BASIC METABOLIC PANEL
Anion gap: 9 (ref 5–15)
BUN: 16 mg/dL (ref 6–20)
CO2: 25 mmol/L (ref 22–32)
Calcium: 7.7 mg/dL — ABNORMAL LOW (ref 8.9–10.3)
Chloride: 98 mmol/L (ref 98–111)
Creatinine, Ser: 1.18 mg/dL (ref 0.61–1.24)
GFR, Estimated: >60 mL/min (ref >60)
Glucose, Bld: 113 mg/dL — ABNORMAL HIGH (ref 70–99)
Potassium: 3.9 mmol/L (ref 3.5–5.1)
Sodium: 132 mmol/L — ABNORMAL LOW (ref 135–145)

## 2020-06-07 LAB — AEROBIC CULTURE W GRAM STAIN (SUPERFICIAL SPECIMEN): Culture: NO GROWTH

## 2020-06-07 LAB — CBC
HCT: 37.3 % — ABNORMAL LOW (ref 39.0–52.0)
Hemoglobin: 12.6 g/dL — ABNORMAL LOW (ref 13.0–17.0)
MCH: 29.4 pg (ref 26.0–34.0)
MCHC: 33.8 g/dL (ref 30.0–36.0)
MCV: 87.1 fL (ref 80.0–100.0)
Platelets: 517 10*3/uL — ABNORMAL HIGH (ref 150–400)
RBC: 4.28 MIL/uL (ref 4.22–5.81)
RDW: 14.6 % (ref 11.5–15.5)
WBC: 17.3 10*3/uL — ABNORMAL HIGH (ref 4.0–10.5)
nRBC: 0 % (ref 0.0–0.2)

## 2020-06-07 LAB — MAGNESIUM: Magnesium: 2.2 mg/dL (ref 1.7–2.4)

## 2020-06-07 MED ORDER — LINEZOLID 600 MG PO TABS
600.0000 mg | ORAL_TABLET | Freq: Two times a day (BID) | ORAL | Status: DC
  Administered 2020-06-07 – 2020-06-08 (×3): 600 mg via ORAL
  Filled 2020-06-07 (×4): qty 1

## 2020-06-07 MED ORDER — POLYETHYLENE GLYCOL 3350 17 G PO PACK
34.0000 g | PACK | ORAL | Status: AC
  Administered 2020-06-07 (×4): 34 g via ORAL
  Filled 2020-06-07 (×5): qty 2

## 2020-06-07 MED ORDER — FUROSEMIDE 10 MG/ML IJ SOLN
40.0000 mg | Freq: Once | INTRAMUSCULAR | Status: AC
  Administered 2020-06-07: 40 mg via INTRAVENOUS
  Filled 2020-06-07: qty 4

## 2020-06-07 NOTE — Progress Notes (Signed)
PT Cancellation Note  Patient Details Name: KALAB CAMPS MRN: [id number] DOB: 1965-09-24   Cancelled Treatment:    Reason Eval/Treat Not Completed: Other (comment).  Pt requesting to sleep at this time and declining physical therapy session.  Will re-attempt PT session at a later date/time as able.  Hendricks Limes, PT 06/07/20, 9:51 AM

## 2020-06-07 NOTE — Progress Notes (Signed)
Ch visited with Pt. Pt requested prayer for healing. Pt tearful during prayer. Supported by mother. Ch said she will continue to pray for him. Pt grateful for visit.

## 2020-06-07 NOTE — Progress Notes (Signed)
[facility] CLINIC INFECTIOUS DISEASE PROGRESS NOTE Date of Admission:  05/31/2020     ID: Stephen [name] is a 54 y.o. male with LE cellulitis Principal Problem:   Left leg cellulitis Active Problems:   Hypertension   Sepsis (HCC)   Hypokalemia   AKI (acute kidney injury) (HCC)   Cellulitis of left leg   Subjective: WBC down, low grade fevers. Had debridement of superficial skin by vascular. Had MRI done 10.30. ROS  Eleven systems are reviewed and negative except per hpi  Medications:  Antibiotics Given (last 72 hours)    Date/Time Action Medication Dose Rate   06/04/20 2244 New Bag/Given   linezolid (ZYVOX) IVPB 600 mg 600 mg 300 mL/hr   06/05/20 1424 New Bag/Given   cefTRIAXone (ROCEPHIN) 2 g in sodium chloride 0.9 % 100 mL IVPB 2 g 200 mL/hr   06/05/20 1821 New Bag/Given   linezolid (ZYVOX) IVPB 600 mg 600 mg 300 mL/hr   06/05/20 2208 New Bag/Given   linezolid (ZYVOX) IVPB 600 mg 600 mg 300 mL/hr   06/06/20 1047 New Bag/Given   linezolid (ZYVOX) IVPB 600 mg 600 mg 300 mL/hr   06/06/20 1248 New Bag/Given   cefTRIAXone (ROCEPHIN) 2 g in sodium chloride 0.9 % 100 mL IVPB 2 g 200 mL/hr   06/06/20 2105 New Bag/Given   linezolid (ZYVOX) IVPB 600 mg 600 mg 300 mL/hr   06/07/20 0823 New Bag/Given   linezolid (ZYVOX) IVPB 600 mg 600 mg 300 mL/hr     . enoxaparin (LOVENOX) injection  0.5 mg/kg Subcutaneous Q24H  . hydrOXYzine  100 mg Oral QHS  . lidocaine  1 patch Transdermal Q24H  . polyethylene glycol  34 g Oral Q2H  . senna-docusate  1 tablet Oral BID    Objective: Vital signs in last 24 hours: Temp:  [97.9 F (36.6 C)-100.1 F (37.8 C)] 98.6 F (37 C) (11/01 0725) Pulse Rate:  [86-94] 86 (11/01 0725) Resp:  [17-18] 17 (11/01 0725) BP: (123-135)/(67-85) 123/75 (11/01 0725) SpO2:  [97 %-98 %] 97 % (11/01 0725) Physical Exam  Constitutional: He is oriented to person, place, and time. obese Cardiovascular: Normal rate, regular rhythm and normal heart  sounds.Pulmonary/Chest: Effort normal and breath sounds normal. No respiratory distress. He has no wheezes.  Abdominal: Soft. Bowel sounds are normal. He exhibits no distension. There is no tenderness. Lymphadenopathy: He has no cervical adenopathy.  Neurological: He is alert and oriented to person, place, and time.  Skin:LLE  extensive denuded skin but lesions are dry and no drainage Psychiatric: He has a normal mood and affect. His behavior is normal.    Lab Results Recent Labs    06/06/20 0332 06/07/20 0424  WBC 20.6* 17.3*  HGB 13.5 12.6*  HCT 41.2 37.3*  NA 134* 132*  K 4.1 3.9  CL 98 98  CO2 27 25  BUN 18 16  CREATININE 1.40* 1.18    Microbiology: Results for orders placed or performed during the hospital encounter of 05/31/20  Blood culture (routine x 2)     Status: None   Collection Time: 05/31/20 10:14 AM   Specimen: BLOOD  Result Value Ref Range Status   Specimen Description BLOOD LEFT ASSIST CONTROL  Final   Special Requests   Final    BOTTLES DRAWN AEROBIC AND ANAEROBIC Blood Culture results may not be optimal due to an excessive volume of blood received in culture bottles   Culture   Final    NO GROWTH 5 DAYS Performed at  [facility], [address], [place], [place] [address]    Report Status 06/05/2020 FINAL  Final  Blood culture (routine x 2)     Status: None   Collection Time: 05/31/20  1:48 PM   Specimen: BLOOD  Result Value Ref Range Status   Specimen Description BLOOD BLOOD LEFT FOREARM  Final   Special Requests   Final    BOTTLES DRAWN AEROBIC AND ANAEROBIC Blood Culture adequate volume   Culture   Final    NO GROWTH 5 DAYS Performed at [facility], [address] [place], [place] [address]    Report Status 06/05/2020 FINAL  Final  Respiratory Panel by RT PCR (Flu A&B, Covid) - Nasopharyngeal Swab     Status: None   Collection Time: 05/31/20  3:02 PM   Specimen: Nasopharyngeal Swab  Result Value Ref Range Status   SARS  Coronavirus 2 by RT PCR NEGATIVE NEGATIVE Final    Comment: (NOTE) SARS-CoV-2 target nucleic acids are NOT DETECTED.  The SARS-CoV-2 RNA is generally detectable in upper respiratoy specimens during the acute phase of infection. The lowest concentration of SARS-CoV-2 viral copies this assay can detect is 131 copies/mL. A negative result does not preclude SARS-Cov-2 infection and should not be used as the sole basis for treatment or other patient management decisions. A negative result may occur with  improper specimen collection/handling, submission of specimen other than nasopharyngeal swab, presence of viral mutation(s) within the areas targeted by this assay, and inadequate number of viral copies (<131 copies/mL). A negative result must be combined with clinical observations, patient history, and epidemiological information. The expected result is Negative.  Fact Sheet for Patients:  https://www.moore.com/  Fact Sheet for Healthcare Providers:  https://www.young.biz/  This test is no t yet approved or cleared by the [place] FDA and  has been authorized for detection and/or diagnosis of SARS-CoV-2 by FDA under an Emergency Use Authorization (EUA). This EUA will remain  in effect (meaning this test can be used) for the duration of the COVID-19 declaration under Section 564(b)(1) of the Act, 21 U.S.C. section 360bbb-3(b)(1), unless the authorization is terminated or revoked sooner.     Influenza A by PCR NEGATIVE NEGATIVE Final   Influenza B by PCR NEGATIVE NEGATIVE Final    Comment: (NOTE) The Xpert Xpress SARS-CoV-2/FLU/RSV assay is intended as an aid in  the diagnosis of influenza from Nasopharyngeal swab specimens and  should not be used as a sole basis for treatment. Nasal washings and  aspirates are unacceptable for Xpert Xpress SARS-CoV-2/FLU/RSV  testing.  Fact Sheet for  Patients: https://www.moore.com/  Fact Sheet for Healthcare Providers: https://www.young.biz/  This test is not yet approved or cleared by the [place] FDA and  has been authorized for detection and/or diagnosis of SARS-CoV-2 by  FDA under an Emergency Use Authorization (EUA). This EUA will remain  in effect (meaning this test can be used) for the duration of the  Covid-19 declaration under Section 564(b)(1) of the Act, 21  U.S.C. section 360bbb-3(b)(1), unless the authorization is  terminated or revoked. Performed at [facility], [address] [place], [place] [address]   Aerobic Culture (superficial specimen)     Status: None   Collection Time: 06/04/20 10:53 PM   Specimen: Leg; Wound  Result Value Ref Range Status   Specimen Description   Final    LEG LEFT Performed at [facility], [address] [place], [place] [address]    Special Requests  Final    NONE Performed at [facility], [address] [place], [place] [address]    Gram Stain   Final    RARE WBC PRESENT, PREDOMINANTLY PMN NO ORGANISMS SEEN    Culture   Final    NO GROWTH 2 DAYS Performed at [facility], [address], [place], [place] [address]    Report Status 06/07/2020 FINAL  Final    Studies/Results: IMPRESSION: 1. Persistent diffuse and marked subcutaneous soft tissue swelling/edema/fluid similar to prior study. There are numerous skin lesions, likely blisters. 2. In the upper and mid tibia regions anteriorly and medially there are ill-defined subcutaneous fluid collections which are not well organized. These could be a developing abscesses. No well-formed rim enhancing drainable abscess is identified. 3. No MR findings for myofasciitis or pyomyositis. 4. No findings suspicious for septic arthritis or osteomyelitis.   Electronically Signed   By: Rudie Meyer M.D.   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 He was doing a long drive to [place] when he noted the left leg was swelling 5 days before- He poured some rubbng alcohol on it. He drove back to  and  On Saturday noted the leg was red and weeping .came to the ED on Monday He had some chills , He does not remember any trauma. No animal or insect bites, no fishing, walks with flip flops and hence could have stepped in dirty water On admit wbc 33. Started on ceftriaxone, and vanco then changed to linezolid.  06/07/20 WBC down to 17. Bcx neg, leg cx from 10/29 neg.   Recommendations Cont ceftriaxone and linezolid.  Pending culture from wound 10/29 - NGTD. Encouraged elevation on 6 pillows.  Discussed with Dr Fran Lowes. Will continue diuresis as well. Will likely need a few more days IV then transition to oral keflex if no other organisms grow. Will likely need prolonged abx and unnaboot placement at dc.  Thank you very much for the consult. Will follow with you.  Mick Sell   06/07/2020, 12:51 PM

## 2020-06-07 NOTE — Progress Notes (Signed)
PROGRESS NOTE    Stephen [name]  RUE:[id number] DOB: August 20, 1965 DOA: 05/31/2020 PCP: Patient, No Pcp Per  Brief Narrative:  54 y.o. male with medical history significant of HTN, who presents with left leg pain, swelling, weeping.  Patient states that his left leg pain started approximately 5 days ago, which has been progressively worsening.  Left leg is erythematous, swelling, with significant blistering, open wound and weeping from several popped blisters. The pain is constant, sharp, moderate to severe, nonradiating. 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  Ultrasound on admission negative.  Patient presented with left leg complaints also complains of constipation neck pain.  Started on broad-spectrum IV antibiotics on admission.  Patient's main complaints are constipation and neck pain.  KUB negative for obstruction.  X-ray C-spine negative for fracture.    Assessment & Plan:   Principal Problem:   Left leg cellulitis Active Problems:   Hypertension   Sepsis (HCC)   Hypokalemia   AKI (acute kidney injury) (HCC)   Cellulitis of left leg  Severe Sepsis due to  left leg cellulitis:  Patient meets criteria for sepsis with leukocytosis with WBC 33.3 and tachycardia with heart rate 100 -->95.  with AKI. Lactate is normal, currently hemodynamically stable.  Left lower extremity venous Doppler is negative for DVT Marked leukocytosis Elevated inflammatory markers. --MR showed "Findings of diffuse cellulitis with superficial cyst. Non loculated fluid within the fascial layers. No evidence of osteomyelitis or abscess." --started on vanc/ceftriaxone, with IV clinda added later then d/c'ed again.  Vanc switched to Linezolid. --ID consulted, collected fluid from blister for culture, NGTD. --repeat MRI LLE 10/29 ruled out fascitis and show no drainable fluid collection. --Vascular surgery performed local debridement at bedside 10/30, signed off Plan: --cont  ceftriaxone and Linezolid (switched to oral), per ID --elevation on 6 pillows. --wound care and dressing change per order, every other day --referral to wound care center at at discharge  # Severe LLE edema # possible lymphedema --LLE edema worsened with development of a layer of edema with multiple blisters, likely due to IVF given since presentation. --MIVF d/c'ed on 10/27.  Had been receiving IV lasix 40 mg BID.  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   BP acceptable. --IV hydralazine PRN  Hypokalemia: --replete PRN  AKI (acute kidney injury) (HCC) Creatinine 1.49, BUN 25, no baseline creatinine available,  may be due to dehydration versus sepsis. --Cr improved to 1.03 after IVF and tx of cellulitis   Cervicalgia Patient is a truck driver Exam significant for rigid lump in posterior neck Noted to soft tissue on x-ray C-spine x-ray negative for fracture Plan: --cont lidocaine patch  Morbid obesity BMI 41.8 This complicates care and prognosis  Hyperglycemia Pre-diabetes --No hx of DM2.  A1c 6.3. --BG within inpatient goal.  No need for fingersticks or SSI.  Thrombocythemia  --plt normal on presentation, now trending up.  Likely reactive. --trend for now   DVT prophylaxis: Lovenox Code Status: Full Family Communication:   Status is: Inpatient   Dispo: The patient is  from: Home              Anticipated d/c is to: Home              Anticipated d/c date is: 2-3 days               Patient currently is not medically stable to d/c.  Severe sepsis 2/2 cellulitis, currently on IV abx, per ID rec.  Consultants:   None  Procedures:   None  Antimicrobials:   Vancomycin  Ceftriaxone  Clindamycin   Subjective: Doing ok, less pain in his leg.  Has been able to be up and going to the bathroom.    Vascular surgery signed off.  Rec outpatient wound care center.   Objective: Vitals:   06/06/20 1506 06/06/20 2340 06/07/20 0725 06/07/20 1603  BP: 135/85 123/67 123/75 125/71  Pulse: 87 94 86 90  Resp: 17 18 17 16   Temp: 97.9 F (36.6 C) 100.1 F (37.8 C) 98.6 F (37 C) 99.2 F (37.3 C)  TempSrc: Oral Oral Oral Oral  SpO2: 98% 97% 97% 99%  Weight:      Height:        Intake/Output Summary (Last 24 hours) at 06/07/2020 1610 Last data filed at 06/07/2020 0549 Gross per 24 hour  Intake 1007.8 ml  Output 1150 ml  Net -142.2 ml   Filed Weights   05/31/20 1004  Weight: 136.1 kg    Examination:  Constitutional: NAD, AAOx3 HEENT: conjunctivae and lids normal, EOMI CV: No cyanosis.   RESP: normal respiratory effort, on RA Extremities: RLE no erythema no tenderness.   Neuro: II - XII grossly intact.   Psych: Normal mood and affect.  Appropriate judgement and reason   05/31/20    06/02/20    06/05/2020, post local debridement     Data Reviewed: I have personally reviewed following labs and imaging studies  CBC: Recent Labs  Lab 06/03/20 0526 06/04/20 0502 06/05/20 0431 06/06/20 0332 06/07/20 0424  WBC 30.0* 27.6* 24.4* 20.6* 17.3*  HGB 12.3* 13.8 13.3 13.5 12.6*  HCT 36.8* 40.7 39.8 41.2 37.3*  MCV 87.8 87.5 86.9 87.7 87.1  PLT 356 424* 455* 508* 517*   Basic Metabolic Panel: Recent Labs  Lab 06/03/20 0526 06/04/20 0502 06/05/20 0431 06/06/20 0332 06/07/20 0424  NA 136 132* 131* 134* 132*  K 4.4 3.9 3.8 4.1 3.9  CL 103 95* 98 98 98  CO2 24 25 25 27 25   GLUCOSE 106* 113* 127* 141* 113*  BUN 14 15 18 18 16   CREATININE  1.10 1.30* 1.32* 1.40* 1.18  CALCIUM 7.7* 7.9* 7.8* 8.0* 7.7*  MG 2.3 2.3 2.2 2.2 2.2   GFR: Estimated Creatinine Clearance: 100.8 mL/min (by C-G formula based on SCr of 1.18 mg/dL). Liver Function Tests: No results for input(s): AST, ALT, ALKPHOS, BILITOT, PROT, ALBUMIN in the last 168 hours. No results for input(s): LIPASE, AMYLASE in the last 168 hours. No results for input(s): AMMONIA in the last 168 hours. Coagulation Profile: No results for input(s): INR, PROTIME in the last 168 hours. Cardiac Enzymes: No results for input(s): CKTOTAL, CKMB, CKMBINDEX, TROPONINI in the last 168 hours. BNP (last 3 results) No results for input(s): PROBNP in the last 8760 hours. HbA1C: Recent Labs    06/05/20 0431  HGBA1C 6.3*   CBG: No results for input(s): GLUCAP in the last 168 hours. Lipid Profile: No results for input(s): CHOL, HDL, LDLCALC, TRIG, CHOLHDL, LDLDIRECT in the last 72 hours. Thyroid  Function Tests: No results for input(s): TSH, T4TOTAL, FREET4, T3FREE, THYROIDAB in the last 72 hours. Anemia Panel: No results for input(s): VITAMINB12, FOLATE, FERRITIN, TIBC, IRON, RETICCTPCT in the last 72 hours. Sepsis Labs: No results for input(s): PROCALCITON, LATICACIDVEN in the last 168 hours.  Recent Results (from the past 240 hour(s))  Blood culture (routine x 2)     Status: None   Collection Time: 05/31/20 10:14 AM   Specimen: BLOOD  Result Value Ref Range Status   Specimen Description BLOOD LEFT ASSIST CONTROL  Final   Special Requests   Final    BOTTLES DRAWN AEROBIC AND ANAEROBIC Blood Culture results may not be optimal due to an excessive volume of blood received in culture bottles   Culture   Final    NO GROWTH 5 DAYS Performed at [facility], [address] [place], [place] [address]    Report Status 06/05/2020 FINAL  Final  Blood culture (routine x 2)     Status: None   Collection Time: 05/31/20  1:48 PM   Specimen: BLOOD  Result Value Ref Range Status     Specimen Description BLOOD BLOOD LEFT FOREARM  Final   Special Requests   Final    BOTTLES DRAWN AEROBIC AND ANAEROBIC Blood Culture adequate volume   Culture   Final    NO GROWTH 5 DAYS Performed at [facility], [address] [place], [place] [address]    Report Status 06/05/2020 FINAL  Final  Respiratory Panel by RT PCR (Flu A&B, Covid) - Nasopharyngeal Swab     Status: None   Collection Time: 05/31/20  3:02 PM   Specimen: Nasopharyngeal Swab  Result Value Ref Range Status   SARS Coronavirus 2 by RT PCR NEGATIVE NEGATIVE Final    Comment: (NOTE) SARS-CoV-2 target nucleic acids are NOT DETECTED.  The SARS-CoV-2 RNA is generally detectable in upper respiratoy specimens during the acute phase of infection. The lowest concentration of SARS-CoV-2 viral copies this assay can detect is 131 copies/mL. A negative result does not preclude SARS-Cov-2 infection and should not be used as the sole basis for treatment or other patient management decisions. A negative result may occur with  improper specimen collection/handling, submission of specimen other than nasopharyngeal swab, presence of viral mutation(s) within the areas targeted by this assay, and inadequate number of viral copies (<131 copies/mL). A negative result must be combined with clinical observations, patient history, and epidemiological information. The expected result is Negative.  Fact Sheet for Patients:  https://www.moore.com/  Fact Sheet for Healthcare Providers:  https://www.young.biz/  This test is no t yet approved or cleared by the [place] FDA and  has been authorized for detection and/or diagnosis of SARS-CoV-2 by FDA under an Emergency Use Authorization (EUA). This EUA will remain  in effect (meaning this test can be used) for the duration of the COVID-19 declaration under Section 564(b)(1) of the Act, 21 U.S.C. section 360bbb-3(b)(1), unless the  authorization is terminated or revoked sooner.     Influenza A by PCR NEGATIVE NEGATIVE Final   Influenza B by PCR NEGATIVE NEGATIVE Final    Comment: (NOTE) The Xpert Xpress SARS-CoV-2/FLU/RSV assay is intended as an aid in  the diagnosis of influenza from Nasopharyngeal swab specimens and  should not be used as a sole basis for treatment. Nasal washings and  aspirates are unacceptable for Xpert Xpress SARS-CoV-2/FLU/RSV  testing.  Fact Sheet for Patients: https://www.moore.com/  Fact Sheet for Healthcare Providers: https://www.young.biz/  This test is  not yet approved or cleared by the [place] and  has been authorized for detection and/or diagnosis of SARS-CoV-2 by  FDA under an Emergency Use Authorization (EUA). This EUA will remain  in effect (meaning this test can be used) for the duration of the  Covid-19 declaration under Section 564(b)(1) of the Act, 21  U.S.C. section 360bbb-3(b)(1), unless the authorization is  terminated or revoked. Performed at [facility], [address] [place], [place] [address]   Aerobic Culture (superficial specimen)     Status: None   Collection Time: 06/04/20 10:53 PM   Specimen: Leg; Wound  Result Value Ref Range Status   Specimen Description   Final    LEG LEFT Performed at [facility], [address], [place], [place] [address]    Special Requests   Final    NONE Performed at [facility], [address] [place], [place] [address]    Gram Stain   Final    RARE WBC PRESENT, PREDOMINANTLY PMN NO ORGANISMS SEEN    Culture   Final    NO GROWTH 2 DAYS Performed at [facility], [address], [place], [place] [address]    Report Status 06/07/2020 FINAL  Final         Radiology Studies: No results found.      Scheduled Meds:  enoxaparin (LOVENOX) injection  0.5 mg/kg Subcutaneous Q24H   furosemide  40 mg Intravenous Once    hydrOXYzine  100 mg Oral QHS   lidocaine  1 patch Transdermal Q24H   linezolid  600 mg Oral Q12H   polyethylene glycol  34 g Oral Q2H   senna-docusate  1 tablet Oral BID   Continuous Infusions:  cefTRIAXone (ROCEPHIN)  IV 2 g (06/07/20 1322)     LOS: 7 days   Darlin Priestly, MD [facility] Pager [phone]  If 7PM-7AM, please contact night-coverage 06/07/2020, 4:10 PM

## 2020-06-07 NOTE — Progress Notes (Signed)
Ch attempted visit. Pt using restroom. Will attempt visit later.

## 2020-06-07 NOTE — Progress Notes (Signed)
Formatting of this note is different from the original.  [facility] INFECTIOUS DISEASE PROGRESS NOTE  Date of Admission:  05/31/2020       ID: Thomas Browning is a 54 y.o. male with LE cellulitis  Principal Problem:    Left leg cellulitis  Active Problems:    Hypertension    Sepsis (HCC)    Hypokalemia    AKI (acute kidney injury) (HCC)    Cellulitis of left leg    Subjective:  WBC down, low grade fevers. Had debridement of superficial skin by vascular. Had MRI done 10.30.  ROS   Eleven systems are reviewed and negative except per hpi    Medications:   Antibiotics Given (last 72 hours)     Date/Time Action Medication Dose Rate    06/04/20 2244 New Bag/Given    linezolid (ZYVOX) IVPB 600 mg 600 mg 300 mL/hr    06/05/20 1424 New Bag/Given    cefTRIAXone (ROCEPHIN) 2 g in sodium chloride 0.9 % 100 mL IVPB 2 g 200 mL/hr    06/05/20 1821 New Bag/Given    linezolid (ZYVOX) IVPB 600 mg 600 mg 300 mL/hr    06/05/20 2208 New Bag/Given    linezolid (ZYVOX) IVPB 600 mg 600 mg 300 mL/hr    06/06/20 1047 New Bag/Given    linezolid (ZYVOX) IVPB 600 mg 600 mg 300 mL/hr    06/06/20 1248 New Bag/Given    cefTRIAXone (ROCEPHIN) 2 g in sodium chloride 0.9 % 100 mL IVPB 2 g 200 mL/hr    06/06/20 2105 New Bag/Given    linezolid (ZYVOX) IVPB 600 mg 600 mg 300 mL/hr    06/07/20 1308 New Bag/Given    linezolid (ZYVOX) IVPB 600 mg 600 mg 300 mL/hr       ? enoxaparin (LOVENOX) injection  0.5 mg/kg Subcutaneous Q24H   ? hydrOXYzine  100 mg Oral QHS   ? lidocaine  1 patch Transdermal Q24H   ? polyethylene glycol  34 g Oral Q2H   ? senna-docusate  1 tablet Oral BID     Objective:  Vital signs in last 24 hours:  Temp:  [97.9 F (36.6 C)-100.1 F (37.8 C)] 98.6 F (37 C) (11/01 0725)  Pulse Rate:  [86-94] 86 (11/01 0725)  Resp:  [17-18] 17 (11/01 0725)  BP: (123-135)/(67-85) 123/75 (11/01 0725)  SpO2:  [97 %-98 %] 97 % (11/01 0725)  Physical Exam   Constitutional: He is oriented to person, place, and time. obese  Cardiovascular: Normal  rate, regular rhythm and normal heart sounds.Pulmonary/Chest: Effort normal and breath sounds normal. No respiratory distress. He has no wheezes.   Abdominal: Soft. Bowel sounds are normal. He exhibits no distension. There is no tenderness. Lymphadenopathy: He has no cervical adenopathy.   Neurological: He is alert and oriented to person, place, and time.   Skin:LLE  extensive denuded skin but lesions are dry and no drainage  Psychiatric: He has a normal mood and affect. His behavior is normal.     Lab Results  Recent Labs     06/06/20  0332 06/07/20  0424   WBC 20.6* 17.3*   HGB 13.5 12.6*   HCT 41.2 37.3*   NA 134* 132*   K 4.1 3.9   CL 98 98   CO2 27 25   BUN 18 16   CREATININE 1.40* 1.18     Microbiology:  Results for orders placed or performed during the hospital encounter of 05/31/20   Blood culture (routine x  2)     Status: None    Collection Time: 05/31/20 10:14 AM    Specimen: BLOOD   Result Value Ref Range Status    Specimen Description BLOOD LEFT ASSIST CONTROL  Final    Special Requests   Final     BOTTLES DRAWN AEROBIC AND ANAEROBIC Blood Culture results may not be optimal due to an excessive volume of blood received in culture bottles    Culture   Final     NO GROWTH 5 DAYS  Performed at [facility], [address], [place], [place] [address]     Report Status 06/05/2020 FINAL  Final   Blood culture (routine x 2)     Status: None    Collection Time: 05/31/20  1:48 PM    Specimen: BLOOD   Result Value Ref Range Status    Specimen Description BLOOD BLOOD LEFT FOREARM  Final    Special Requests   Final     BOTTLES DRAWN AEROBIC AND ANAEROBIC Blood Culture adequate volume    Culture   Final     NO GROWTH 5 DAYS  Performed at [facility], [address] [place], [place] [address]     Report Status 06/05/2020 FINAL  Final   Respiratory Panel by RT PCR (Flu A&B, Covid) - Nasopharyngeal Swab     Status: None    Collection Time: 05/31/20  3:02 PM    Specimen: Nasopharyngeal Swab   Result  Value Ref Range Status    SARS Coronavirus 2 by RT PCR NEGATIVE NEGATIVE Final     Comment: (NOTE)  SARS-CoV-2 target nucleic acids are NOT DETECTED.    The SARS-CoV-2 RNA is generally detectable in upper respiratoy  specimens during the acute phase of infection. The lowest  concentration of SARS-CoV-2 viral copies this assay can detect is  131 copies/mL. A negative result does not preclude SARS-Cov-2  infection and should not be used as the sole basis for treatment or  other patient management decisions. A negative result may occur with   improper specimen collection/handling, submission of specimen other  than nasopharyngeal swab, presence of viral mutation(s) within the  areas targeted by this assay, and inadequate number of viral copies  (<131 copies/mL). A negative result must be combined with clinical  observations, patient history, and epidemiological information. The  expected result is Negative.    Fact Sheet for Patients:   https://www.moore.com/    Fact Sheet for Healthcare Providers:   https://www.young.biz/    This test is no t yet approved or cleared by the [place] FDA and   has been authorized for detection and/or diagnosis of SARS-CoV-2 by  FDA under an Emergency Use Authorization (EUA). This EUA will remain   in effect (meaning this test can be used) for the duration of the  COVID-19 declaration under Section 564(b)(1) of the Act, 21 U.S.C.  section 360bbb-3(b)(1), unless the authorization is terminated or  revoked sooner.       Influenza A by PCR NEGATIVE NEGATIVE Final    Influenza B by PCR NEGATIVE NEGATIVE Final     Comment: (NOTE)  The Xpert Xpress SARS-CoV-2/FLU/RSV assay is intended as an aid in   the diagnosis of influenza from Nasopharyngeal swab specimens and   should not be used as a sole basis for treatment. Nasal washings and   aspirates are unacceptable for Xpert Xpress SARS-CoV-2/FLU/RSV   testing.    Fact Sheet for  Patients:  https://www.moore.com/    Fact  Sheet for Healthcare Providers:  https://www.young.biz/    This test is not yet approved or cleared by the [place] and   has been authorized for detection and/or diagnosis of SARS-CoV-2 by   FDA under an Emergency Use Authorization (EUA). This EUA will remain   in effect (meaning this test can be used) for the duration of the   Covid-19 declaration under Section 564(b)(1) of the Act, 21   U.S.C. section 360bbb-3(b)(1), unless the authorization is   terminated or revoked.  Performed at [facility], [address] [place],  [place] [address]    Aerobic Culture (superficial specimen)     Status: None    Collection Time: 06/04/20 10:53 PM    Specimen: Leg; Wound   Result Value Ref Range Status    Specimen Description   Final     LEG LEFT  Performed at [facility], [address], [place], [place] [address]     Special Requests   Final     NONE  Performed at [facility], [address] [place], [place] [address]     Gram Stain   Final     RARE WBC PRESENT, PREDOMINANTLY PMN  NO ORGANISMS SEEN     Culture   Final     NO GROWTH 2 DAYS  Performed at [facility], [address] [place], [place] [address]     Report Status 06/07/2020 FINAL  Final     Studies/Results:  IMPRESSION:  1. Persistent diffuse and marked subcutaneous soft tissue  swelling/edema/fluid similar to prior study. There are numerous skin  lesions, likely blisters.  2. In the upper and mid tibia regions anteriorly and medially there  are ill-defined subcutaneous fluid collections which are not well  organized. These could be a developing abscesses. No well-formed rim  enhancing drainable abscess is identified.  3. No MR findings for myofasciitis or pyomyositis.  4. No findings suspicious for septic arthritis or osteomyelitis.      Electronically Signed    By: Rudie Meyer M.D.    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 He was doing a long drive to [place] when he noted the left leg was swelling 5 days before- He poured some rubbng alcohol on it. He drove back to [place] and  On Saturday noted the leg was red and weeping .came to the ED on Monday  He had some chills , He does not remember any trauma. No animal or insect bites, no fishing, walks with flip flops and hence could have stepped in dirty water  On admit wbc 33. Started on ceftriaxone, and vanco then changed to linezolid.   06/07/20 WBC down to 17. Bcx neg, leg cx from 10/29 neg.     Recommendations  Cont ceftriaxone and linezolid.   Pending culture from wound 10/29 - NGTD.  Encouraged elevation on 6 pillows.   Discussed with Dr Fran Lowes. Will continue diuresis as well.  Will likely need a few more days IV then transition to oral keflex if no other organisms grow. Will likely need prolonged abx and unnaboot placement at dc.    Thank you very much for the consult. Will follow with you.    Mick Sell    06/07/2020, 12:51 PM  Electronically signed by Mick Sell, MD at 06/07/2020  1:09 PM EDT

## 2020-06-07 NOTE — Progress Notes (Signed)
Formatting of this note might be different from the original.  Ch attempted visit. Pt using restroom. Will attempt visit later.  Electronically signed by Tonia Brooms, Chaplain at 06/07/2020  1:04 PM EDT

## 2020-06-07 NOTE — Progress Notes (Signed)
Formatting of this note might be different from the original.  PT Cancellation Note    Patient Details  Name: Thomas Browning  MRN: [id number]  DOB: [birth year]    Cancelled Treatment:    Reason Eval/Treat Not Completed: Other (comment).  Pt requesting to sleep at this time and declining physical therapy session.  Will re-attempt PT session at a later date/time as able.    Hendricks Limes, PT  06/07/20, 9:51 AM      Electronically signed by Hendricks Limes, PT at 06/07/2020  9:51 AM EDT

## 2020-06-07 NOTE — Progress Notes (Signed)
Formatting of this note might be different from the original.   Ch visited with Pt. Pt requested prayer for healing. Pt tearful during prayer. Supported by mother. Ch said she will continue to pray for him. Pt grateful for visit.  Electronically signed by Tonia Brooms, Chaplain at 06/07/2020  4:33 PM EDT

## 2020-06-07 NOTE — Progress Notes (Signed)
Formatting of this note is different from the original.  Images from the original note were not included.    PROGRESS NOTE    MELROSE SANDGREN  MWU:[id number] DOB: Jan 10, 1966 DOA: 05/31/2020  PCP: Patient, No Pcp Per   Brief Narrative:   54 y.o. male with medical history significant of HTN, who presents with left leg pain, swelling, weeping.    Patient states that his left leg pain started approximately 5 days ago, which has been progressively worsening.  Left leg is erythematous, swelling, with significant blistering, open wound and weeping from several popped blisters. The pain is constant, sharp, moderate to severe, nonradiating. 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  Ultrasound on admission negative.    Patient presented with left leg complaints also complains of constipation neck pain.  Started on broad-spectrum IV antibiotics on admission.    Patient's main complaints are constipation and neck pain.  KUB negative for obstruction.  X-ray C-spine negative for fracture.    Assessment & Plan:    Principal Problem:    Left leg cellulitis  Active Problems:    Hypertension    Sepsis (HCC)    Hypokalemia    AKI (acute kidney injury) (HCC)    Cellulitis of left leg    Severe Sepsis due to   left leg cellulitis:   Patient meets criteria for sepsis with leukocytosis with WBC 33.3 and tachycardia with heart rate 100 -->95.  with AKI.  Lactate is normal, currently hemodynamically stable.   Left lower extremity venous Doppler is negative for DVT  Marked leukocytosis  Elevated inflammatory markers.  --MR showed "Findings of diffuse cellulitis with superficial cyst. Non loculated fluid within the fascial layers. No evidence of osteomyelitis or abscess."  --started on vanc/ceftriaxone, with IV clinda added later then d/c'ed again.  Vanc switched to Linezolid.  --ID consulted, collected fluid from blister for culture, NGTD.  --repeat MRI LLE 10/29 ruled out fascitis and show no drainable fluid  collection.  --Vascular surgery performed local debridement at bedside 10/30, signed off  Plan:  --cont ceftriaxone and Linezolid (switched to oral), per ID  --elevation on 6 pillows.  --wound care and dressing change per order, every other day  --referral to wound care center at at discharge    # Severe LLE edema  # possible lymphedema  --LLE edema worsened with development of a layer of edema with multiple blisters, likely due to IVF given since presentation.  --MIVF d/c'ed on 10/27.  Had been receiving IV lasix 40 mg BID.  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    BP acceptable.  --IV hydralazine PRN    Hypokalemia:  --replete PRN    AKI (acute kidney injury) (HCC)  Creatinine 1.49, BUN 25, no baseline creatinine available,   may be due to dehydration versus sepsis.  --Cr improved to 1.03 after IVF and tx of cellulitis     Cervicalgia  Patient is a truck driver  Exam significant  for rigid lump in posterior neck  Noted to soft tissue on x-ray  C-spine x-ray negative for fracture  Plan:  --cont lidocaine patch    Morbid obesity  BMI 41.8  This complicates care and prognosis    Hyperglycemia  Pre-diabetes  --No hx of DM2.  A1c 6.3.  --BG within inpatient goal.  No need for fingersticks or SSI.    Thrombocythemia   --plt normal on presentation, now trending up.  Likely reactive.  --trend for now    DVT prophylaxis: Lovenox  Code Status: Full  Family Communication:    Status is: Inpatient    Dispo: The patient is from: Home                Anticipated d/c is to: Home               Anticipated d/c date is: 2-3 days               Patient currently is not medically stable to d/c.  Severe sepsis 2/2 cellulitis, currently on IV abx, per ID rec.    Consultants:    None    Procedures:    None    Antimicrobials:    Vancomycin   Ceftriaxone   Clindamycin    Subjective:  Doing ok, less pain in his leg.  Has been able to be up and going to the bathroom.      Vascular surgery signed off.  Rec outpatient wound care center.    Objective:  Vitals:    06/06/20 1506 06/06/20 2340 06/07/20 0725 06/07/20 1603   BP: 135/85 123/67 123/75 125/71   Pulse: 87 94 86 90   Resp: 17 18 17 16    Temp: 97.9 F (36.6 C) 100.1 F (37.8 C) 98.6 F (37 C) 99.2 F (37.3 C)   TempSrc: Oral Oral Oral Oral   SpO2: 98% 97% 97% 99%   Weight:       Height:         Intake/Output Summary (Last 24 hours) at 06/07/2020 1610  Last data filed at 06/07/2020 0549  Gross per 24 hour   Intake 1007.8 ml   Output 1150 ml   Net -142.2 ml     Filed Weights    05/31/20 1004   Weight: 136.1 kg     Examination:    Constitutional: NAD, AAOx3  HEENT: conjunctivae and lids normal, EOMI  CV: No cyanosis.    RESP: normal respiratory effort, on RA  Extremities: RLE no erythema no tenderness.    Neuro: II - XII grossly intact.    Psych: Normal mood and affect.  Appropriate judgement and reason    05/31/20    06/02/20    06/05/2020, post local debridement    Data Reviewed: I have personally reviewed following labs and imaging studies    CBC:  Recent Labs   Lab 06/03/20  0526 06/04/20  0502 06/05/20  0431 06/06/20  0332 06/07/20  0424   WBC 30.0* 27.6* 24.4* 20.6* 17.3*   HGB 12.3* 13.8 13.3 13.5 12.6*   HCT 36.8* 40.7 39.8 41.2 37.3*   MCV 87.8 87.5 86.9 87.7 87.1   PLT 356 424* 455* 508* 517*     Basic Metabolic Panel:  Recent Labs   Lab 06/03/20  0526 06/04/20  0502 06/05/20  0431 06/06/20  0332 06/07/20  0424   NA 136 132* 131* 134* 132*   K 4.4 3.9 3.8 4.1 3.9   CL 103 95* 98  98 98   CO2 24 25 25 27 25     GLUCOSE 106* 113* 127* 141* 113*   BUN 14 15 18 18 16    CREATININE 1.10 1.30* 1.32* 1.40* 1.18   CALCIUM 7.7* 7.9* 7.8* 8.0* 7.7*   MG 2.3 2.3 2.2 2.2 2.2     GFR:  Estimated Creatinine Clearance: 100.8 mL/min (by C-G formula based on SCr of 1.18 mg/dL).  Liver Function Tests:  No results for input(s): AST, ALT, ALKPHOS, BILITOT, PROT, ALBUMIN in the last 168 hours.  No results for input(s): LIPASE, AMYLASE in the last 168 hours.  No results for input(s): AMMONIA in the last 168 hours.  Coagulation Profile:  No results for input(s): INR, PROTIME in the last 168 hours.  Cardiac Enzymes:  No results for input(s): CKTOTAL, CKMB, CKMBINDEX, TROPONINI in the last 168 hours.  BNP (last 3 results)  No results for input(s): PROBNP in the last 8760 hours.  HbA1C:  Recent Labs     06/05/20  0431   HGBA1C 6.3*     CBG:  No results for input(s): GLUCAP in the last 168 hours.  Lipid Profile:  No results for input(s): CHOL, HDL, LDLCALC, TRIG, CHOLHDL, LDLDIRECT in the last 72 hours.  Thyroid Function Tests:  No results for input(s): TSH, T4TOTAL, FREET4, T3FREE, THYROIDAB in the last 72 hours.  Anemia Panel:  No results for input(s): VITAMINB12, FOLATE, FERRITIN, TIBC, IRON, RETICCTPCT in the last 72 hours.  Sepsis Labs:  No results for input(s): PROCALCITON, LATICACIDVEN in the last 168 hours.    Recent Results (from the past 240 hour(s))   Blood culture (routine x 2)     Status: None    Collection Time: 05/31/20 10:14 AM    Specimen: BLOOD   Result Value Ref Range Status    Specimen Description BLOOD LEFT ASSIST CONTROL  Final    Special Requests   Final     BOTTLES DRAWN AEROBIC AND ANAEROBIC Blood Culture results may not be optimal due to an excessive volume of blood received in culture bottles    Culture   Final     NO GROWTH 5 DAYS  Performed at [facility], [address] [place], [place] [address]     Report Status 06/05/2020 FINAL  Final   Blood culture (routine x 2)     Status: None    Collection Time:  05/31/20  1:48 PM    Specimen: BLOOD   Result Value Ref Range Status    Specimen Description BLOOD BLOOD LEFT FOREARM  Final    Special Requests   Final     BOTTLES DRAWN AEROBIC AND ANAEROBIC Blood Culture adequate volume    Culture   Final     NO GROWTH 5 DAYS  Performed at [facility], [address] [place], [place] [address]     Report Status 06/05/2020 FINAL  Final   Respiratory Panel by RT PCR (Flu A&B, Covid) - Nasopharyngeal Swab     Status: None    Collection Time: 05/31/20  3:02 PM    Specimen: Nasopharyngeal Swab   Result Value Ref Range Status    SARS Coronavirus 2 by RT PCR NEGATIVE NEGATIVE Final     Comment: (NOTE)  SARS-CoV-2 target nucleic acids are NOT DETECTED.    The SARS-CoV-2 RNA is generally detectable in upper respiratoy  specimens during the acute phase of infection. The lowest  concentration of SARS-CoV-2 viral copies this assay can detect is  131  copies/mL. A negative result does not preclude SARS-Cov-2  infection and should not be used as the sole basis for treatment or  other patient management decisions. A negative result may occur with   improper specimen collection/handling, submission of specimen other  than nasopharyngeal swab, presence of viral mutation(s) within the  areas targeted by this assay, and inadequate number of viral copies  (<131 copies/mL). A negative result must be combined with clinical  observations, patient history, and epidemiological information. The  expected result is Negative.    Fact Sheet for Patients:   https://www.moore.com/    Fact Sheet for Healthcare Providers:   https://www.young.biz/    This test is no t yet approved or cleared by the [place] FDA and   has been authorized for detection and/or diagnosis of SARS-CoV-2 by  FDA under an Emergency Use Authorization (EUA). This EUA will remain   in effect (meaning this test can be used) for the duration of the  COVID-19 declaration under Section  564(b)(1) of the Act, 21 U.S.C.  section 360bbb-3(b)(1), unless the authorization is terminated or  revoked sooner.       Influenza A by PCR NEGATIVE NEGATIVE Final    Influenza B by PCR NEGATIVE NEGATIVE Final     Comment: (NOTE)  The Xpert Xpress SARS-CoV-2/FLU/RSV assay is intended as an aid in   the diagnosis of influenza from Nasopharyngeal swab specimens and   should not be used as a sole basis for treatment. Nasal washings and   aspirates are unacceptable for Xpert Xpress SARS-CoV-2/FLU/RSV   testing.    Fact Sheet for Patients:  https://www.moore.com/    Fact Sheet for Healthcare Providers:  https://www.young.biz/    This test is not yet approved or cleared by the [place] FDA and   has been authorized for detection and/or diagnosis of SARS-CoV-2 by   FDA under an Emergency Use Authorization (EUA). This EUA will remain   in effect (meaning this test can be used) for the duration of the   Covid-19 declaration under Section 564(b)(1) of the Act, 21   U.S.C. section 360bbb-3(b)(1), unless the authorization is   terminated or revoked.  Performed at [facility], [address] [place],  [place] [address]    Aerobic Culture (superficial specimen)     Status: None    Collection Time: 06/04/20 10:53 PM    Specimen: Leg; Wound   Result Value Ref Range Status    Specimen Description   Final     LEG LEFT  Performed at [facility], [address] [place], [place] [address]     Special Requests   Final     NONE  Performed at [facility], [address] [place], [place] [address]     Gram Stain   Final     RARE WBC PRESENT, PREDOMINANTLY PMN  NO ORGANISMS SEEN     Culture   Final     NO GROWTH 2 DAYS  Performed at [facility], [address], [place], [place] [address]     Report Status 06/07/2020 FINAL  Final       Radiology Studies:  No results found.    Scheduled Meds:  ? enoxaparin (LOVENOX) injection  0.5 mg/kg Subcutaneous Q24H    ? furosemide  40 mg Intravenous Once   ? hydrOXYzine  100 mg Oral QHS   ? lidocaine  1 patch Transdermal Q24H   ? linezolid  600 mg Oral Q12H   ?  polyethylene glycol  34 g Oral Q2H   ? senna-docusate  1 tablet Oral BID     Continuous Infusions:  ? cefTRIAXone (ROCEPHIN)  IV 2 g (06/07/20 1322)      LOS: 7 days     Darlin Priestly, MD  [facility]  Pager [phone]    If 7PM-7AM, please contact night-coverage  06/07/2020, 4:10 PM   Electronically signed by Darlin Priestly, MD at 06/07/2020  5:25 PM EDT

## 2020-06-08 LAB — CBC
HCT: 36.3 % — ABNORMAL LOW (ref 39.0–52.0)
Hemoglobin: 12.3 g/dL — ABNORMAL LOW (ref 13.0–17.0)
MCH: 29.8 pg (ref 26.0–34.0)
MCHC: 33.9 g/dL (ref 30.0–36.0)
MCV: 87.9 fL (ref 80.0–100.0)
Platelets: 582 10*3/uL — ABNORMAL HIGH (ref 150–400)
RBC: 4.13 MIL/uL — ABNORMAL LOW (ref 4.22–5.81)
RDW: 14.5 % (ref 11.5–15.5)
WBC: 14.3 10*3/uL — ABNORMAL HIGH (ref 4.0–10.5)
nRBC: 0 % (ref 0.0–0.2)

## 2020-06-08 LAB — BASIC METABOLIC PANEL
Anion gap: 9 (ref 5–15)
BUN: 14 mg/dL (ref 6–20)
CO2: 24 mmol/L (ref 22–32)
Calcium: 8 mg/dL — ABNORMAL LOW (ref 8.9–10.3)
Chloride: 97 mmol/L — ABNORMAL LOW (ref 98–111)
Creatinine, Ser: 1.17 mg/dL (ref 0.61–1.24)
GFR, Estimated: >60 mL/min (ref >60)
Glucose, Bld: 112 mg/dL — ABNORMAL HIGH (ref 70–99)
Potassium: 3.9 mmol/L (ref 3.5–5.1)
Sodium: 130 mmol/L — ABNORMAL LOW (ref 135–145)

## 2020-06-08 LAB — MAGNESIUM: Magnesium: 2.4 mg/dL (ref 1.7–2.4)

## 2020-06-08 MED ORDER — FUROSEMIDE 10 MG/ML IJ SOLN
40.0000 mg | Freq: Two times a day (BID) | INTRAMUSCULAR | Status: DC
  Administered 2020-06-08 – 2020-06-11 (×8): 40 mg via INTRAVENOUS
  Filled 2020-06-08 (×8): qty 4

## 2020-06-08 MED ORDER — OXYCODONE HCL 5 MG PO TABS
5.0000 mg | ORAL_TABLET | Freq: Three times a day (TID) | ORAL | Status: DC | PRN
  Administered 2020-06-08 – 2020-06-15 (×10): 5 mg via ORAL
  Filled 2020-06-08 (×11): qty 1

## 2020-06-08 MED ORDER — TRAZODONE HCL 50 MG PO TABS
50.0000 mg | ORAL_TABLET | Freq: Every day | ORAL | Status: DC
  Administered 2020-06-08 – 2020-06-14 (×7): 50 mg via ORAL
  Filled 2020-06-08 (×7): qty 1

## 2020-06-08 NOTE — Consult Note (Signed)
WOC Nurse Consult Note: Reason for Consult:Paitient is followed by vascular surgery and received bedside debridement   MD provided orders for topical wound care and bedside RN to perform.   Wound type: venous insufficiency Pressure Injury POA: NA Measurement: Skin is degloved from knee down to left leg.  Wound KGS:UPJSR red Drainage (amount, consistency, odor) moderate serosanguinous  No odor.  Periwound:edema and tenderness Dressing procedure/placement/frequency:Bedside RN to perform.  Cleanse left leg with NS and pat dry. Apply Aquacel Ag to open wounds (MAy require 4-5 sheets) LAWSON # [id number]) .  Wrap leg with kerlix from below toes to below knee. Secure with ace wrap. Change daily.  Will not follow at this time.  Please re-consult if needed.  Stephen Hudson MSN, RN, FNP-BC CWON Wound, Ostomy, Continence Nurse Pager [phone]

## 2020-06-08 NOTE — Plan of Care (Signed)
Pt progressing towards goal, VSS, PRN pain mediation given last night. Left leg elevated on several pillows as ordered. Pt had inevitable night.

## 2020-06-08 NOTE — Progress Notes (Signed)
ID  Doing better Left leg less swollen less painful Vascular surgery deroofed all the blisters    awake and alert O/E  Patient Vitals for the past 24 hrs:  BP Temp Temp src Pulse Resp SpO2  06/08/20 1158 128/77 98.4 F (36.9 C) Oral 83 16 97 %  06/08/20 0725 127/79 98.7 F (37.1 C) Oral 82 16 97 %  06/08/20 0003 110/65 99.1 F (37.3 C) Oral 87 18 96 %  06/07/20 1603 125/71 99.2 F (37.3 C) Oral 90 16 99 %   Chest CTA Hss1s2 abd soft Left leg            CBC Latest Ref Rng & Units 06/08/2020 06/07/2020 06/06/2020  WBC 4.0 - 10.5 K/uL 14.3(H) 17.3(H) 20.6(H)  Hemoglobin 13.0 - 17.0 g/dL 12.3(L) 12.6(L) 13.5  Hematocrit 39 - 52 % 36.3(L) 37.3(L) 41.2  Platelets 150 - 400 K/uL 582(H) 517(H) 508(H)    CMP Latest Ref Rng & Units 06/08/2020 06/07/2020 06/06/2020  Glucose 70 - 99 mg/dL 161(W) 960(A) 540(J)  BUN 6 - 20 mg/dL 14 16 18   Creatinine 0.61 - 1.24 mg/dL 8.11 9.14)  Sodium 135 - 145 mmol/L 130(L) 132(L) 134(L)  Potassium 3.5 - 5.1 mmol/L 3.9 3.9 4.1  Chloride 98 - 111 mmol/L 97(L) 98 98  CO2 22 - 32 mmol/L 24 25 27   Calcium 8.9 - 10.3 mg/dL 8.0(L) 7.7(L) 8.0(L)  Total Protein 6.5 - 8.1 g/dL - - -  Total Bilirubin 0.3 - 1.2 mg/dL - - -  Alkaline Phos 38 - 126 U/L - - -  AST 15 - 41 U/L - - -  ALT 0 - 44 U/L - - -    Impression/reommendation ? Severe cellulitis of the left leg with severe edema and blisters No nec facitis Common organism are strep and staph Currently on linezolid and ceftriaxone Will Dc both and change to cefazolin  Leucocytosis improing He needs to keep the leg elevated on 4 pillows   ?HTN- on frusemide to reduce the swelling   Severe unilateral edema-  ? Possible obstructive sleep apnea May do 2 d echo to assess his Cardiac status /pulmonary HTn   Discussed the management with patient

## 2020-06-08 NOTE — Progress Notes (Addendum)
Stephen [name], Stephen [name]  Brief Narrative:  54 y.o. male with medical history significant of HTN, who presents with left leg pain, swelling, weeping.  [name] states that his left leg pain started approximately 5 days ago, which has been progressively worsening.  Left leg is erythematous, swelling, with significant blistering, open wound and weeping from several popped blisters. The pain is constant, sharp, moderate to severe, nonradiating. 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  Ultrasound on admission negative.  [name] presented with left leg complaints also complains of constipation neck pain.  Started on broad-spectrum IV antibiotics on admission.  [name]'s main complaints are constipation and neck pain.  KUB negative for obstruction.  X-ray C-spine negative for fracture.    Assessment & Plan:   Principal Problem:   Left leg cellulitis Active Problems:   Hypertension   Sepsis (HCC)   Hypokalemia   AKI (acute kidney injury) (HCC)   Cellulitis of left leg  Severe Sepsis due to  left leg cellulitis:  [name] meets criteria for sepsis with leukocytosis with WBC 33.3 and tachycardia with heart rate 100 -->95.  with AKI. Lactate is normal, currently hemodynamically stable.  Left lower extremity venous Doppler is negative for DVT Marked leukocytosis Elevated inflammatory markers. --MR showed "Findings of diffuse cellulitis with superficial cyst. Non loculated fluid within the fascial layers. Stephen evidence of osteomyelitis or abscess." --started on vanc/ceftriaxone, with IV clinda added later then d/c'ed again.  Vanc switched to Linezolid. --ID consulted, collected fluid from blister for culture, NGTD. --repeat MRI LLE 10/29 ruled out fascitis and showed Stephen drainable fluid collection. --Vascular surgery performed local debridement at bedside 10/30, signed off --TOC attempted to  make referral to outpatient wound care center, however, long wait time. Plan: --switch abx to cefazolin today, [name] ID --elevation on 6 pillows. --wound care consult today to clean up and solidify dressing change instruction. --Will follow up with outpatient vascular clinic after discharge  # Severe LLE edema # possible lymphedema --LLE edema worsened with development of a layer of edema with multiple blisters, likely due to IVF given since presentation. --MIVF d/c'ed on 10/27.  Had been receiving IV lasix 40 mg BID.  Held on 10/31 due to Cr increase, resumed on 11/1. 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   BP acceptable. --IV hydralazine PRN  Hypokalemia: --replete PRN  AKI (acute kidney injury) (HCC) Creatinine 1.49, BUN 25, Stephen baseline creatinine available,  may be due to dehydration versus sepsis. 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  A1c 6.3. --BG within inpatient goal.  Stephen need for fingersticks or SSI.  Thrombocythemia  --plt normal on presentation, now trending up.  Likely reactive. --trend for  now   DVT prophylaxis: Lovenox Code Status: Full Family Communication:   Status is: Inpatient   Dispo: The [name] is from: Home              Anticipated d/c is to: Home              Anticipated d/c date is: 2-3 days              [name] currently is not medically stable to d/c.  Severe sepsis 2/2 cellulitis, currently on IV abx, [name] ID rec.  Consultants:   None  Procedures:   None  Antimicrobials:   Vancomycin  Ceftriaxone  Clindamycin   Subjective: Leg pain controlled.  Having BM's but still felt constipated.  Voiding well in response to diuretics.    Wound care consulted today.   Objective: Vitals:   06/08/20 0003 06/08/20 0725 06/08/20 1158 06/08/20 1611  BP: 110/65 127/79 128/77 133/79  Pulse: 87 82 83 87  Resp: 18 16 16 16   Temp: 99.1 F (37.3 C) 98.7 F (37.1 C) 98.4 F (36.9 C) 98.1 F (36.7 C)  TempSrc: Oral Oral Oral Oral  SpO2: 96% 97% 97% 98%  Weight:      Height:        Intake/Output Summary (Last 24 hours) at 06/08/2020 1733 Last data filed at 06/08/2020 1548 Gross [name] 24 hour  Intake 720 ml  Output 4575 ml  Net -3855 ml   Filed Weights   05/31/20 1004  Weight: 136.1 kg    Examination:  Constitutional: NAD, AAOx3 HEENT: conjunctivae and lids normal, EOMI CV: Stephen cyanosis.   RESP: normal respiratory effort, on RA Extremities: Stephen effusions, edema in RLE, LLE similar to most recent photo below.   SKIN: warm.  LLE skin starting to scab. Neuro: II - XII grossly intact.   Psych: Normal mood and affect.  Appropriate judgement and reason    05/31/20    06/02/20    06/05/2020, post local debridement    06/08/20     Data Reviewed: I have personally reviewed following labs and imaging studies  CBC: Recent Labs  Lab 06/04/20 0502 06/05/20 0431 06/06/20 0332 06/07/20 0424 06/08/20 0425  WBC 27.6* 24.4* 20.6* 17.3* 14.3*  HGB 13.8 13.3 13.5 12.6* 12.3*  HCT 40.7 39.8 41.2 37.3* 36.3*  MCV 87.5 86.9 87.7 87.1 87.9   PLT 424* 455* 508* 517* 582*   Basic Metabolic Panel: Recent Labs  Lab 06/04/20 0502 06/05/20 0431 06/06/20 0332 06/07/20 0424 06/08/20 0425  NA 132* 131* 134* 132* 130*  K 3.9 3.8 4.1 3.9 3.9  CL 95* 98 98 98 97*  CO2 25 25 27 25 24   GLUCOSE 113* 127* 141* 113* 112*  BUN 15 18 18 16 14   CREATININE 1.30* 1.32* 1.40* 1.18 1.17  CALCIUM 7.9* 7.8* 8.0* 7.7* 8.0*  MG 2.3 2.2 2.2 2.2 2.4   GFR: Estimated Creatinine Clearance: 101.7 mL/min (by C-G formula based on SCr of 1.17 mg/dL). Liver Function Tests: Stephen results for input(s): AST, ALT, ALKPHOS, BILITOT, PROT, ALBUMIN in the last 168 hours. Stephen results for input(s): LIPASE, AMYLASE in the last 168 hours. Stephen results for input(s): AMMONIA in the last 168 hours. Coagulation Profile: Stephen results for input(s): INR, PROTIME in the last 168 hours. Cardiac Enzymes: Stephen results for input(s): CKTOTAL, CKMB, CKMBINDEX, TROPONINI in the last 168 hours. BNP (last 3 results) Stephen results for input(s):  PROBNP in the last 8760 hours. HbA1C: Stephen results for input(s): HGBA1C in the last 72 hours. CBG: Stephen results for input(s): GLUCAP in the last 168 hours. Lipid Profile: Stephen results for input(s): CHOL, HDL, LDLCALC, TRIG, CHOLHDL, LDLDIRECT in the last 72 hours. Thyroid Function Tests: Stephen results for input(s): TSH, T4TOTAL, FREET4, T3FREE, THYROIDAB in the last 72 hours. Anemia Panel: Stephen results for input(s): VITAMINB12, FOLATE, FERRITIN, TIBC, IRON, RETICCTPCT in the last 72 hours. Sepsis Labs: Stephen results for input(s): PROCALCITON, LATICACIDVEN in the last 168 hours.  Recent Results (from the past 240 hour(s))  Blood culture (routine x 2)     Status: None   Collection Time: 05/31/20 10:14 AM   Specimen: BLOOD  Result Value Ref Range Status   Specimen Description BLOOD LEFT ASSIST CONTROL  Final   Special Requests   Final    BOTTLES DRAWN AEROBIC AND ANAEROBIC Blood Culture results may not be optimal due to an excessive volume of blood  received in culture bottles   Culture   Final    Stephen GROWTH 5 DAYS Performed at [facility], [address] [place], [place] [address]    Report Status 06/05/2020 FINAL  Final  Blood culture (routine x 2)     Status: None   Collection Time: 05/31/20  1:48 PM   Specimen: BLOOD  Result Value Ref Range Status   Specimen Description BLOOD BLOOD LEFT FOREARM  Final   Special Requests   Final    BOTTLES DRAWN AEROBIC AND ANAEROBIC Blood Culture adequate volume   Culture   Final    Stephen GROWTH 5 DAYS Performed at [facility], [address] [place], [place] [address]    Report Status 06/05/2020 FINAL  Final  Respiratory Panel by RT PCR (Flu A&B, Covid) - Nasopharyngeal Swab     Status: None   Collection Time: 05/31/20  3:02 PM   Specimen: Nasopharyngeal Swab  Result Value Ref Range Status   SARS Coronavirus 2 by RT PCR NEGATIVE NEGATIVE Final    Comment: (NOTE) SARS-CoV-2 target nucleic acids are NOT DETECTED.  The SARS-CoV-2 RNA is generally detectable in upper respiratoy specimens during the acute phase of infection. The lowest concentration of SARS-CoV-2 viral copies this assay can detect is 131 copies/mL. A negative result does not preclude SARS-Cov-2 infection and should not be used as the sole basis for treatment or other [name] management decisions. A negative result may occur with  improper specimen collection/handling, submission of specimen other than nasopharyngeal swab, presence of viral mutation(s) within the areas targeted by this assay, and inadequate number of viral copies (<131 copies/mL). A negative result must be combined with clinical observations, [name] history, and epidemiological information. The expected result is Negative.  Fact Sheet for Patients:  https://www.moore.com/https://www.fda.gov/media/142436/download  Fact Sheet for Healthcare Providers:  https://www.young.biz/https://www.fda.gov/media/142435/download  This test is Stephen t yet approved or cleared by the [place] FDA and  has been authorized for detection and/or diagnosis of SARS-CoV-2 by FDA under an Emergency Use Authorization (EUA). This EUA will remain  in effect (meaning this test can be used) for the duration of the COVID-19 declaration under Section 564(b)(1) of the Act, 21 U.S.C. section 360bbb-3(b)(1), unless the authorization is terminated or revoked sooner.     Influenza A by PCR NEGATIVE NEGATIVE Final   Influenza B by PCR NEGATIVE NEGATIVE Final    Comment: (NOTE) The Xpert Xpress SARS-CoV-2/FLU/RSV assay is intended as an aid in  the diagnosis of influenza from  Nasopharyngeal swab specimens and  should not be used as a sole basis for treatment. Nasal washings and  aspirates are unacceptable for Xpert Xpress SARS-CoV-2/FLU/RSV  testing.  Fact Sheet for Patients: https://www.moore.com/  Fact Sheet for Healthcare Providers: https://www.young.biz/  This test is not yet approved or cleared by the [place] FDA and  has been authorized for detection and/or diagnosis of SARS-CoV-2 by  FDA under an Emergency Use Authorization (EUA). This EUA will remain  in effect (meaning this test can be used) for the duration of the  Covid-19 declaration under Section 564(b)(1) of the Act, 21  U.S.C. section 360bbb-3(b)(1), unless the authorization is  terminated or revoked. Performed at [facility], [address] [place], [place] [address]   Aerobic Culture (superficial specimen)     Status: None   Collection Time: 06/04/20 10:53 PM   Specimen: Leg; Wound  Result Value Ref Range Status   Specimen Description   Final    LEG LEFT Performed at [facility], [address], [place], [place] [address]    Special Requests   Final    NONE Performed at [facility], [address] [place], [place] [address]    Gram Stain   Final    RARE WBC PRESENT, PREDOMINANTLY PMN Stephen ORGANISMS SEEN    Culture   Final     Stephen GROWTH 2 DAYS Performed at [facility], [address], [place], [place] [address]    Report Status 06/07/2020 FINAL  Final      Radiology Studies: Stephen results found.      Scheduled Meds: . enoxaparin (LOVENOX) injection  0.5 mg/kg Subcutaneous Q24H  . furosemide  40 mg Intravenous BID  . hydrOXYzine  100 mg Oral QHS  . lidocaine  1 patch Transdermal Q24H  . linezolid  600 mg Oral Q12H  . senna-docusate  1 tablet Oral BID  . traZODone  50 mg Oral QHS   Continuous Infusions:    LOS: 8 days   Darlin Priestly, MD [facility] Pager [phone]  If 7PM-7AM, please contact night-coverage 06/08/2020, 5:33 PM

## 2020-06-08 NOTE — Unmapped (Signed)
Formatting of this note might be different from the original.  Pt progressing towards goal, VSS, PRN pain mediation given last night. Left leg elevated on several pillows as ordered. Pt had inevitable night.   Electronically signed by Rita Ohara, RN at 06/08/2020  7:27 AM EDT

## 2020-06-08 NOTE — Progress Notes (Signed)
Formatting of this note is different from the original.  Images from the original note were not included.  ID    Doing better  Left leg less swollen  less painful  Vascular surgery deroofed all the blisters      awake and alert  O/E   Patient Vitals for the past 24 hrs:   BP Temp Temp src Pulse Resp SpO2   06/08/20 1158 128/77 98.4 F (36.9 C) Oral 83 16 97 %   06/08/20 0725 127/79 98.7 F (37.1 C) Oral 82 16 97 %   06/08/20 0003 110/65 99.1 F (37.3 C) Oral 87 18 96 %   06/07/20 1603 125/71 99.2 F (37.3 C) Oral 90 16 99 %     Chest CTA  Hss1s2  abd soft  Left leg    CBC Latest Ref Rng & Units 06/08/2020 06/07/2020 06/06/2020   WBC 4.0 - 10.5 K/uL 14.3(H) 17.3(H) 20.6(H)   Hemoglobin 13.0 - 17.0 g/dL 12.3(L) 12.6(L) 13.5   Hematocrit 39 - 52 % 36.3(L) 37.3(L) 41.2   Platelets 150 - 400 K/uL 582(H) 517(H) 508(H)     CMP Latest Ref Rng & Units 06/08/2020 06/07/2020 06/06/2020   Glucose 70 - 99 mg/dL 161(W) 960(A) 540(J)   BUN 6 - 20 mg/dL 14 16 18    Creatinine 0.61 - 1.24 mg/dL 8.11 9.14 7.82(N)   Sodium 135 - 145 mmol/L 130(L) 132(L) 134(L)   Potassium 3.5 - 5.1 mmol/L 3.9 3.9 4.1   Chloride 98 - 111 mmol/L 97(L) 98 98   CO2 22 - 32 mmol/L 24 25 27    Calcium 8.9 - 10.3 mg/dL 8.0(L) 7.7(L) 8.0(L)   Total Protein 6.5 - 8.1 g/dL - - -   Total Bilirubin 0.3 - 1.2 mg/dL - - -   Alkaline Phos 38 - 126 U/L - - -   AST 15 - 41 U/L - - -   ALT 0 - 44 U/L - - -     Impression/reommendation  ?  Severe cellulitis of the left leg with severe edema and blisters  No nec facitis  Common organism are strep and staph  Currently on linezolid and ceftriaxone  Will Dc both and change to cefazolin   Leucocytosis improing  He needs to keep the leg elevated on 4 pillows     ?HTN- on frusemide to reduce the swelling     Severe unilateral edema-   ?  Possible obstructive sleep apnea  May do 2 d echo to assess his Cardiac status /pulmonary HTn    Discussed the management with patient      Electronically signed by Lynn Ito, MD  at 06/09/2020 12:44 AM EDT

## 2020-06-08 NOTE — Progress Notes (Signed)
Formatting of this note is different from the original.  Images from the original note were not included.    PROGRESS NOTE    Thomas Browning  ZOX:[id number] DOB: 09/10/1965 DOA: 05/31/2020  PCP: Patient, No Pcp Per   Brief Narrative:   54 y.o. male with medical history significant of HTN, who presents with left leg pain, swelling, weeping.    Patient states that his left leg pain started approximately 5 days ago, which has been progressively worsening.  Left leg is erythematous, swelling, with significant blistering, open wound and weeping from several popped blisters. The pain is constant, sharp, moderate to severe, nonradiating. 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  Ultrasound on admission negative.    Patient presented with left leg complaints also complains of constipation neck pain.  Started on broad-spectrum IV antibiotics on admission.    Patient's main complaints are constipation and neck pain.  KUB negative for obstruction.  X-ray C-spine negative for fracture.    Assessment & Plan:    Principal Problem:    Left leg cellulitis  Active Problems:    Hypertension    Sepsis (HCC)    Hypokalemia    AKI (acute kidney injury) (HCC)    Cellulitis of left leg    Severe Sepsis due to   left leg cellulitis:   Patient meets criteria for sepsis with leukocytosis with WBC 33.3 and tachycardia with heart rate 100 -->95.  with AKI.  Lactate is normal, currently hemodynamically stable.   Left lower extremity venous Doppler is negative for DVT  Marked leukocytosis  Elevated inflammatory markers.  --MR showed "Findings of diffuse cellulitis with superficial cyst. Non loculated fluid within the fascial layers. No evidence of osteomyelitis or abscess."  --started on vanc/ceftriaxone, with IV clinda added later then d/c'ed again.  Vanc switched to Linezolid.  --ID consulted, collected fluid from blister for culture, NGTD.  --repeat MRI LLE 10/29 ruled out fascitis and showed no drainable fluid  collection.  --Vascular surgery performed local debridement at bedside 10/30, signed off  --TOC attempted to make referral to outpatient wound care center, however, long wait time.  Plan:  --switch abx to cefazolin today, per ID  --elevation on 6 pillows.  --wound care consult today to clean up and solidify dressing change instruction.  --Will follow up with outpatient vascular clinic after discharge    # Severe LLE edema  # possible lymphedema  --LLE edema worsened with development of a layer of edema with multiple blisters, likely due to IVF given since presentation.  --MIVF d/c'ed on 10/27.  Had been receiving IV lasix 40 mg BID.  Held on 10/31 due to Cr increase, resumed on 11/1.  PLAN:  --cont IV lasix 40 mg BID while Cr stable  --outpatient followup with vascular for ABI and venous duplex (rule out reflux)  --Outpatient followup with vascular for [place] wrap weekly    Constipation  Patient states he is chronically constipated and takes multiple laxatives with minimal relief  Likely owing to dietary indiscretions considering his occupation as a [employer]  No obstruction or ileus noted on imaging  Moderate stool burden  --s/p high-dose miralax  Plan:  --mag citrate   --Miralax 34 g BID    Hypertension  patient not taking medications at home.    BP acceptable.  --IV hydralazine PRN    Hypokalemia:  --replete PRN    AKI (acute kidney injury) (HCC)  Creatinine 1.49, BUN 25, no baseline creatinine  available,   may be due to dehydration versus sepsis.  --Cr improved to 1.03 after IVF and tx of cellulitis   --monitor Cr while aggressively diuresing    Cervicalgia  Patient is a truck driver  Exam significant for rigid lump in posterior neck  Noted to soft tissue on x-ray  C-spine x-ray negative for fracture  Plan:  --cont lidocaine patch    Morbid obesity  BMI 41.8  This complicates care and prognosis    Hyperglycemia  Pre-diabetes  --No hx of DM2.  A1c 6.3.  --BG within inpatient goal.  No need for fingersticks  or SSI.    Thrombocythemia   --plt normal on presentation, now trending up.  Likely reactive.  --trend for now    DVT prophylaxis: Lovenox  Code Status: Full  Family Communication:    Status is: Inpatient    Dispo: The patient is from: Home               Anticipated d/c is to: Home               Anticipated d/c date is: 2-3 days               Patient currently is not medically stable to d/c.  Severe sepsis 2/2 cellulitis, currently on IV abx, per ID rec.    Consultants:    None    Procedures:    None    Antimicrobials:    Vancomycin   Ceftriaxone   Clindamycin    Subjective:  Leg pain controlled.  Having BM's but still felt constipated.  Voiding well in response to diuretics.      Wound care consulted today.    Objective:  Vitals:    06/08/20 0003 06/08/20 0725 06/08/20 1158 06/08/20 1611   BP: 110/65 127/79 128/77 133/79   Pulse: 87 82 83 87   Resp: 18 16 16 16    Temp: 99.1 F (37.3 C) 98.7 F (37.1 C) 98.4 F (36.9 C) 98.1 F (36.7 C)   TempSrc: Oral Oral Oral Oral   SpO2: 96% 97% 97% 98%   Weight:       Height:         Intake/Output Summary (Last 24 hours) at 06/08/2020 1733  Last data filed at 06/08/2020 1548  Gross per 24 hour   Intake 720 ml   Output 4575 ml   Net -3855 ml     Filed Weights    05/31/20 1004   Weight: 136.1 kg     Examination:    Constitutional: NAD, AAOx3  HEENT: conjunctivae and lids normal, EOMI  CV: No cyanosis.    RESP: normal respiratory effort, on RA  Extremities: No effusions, edema in RLE, LLE similar to most recent photo below.    SKIN: warm.  LLE skin starting to scab.  Neuro: II - XII grossly intact.    Psych: Normal mood and affect.  Appropriate judgement and reason    05/31/20    06/02/20    06/05/2020, post local debridement    06/08/20    Data Reviewed: I have personally reviewed following labs and imaging studies    CBC:  Recent Labs   Lab 06/04/20  0502 06/05/20  0431 06/06/20  0332 06/07/20  0424 06/08/20  0425   WBC 27.6* 24.4* 20.6* 17.3* 14.3*   HGB 13.8 13.3 13.5  12.6* 12.3*   HCT 40.7 39.8 41.2 37.3* 36.3*   MCV 87.5 86.9 87.7 87.1 87.9   PLT 424*  455* 508* 517* 582*     Basic Metabolic Panel:  Recent Labs   Lab 06/04/20  0502 06/05/20  0431 06/06/20  0332 06/07/20  0424 06/08/20  0425   NA 132* 131* 134* 132* 130*   K 3.9 3.8 4.1 3.9 3.9   CL 95* 98 98 98 97*   CO2 25 25 27 25 24    GLUCOSE 113* 127* 141* 113* 112*   BUN 15 18 18 16 14    CREATININE 1.30* 1.32* 1.40* 1.18 1.17   CALCIUM 7.9* 7.8* 8.0* 7.7* 8.0*   MG 2.3 2.2 2.2 2.2 2.4     GFR:  Estimated Creatinine Clearance: 101.7 mL/min (by C-G formula based on SCr of 1.17 mg/dL).  Liver Function Tests:  No results for input(s): AST, ALT, ALKPHOS, BILITOT, PROT, ALBUMIN in the last 168 hours.  No results for input(s): LIPASE, AMYLASE in the last 168 hours.  No results for input(s): AMMONIA in the last 168 hours.  Coagulation Profile:  No results for input(s): INR, PROTIME in the last 168 hours.  Cardiac Enzymes:  No results for input(s): CKTOTAL, CKMB, CKMBINDEX, TROPONINI in the last 168 hours.  BNP (last 3 results)  No results for input(s): PROBNP in the last 8760 hours.  HbA1C:  No results for input(s): HGBA1C in the last 72 hours.  CBG:  No results for input(s): GLUCAP in the last 168 hours.  Lipid Profile:  No results for input(s): CHOL, HDL, LDLCALC, TRIG, CHOLHDL, LDLDIRECT in the last 72 hours.  Thyroid Function Tests:  No results for input(s): TSH, T4TOTAL, FREET4, T3FREE, THYROIDAB in the last 72 hours.  Anemia Panel:  No results for input(s): VITAMINB12, FOLATE, FERRITIN, TIBC, IRON, RETICCTPCT in the last 72 hours.  Sepsis Labs:  No results for input(s): PROCALCITON, LATICACIDVEN in the last 168 hours.    Recent Results (from the past 240 hour(s))   Blood culture (routine x 2)     Status: None    Collection Time: 05/31/20 10:14 AM    Specimen: BLOOD   Result Value Ref Range Status    Specimen Description BLOOD LEFT ASSIST CONTROL  Final    Special Requests   Final     BOTTLES DRAWN AEROBIC AND ANAEROBIC Blood  Culture results may not be optimal due to an excessive volume of blood received in culture bottles    Culture   Final     NO GROWTH 5 DAYS  Performed at [facility], [address] [place], [place] [address]     Report Status 06/05/2020 FINAL  Final   Blood culture (routine x 2)     Status: None    Collection Time: 05/31/20  1:48 PM    Specimen: BLOOD   Result Value Ref Range Status    Specimen Description BLOOD BLOOD LEFT FOREARM  Final    Special Requests   Final     BOTTLES DRAWN AEROBIC AND ANAEROBIC Blood Culture adequate volume    Culture   Final     NO GROWTH 5 DAYS  Performed at [facility], [address] [place], [place] [address]     Report Status 06/05/2020 FINAL  Final   Respiratory Panel by RT PCR (Flu A&B, Covid) - Nasopharyngeal Swab     Status: None    Collection Time: 05/31/20  3:02 PM    Specimen: Nasopharyngeal Swab   Result Value Ref Range Status    SARS Coronavirus 2 by RT PCR NEGATIVE NEGATIVE Final  Comment: (NOTE)  SARS-CoV-2 target nucleic acids are NOT DETECTED.    The SARS-CoV-2 RNA is generally detectable in upper respiratoy  specimens during the acute phase of infection. The lowest  concentration of SARS-CoV-2 viral copies this assay can detect is  131 copies/mL. A negative result does not preclude SARS-Cov-2  infection and should not be used as the sole basis for treatment or  other patient management decisions. A negative result may occur with   improper specimen collection/handling, submission of specimen other  than nasopharyngeal swab, presence of viral mutation(s) within the  areas targeted by this assay, and inadequate number of viral copies  (<131 copies/mL). A negative result must be combined with clinical  observations, patient history, and epidemiological information. The  expected result is Negative.    Fact Sheet for Patients:   https://www.moore.com/    Fact Sheet for Healthcare Providers:    https://www.young.biz/    This test is no t yet approved or cleared by the [place] FDA and   has been authorized for detection and/or diagnosis of SARS-CoV-2 by  FDA under an Emergency Use Authorization (EUA). This EUA will remain   in effect (meaning this test can be used) for the duration of the  COVID-19 declaration under Section 564(b)(1) of the Act, 21 U.S.C.  section 360bbb-3(b)(1), unless the authorization is terminated or  revoked sooner.       Influenza A by PCR NEGATIVE NEGATIVE Final    Influenza B by PCR NEGATIVE NEGATIVE Final     Comment: (NOTE)  The Xpert Xpress SARS-CoV-2/FLU/RSV assay is intended as an aid in   the diagnosis of influenza from Nasopharyngeal swab specimens and   should not be used as a sole basis for treatment. Nasal washings and   aspirates are unacceptable for Xpert Xpress SARS-CoV-2/FLU/RSV   testing.    Fact Sheet for Patients:  https://www.moore.com/    Fact Sheet for Healthcare Providers:  https://www.young.biz/    This test is not yet approved or cleared by the [place] FDA and   has been authorized for detection and/or diagnosis of SARS-CoV-2 by   FDA under an Emergency Use Authorization (EUA). This EUA will remain   in effect (meaning this test can be used) for the duration of the   Covid-19 declaration under Section 564(b)(1) of the Act, 21   U.S.C. section 360bbb-3(b)(1), unless the authorization is   terminated or revoked.  Performed at [facility] [address] [place],  [place] [address]    Aerobic Culture (superficial specimen)     Status: None    Collection Time: 06/04/20 10:53 PM    Specimen: Leg; Wound   Result Value Ref Range Status    Specimen Description   Final     LEG LEFT  Performed at [facility], [address], [place], [place] [address]     Special Requests   Final     NONE  Performed at [facility], [address] [place], [place] [address]     Gram  Stain   Final     RARE WBC PRESENT, PREDOMINANTLY PMN  NO ORGANISMS SEEN     Culture   Final     NO GROWTH 2 DAYS  Performed at [facility], [address], [place], [place] [address]     Report Status 06/07/2020 FINAL  Final       Radiology Studies:  No results found.    Scheduled Meds:  ? enoxaparin (  LOVENOX) injection  0.5 mg/kg Subcutaneous Q24H   ? furosemide  40 mg Intravenous BID   ? hydrOXYzine  100 mg Oral QHS   ? lidocaine  1 patch Transdermal Q24H   ? linezolid  600 mg Oral Q12H   ? senna-docusate  1 tablet Oral BID   ? traZODone  50 mg Oral QHS     Continuous Infusions:     LOS: 8 days     Darlin Priestly, MD  [facility]  Pager [phone]    If 7PM-7AM, please contact night-coverage  06/08/2020, 5:33 PM   Electronically signed by Darlin Priestly, MD at 06/09/2020  5:27 AM EDT

## 2020-06-08 NOTE — Unmapped (Signed)
Formatting of this note might be different from the original.  WOC Nurse Consult Note:  Reason for Consult:Paitient is followed by vascular surgery and received bedside debridement   MD provided orders for topical wound care and bedside RN to perform.    Wound type: venous insufficiency  Pressure Injury POA: NA  Measurement: Skin is degloved from knee down to left leg.   Wound FAO:ZHYQM red  Drainage (amount, consistency, odor) moderate serosanguinous  No odor.   Periwound:edema and tenderness  Dressing procedure/placement/frequency:Bedside RN to perform.  Cleanse left leg with NS and pat dry. Apply Aquacel Ag to open wounds (MAy require 4-5 sheets) LAWSON # [id number]) .  Wrap leg with kerlix from below toes to below knee. Secure with ace wrap. Change daily.   Will not follow at this time.  Please re-consult if needed.   Maple Hudson MSN, RN, FNP-BC CWON  Wound, Ostomy, Continence Nurse  Pager [phone]      Electronically signed by Alphonzo Dublin, FNP at 06/08/2020 12:52 PM EDT

## 2020-06-09 LAB — BASIC METABOLIC PANEL
Anion gap: 10 (ref 5–15)
BUN: 16 mg/dL (ref 6–20)
CO2: 25 mmol/L (ref 22–32)
Calcium: 8.6 mg/dL — ABNORMAL LOW (ref 8.9–10.3)
Chloride: 97 mmol/L — ABNORMAL LOW (ref 98–111)
Creatinine, Ser: 1.21 mg/dL (ref 0.61–1.24)
GFR, Estimated: >60 mL/min (ref >60)
Glucose, Bld: 116 mg/dL — ABNORMAL HIGH (ref 70–99)
Potassium: 3.9 mmol/L (ref 3.5–5.1)
Sodium: 132 mmol/L — ABNORMAL LOW (ref 135–145)

## 2020-06-09 LAB — CBC
HCT: 36.8 % — ABNORMAL LOW (ref 39.0–52.0)
Hemoglobin: 12.3 g/dL — ABNORMAL LOW (ref 13.0–17.0)
MCH: 29.2 pg (ref 26.0–34.0)
MCHC: 33.4 g/dL (ref 30.0–36.0)
MCV: 87.4 fL (ref 80.0–100.0)
Platelets: 633 10*3/uL — ABNORMAL HIGH (ref 150–400)
RBC: 4.21 MIL/uL — ABNORMAL LOW (ref 4.22–5.81)
RDW: 14.1 % (ref 11.5–15.5)
WBC: 12.6 10*3/uL — ABNORMAL HIGH (ref 4.0–10.5)
nRBC: 0 % (ref 0.0–0.2)

## 2020-06-09 LAB — MAGNESIUM: Magnesium: 2.3 mg/dL (ref 1.7–2.4)

## 2020-06-09 MED ORDER — MAGNESIUM CITRATE PO SOLN
1.0000 | Freq: Once | ORAL | Status: AC
  Administered 2020-06-09: 1 via ORAL
  Filled 2020-06-09: qty 296

## 2020-06-09 MED ORDER — POLYETHYLENE GLYCOL 3350 17 G PO PACK
34.0000 g | PACK | Freq: Two times a day (BID) | ORAL | Status: DC
  Administered 2020-06-09 – 2020-06-14 (×12): 34 g via ORAL
  Filled 2020-06-09 (×13): qty 2

## 2020-06-09 MED ORDER — CEFAZOLIN SODIUM-DEXTROSE 2-4 GM/100ML-% IV SOLN
2.0000 g | Freq: Three times a day (TID) | INTRAVENOUS | Status: DC
  Administered 2020-06-09 – 2020-06-15 (×19): 2 g via INTRAVENOUS
  Filled 2020-06-09 (×26): qty 100

## 2020-06-09 NOTE — Progress Notes (Signed)
PROGRESS NOTE    Stephen [name]  IZT:[id number] DOB: February 18, 1966 DOA: 05/31/2020 PCP: Patient, No Pcp Per   Chief Complaint  Patient presents with  . Leg Pain    Brief Narrative:  54 y.o.malewith medical history significant ofHTN,who presents with left leg pain, swelling, weeping.  Patient states that his left leg pain started approximately 5 days ago, which has been progressively worsening. 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  Ultrasound on admission negative.  Patient presented with left leg complaints also complains of constipation neck pain.  Started on broad-spectrum IV antibiotics on admission.  Patient's main complaints are constipation and neck pain.  KUB negative for obstruction.  X-ray C-spine negative for fracture.  Assessment & Plan:   Principal Problem:   Left leg cellulitis Active Problems:   Hypertension   Sepsis ([place])   Hypokalemia   AKI (acute kidney injury) ([place])   Cellulitis of left leg  Severe Sepsis due to left leg cellulitis: Sepsis ruled in  Left lower extremity venous Doppler isnegative for DVT MRI 10/29 with diffuse and marked subcutaneous soft tissue swelling/edema/fluid - numerous skin lesions, likely blisters.  Possible developing abscesses.  No myofasciitis or pyomyositis.  No septic arthritis or osteomyelitis. ESR, CRP elevated, follow  started on vanc (10/25 - 10/27), ceftriaxone (10/25 - 11/2).  Clindamycin 10/26-26.  Linezolid 10/28-11/2.  Ancef 11/3 - present.  --ID consulted, collected fluid from blister for culture, NGTD. --Vascular surgery performed local debridement at bedside 10/30, signed off --TOC attempted to make referral to outpatient wound care center, however, long wait time. Plan: --switch abx to cefazolin today,  per ID --elevation on 6 pillows. --wound care consult -> recommending ace wraps for minimal compression and leg elevation.  Needs vascular w/u outpatient and clear diagnosis (venous insufficiency vs lymphedema).   --Will follow up with outpatient vascular clinic after discharge  # Severe LLE edema # possible lymphedema --LLE edema worsened with development of Stephen [name] layer of edema with multiple blisters, likely due to IVF given since presentation. --MIVF d/c'ed on 10/27.  Had been receiving IV lasix 40 mg BID.  Held on 10/31 due to Cr increase, resumed on 11/1. 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   BP acceptable.  Hypokalemia: --replete PRN  AKI (acute kidney injury) ([place]) Creatinine 1.49, BUN 25, no baseline creatinine available, may be due to dehydration versus sepsis. 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  A1c 6.3. --BG within inpatient goal.  No need for fingersticks or SSI.  Thrombocythemia  --plt normal on presentation, now  trending up.  Likely reactive. --trend for  now  DVT prophylaxis: lovenox Code Status: full  Family Communication: mother at bedside Disposition:   Status is: Inpatient  Remains inpatient appropriate because:Inpatient level of care appropriate due to severity of illness   Dispo: The patient is from: Home              Anticipated d/c is to: SNF              Anticipated d/c date is: > 3 days              Patient currently is not medically stable to d/c. Consultants:   ID  vascular  Procedures:  Local debridement 10/30  LE US  IMPRESSION: No evidence of deep venous thrombosis in the left lower extremity. Right common femoral vein also present. Diffuse left lower extremity soft tissue edema noted. Antimicrobials: Anti-infectives (From admission, onward)   Start     Dose/Rate Route Frequency Ordered Stop   06/09/20 0800  ceFAZolin (ANCEF) IVPB 2g/100 mL premix        2 g 200 mL/hr over 30 Minutes Intravenous Every 8 hours 06/09/20 0036     06/07/20 2000  linezolid (ZYVOX) tablet 600 mg  Status:  Discontinued        600 mg Oral Every 12 hours 06/07/20 1431 06/09/20 0036   06/03/20 1800  linezolid (ZYVOX) IVPB 600 mg  Status:  Discontinued        600 mg 300 mL/hr over 60 Minutes Intravenous Every 12 hours 06/03/20 1655 06/07/20 1431   06/01/20 1500  clindamycin (CLEOCIN) IVPB 900 mg  Status:  Discontinued        900 mg 100 mL/hr over 30 Minutes Intravenous Every 8 hours 06/01/20 1334 06/02/20 1302   06/01/20 0400  vancomycin (VANCOCIN) IVPB 1000 mg/200 mL premix  Status:  Discontinued        1,000 mg 200 mL/hr over 60 Minutes Intravenous Every 12 hours 05/31/20 1658 06/03/20 1655   05/31/20 1500  vancomycin (VANCOREADY) IVPB 1500 mg/300 mL        1,500 mg 150 mL/hr over 120 Minutes Intravenous  Once 05/31/20 1358 05/31/20 1815   05/31/20 1415  cefTRIAXone (ROCEPHIN) 2 g in sodium chloride 0.9 % 100 mL IVPB  Status:  Discontinued        2 g 200 mL/hr over 30 Minutes Intravenous Every 24 hours 05/31/20 1404  06/08/20 1433   05/31/20 1215  vancomycin (VANCOCIN) IVPB 1000 mg/200 mL premix        1,000 mg 200 mL/hr over 60 Minutes Intravenous  Once 05/31/20 1204 05/31/20 1500     Subjective: Feeling gradually better  Objective: Vitals:   06/09/20 0435 06/09/20 0804 06/09/20 1148 06/09/20 1522  BP: (!) 134/97 131/88 135/78 126/72  Pulse: 89 86 81 87  Resp: _0 Temp: 98.2 F (36.8 C) 98.5 F (36.9 C) 98.3 F (36.8 C) 97.6 F (36.4 C)  TempSrc: Oral Oral Oral Oral  SpO2: 96% 99% 98% 98%  Weight:      Height:        Intake/Output Summary (Last 24 hours) at 06/09/2020 1704 Last data filed at 06/09/2020 1521 Gross per 24 hour  Intake 460 ml  Output 2350 ml  Net -1890 ml   Filed Weights   05/31/20 1004  Weight: 136.1 kg    Examination:  General exam: Appears calm and comfortable  Respiratory system: Clear to auscultation. Respiratory effort normal. Cardiovascular system:  S1 & S2 heard, RRR. Gastrointestinal system: Abdomen is nondistended, soft and nontender.  Central nervous system: Alert and oriented. No focal neurological deficits. Extremities: LLE with several open wounds, redness appears to be improving when compared to previous images  Psychiatry: Judgement and insight appear normal. Mood & affect appropriate.     Data Reviewed: I have personally reviewed following labs and imaging studies  CBC: Recent Labs  Lab 06/05/20 0431 06/06/20 0332 06/07/20 0424 06/08/20 0425 06/09/20 0644  WBC 24.4* 20.6* 17.3* 14.3* 12.6*  HGB 13.3 13.5 12.6* 12.3* 12.3*  HCT 39.8 41.2 37.3* 36.3* 36.8*  MCV 86.9 87.7 87.1 87.9 87.4  PLT 455* 508* 517* 582* 633*    Basic Metabolic Panel: Recent Labs  Lab 06/05/20 0431 06/06/20 0332 06/07/20 0424 06/08/20 0425 06/09/20 0644  NA 131* 134* 132* 130* 132*  K 3.8 4.1 3.9 3.9 3.9  CL 98 98 98 97* 97*  CO2 _0 GLUCOSE 127* 141* 113* 112* 116*  BUN _1 CREATININE 1.32* 1.40* 1.18 1.17 1.21   CALCIUM 7.8* 8.0* 7.7* 8.0* 8.6*  MG 2.2 2.2 2.2 2.4 2.3    GFR: Estimated Creatinine Clearance: 98.3 mL/min (by C-G formula based on SCr of 1.21 mg/dL).  Liver Function Tests: No results for input(s): AST, ALT, ALKPHOS, BILITOT, PROT, ALBUMIN in the last 168 hours.  CBG: No results for input(s): GLUCAP in the last 168 hours.   Recent Results (from the past 240 hour(s))  Blood culture (routine x 2)     Status: None   Collection Time: 05/31/20 10:14 AM   Specimen: BLOOD  Result Value Ref Range Status   Specimen Description BLOOD LEFT ASSIST CONTROL  Final   Special Requests   Final    BOTTLES DRAWN AEROBIC AND ANAEROBIC Blood Culture results may not be optimal due to an excessive volume of blood received in culture bottles   Culture   Final    NO GROWTH 5 DAYS Performed at [facility], [address], [place], [place] [address]    Report Status 06/05/2020 FINAL  Final  Blood culture (routine x 2)     Status: None   Collection Time: 05/31/20  1:48 PM   Specimen: BLOOD  Result Value Ref Range Status   Specimen Description BLOOD BLOOD LEFT FOREARM  Final   Special Requests   Final    BOTTLES DRAWN AEROBIC AND ANAEROBIC Blood Culture adequate volume   Culture   Final    NO GROWTH 5 DAYS Performed at [facility], [address] [place], [place] [address]    Report Status 06/05/2020 FINAL  Final  Respiratory Panel by RT PCR (Flu Sheldon Sem&B, Covid) - Nasopharyngeal Swab     Status: None   Collection Time: 05/31/20  3:02 PM   Specimen: Nasopharyngeal Swab  Result Value Ref Range Status   SARS Coronavirus 2 by RT PCR NEGATIVE NEGATIVE Final    Comment: (NOTE) SARS-CoV-2 target nucleic acids are NOT DETECTED.  The SARS-CoV-2 RNA is generally detectable in upper respiratoy specimens during the acute phase of infection. The lowest concentration of SARS-CoV-2 viral copies this assay can detect is 131 copies/mL. Kc Sedlak negative result does not preclude  SARS-Cov-2 infection and should not be used as the sole basis for treatment or other patient management decisions. Lamica Mccart negative result may occur with  improper specimen collection/handling, submission of specimen other than nasopharyngeal swab, presence of viral mutation(s) within the areas targeted by  this assay, and inadequate number of viral copies (<131 copies/mL). Nakeisha Greenhouse negative result must be combined with clinical observations, patient history, and epidemiological information. The expected result is Negative.  Fact Sheet for Patients:  PinkCheek.be  Fact Sheet for Healthcare Providers:  GravelBags.it  This test is no t yet approved or cleared by the [place] FDA and  has been authorized for detection and/or diagnosis of SARS-CoV-2 by FDA under an Emergency Use Authorization (EUA). This EUA will remain  in effect (meaning this test can be used) for the duration of the COVID-19 declaration under Section 564(b)(1) of the Act, 21 U.S.C. section 360bbb-3(b)(1), unless the authorization is terminated or revoked sooner.     Influenza Darl Kuss by PCR NEGATIVE NEGATIVE Final   Influenza B by PCR NEGATIVE NEGATIVE Final    Comment: (NOTE) The Xpert Xpress SARS-CoV-2/FLU/RSV assay is intended as an aid in  the diagnosis of influenza from Nasopharyngeal swab specimens and  should not be used as Nicklous Aburto sole basis for treatment. Nasal washings and  aspirates are unacceptable for Xpert Xpress SARS-CoV-2/FLU/RSV  testing.  Fact Sheet for Patients: PinkCheek.be  Fact Sheet for Healthcare Providers: GravelBags.it  This test is not yet approved or cleared by the [place] FDA and  has been authorized for detection and/or diagnosis of SARS-CoV-2 by  FDA under an Emergency Use Authorization (EUA). This EUA will remain  in effect (meaning this test can be used) for the duration of the   Covid-19 declaration under Section 564(b)(1) of the Act, 21  U.S.C. section 360bbb-3(b)(1), unless the authorization is  terminated or revoked. Performed at [facility], [address] [place], [place] [address]   Aerobic Culture (superficial specimen)     Status: None   Collection Time: 06/04/20 10:53 PM   Specimen: Leg; Wound  Result Value Ref Range Status   Specimen Description   Final    LEG LEFT Performed at [facility], [address] [place], [place] [address]    Special Requests   Final    NONE Performed at [facility], [address] [place], [place] [address]    Gram Stain   Final    RARE WBC PRESENT, PREDOMINANTLY PMN NO ORGANISMS SEEN    Culture   Final    NO GROWTH 2 DAYS Performed at [facility], [place] [address], [place], [place] [address]    Report Status 06/07/2020 FINAL  Final         Radiology Studies: No results found.      Scheduled Meds: . enoxaparin (LOVENOX) injection  0.5 mg/kg Subcutaneous Q24H  . furosemide  40 mg Intravenous BID  . hydrOXYzine  100 mg Oral QHS  . lidocaine  1 patch Transdermal Q24H  . polyethylene glycol  34 g Oral BID  . senna-docusate  1 tablet Oral BID  . traZODone  50 mg Oral QHS   Continuous Infusions: .  ceFAZolin (ANCEF) IV 2 g (06/09/20 1521)     LOS: 9 days    Time spent: over 30 min    Fayrene Helper, MD [facility]   To contact the attending provider between 7A-7P or the covering provider during after hours 7P-7A, please log into the web site [email or web address] and access using universal [place] password for that web site. If you do not have the password, please call the hospital operator.  06/09/2020, 5:04 PM

## 2020-06-09 NOTE — Consult Note (Signed)
WOC Nurse wound follow up Orders clarified with bedside RN this AM.  Due to severe sepsis and cellulitis, will proceed slowly with compression.  At this time, will initiate ace wraps for minimal compression and leg elevation.  Patient will need vascular workup, outpatient, and clear diagnosis (Venous insufficiency vs lymphedema) to increase compression safely.   Will not follow at this time.  Please re-consult if needed.  Maple Hudson MSN, RN, FNP-BC CWON Wound, Ostomy, Continence Nurse Pager [phone]

## 2020-06-09 NOTE — Progress Notes (Signed)
Physical Therapy Treatment Patient Details Name: Stephen [name] MRN: [id number] DOB: June 13, 1966 Today's Date: 06/09/2020    History of Present Illness Pt is a 54 y.o. male presenting to hospital 10/25 with L leg redness, swelling, pain, and weapage.  Pt admitted with sepsis d/t L leg cellulitis, htn, hypokalemia, AKI, and cervicalgia (rigid lump posterior neck).  PMH includes htn.    PT Comments    Pt is making good progress with goals with ability to ambulate in hallway using his own BRW. Cues for safe ambulation. Prefers to sit in bed at end of session as able to elevate leg higher. Will continue to progress as able.    Follow Up Recommendations  Home health PT     Equipment Recommendations   (has BRW)    Recommendations for Other Services       Precautions / Restrictions Precautions Precautions: Fall Precaution Comments: LLE wound Restrictions Weight Bearing Restrictions: No    Mobility  Bed Mobility Overal bed mobility: Independent             General bed mobility comments: able to easily transition to EOB. Once seated, pain increased with dependent position  Transfers Overall transfer level: Needs assistance Equipment used: Rolling walker (2 wheeled) Transfers: Sit to/from Stand Sit to Stand: Modified independent (Device/Increase time)         General transfer comment: upright posture noted with wide stance. RW used  Ambulation/Gait Ambulation/Gait assistance: Supervision Gait Distance (Feet): 90 Feet Assistive device: Rolling walker (2 wheeled) Gait Pattern/deviations: Step-to pattern     General Gait Details: antalgic step to gait pattern performed with increased WBing noted through B UE. Further distance deferred secondary to pain   Stairs             Wheelchair Mobility    Modified Rankin (Stroke Patients Only)       Balance Overall balance assessment: Needs assistance Sitting-balance support: No upper extremity supported;Feet  supported Sitting balance-Leahy Scale: Normal     Standing balance support: Bilateral upper extremity supported;During functional activity Standing balance-Leahy Scale: Good                              Cognition Arousal/Alertness: Awake/alert Behavior During Therapy: WFL for tasks assessed/performed Overall Cognitive Status: Within Functional Limits for tasks assessed                                        Exercises Other Exercises Other Exercises: supine ther-ex performed on B LE including SLRs, hip abd/add, and quad sets. All ther-ex performed x 10 reps with supervision.     General Comments        Pertinent Vitals/Pain Pain Assessment: 0-10 Pain Score: 9  Pain Location: LLE Pain Descriptors / Indicators: Aching Pain Intervention(s): Limited activity within patient's tolerance;Premedicated before session;Repositioned    Home Living                      Prior Function            PT Goals (current goals can now be found in the care plan section) Acute Rehab PT Goals Patient Stated Goal: to improve L LE pain and mobility PT Goal Formulation: With patient Time For Goal Achievement: 06/16/20 Potential to Achieve Goals: Good Progress towards PT goals: Progressing toward goals    Frequency  Min 2X/week      PT Plan Current plan remains appropriate    Co-evaluation              AM-PAC PT "6 Clicks" Mobility   Outcome Measure  Help needed turning from your back to your side while in a flat bed without using bedrails?: None Help needed moving from lying on your back to sitting on the side of a flat bed without using bedrails?: None Help needed moving to and from a bed to a chair (including a wheelchair)?: A Little Help needed standing up from a chair using your arms (e.g., wheelchair or bedside chair)?: A Little Help needed to walk in hospital room?: A Little Help needed climbing 3-5 steps with a railing? : A  Little 6 Click Score: 20    End of Session Equipment Utilized During Treatment: Gait belt Activity Tolerance: Patient limited by pain Patient left: in bed;with call bell/phone within reach Nurse Communication: Mobility status PT Visit Diagnosis: Other abnormalities of gait and mobility (R26.89);Difficulty in walking, not elsewhere classified (R26.2);Pain Pain - Right/Left: Left Pain - part of body: Leg     Time: 0857-0920 PT Time Calculation (min) (ACUTE ONLY): 23 min  Charges:  $Gait Training: 8-22 mins $Therapeutic Exercise: 8-22 mins                     Elizabeth Palau, PT, DPT [phone]    Stephen [name] 06/09/2020, 1:45 PM

## 2020-06-09 NOTE — Progress Notes (Signed)
Formatting of this note is different from the original.    PROGRESS NOTE    Thomas Browning  ZOX:[id number] DOB: October 13, 1965 DOA: 05/31/2020  PCP: Patient, No Pcp Per     Chief Complaint   Patient presents with   ? Leg Pain     Brief Narrative:   54 y.o.malewith medical history significant ofHTN,who presents with left leg pain, swelling, weeping.    Patient states that his left leg pain started approximately 5 days ago, which has been progressively worsening. 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  Ultrasound on admission negative.    Patient presented with left leg complaints also complains of constipation neck pain.  Started on broad-spectrum IV antibiotics on admission.    Patient's main complaints are constipation and neck pain.  KUB negative for obstruction.  X-ray C-spine negative for fracture.    Assessment & Plan:    Principal Problem:    Left leg cellulitis  Active Problems:    Hypertension    Sepsis (HCC)    Hypokalemia    AKI (acute kidney injury) (HCC)    Cellulitis of left leg    Severe Sepsis due to left leg cellulitis:  Sepsis ruled in   Left lower extremity venous Doppler isnegative for DVT  MRI 10/29 with diffuse and marked subcutaneous soft tissue swelling/edema/fluid - numerous skin lesions, likely blisters.  Possible developing abscesses.  No myofasciitis or pyomyositis.  No septic arthritis or osteomyelitis.  ESR, CRP elevated, follow   started on vanc (10/25 - 10/27), ceftriaxone (10/25 - 11/2).  Clindamycin 10/26-26.  Linezolid 10/28-11/2.  Ancef 11/3 - present.   --ID consulted, collected fluid from blister for culture, NGTD.  --Vascular surgery performed local debridement at bedside 10/30, signed off  --TOC attempted to make referral to outpatient wound care center, however, long  wait time.  Plan:  --switch abx to cefazolin today, per ID  --elevation on 6 pillows.  --wound care consult -> recommending ace wraps for minimal compression and leg elevation.  Needs vascular w/u outpatient and clear diagnosis (venous insufficiency vs lymphedema).    --Will follow up with outpatient vascular clinic after discharge    # Severe LLE edema  # possible lymphedema  --LLE edema worsened with development of a layer of edema with multiple blisters, likely due to IVF given since presentation.  --MIVF d/c'ed on 10/27.  Had been receiving IV lasix 40 mg BID.  Held on 10/31 due to Cr increase, resumed on 11/1.  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    BP acceptable.    Hypokalemia:  --replete PRN    AKI (acute kidney injury) (HCC)  Creatinine 1.49, BUN 25, no baseline creatinine available,  may be due to dehydration versus sepsis.  --Cr improved to  1.03 after IVF and tx of cellulitis   --monitor Cr while aggressively diuresing    Cervicalgia  Patient is a truck driver  Exam significant for rigid lump in posterior neck  Noted to soft tissue on x-ray  C-spine x-ray negative for fracture  Plan:  --cont lidocaine patch    Morbid obesity  BMI 41.8  This complicates care and prognosis    Hyperglycemia  Pre-diabetes  --No hx of DM2.  A1c 6.3.  --BG within inpatient goal.  No need for fingersticks or SSI.    Thrombocythemia   --plt normal on presentation,  now trending up.  Likely reactive.  --trend for now    DVT prophylaxis: lovenox  Code Status: full   Family Communication: mother at bedside  Disposition:     Status is: Inpatient    Remains inpatient appropriate because:Inpatient level of care appropriate due to severity of illness    Dispo: The patient is from: Home               Anticipated d/c is to: SNF               Anticipated d/c date is: > 3 days               Patient currently is not medically stable to d/c.  Consultants:    ID   vascular    Procedures:   Local debridement 10/30    LE [place]    IMPRESSION:  No evidence of deep venous thrombosis in the left lower extremity.  Right common femoral vein also present. Diffuse left lower extremity  soft tissue edema noted.  Antimicrobials:  Anti-infectives (From admission, onward)    Start     Dose/Rate Route Frequency Ordered Stop    06/09/20 0800  ceFAZolin (ANCEF) IVPB 2g/100 mL premix         2 g  200 mL/hr over 30 Minutes Intravenous Every 8 hours 06/09/20 0036      06/07/20 2000  linezolid (ZYVOX) tablet 600 mg  Status:  Discontinued         600 mg Oral Every 12 hours 06/07/20 1431 06/09/20 0036    06/03/20 1800  linezolid (ZYVOX) IVPB 600 mg  Status:  Discontinued         600 mg  300 mL/hr over 60 Minutes Intravenous Every 12 hours 06/03/20 1655 06/07/20 1431    06/01/20 1500  clindamycin (CLEOCIN) IVPB 900 mg  Status:  Discontinued         900 mg  100 mL/hr over 30 Minutes Intravenous Every 8 hours 06/01/20 1334 06/02/20 1302    06/01/20 0400  vancomycin (VANCOCIN) IVPB 1000 mg/200 mL premix  Status:  Discontinued         1,000 mg  200 mL/hr over 60 Minutes Intravenous Every 12 hours 05/31/20 1658 06/03/20 1655    05/31/20 1500  vancomycin (VANCOREADY) IVPB 1500 mg/300 mL         1,500 mg  150 mL/hr over 120 Minutes Intravenous  Once 05/31/20 1358 05/31/20 1815    05/31/20 1415  cefTRIAXone (ROCEPHIN) 2 g in sodium chloride 0.9 % 100 mL IVPB  Status:  Discontinued         2 g  200 mL/hr over 30 Minutes  Intravenous Every 24 hours 05/31/20 1404 06/08/20 1433    05/31/20 1215  vancomycin (VANCOCIN) IVPB 1000 mg/200 mL premix         1,000 mg  200 mL/hr over 60 Minutes  Intravenous  Once 05/31/20 1204 05/31/20 1500       Subjective:  Feeling gradually better    Objective:  Vitals:    06/09/20 0435 06/09/20 0804 06/09/20 1148 06/09/20 1522   BP: (!) 134/97 131/88 135/78 126/72   Pulse: 89 86 81 87   Resp: 18 15 15 15    Temp: 98.2 F (36.8 C) 98.5 F (36.9 C) 98.3 F (36.8 C) 97.6 F (36.4 C)   TempSrc: Oral Oral Oral Oral   SpO2: 96% 99% 98% 98%   Weight:       Height:         Intake/Output Summary (Last 24 hours) at 06/09/2020 1704  Last data filed at 06/09/2020 1521  Gross per 24 hour   Intake 460 ml   Output 2350 ml   Net -1890 ml     Filed Weights    05/31/20 1004   Weight: 136.1 kg     Examination:    General exam: Appears calm and comfortable   Respiratory system: Clear to auscultation. Respiratory effort normal.  Cardiovascular system: S1 & S2 heard, RRR.  Gastrointestinal system: Abdomen is nondistended, soft and nontender.   Central nervous system: Alert and oriented. No focal neurological deficits.  Extremities: LLE with several open wounds, redness appears to be improving when compared to previous images   Psychiatry: Judgement and insight appear normal. Mood & affect appropriate.     Data Reviewed: I have personally reviewed following labs and imaging studies    CBC:  Recent Labs   Lab 06/05/20  0431 06/06/20  0332 06/07/20  0424 06/08/20  0425 06/09/20  0644   WBC 24.4* 20.6* 17.3* 14.3* 12.6*   HGB 13.3 13.5 12.6* 12.3* 12.3*   HCT 39.8 41.2 37.3* 36.3* 36.8*   MCV 86.9 87.7 87.1 87.9 87.4   PLT 455* 508* 517* 582* 633*     Basic Metabolic Panel:  Recent Labs   Lab 06/05/20  0431 06/06/20  0332 06/07/20  0424 06/08/20  0425 06/09/20  0644   NA 131* 134* 132* 130* 132*   K 3.8 4.1 3.9 3.9 3.9   CL 98 98 98 97* 97*   CO2 25 27 25 24 25    GLUCOSE 127* 141* 113* 112* 116*   BUN 18 18 16 14 16    CREATININE  1.32* 1.40* 1.18 1.17 1.21   CALCIUM 7.8* 8.0* 7.7* 8.0* 8.6*   MG 2.2 2.2 2.2 2.4 2.3     GFR:  Estimated Creatinine Clearance: 98.3 mL/min (by C-G formula based on SCr of 1.21 mg/dL).    Liver Function Tests:  No results for input(s): AST, ALT, ALKPHOS, BILITOT, PROT, ALBUMIN in the last 168 hours.    CBG:  No results for input(s): GLUCAP in the last 168 hours.    Recent Results (from the past 240 hour(s))   Blood culture (routine x 2)     Status: None    Collection Time: 05/31/20 10:14 AM    Specimen: BLOOD   Result Value Ref Range Status    Specimen Description BLOOD LEFT ASSIST CONTROL  Final    Special Requests   Final     BOTTLES DRAWN AEROBIC AND ANAEROBIC Blood Culture results may not be optimal due to an excessive volume of blood received in culture bottles    Culture   Final     NO GROWTH 5 DAYS  Performed at [facility], [address], [place], [place] [address]  Report Status 06/05/2020 FINAL  Final   Blood culture (routine x 2)     Status: None    Collection Time: 05/31/20  1:48 PM    Specimen: BLOOD   Result Value Ref Range Status    Specimen Description BLOOD BLOOD LEFT FOREARM  Final    Special Requests   Final     BOTTLES DRAWN AEROBIC AND ANAEROBIC Blood Culture adequate volume    Culture   Final     NO GROWTH 5 DAYS  Performed at [facility], [address] [place], [place] [address]     Report Status 06/05/2020 FINAL  Final   Respiratory Panel by RT PCR (Flu A&B, Covid) - Nasopharyngeal Swab     Status: None    Collection Time: 05/31/20  3:02 PM    Specimen: Nasopharyngeal Swab   Result Value Ref Range Status    SARS Coronavirus 2 by RT PCR NEGATIVE NEGATIVE Final     Comment: (NOTE)  SARS-CoV-2 target nucleic acids are NOT DETECTED.    The SARS-CoV-2 RNA is generally detectable in upper respiratoy  specimens during the acute phase of infection. The lowest  concentration of SARS-CoV-2 viral copies this assay can detect is  131 copies/mL. A negative result does not  preclude SARS-Cov-2  infection and should not be used as the sole basis for treatment or  other patient management decisions. A negative result may occur with   improper specimen collection/handling, submission of specimen other  than nasopharyngeal swab, presence of viral mutation(s) within the  areas targeted by this assay, and inadequate number of viral copies  (<131 copies/mL). A negative result must be combined with clinical  observations, patient history, and epidemiological information. The  expected result is Negative.    Fact Sheet for Patients:   https://www.moore.com/    Fact Sheet for Healthcare Providers:   https://www.young.biz/    This test is no t yet approved or cleared by the [place] FDA and   has been authorized for detection and/or diagnosis of SARS-CoV-2 by  FDA under an Emergency Use Authorization (EUA). This EUA will remain   in effect (meaning this test can be used) for the duration of the  COVID-19 declaration under Section 564(b)(1) of the Act, 21 U.S.C.  section 360bbb-3(b)(1), unless the authorization is terminated or  revoked sooner.       Influenza A by PCR NEGATIVE NEGATIVE Final    Influenza B by PCR NEGATIVE NEGATIVE Final     Comment: (NOTE)  The Xpert Xpress SARS-CoV-2/FLU/RSV assay is intended as an aid in   the diagnosis of influenza from Nasopharyngeal swab specimens and   should not be used as a sole basis for treatment. Nasal washings and   aspirates are unacceptable for Xpert Xpress SARS-CoV-2/FLU/RSV   testing.    Fact Sheet for Patients:  https://www.moore.com/    Fact Sheet for Healthcare Providers:  https://www.young.biz/    This test is not yet approved or cleared by the [place] FDA and   has been authorized for detection and/or diagnosis of SARS-CoV-2 by   FDA under an Emergency Use Authorization (EUA). This EUA will remain   in effect (meaning this test can be used) for the duration  of the   Covid-19 declaration under Section 564(b)(1) of the Act, 21   U.S.C. section 360bbb-3(b)(1), unless the authorization is   terminated or revoked.  Performed at [facility], [address], [place],  [place] [address]  Aerobic Culture (superficial specimen)     Status: None    Collection Time: 06/04/20 10:53 PM    Specimen: Leg; Wound   Result Value Ref Range Status    Specimen Description   Final     LEG LEFT  Performed at [facility], [address], [place], [place] [address]     Special Requests   Final     NONE  Performed at [facility], [address] [place], [place] [address]     Gram Stain   Final     RARE WBC PRESENT, PREDOMINANTLY PMN  NO ORGANISMS SEEN     Culture   Final     NO GROWTH 2 DAYS  Performed at [facility], [address], [place], [place] [address]     Report Status 06/07/2020 FINAL  Final       Radiology Studies:  No results found.    Scheduled Meds:  ? enoxaparin (LOVENOX) injection  0.5 mg/kg Subcutaneous Q24H   ? furosemide  40 mg Intravenous BID   ? hydrOXYzine  100 mg Oral QHS   ? lidocaine  1 patch Transdermal Q24H   ? polyethylene glycol  34 g Oral BID   ? senna-docusate  1 tablet Oral BID   ? traZODone  50 mg Oral QHS     Continuous Infusions:  ?  ceFAZolin (ANCEF) IV 2 g (06/09/20 1521)      LOS: 9 days     Time spent: over 30 min    Lacretia Nicks, MD  [facility]    To contact the attending provider between 7A-7P or the covering provider during after hours 7P-7A, please log into the web site [email or web address] and access using universal [facility] password for that web site. If you do not have the password, please call the hospital operator.    06/09/2020, 5:04 PM       Electronically signed by Zigmund Daniel., MD at 06/09/2020  5:17 PM EDT

## 2020-06-09 NOTE — Progress Notes (Signed)
Formatting of this note is different from the original.  Physical Therapy Treatment  Patient Details  Name: Thomas Browning  MRN: [id number]  DOB: 1966/05/18  Today's Date: 06/09/2020    History of Present Illness Pt is a 54 y.o. male presenting to hospital 10/25 with L leg redness, swelling, pain, and weapage.  Pt admitted with sepsis d/t L leg cellulitis, htn, hypokalemia, AKI, and cervicalgia (rigid lump posterior neck).  PMH includes htn.      PT Comments      Pt is making good progress with goals with ability to ambulate in hallway using his own BRW. Cues for safe ambulation. Prefers to sit in bed at end of session as able to elevate leg higher. Will continue to progress as able.     Follow Up Recommendations   Home health PT      Equipment Recommendations    (has BRW)     Recommendations for Other Services        Precautions / Restrictions Precautions  Precautions: Fall  Precaution Comments: LLE wound  Restrictions  Weight Bearing Restrictions: No      Mobility   Bed Mobility  Overal bed mobility: Independent              General bed mobility comments: able to easily transition to EOB. Once seated, pain increased with dependent position    Transfers  Overall transfer level: Needs assistance  Equipment used: Rolling walker (2 wheeled)  Transfers: Sit to/from Stand  Sit to Stand: Modified independent (Device/Increase time)          General transfer comment: upright posture noted with wide stance. RW used    Ambulation/Gait  Ambulation/Gait assistance: Supervision  Gait Distance (Feet): 90 Feet  Assistive device: Rolling walker (2 wheeled)  Gait Pattern/deviations: Step-to pattern      General Gait Details: antalgic step to gait pattern performed with increased WBing noted through B UE. Further distance deferred secondary to pain    Stairs              Wheelchair Mobility      Modified Rankin (Stroke Patients Only)        Balance Overall balance assessment: Needs assistance  Sitting-balance support: No upper  extremity supported;Feet supported  Sitting balance-Leahy Scale: Normal      Standing balance support: Bilateral upper extremity supported;During functional activity  Standing balance-Leahy Scale: Good                                Cognition Arousal/Alertness: Awake/alert  Behavior During Therapy: WFL for tasks assessed/performed  Overall Cognitive Status: Within Functional Limits for tasks assessed                                          Exercises Other Exercises  Other Exercises: supine ther-ex performed on B LE including SLRs, hip abd/add, and quad sets. All ther-ex performed x 10 reps with supervision.       General Comments          Pertinent Vitals/Pain Pain Assessment: 0-10  Pain Score: 9   Pain Location: LLE  Pain Descriptors / Indicators: Aching  Pain Intervention(s): Limited activity within patient's tolerance;Premedicated before session;Repositioned     Home Living  Prior Function             PT Goals (current goals can now be found in the care plan section) Acute Rehab PT Goals  Patient Stated Goal: to improve L LE pain and mobility  PT Goal Formulation: With patient  Time For Goal Achievement: 06/16/20  Potential to Achieve Goals: Good  Progress towards PT goals: Progressing toward goals      Frequency     Min 2X/week      PT Plan Current plan remains appropriate     Co-evaluation                AM-PAC PT "6 Clicks" Mobility    Outcome Measure   Help needed turning from your back to your side while in a flat bed without using bedrails?: None  Help needed moving from lying on your back to sitting on the side of a flat bed without using bedrails?: None  Help needed moving to and from a bed to a chair (including a wheelchair)?: A Little  Help needed standing up from a chair using your arms (e.g., wheelchair or bedside chair)?: A Little  Help needed to walk in hospital room?: A Little  Help needed climbing 3-5 steps with a railing? : A Little  6 Click Score: 20      End of Session  Equipment Utilized During Treatment: Gait belt  Activity Tolerance: Patient limited by pain  Patient left: in bed;with call bell/phone within reach  Nurse Communication: Mobility status  PT Visit Diagnosis: Other abnormalities of gait and mobility (R26.89);Difficulty in walking, not elsewhere classified (R26.2);Pain  Pain - Right/Left: Left  Pain - part of body: Leg      Time: 0857-0920  PT Time Calculation (min) (ACUTE ONLY): 23 min    Charges:  $Gait Training: 8-22 mins  $Therapeutic Exercise: 8-22 mins               Elizabeth Palau, PT, DPT  [phone]    Ray,Stephanie  06/09/2020, 1:45 PM      Electronically signed by Albertina Parr, PT at 06/09/2020  1:49 PM EDT

## 2020-06-09 NOTE — Unmapped (Signed)
Formatting of this note might be different from the original.  WOC Nurse wound follow up  Orders clarified with bedside RN this AM.  Due to severe sepsis and cellulitis, will proceed slowly with compression.  At this time, will initiate ace wraps for minimal compression and leg elevation.  Patient will need vascular workup, outpatient, and clear diagnosis (Venous insufficiency vs lymphedema) to increase compression safely.    Will not follow at this time.  Please re-consult if needed.   Maple Hudson MSN, RN, FNP-BC CWON  Wound, Ostomy, Continence Nurse  Pager [phone]    Electronically signed by Alphonzo Dublin, FNP at 06/09/2020 10:41 AM EDT

## 2020-06-10 ENCOUNTER — Inpatient Hospital Stay
Admit: 2020-06-10 | Discharge: 2020-06-10 | Disposition: A | Discharge status: Home / Self Care | Payer: Self-pay | Attending: Family Medicine | Admitting: Family Medicine

## 2020-06-10 DIAGNOSIS — R609 Edema, unspecified: Secondary | ICD-10-CM

## 2020-06-10 LAB — COMPREHENSIVE METABOLIC PANEL
ALT: 33 U/L (ref 0–44)
AST: 27 U/L (ref 15–41)
Albumin: 2.7 g/dL — ABNORMAL LOW (ref 3.5–5.0)
Alkaline Phosphatase: 61 U/L (ref 38–126)
Anion gap: 7 (ref 5–15)
BUN: 18 mg/dL (ref 6–20)
CO2: 25 mmol/L (ref 22–32)
Calcium: 8.4 mg/dL — ABNORMAL LOW (ref 8.9–10.3)
Chloride: 98 mmol/L (ref 98–111)
Creatinine, Ser: 1.15 mg/dL (ref 0.61–1.24)
GFR, Estimated: >60 mL/min (ref >60)
Glucose, Bld: 135 mg/dL — ABNORMAL HIGH (ref 70–99)
Potassium: 4.1 mmol/L (ref 3.5–5.1)
Sodium: 130 mmol/L — ABNORMAL LOW (ref 135–145)
Total Bilirubin: 0.7 mg/dL (ref 0.3–1.2)
Total Protein: 8.4 g/dL — ABNORMAL HIGH (ref 6.5–8.1)

## 2020-06-10 LAB — CBC WITH DIFFERENTIAL/PLATELET
Abs Immature Granulocytes: 0.18 10*3/uL — ABNORMAL HIGH (ref 0.00–0.07)
Basophils Absolute: 0.1 10*3/uL (ref 0.0–0.1)
Basophils Relative: 1 %
Eosinophils Absolute: 0.1 10*3/uL (ref 0.0–0.5)
Eosinophils Relative: 1 %
HCT: 37.7 % — ABNORMAL LOW (ref 39.0–52.0)
Hemoglobin: 12.5 g/dL — ABNORMAL LOW (ref 13.0–17.0)
Immature Granulocytes: 2 %
Lymphocytes Relative: 22 %
Lymphs Abs: 2.7 10*3/uL (ref 0.7–4.0)
MCH: 28.9 pg (ref 26.0–34.0)
MCHC: 33.2 g/dL (ref 30.0–36.0)
MCV: 87.3 fL (ref 80.0–100.0)
Monocytes Absolute: 1.2 10*3/uL — ABNORMAL HIGH (ref 0.1–1.0)
Monocytes Relative: 9 %
Neutro Abs: 8.2 10*3/uL — ABNORMAL HIGH (ref 1.7–7.7)
Neutrophils Relative %: 65 %
Platelets: 716 10*3/uL — ABNORMAL HIGH (ref 150–400)
RBC: 4.32 MIL/uL (ref 4.22–5.81)
RDW: 13.6 % (ref 11.5–15.5)
WBC: 12.4 10*3/uL — ABNORMAL HIGH (ref 4.0–10.5)
nRBC: 0 % (ref 0.0–0.2)

## 2020-06-10 LAB — PHOSPHORUS: Phosphorus: 3.9 mg/dL (ref 2.5–4.6)

## 2020-06-10 LAB — MAGNESIUM: Magnesium: 2.4 mg/dL (ref 1.7–2.4)

## 2020-06-10 MED ORDER — METOLAZONE 5 MG PO TABS
5.0000 mg | ORAL_TABLET | Freq: Once | ORAL | Status: AC
  Administered 2020-06-10: 5 mg via ORAL
  Filled 2020-06-10: qty 1

## 2020-06-10 MED ORDER — PERFLUTREN LIPID MICROSPHERE
1.0000 mL | INTRAVENOUS | Status: AC | PRN
  Administered 2020-06-10: 2 mL via INTRAVENOUS
  Filled 2020-06-10: qty 10

## 2020-06-10 MED ORDER — DIPHENHYDRAMINE HCL 25 MG PO CAPS
50.0000 mg | ORAL_CAPSULE | Freq: Four times a day (QID) | ORAL | Status: DC | PRN
  Administered 2020-06-10 – 2020-06-15 (×7): 50 mg via ORAL
  Filled 2020-06-10 (×6): qty 2

## 2020-06-10 NOTE — Progress Notes (Signed)
ID Patient doing well Says the leg less swollen Less painful  Patient Vitals for the past 24 hrs:  BP Temp Temp src Pulse Resp SpO2  06/10/20 1214 128/81 98 F (36.7 C) Oral 89 18 99 %  06/10/20 0806 130/75 98.3 F (36.8 C) -- 84 18 97 %  06/10/20 0510 135/74 98.9 F (37.2 C) -- 85 17 96 %  06/10/20 0040 (!) 143/74 98.8 F (37.1 C) Oral 88 18 95 %  06/09/20 1946 121/65 98.8 F (37.1 C) Oral 90 18 96 %   Awake and alert Chest clear to auscultation Heart sounds S1-S2 Abdomen soft Left leg swelling reduced, dressing not removed as it was just changed  Labs CBC Latest Ref Rng & Units 06/10/2020 06/09/2020 06/08/2020  WBC 4.0 - 10.5 K/uL 12.4(H) 12.6(H) 14.3(H)  Hemoglobin 13.0 - 17.0 g/dL 12.5(L) 12.3(L) 12.3(L)  Hematocrit 39 - 52 % 37.7(L) 36.8(L) 36.3(L)  Platelets 150 - 400 K/uL 716(H) 633(H) 582(H)    CMP Latest Ref Rng & Units 06/10/2020 06/09/2020 06/08/2020  Glucose 70 - 99 mg/dL 712(W) 580(D) 983(J)  BUN 6 - 20 mg/dL 18 16 14   Creatinine 0.61 - 1.24 mg/dL 8.25 0.53  Sodium 135 - 145 mmol/L 130(L) 132(L) 130(L)  Potassium 3.5 - 5.1 mmol/L 4.1 3.9 3.9  Chloride 98 - 111 mmol/L 98 97(L) 97(L)  CO2 22 - 32 mmol/L 25 25 24   Calcium 8.9 - 10.3 mg/dL 9.76) ) 7.3(A)  Total Protein 6.5 - 8.1 g/dL 1.9(F) - -  Total Bilirubin 0.3 - 1.2 mg/dL 0.7 - -  Alkaline Phos 38 - 126 U/L 61 - -  AST 15 - 41 U/L 27 - -  ALT 0 - 44 U/L 33 - -      Impression/recommendation Severe cellulitis of the left leg.  Patient has been in hospital since 10/25.  He initially got vancomycin and ceftriaxone and clindamycin and then it was switched to linezolid.  Currently on cefazolin  - Day 10   Both MRI did not reveal any deep infection.  Vascular saw the patient and did superficial debridement of the blisters.  Hypertension on furosemide  Leukocytosis is resolved Patient still has severe thrombocytosis which is worsening Will evaluate the wound tomorrow to see whether further  imaging is needed.

## 2020-06-10 NOTE — Progress Notes (Signed)
PROGRESS NOTE    Stephen [name]  NRW:[id number] DOB: 1965/12/22 DOA: 05/31/2020 PCP: Patient, No Pcp Per   Chief Complaint  Patient presents with  . Leg Pain    Brief Narrative:  54 y.o.malewith medical history significant ofHTN,who presents with left leg pain, swelling, weeping.  Patient states that his left leg pain started approximately 5 days ago, which has been progressively worsening. 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  Ultrasound on admission negative.  Patient presented with left leg complaints also complains of constipation neck pain.  Started on broad-spectrum IV antibiotics on admission.  Patient's main complaints are constipation and neck pain.  KUB negative for obstruction.  X-ray C-spine negative for fracture.  Assessment & Plan:   Principal Problem:   Left leg cellulitis Active Problems:   Hypertension   Sepsis (HCC)   Hypokalemia   AKI (acute kidney injury) (HCC)   Cellulitis of left leg  Severe Sepsis due to left leg cellulitis: Sepsis ruled in  Left lower extremity venous Doppler isnegative for DVT MRI 10/29 with diffuse and marked subcutaneous soft tissue swelling/edema/fluid - numerous skin lesions, likely blisters.  Possible developing abscesses.  No myofasciitis or pyomyositis.  No septic arthritis or osteomyelitis. ESR, CRP elevated, follow  started on vanc (10/25 - 10/27), ceftriaxone (10/25 - 11/2).  Clindamycin 10/26-26.  Linezolid 10/28-11/2.  Ancef 11/3 - present.  --ID consulted, collected fluid from blister for culture, NGTD. --Vascular surgery performed local debridement at bedside 10/30, signed off --TOC attempted to make referral to outpatient wound care center, however, long wait time. Plan: --cont cefazolin, per  ID --elevation on 6 pillows. --wound care consult -> dressing orders in, ace wraps for minimal compression and leg elevation.  --Needs vascular w/u outpatient and clear diagnosis (venous insufficiency vs lymphedema).    # Severe LLE edema # possible lymphedema --LLE edema worsened with development of a layer of edema with multiple blisters, likely due to IVF given since presentation. --MIVF d/c'ed on 10/27.  Had been receiving IV lasix 40 mg BID.  Held on 10/31 due to Cr increase, resumed on 11/1. PLAN: - follow echocardiogram  --cont IV lasix 40 mg BID while Cr stable --add metolazone  --outpatient followup with vascular for ABI and venous duplex (rule out reflux) --Outpatient followup with vascular for [place] wrap weekly  Constipation Patient states he is chronically constipated and takes multiple laxatives with minimal relief Likely owing to dietary indiscretions considering his occupation as a [employer] No obstruction or ileus noted on imaging Moderate stool burden --s/p high-dose miralax Plan: --cont senna --Miralax 34 g BID  Hypertension patient not taking medications at home.   BP acceptable.  Hypokalemia: --replete PRN  AKI (acute kidney injury) (HCC) Creatinine 1.49, BUN 25, no baseline creatinine available, may be due to dehydration versus sepsis. 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  A1c 6.3. --BG within inpatient goal.  No need for fingersticks or SSI.  Thrombocythemia, worsening --plt normal on presentation, now trending up, 716 now.  Likely reactive. --cont  to trend.   DVT prophylaxis:  lovenox Code Status: full  Family Communication: mother updated at bedside today  Disposition:   Status is: Inpatient  Remains inpatient appropriate because:Inpatient level of care appropriate due to severity of illness   Dispo: The patient is from: Home              Anticipated d/c is to: Home with HHPT              Anticipated d/c date is: > 3 days              Patient currently is not medically stable to d/c.  Still on IV abx, per ID.   Consultants:   ID  vascular  Procedures:  Local debridement 10/30  LE [place]  IMPRESSION: No evidence of deep venous thrombosis in the left lower extremity. Right common femoral vein also present. Diffuse left lower extremity soft tissue edema noted. Antimicrobials: Anti-infectives (From admission, onward)   Start     Dose/Rate Route Frequency Ordered Stop   06/09/20 0800  ceFAZolin (ANCEF) IVPB 2g/100 mL premix        2 g 200 mL/hr over 30 Minutes Intravenous Every 8 hours 06/09/20 0036     06/07/20 2000  linezolid (ZYVOX) tablet 600 mg  Status:  Discontinued        600 mg Oral Every 12 hours 06/07/20 1431 06/09/20 0036   06/03/20 1800  linezolid (ZYVOX) IVPB 600 mg  Status:  Discontinued        600 mg 300 mL/hr over 60 Minutes Intravenous Every 12 hours 06/03/20 1655 06/07/20 1431   06/01/20 1500  clindamycin (CLEOCIN) IVPB 900 mg  Status:  Discontinued        900 mg 100 mL/hr over 30 Minutes Intravenous Every 8 hours 06/01/20 1334 06/02/20 1302   06/01/20 0400  vancomycin (VANCOCIN) IVPB 1000 mg/200 mL premix  Status:  Discontinued        1,000 mg 200 mL/hr over 60 Minutes Intravenous Every 12 hours 05/31/20 1658 06/03/20 1655   05/31/20 1500  vancomycin (VANCOREADY) IVPB 1500 mg/300 mL        1,500 mg 150 mL/hr over 120 Minutes Intravenous  Once 05/31/20 1358 05/31/20 1815   05/31/20 1415  cefTRIAXone (ROCEPHIN) 2 g in sodium chloride 0.9 % 100 mL IVPB  Status:  Discontinued        2 g 200 mL/hr over 30 Minutes Intravenous  Every 24 hours 05/31/20 1404 06/08/20 1433   05/31/20 1215  vancomycin (VANCOCIN) IVPB 1000 mg/200 mL premix        1,000 mg 200 mL/hr over 60 Minutes Intravenous  Once 05/31/20 1204 05/31/20 1500     Subjective: Complained of itching today.  Urine output decreased in response to the same doses of IV lasix.     Objective: Vitals:   06/10/20 0510 06/10/20 0806 06/10/20 1214 06/10/20 1542  BP: 135/74 130/75 128/81 128/75  Pulse: 85 84 89 85  Resp: $Remo'17 18 18 16  'sYIck$ Temp: 98.9 F (37.2 C) 98.3 F (36.8 C) 98 F (36.7 C) 98.9 F (37.2 C)  TempSrc:   Oral Oral  SpO2: 96% 97% 99% 99%  Weight:      Height:        Intake/Output Summary (Last 24 hours) at 06/10/2020 1619 Last data filed at 06/10/2020 1547 Gross per 24 hour  Intake 440 ml  Output 2800 ml  Net -2360 ml   Filed Weights   05/31/20 1004  Weight: 136.1 kg    Examination:  Constitutional: NAD, AAOx3 HEENT: conjunctivae and lids normal, EOMI CV: No cyanosis.   RESP: normal respiratory effort, on RA Extremities: LLE edema improved.  Starting to form scabs.  ACE wrap loosely applied SKIN: warm, dry  Neuro: II - XII grossly intact.   Psych: Normal mood and affect.  Appropriate judgement and reason   Data Reviewed: I have personally reviewed following labs and imaging studies  CBC: Recent Labs  Lab 06/06/20 0332 06/07/20 0424 06/08/20 0425 06/09/20 0644 06/10/20 0505  WBC 20.6* 17.3* 14.3* 12.6* 12.4*  NEUTROABS  --   --   --   --  8.2*  HGB 13.5 12.6* 12.3* 12.3* 12.5*  HCT 41.2 37.3* 36.3* 36.8* 37.7*  MCV 87.7 87.1 87.9 87.4 87.3  PLT 508* 517* 582* 633* 716*    Basic Metabolic Panel: Recent Labs  Lab 06/06/20 0332 06/07/20 0424 06/08/20 0425 06/09/20 0644 06/10/20 0505  NA 134* 132* 130* 132* 130*  K 4.1 3.9 3.9 3.9 4.1  CL 98 98 97* 97* 98  CO2 $Re'27 25 24 25 25  'BcS$ GLUCOSE 141* 113* 112* 116* 135*  BUN $Re'18 16 14 16 18  'qOi$ CREATININE 1.40* 1.18 1.17 1.21 1.15  CALCIUM 8.0* 7.7* 8.0* 8.6* 8.4*  MG  2.2 2.2 2.4 2.3 2.4  PHOS  --   --   --   --  3.9    GFR: Estimated Creatinine Clearance: 103.4 mL/min (by C-G formula based on SCr of 1.15 mg/dL).  Liver Function Tests: Recent Labs  Lab 06/10/20 0505  AST 27  ALT 33  ALKPHOS 61  BILITOT 0.7  PROT 8.4*  ALBUMIN 2.7*    CBG: No results for input(s): GLUCAP in the last 168 hours.   Recent Results (from the past 240 hour(s))  Aerobic Culture (superficial specimen)     Status: None   Collection Time: 06/04/20 10:53 PM   Specimen: Leg; Wound  Result Value Ref Range Status   Specimen Description   Final    LEG LEFT Performed at [facility], [address] [place], [place] [address]    Special Requests   Final    NONE Performed at [facility], [address] [place], [place] [address]    Gram Stain   Final    RARE WBC PRESENT, PREDOMINANTLY PMN NO ORGANISMS SEEN    Culture   Final    NO GROWTH 2 DAYS Performed at [facility], [place] [address] [place],  [address]    Report Status 06/07/2020 FINAL  Final         Radiology Studies: No results found.      Scheduled Meds: . enoxaparin (LOVENOX) injection  0.5 mg/kg Subcutaneous Q24H  . furosemide  40 mg Intravenous BID  . hydrOXYzine  100 mg Oral QHS  . lidocaine  1 patch Transdermal Q24H  . polyethylene glycol  34 g Oral BID  . senna-docusate  1 tablet Oral BID  . traZODone  50 mg Oral QHS   Continuous Infusions: .  ceFAZolin (ANCEF) IV 2 g (06/10/20 1333)     LOS: 10 days     Enzo Bi, MD [facility]   To contact the attending provider between 7A-7P or the covering provider during after hours 7P-7A, please log into the web site [email or web address] and access using universal [facility] password for that web site. If you do not have the password, please call the hospital operator.  06/10/2020, 4:19  PM

## 2020-06-10 NOTE — Progress Notes (Signed)
Formatting of this note is different from the original.  ID  Patient doing well  Says the leg less swollen  Less painful    Patient Vitals for the past 24 hrs:   BP Temp Temp src Pulse Resp SpO2   06/10/20 1214 128/81 98 F (36.7 C) Oral 89 18 99 %   06/10/20 0806 130/75 98.3 F (36.8 C) -- 84 18 97 %   06/10/20 0510 135/74 98.9 F (37.2 C) -- 85 17 96 %   06/10/20 0040 (!) 143/74 98.8 F (37.1 C) Oral 88 18 95 %   06/09/20 1946 121/65 98.8 F (37.1 C) Oral 90 18 96 %     Awake and alert  Chest clear to auscultation  Heart sounds S1-S2  Abdomen soft  Left leg swelling reduced, dressing not removed as it was just changed    Labs  CBC Latest Ref Rng & Units 06/10/2020 06/09/2020 06/08/2020   WBC 4.0 - 10.5 K/uL 12.4(H) 12.6(H) 14.3(H)   Hemoglobin 13.0 - 17.0 g/dL 12.5(L) 12.3(L) 12.3(L)   Hematocrit 39 - 52 % 37.7(L) 36.8(L) 36.3(L)   Platelets 150 - 400 K/uL 716(H) 633(H) 582(H)     CMP Latest Ref Rng & Units 06/10/2020 06/09/2020 06/08/2020   Glucose 70 - 99 mg/dL 161(W) 960(A) 540(J)   BUN 6 - 20 mg/dL 18 16 14    Creatinine 0.61 - 1.24 mg/dL 8.11 9.14 7.82   Sodium 135 - 145 mmol/L 130(L) 132(L) 130(L)   Potassium 3.5 - 5.1 mmol/L 4.1 3.9 3.9   Chloride 98 - 111 mmol/L 98 97(L) 97(L)   CO2 22 - 32 mmol/L 25 25 24    Calcium 8.9 - 10.3 mg/dL 9.5(A) 2.1(H) 0.8(M)   Total Protein 6.5 - 8.1 g/dL 5.7(Q) - -   Total Bilirubin 0.3 - 1.2 mg/dL 0.7 - -   Alkaline Phos 38 - 126 U/L 61 - -   AST 15 - 41 U/L 27 - -   ALT 0 - 44 U/L 33 - -     Impression/recommendation  Severe cellulitis of the left leg.  Patient has been in hospital since 10/25.  He initially got vancomycin and ceftriaxone and clindamycin and then it was switched to linezolid.  Currently on cefazolin  - Day 10     Both MRI did not reveal any deep infection.  Vascular saw the patient and did superficial debridement of the blisters.    Hypertension on furosemide    Leukocytosis is resolved  Patient still has severe thrombocytosis which is worsening  Will evaluate  the wound tomorrow to see whether further imaging is needed.  Electronically signed by Lynn Ito, MD at 06/10/2020  7:31 PM EDT

## 2020-06-10 NOTE — Progress Notes (Signed)
Formatting of this note is different from the original.  Images from the original note were not included.    PROGRESS NOTE    Thomas Browning  ZOX:[id number] DOB: 16-Apr-1966 DOA: 05/31/2020  PCP: Patient, No Pcp Per     Chief Complaint   Patient presents with   ? Leg Pain     Brief Narrative:   54 y.o.malewith medical history significant ofHTN,who presents with left leg pain, swelling, weeping.    Patient states that his left leg pain started approximately 5 days ago, which has been progressively worsening. 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  Ultrasound on admission negative.    Patient presented with left leg complaints also complains of constipation neck pain.  Started on broad-spectrum IV antibiotics on admission.    Patient's main complaints are constipation and neck pain.  KUB negative for obstruction.  X-ray C-spine negative for fracture.    Assessment & Plan:    Principal Problem:    Left leg cellulitis  Active Problems:    Hypertension    Sepsis (HCC)    Hypokalemia    AKI (acute kidney injury) (HCC)    Cellulitis of left leg    Severe Sepsis due to left leg cellulitis:  Sepsis ruled in   Left lower extremity venous Doppler isnegative for DVT  MRI 10/29 with diffuse and marked subcutaneous soft tissue swelling/edema/fluid - numerous skin lesions, likely blisters.  Possible developing abscesses.  No myofasciitis or pyomyositis.  No septic arthritis or osteomyelitis.  ESR, CRP elevated, follow   started on vanc (10/25 - 10/27), ceftriaxone (10/25 - 11/2).  Clindamycin 10/26-26.  Linezolid 10/28-11/2.  Ancef 11/3 - present.   --ID consulted, collected fluid from blister for culture, NGTD.  --Vascular surgery performed local debridement at bedside 10/30, signed off  --TOC attempted to make referral  to outpatient wound care center, however, long wait time.  Plan:  --cont cefazolin, per ID  --elevation on 6 pillows.  --wound care consult -> dressing orders in, ace wraps for minimal compression and leg elevation.   --Needs vascular w/u outpatient and clear diagnosis (venous insufficiency vs lymphedema).      # Severe LLE edema  # possible lymphedema  --LLE edema worsened with development of a layer of edema with multiple blisters, likely due to IVF given since presentation.  --MIVF d/c'ed on 10/27.  Had been receiving IV lasix 40 mg BID.  Held on 10/31 due to Cr increase, resumed on 11/1.  PLAN:  - follow echocardiogram   --cont IV lasix 40 mg BID while Cr stable  --add metolazone   --outpatient followup with vascular for ABI and venous duplex (rule out reflux)  --Outpatient followup with vascular for [place] wrap weekly    Constipation  Patient states he is chronically constipated and takes multiple laxatives with minimal relief  Likely owing to dietary indiscretions considering his occupation as a [employer]  No obstruction or ileus noted on imaging  Moderate stool burden  --s/p high-dose miralax  Plan:  --cont senna  --Miralax 34 g BID    Hypertension  patient not taking medications at home.    BP acceptable.    Hypokalemia:  --replete PRN    AKI (acute kidney injury) (HCC)  Creatinine 1.49, BUN 25, no baseline creatinine available,  may be due to dehydration versus sepsis.  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  A1c 6.3.  --BG within inpatient goal.  No need for fingersticks or SSI.    Thrombocythemia, worsening  --plt normal on  presentation, now trending up, 716 now.  Likely reactive.  --cont to trend.    DVT prophylaxis: lovenox  Code Status: full   Family Communication: mother updated at bedside today   Disposition:     Status is: Inpatient    Remains inpatient appropriate because:Inpatient level of care appropriate due to severity of illness    Dispo: The patient is from: Home               Anticipated d/c is to: Home with HHPT               Anticipated d/c date is: > 3 days               Patient currently is not medically stable to d/c.   Still on IV abx, per ID.    Consultants:    ID   vascular    Procedures:   Local debridement 10/30    LE [place]    IMPRESSION:  No evidence of deep venous thrombosis in the left lower extremity.  Right common femoral vein also present. Diffuse left lower extremity  soft tissue edema noted.  Antimicrobials:  Anti-infectives (From admission, onward)    Start     Dose/Rate Route Frequency Ordered Stop    06/09/20 0800  ceFAZolin (ANCEF) IVPB 2g/100 mL premix         2 g  200 mL/hr over 30 Minutes Intravenous Every 8 hours 06/09/20 0036      06/07/20 2000  linezolid (ZYVOX) tablet 600 mg  Status:  Discontinued         600 mg Oral Every 12 hours 06/07/20 1431 06/09/20 0036    06/03/20 1800  linezolid (ZYVOX) IVPB 600 mg  Status:  Discontinued         600 mg  300 mL/hr over 60 Minutes Intravenous Every 12 hours 06/03/20 1655 06/07/20 1431    06/01/20 1500  clindamycin (CLEOCIN) IVPB 900 mg  Status:  Discontinued         900 mg  100 mL/hr over 30 Minutes Intravenous Every 8 hours 06/01/20 1334 06/02/20 1302    06/01/20 0400  vancomycin (VANCOCIN) IVPB 1000 mg/200 mL premix  Status:  Discontinued         1,000 mg  200 mL/hr over 60 Minutes Intravenous Every 12 hours 05/31/20 1658 06/03/20 1655    05/31/20 1500  vancomycin (VANCOREADY) IVPB 1500 mg/300 mL         1,500 mg  150 mL/hr over 120 Minutes Intravenous  Once 05/31/20 1358 05/31/20 1815    05/31/20 1415  cefTRIAXone (ROCEPHIN) 2 g in sodium chloride 0.9  % 100 mL IVPB  Status:  Discontinued         2 g  200 mL/hr over 30 Minutes Intravenous Every 24 hours 05/31/20 1404 06/08/20 1433    05/31/20 1215  vancomycin (VANCOCIN) IVPB 1000  mg/200 mL premix         1,000 mg  200 mL/hr over 60 Minutes Intravenous  Once 05/31/20 1204 05/31/20 1500       Subjective:  Complained of itching today.  Urine output decreased in response to the same doses of IV lasix.      Objective:  Vitals:    06/10/20 0510 06/10/20 0806 06/10/20 1214 06/10/20 1542   BP: 135/74 130/75 128/81 128/75   Pulse: 85 84 89 85   Resp: 17 18 18 16    Temp: 98.9 F (37.2 C) 98.3 F (36.8 C) 98 F (36.7 C) 98.9 F (37.2 C)   TempSrc:   Oral Oral   SpO2: 96% 97% 99% 99%   Weight:       Height:         Intake/Output Summary (Last 24 hours) at 06/10/2020 1619  Last data filed at 06/10/2020 1547  Gross per 24 hour   Intake 440 ml   Output 2800 ml   Net -2360 ml     Filed Weights    05/31/20 1004   Weight: 136.1 kg     Examination:    Constitutional: NAD, AAOx3  HEENT: conjunctivae and lids normal, EOMI  CV: No cyanosis.    RESP: normal respiratory effort, on RA  Extremities: LLE edema improved.  Starting to form scabs.  ACE wrap loosely applied  SKIN: warm, dry   Neuro: II - XII grossly intact.    Psych: Normal mood and affect.  Appropriate judgement and reason    Data Reviewed: I have personally reviewed following labs and imaging studies    CBC:  Recent Labs   Lab 06/06/20  0332 06/07/20  0424 06/08/20  0425 06/09/20  0644 06/10/20  0505   WBC 20.6* 17.3* 14.3* 12.6* 12.4*   NEUTROABS  --   --   --   --  8.2*   HGB 13.5 12.6* 12.3* 12.3* 12.5*   HCT 41.2 37.3* 36.3* 36.8* 37.7*   MCV 87.7 87.1 87.9 87.4 87.3   PLT 508* 517* 582* 633* 716*     Basic Metabolic Panel:  Recent Labs   Lab 06/06/20  0332 06/07/20  0424 06/08/20  0425 06/09/20  0644 06/10/20  0505   NA 134* 132* 130* 132* 130*   K 4.1 3.9 3.9 3.9 4.1   CL 98 98 97* 97* 98   CO2 27 25 24 25 25    GLUCOSE 141* 113* 112* 116* 135*   BUN 18 16 14 16 18     CREATININE 1.40* 1.18 1.17 1.21 1.15   CALCIUM 8.0* 7.7* 8.0* 8.6* 8.4*   MG 2.2 2.2 2.4 2.3 2.4   PHOS  --   --   --   --  3.9     GFR:  Estimated Creatinine Clearance: 103.4 mL/min (by C-G formula based on SCr of 1.15 mg/dL).    Liver Function Tests:  Recent Labs   Lab 06/10/20  0505   AST 27   ALT 33   ALKPHOS 61   BILITOT 0.7   PROT 8.4*   ALBUMIN 2.7*     CBG:  No results for input(s): GLUCAP in the last 168 hours.    Recent Results (from the past 240 hour(s))   Aerobic Culture (superficial specimen)     Status: None    Collection Time: 06/04/20 10:53 PM    Specimen: Leg; Wound   Result Value Ref Range Status    Specimen Description  Final     LEG LEFT  Performed at [facility], [address] [place], [place] [address]     Special Requests   Final     NONE  Performed at [facility], [address] [place], [place] [address]     Gram Stain   Final     RARE WBC PRESENT, PREDOMINANTLY PMN  NO ORGANISMS SEEN     Culture   Final     NO GROWTH 2 DAYS  Performed at [facility], [address] [place],  [address]     Report Status 06/07/2020 FINAL  Final       Radiology Studies:  No results found.    Scheduled Meds:  ? enoxaparin (LOVENOX) injection  0.5 mg/kg Subcutaneous Q24H   ? furosemide  40 mg Intravenous BID   ? hydrOXYzine  100 mg Oral QHS   ? lidocaine  1 patch Transdermal Q24H   ? polyethylene glycol  34 g Oral BID   ? senna-docusate  1 tablet Oral BID   ? traZODone  50 mg Oral QHS     Continuous Infusions:  ?  ceFAZolin (ANCEF) IV 2 g (06/10/20 1333)      LOS: 10 days     Darlin Priestly, MD  [facility]    To contact the attending provider between 7A-7P or the covering provider during after hours 7P-7A, please log into the web site [email or web address] and access using universal [facility] password for that web site. If you do not have the password, please call the hospital operator.    06/10/2020, 4:19 PM     Electronically signed by Darlin Priestly, MD at 06/11/2020  4:45 AM  EDT

## 2020-06-11 LAB — ECHOCARDIOGRAM COMPLETE
AR max vel: 3.39 cm2
AV Area VTI: 3.51 cm2
AV Area mean vel: 3.19 cm2
AV Mean grad: 5 mmHg
AV Peak grad: 9.2 mmHg
Ao pk vel: 1.52 m/s
Area-P 1/2: 3.48 cm2
Calc EF: 48.8 %
Height: 71 in
S' Lateral: 3.37 cm
Single Plane A2C EF: 47.3 %
Single Plane A4C EF: 49 %
Weight: 4800 oz

## 2020-06-11 LAB — BASIC METABOLIC PANEL
Anion gap: 11 (ref 5–15)
BUN: 19 mg/dL (ref 6–20)
CO2: 25 mmol/L (ref 22–32)
Calcium: 8.9 mg/dL (ref 8.9–10.3)
Chloride: 93 mmol/L — ABNORMAL LOW (ref 98–111)
Creatinine, Ser: 1.47 mg/dL — ABNORMAL HIGH (ref 0.61–1.24)
GFR, Estimated: 56 mL/min — ABNORMAL LOW (ref >60)
Glucose, Bld: 113 mg/dL — ABNORMAL HIGH (ref 70–99)
Potassium: 3.5 mmol/L (ref 3.5–5.1)
Sodium: 129 mmol/L — ABNORMAL LOW (ref 135–145)

## 2020-06-11 LAB — CBC
HCT: 39.3 % (ref 39.0–52.0)
Hemoglobin: 13.1 g/dL (ref 13.0–17.0)
MCH: 28.7 pg (ref 26.0–34.0)
MCHC: 33.3 g/dL (ref 30.0–36.0)
MCV: 86.2 fL (ref 80.0–100.0)
Platelets: 734 10*3/uL — ABNORMAL HIGH (ref 150–400)
RBC: 4.56 MIL/uL (ref 4.22–5.81)
RDW: 13.9 % (ref 11.5–15.5)
WBC: 11.2 10*3/uL — ABNORMAL HIGH (ref 4.0–10.5)
nRBC: 0 % (ref 0.0–0.2)

## 2020-06-11 LAB — MAGNESIUM: Magnesium: 2.2 mg/dL (ref 1.7–2.4)

## 2020-06-11 LAB — SEDIMENTATION RATE: Sed Rate: 95 mm/hr — ABNORMAL HIGH (ref 0–20)

## 2020-06-11 LAB — C-REACTIVE PROTEIN: CRP: 8.1 mg/dL — ABNORMAL HIGH (ref <1.0)

## 2020-06-11 MED ORDER — METOLAZONE 5 MG PO TABS
5.0000 mg | ORAL_TABLET | Freq: Every day | ORAL | Status: DC
  Administered 2020-06-11: 5 mg via ORAL
  Filled 2020-06-11 (×2): qty 1

## 2020-06-11 MED ORDER — POTASSIUM CHLORIDE CRYS ER 20 MEQ PO TBCR
20.0000 meq | EXTENDED_RELEASE_TABLET | Freq: Once | ORAL | Status: AC
  Administered 2020-06-11: 20 meq via ORAL
  Filled 2020-06-11: qty 1

## 2020-06-11 NOTE — Progress Notes (Addendum)
PROGRESS NOTE    Stephen [name]  ZHG:[id number] DOB: 10/05/1965 DOA: 05/31/2020 PCP: Patient, No Pcp Per   Chief Complaint  Patient presents with  . Leg Pain    Brief Narrative:  54 y.o.malewith medical history significant ofHTN,who presents with left leg pain, swelling, weeping.  Patient states that his left leg pain started approximately 5 days ago, which has been progressively worsening. 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  Ultrasound on admission negative.  Patient presented with left leg complaints also complains of constipation neck pain.  Started on broad-spectrum IV antibiotics on admission.  Patient's main complaints are constipation and neck pain.  KUB negative for obstruction.  X-ray C-spine negative for fracture.  Assessment & Plan:   Principal Problem:   Left leg cellulitis Active Problems:   Hypertension   Sepsis ([place])   Hypokalemia   AKI (acute kidney injury) ([place])   Cellulitis of left leg  Severe Sepsis due to left leg cellulitis: Sepsis ruled in  Left lower extremity venous Doppler isnegative for DVT MRI 10/29 with diffuse and marked subcutaneous soft tissue swelling/edema/fluid - numerous skin lesions, likely blisters.  Possible developing abscesses.  No myofasciitis or pyomyositis.  No septic arthritis or osteomyelitis. ESR, CRP elevated, follow  started on vanc (10/25 - 10/27), ceftriaxone (10/25 - 11/2).  Clindamycin 10/26-26.  Linezolid 10/28-11/2.  Ancef 11/3 - present.  --ID consulted, collected fluid from blister for culture, NGTD. --Vascular surgery performed local debridement at bedside 10/30, signed off --TOC attempted to make referral to outpatient wound care center, however, long wait time. Plan: --continue cefazolin, appreciate  ID recs --elevation on 6 pillows. --wound care consult -> recommending ace wraps for minimal compression and leg elevation.  Needs vascular w/u outpatient and clear diagnosis (venous insufficiency vs lymphedema).   --Will follow up with outpatient vascular clinic after discharge  # Severe LLE edema # possible lymphedema --LLE edema worsened with development of Dimond Crotty layer of edema with multiple blisters, likely due to IVF given since presentation. --MIVF d/c'ed on 10/27.  Had been receiving IV lasix 40 mg BID.  Held on 10/31 due to Cr increase, resumed on 11/1. PLAN: - follow echocardiogram - EF 45-50%, no RWMA, diastolic dysfunction  --hold lasix with bump in creatinine today.  Stop metolazone. --outpatient followup with vascular for ABI and venous duplex (rule out reflux) --Outpatient followup with vascular for [place] wrap weekly  AKI (acute kidney injury) ([place]) Creatinine 1.49, BUN 25, no baseline creatinine available, may be due to dehydration versus sepsis. 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   BP acceptable.  Hypokalemia: --replete PRN  Cervicalgia Patient is Stephen [name] Exam significant for rigid lump in posterior neck Noted to soft tissue on x-ray C-spine x-ray negative for fracture Plan: --cont lidocaine patch  Morbid obesity BMI 19.6 This complicates care and  prognosis  Hyperglycemia Pre-diabetes --No hx of DM2.  A1c 6.3. --BG within inpatient goal.  No need for fingersticks or SSI.  Thrombocythemia  --plt normal on presentation, now trending up.  Likely reactive. --trend for now  DVT prophylaxis: lovenox Code Status: full  Family Communication: none at bedside Disposition:   Status is: Inpatient  Remains inpatient appropriate because:Inpatient level of care appropriate due to severity of illness   Dispo: The patient is from: Home              Anticipated d/c is to: SNF              Anticipated d/c date is: > 3 days              Patient currently is not medically stable to d/c. Consultants:   ID  vascular  Procedures:  Local debridement 10/30  LE US  IMPRESSION: No evidence of deep venous thrombosis in the left lower extremity. Right common femoral vein also present. Diffuse left lower extremity soft tissue edema noted. Antimicrobials: Anti-infectives (From admission, onward)   Start     Dose/Rate Route Frequency Ordered Stop   06/09/20 0800  ceFAZolin (ANCEF) IVPB 2g/100 mL premix        2 g 200 mL/hr over 30 Minutes Intravenous Every 8 hours 06/09/20 0036     06/07/20 2000  linezolid (ZYVOX) tablet 600 mg  Status:  Discontinued        600 mg Oral Every 12 hours 06/07/20 1431 06/09/20 0036   06/03/20 1800  linezolid (ZYVOX) IVPB 600 mg  Status:  Discontinued        600 mg 300 mL/hr over 60 Minutes Intravenous Every 12 hours 06/03/20 1655 06/07/20 1431   06/01/20 1500  clindamycin (CLEOCIN) IVPB 900 mg  Status:  Discontinued        900 mg 100 mL/hr over 30 Minutes Intravenous Every 8 hours 06/01/20 1334 06/02/20 1302   06/01/20 0400  vancomycin (VANCOCIN) IVPB 1000 mg/200 mL premix  Status:  Discontinued        1,000 mg 200 mL/hr over 60 Minutes Intravenous Every 12 hours 05/31/20 1658 06/03/20 1655   05/31/20 1500  vancomycin (VANCOREADY) IVPB 1500 mg/300 mL        1,500 mg 150 mL/hr over 120 Minutes  Intravenous  Once 05/31/20 1358 05/31/20 1815   05/31/20 1415  cefTRIAXone (ROCEPHIN) 2 g in sodium chloride 0.9 % 100 mL IVPB  Status:  Discontinued        2 g 200 mL/hr over 30 Minutes Intravenous Every 24 hours 05/31/20 1404 06/08/20 1433   05/31/20 1215  vancomycin (VANCOCIN) IVPB 1000 mg/200 mL premix        1,000 mg 200 mL/hr over 60 Minutes Intravenous  Once 05/31/20 1204 05/31/20 1500     Subjective: No new complaints  Objective: Vitals:   06/10/20 2029 06/11/20 0746 06/11/20 1159 06/11/20 1610  BP: 122/75 124/75 (!) 143/98 (!) 134/92  Pulse: 92 87 96 87  Resp:  $Remo'20 19 19  'mpjkp$ Temp:  98.6 F (37 C) 98.3 F (36.8 C) (!) 97.3 F (36.3 C)  TempSrc: Oral Oral Oral Oral  SpO2: 98% 97% 100% 99%  Weight:      Height:        Intake/Output Summary (Last 24 hours) at 06/11/2020 1812 Last data filed at 06/11/2020 1751 Gross per 24 hour  Intake 1340 ml  Output 4100 ml  Net -2760 ml   [employer]  05/31/20 1004  Weight: 136.1 kg    Examination:  General: No acute distress. Cardiovascular: Heart sounds show Mykia Holton regular rate, and rhythm. Lungs: Clear to auscultation bilaterally Abdomen: Soft, nontender, nondistended  Neurological: Alert and oriented 3. Moves all extremities 4. Cranial nerves II through XII grossly intact. Skin: Warm and dry. No rashes or lesions. Extremities: bilateral L>R LEE   Data Reviewed: I have personally reviewed following labs and imaging studies  CBC: Recent Labs  Lab 06/07/20 0424 06/08/20 0425 06/09/20 0644 06/10/20 0505 06/11/20 0619  WBC 17.3* 14.3* 12.6* 12.4* 11.2*  NEUTROABS  --   --   --  8.2*  --   HGB 12.6* 12.3* 12.3* 12.5* 13.1  HCT 37.3* 36.3* 36.8* 37.7* 39.3  MCV 87.1 87.9 87.4 87.3 86.2  PLT 517* 582* 633* 716* 734*    Basic Metabolic Panel: Recent Labs  Lab 06/07/20 0424 06/08/20 0425 06/09/20 0644 06/10/20 0505 06/11/20 0619  NA 132* 130* 132* 130* 129*  K 3.9 3.9 3.9 4.1 3.5  CL 98 97* 97* 98 93*  CO2 $Re'25  24 25 25 25  'Erz$ GLUCOSE 113* 112* 116* 135* 113*  BUN $Re'16 14 16 18 19  'CGv$ CREATININE 1.18 1.17 1.21 1.15 1.47*  CALCIUM 7.7* 8.0* 8.6* 8.4* 8.9  MG 2.2 2.4 2.3 2.4 2.2  PHOS  --   --   --  3.9  --     GFR: Estimated Creatinine Clearance: 80.9 mL/min (Macarthur Lorusso) (by C-G formula based on SCr of 1.47 mg/dL (H)).  Liver Function Tests: Recent Labs  Lab 06/10/20 0505  AST 27  ALT 33  ALKPHOS 61  BILITOT 0.7  PROT 8.4*  ALBUMIN 2.7*    CBG: No results for input(s): GLUCAP in the last 168 hours.   Recent Results (from the past 240 hour(s))  Aerobic Culture (superficial specimen)     Status: None   Collection Time: 06/04/20 10:53 PM   Specimen: Leg; Wound  Result Value Ref Range Status   Specimen Description   Final    LEG LEFT Performed at [facility], [address] [place], [place] [address]    Special Requests   Final    NONE Performed at [facility], [address] [place], [place] [address]    Gram Stain   Final    RARE WBC PRESENT, PREDOMINANTLY PMN NO ORGANISMS SEEN    Culture   Final    NO GROWTH 2 DAYS Performed at [facility], [place] [address], [place], [place] [address]    Report Status 06/07/2020 FINAL  Final         Radiology Studies: ECHOCARDIOGRAM COMPLETE  Result Date: 06/11/2020    ECHOCARDIOGRAM REPORT   Patient Name:   HINES KLOSS Date of Exam: 06/10/2020 Medical Rec #:  [id number]      Height:       71.0 in Accession #:    [id number]     Weight:       300.0 lb Date of Birth:  10-13-1965      BSA:          2.505 m Patient Age:    26 years       BP:           130/75 mmHg Patient Gender: M              HR:           86 bpm. Exam Location:  [facility] Procedure: 2D Echo, Color  Doppler, Cardiac Doppler and Intracardiac            Opacification Agent Indications:     Edema  History:         Patient has no prior history of Echocardiogram examinations.                  Risk Factors:Hypertension.  Sonographer:     Charmayne Sheer RDCS (AE) Referring  Phys:  [phone] Alben Deeds JR Diagnosing Phys: Neoma Laming MD  Sonographer Comments: Technically difficult study due to poor echo windows. IMPRESSIONS  1. Left ventricular ejection fraction, by estimation, is 45 to 50%. The left ventricle has mildly decreased function. The left ventricle has no regional wall motion abnormalities. Left ventricular diastolic parameters are consistent with Grade I diastolic dysfunction (impaired relaxation).  2. Right ventricular systolic function is normal. The right ventricular size is normal.  3. The mitral valve is normal in structure. Trivial mitral valve regurgitation. No evidence of mitral stenosis.  4. The aortic valve is normal in structure. Aortic valve regurgitation is not visualized. No aortic stenosis is present.  5. The inferior vena cava is normal in size with greater than 50% respiratory variability, suggesting right atrial pressure of 3 mmHg. FINDINGS  Left Ventricle: Left ventricular ejection fraction, by estimation, is 45 to 50%. The left ventricle has mildly decreased function. The left ventricle has no regional wall motion abnormalities. Definity contrast agent was given IV to delineate the left ventricular endocardial borders. The left ventricular internal cavity size was normal in size. There is no left ventricular hypertrophy. Left ventricular diastolic parameters are consistent with Grade I diastolic dysfunction (impaired relaxation). Right Ventricle: The right ventricular size is normal. No increase in right ventricular wall thickness. Right ventricular systolic function is normal. Left Atrium: Left atrial size was normal in size. Right Atrium: Right atrial size was normal in size. Pericardium: There is no evidence of pericardial effusion. Mitral Valve: The mitral valve is normal in structure. Trivial mitral valve regurgitation. No evidence of mitral valve stenosis. MV peak gradient, 2.2 mmHg. The mean mitral valve gradient is 1.0 mmHg. Tricuspid Valve:  The tricuspid valve is normal in structure. Tricuspid valve regurgitation is trivial. No evidence of tricuspid stenosis. Aortic Valve: The aortic valve is normal in structure. Aortic valve regurgitation is not visualized. No aortic stenosis is present. Aortic valve mean gradient measures 5.0 mmHg. Aortic valve peak gradient measures 9.2 mmHg. Aortic valve area, by VTI measures 3.51 cm. Pulmonic Valve: The pulmonic valve was normal in structure. Pulmonic valve regurgitation is not visualized. No evidence of pulmonic stenosis. Aorta: The aortic root is normal in size and structure. Venous: The inferior vena cava is normal in size with greater than 50% respiratory variability, suggesting right atrial pressure of 3 mmHg. IAS/Shunts: No atrial level shunt detected by color flow Doppler.  LEFT VENTRICLE PLAX 2D LVIDd:         4.62 cm      Diastology LVIDs:         3.37 cm      LV e' medial:    8.27 cm/s LV PW:         1.13 cm      LV E/e' medial:  6.8 LV IVS:        1.03 cm      LV e' lateral:   9.03 cm/s LVOT diam:     2.40 cm      LV E/e' lateral: 6.2 LV SV:  73 LV SV Index:   29 LVOT Area:     4.52 cm  LV Volumes (MOD) LV vol d, MOD A2C: 106.0 ml LV vol d, MOD A4C: 125.0 ml LV vol s, MOD A2C: 55.9 ml LV vol s, MOD A4C: 63.8 ml LV SV MOD A2C:     50.1 ml LV SV MOD A4C:     125.0 ml LV SV MOD BP:      57.1 ml LEFT ATRIUM             Index LA diam:        2.70 cm 1.08 cm/m LA Vol (A2C):   28.1 ml 11.22 ml/m LA Vol (A4C):   15.6 ml 6.23 ml/m LA Biplane Vol: 21.1 ml 8.42 ml/m  AORTIC VALVE                   PULMONIC VALVE AV Area (Vmax):    3.39 cm    PV Vmax:       1.19 m/s AV Area (Vmean):   3.19 cm    PV Vmean:      79.300 cm/s AV Area (VTI):     3.51 cm    PV VTI:        0.183 m AV Vmax:           152.00 cm/s PV Peak grad:  5.7 mmHg AV Vmean:          99.600 cm/s PV Mean grad:  3.0 mmHg AV VTI:            0.209 m AV Peak Grad:      9.2 mmHg AV Mean Grad:      5.0 mmHg LVOT Vmax:         114.00 cm/s LVOT  Vmean:        70.200 cm/s LVOT VTI:          0.162 m LVOT/AV VTI ratio: 0.78  AORTA Ao Root diam: 3.50 cm MITRAL VALVE MV Area (PHT): 3.48 cm    SHUNTS MV Peak grad:  2.2 mmHg    Systemic VTI:  0.16 m MV Mean grad:  1.0 mmHg    Systemic Diam: 2.40 cm MV Vmax:       0.75 m/s MV Vmean:      56.0 cm/s MV Decel Time: 218 msec MV E velocity: 56.30 cm/s MV Talesha Ellithorpe velocity: 61.10 cm/s MV E/Melika Reder ratio:  0.92 Neoma Laming MD Electronically signed by Neoma Laming MD Signature Date/Time: 06/11/2020/1:10:09 PM    Final         Scheduled Meds: . enoxaparin (LOVENOX) injection  0.5 mg/kg Subcutaneous Q24H  . furosemide  40 mg Intravenous BID  . hydrOXYzine  100 mg Oral QHS  . lidocaine  1 patch Transdermal Q24H  . metolazone  5 mg Oral Daily  . polyethylene glycol  34 g Oral BID  . senna-docusate  1 tablet Oral BID  . traZODone  50 mg Oral QHS   Continuous Infusions: .  ceFAZolin (ANCEF) IV Stopped (06/11/20 1407)     LOS: 11 days    Time spent: over 30 min    Fayrene Helper, MD [facility]   To contact the attending provider between 7A-7P or the covering provider during after hours 7P-7A, please log into the web site [email or web address] and access using universal [place] password for that web site. If you do not have the password, please call the hospital operator.  06/11/2020, 6:12 PM

## 2020-06-11 NOTE — Progress Notes (Signed)
ID Patient doing better Sat in a recliner Says the leg less swollen Less painful  Patient Vitals for the past 24 hrs:  BP Temp Temp src Pulse Resp SpO2  06/11/20 0746 124/75 98.6 F (37 C) Oral 87 20 97 %  06/10/20 2029 122/75 99.1 F (37.3 C) Oral 92 -- 98 %  06/10/20 1542 128/75 98.9 F (37.2 C) Oral 85 16 99 %  06/10/20 1214 128/81 98 F (36.7 C) Oral 89 18 99 %   Awake and alert Chest clear to auscultation Heart sounds S1-S2 Abdomen soft Left leg- swelling less, drier, minimal discharge              Labs CBC Latest Ref Rng & Units 06/11/2020 06/10/2020 06/09/2020  WBC 4.0 - 10.5 K/uL 11.2(H) 12.4(H) 12.6(H)  Hemoglobin 13.0 - 17.0 g/dL 19.5 12.5(L) 12.3(L)  Hematocrit 39 - 52 % 39.3 37.7(L) 36.8(L)  Platelets 150 - 400 K/uL 734(H) 716(H) 633(H)    CMP Latest Ref Rng & Units 06/11/2020 06/10/2020 06/09/2020  Glucose 70 - 99 mg/dL 093(O) 671(I) 458(K)  BUN 6 - 20 mg/dL 19 18 16   Creatinine 0.61 - 1.24 mg/dL ) 9.98(P 3.82  Sodium 135 - 145 mmol/L 129(L) 130(L) 132(L)  Potassium 3.5 - 5.1 mmol/L 3.5 4.1 3.9  Chloride 98 - 111 mmol/L 93(L) 98 97(L)  CO2 22 - 32 mmol/L 25 25 25   Calcium 8.9 - 10.3 mg/dL 8.9 5.05) )  Total Protein 6.5 - 8.1 g/dL - 8.4(H) -  Total Bilirubin 0.3 - 1.2 mg/dL - 0.7 -  Alkaline Phos 38 - 126 U/L - 61 -  AST 15 - 41 U/L - 27 -  ALT 0 - 44 U/L - 33 -      Impression/recommendation Severe cellulitis of the left leg.  Patient has been in hospital since 10/25.  He initially got vancomycin and ceftriaxone and clindamycin and then it was switched to linezolid.  Currently on cefazolin  - Day 11 Both MRI did not reveal any deep infection.  Vascular saw the patient and did superficial debridement of the blisters.Coban dressing done today- will evaluate the wound in 24 hrs    Hypertension on furosemide  Leukocytosis is resolved Patient still has severe thrombocytosis which is worsening But improving clinically- so not sure of  the significance- may be reactive? May  get heme/onc opinion later if it does not improve .

## 2020-06-11 NOTE — TOC Progression Note (Signed)
Transition of Care [facility]) - Progression Note    Patient Details  Name: Stephen [name] MRN: [id number] Date of Birth: Jun 21, 1966  [facility] CM/SW Contact  Trenton Founds, RN Phone Number: 06/11/2020, 2:44 PM  Clinical Narrative:   RNCM still following per bedside nurse wounds are improving. Difficult disposition due to inability to find provider for wound care. Rosey Bath with [facility] could only accept for PT. Wound center can't see patient until sometime in January and at this time his wound care is written for daily. Patient says he is unable to complete on his won. ID continues to follow as well.     Expected Discharge Plan: Home w Home Health Services Barriers to Discharge: Continued Medical Work up  Expected Discharge Plan and Services Expected Discharge Plan: Home w Home Health Services       Living arrangements for the past 2 months: Single Family Home                 DME Arranged: Walker rolling DME Agency: [employer] Date DME Agency Contacted: 06/02/20 Time DME Agency Contacted: [phone] Representative spoke with at DME Agency: [facility] HH Arranged: PT HH Agency: [facility] (formerly [facility]) Date HH Agency Contacted: 06/02/20 Time HH Agency Contacted: 1516 Representative spoke with at [facility] Agency: Rosey Bath   Social Determinants of Health (SDOH) Interventions    Readmission Risk Interventions No flowsheet data found.

## 2020-06-11 NOTE — Progress Notes (Signed)
Physical Therapy Treatment Patient Details Name: Stephen [name] MRN: [id number] DOB: 07-Dec-1965 Today's Date: 06/11/2020    History of Present Illness Pt is a 54 y.o. male presenting to hospital 10/25 with L leg redness, swelling, pain, and weapage.  Pt admitted with sepsis d/t L leg cellulitis, htn, hypokalemia, AKI, and cervicalgia (rigid lump posterior neck).  PMH includes htn.    PT Comments    Pt seated in recliner chair upon arrival to room and agreeable to participate in PT. Pt assisted with donning skid-resitant sock over L foot. Pt noted to stand from recliner chair mod I for use of BUE. Pt ambulated a few steps to RW holding onto footboard of bed for additional steadying however no LOB noted. Once at BRW, pt ambulated 220 feet navigating carts and trash cans in the hallway without difficulty and maintaining steady gait speed throughout with supervision for safety. Noted wide BOS due to body habitus and reciprocal but decreased step lengths bilaterally with slightly decreased stance time on LLE secondary to pain. Pt made great progress with ambulation distance during today's session. Pt returned to room, seated in recliner chair, with LLE propped onto bed for increased elevation. Will continue to follow and progress pt as tolerated.    Follow Up Recommendations  Home health PT     Equipment Recommendations  Rolling walker with 5" wheels;3in1 (PT)    Recommendations for Other Services       Precautions / Restrictions Precautions Precautions: Fall Precaution Comments: LLE wound Restrictions Weight Bearing Restrictions: No    Mobility  Bed Mobility               General bed mobility comments: pt in chair upon arrival so not assessed  Transfers Overall transfer level: Modified independent Equipment used: None Transfers: Sit to/from Stand Sit to Stand: Modified independent (Device/Increase time)         General transfer comment: wide stance secondary to body  habitus; no difficulties noted  Ambulation/Gait Ambulation/Gait assistance: Supervision Gait Distance (Feet): 220 Feet Assistive device: Rolling walker (2 wheeled) Gait Pattern/deviations: Decreased step length - right;Decreased step length - left;Wide base of support Gait velocity: 10' in 8"   General Gait Details: decreased stance time on LLE however reciprocal pattern noted; pt able to navigate obstacles in hallway with no difficulty   Stairs             Wheelchair Mobility    Modified Rankin (Stroke Patients Only)       Balance Overall balance assessment: Needs assistance Sitting-balance support: No upper extremity supported;Feet supported Sitting balance-Leahy Scale: Normal Sitting balance - Comments: no difficulties sitting in recliner chair   Standing balance support: Bilateral upper extremity supported;During functional activity Standing balance-Leahy Scale: Good Standing balance comment: noted good steadiness during ambulation with surv only                            Cognition Arousal/Alertness: Awake/alert Behavior During Therapy: WFL for tasks assessed/performed Overall Cognitive Status: Within Functional Limits for tasks assessed                                        Exercises      General Comments        Pertinent Vitals/Pain Pain Assessment: Faces Faces Pain Scale: Hurts a little bit Pain Location: LLE Pain Descriptors /  Indicators: Aching Pain Intervention(s): Monitored during session;Repositioned    Home Living                      Prior Function            PT Goals (current goals can now be found in the care plan section) Acute Rehab PT Goals Patient Stated Goal: to improve L LE pain and mobility PT Goal Formulation: With patient Time For Goal Achievement: 06/16/20 Potential to Achieve Goals: Good Progress towards PT goals: Progressing toward goals    Frequency    Min 2X/week       PT Plan Current plan remains appropriate    Co-evaluation              AM-PAC PT "6 Clicks" Mobility   Outcome Measure  Help needed turning from your back to your side while in a flat bed without using bedrails?: None Help needed moving from lying on your back to sitting on the side of a flat bed without using bedrails?: None Help needed moving to and from a bed to a chair (including a wheelchair)?: None Help needed standing up from a chair using your arms (e.g., wheelchair or bedside chair)?: None Help needed to walk in hospital room?: None Help needed climbing 3-5 steps with a railing? : A Little 6 Click Score: 23    End of Session Equipment Utilized During Treatment: Gait belt Activity Tolerance: Patient tolerated treatment well Patient left: in chair;with call bell/phone within reach;Other (comment) (LLE elevated on bed) Nurse Communication: Mobility status PT Visit Diagnosis: Other abnormalities of gait and mobility (R26.89);Difficulty in walking, not elsewhere classified (R26.2);Pain Pain - Right/Left: Left Pain - part of body: Leg     Time: 6144-3154 PT Time Calculation (min) (ACUTE ONLY): 13 min  Charges:                        Frederich Chick, SPT   Frederich Chick 06/11/2020, 1:57 PM

## 2020-06-11 NOTE — Unmapped (Signed)
Formatting of this note might be different from the original.  Transition of Care [facility]) - Progression Note     Patient Details   Name: Thomas Browning  MRN: [id number]  Date of Birth: [birth year]    Transition of Care [facility]) CM/SW Contact   Trenton Founds, RN  Phone Number:  06/11/2020, 2:44 PM    Clinical Narrative:   RNCM still following per bedside nurse wounds are improving. Difficult disposition due to inability to find provider for wound care. Rosey Bath with [facility] could only accept for PT. Wound center can't see patient until sometime in January and at this time his wound care is written for daily. Patient says he is unable to complete on his won. ID continues to follow as well.     Expected Discharge Plan: Home w Home Health Services  Barriers to Discharge: Continued Medical Work up    Expected Discharge Plan and Services  Expected Discharge Plan: Home w Home Health Services        Living arrangements for the past 2 months: Single Family Home    DME Arranged: Walker rolling  DME Agency: [employer]  Date DME Agency Contacted: 06/02/20  Time DME Agency Contacted: [phone]  Representative spoke with at DME Agency: Zack  HH Arranged: PT  HH Agency: [facility] (formerly [facility])  Date HH Agency Contacted: 06/02/20  Time HH Agency Contacted: 1516  Representative spoke with at [facility] Agency: Rosey Bath    Social Determinants of Health (SDOH) Interventions      Readmission Risk Interventions  No flowsheet data found.    Electronically signed by Trenton Founds, RN at 06/11/2020  2:46 PM EDT

## 2020-06-11 NOTE — Progress Notes (Signed)
Formatting of this note is different from the original.  Physical Therapy Treatment  Patient Details  Name: Thomas Browning  MRN: [id number]  DOB: 04/24/1966  Today's Date: 06/11/2020    History of Present Illness Pt is a 54 y.o. male presenting to hospital 10/25 with L leg redness, swelling, pain, and weapage.  Pt admitted with sepsis d/t L leg cellulitis, htn, hypokalemia, AKI, and cervicalgia (rigid lump posterior neck).  PMH includes htn.      PT Comments      Pt seated in recliner chair upon arrival to room and agreeable to participate in PT. Pt assisted with donning skid-resitant sock over L foot. Pt noted to stand from recliner chair mod I for use of BUE. Pt ambulated a few steps to RW holding onto footboard of bed for additional steadying however no LOB noted. Once at BRW, pt ambulated 220 feet navigating carts and trash cans in the hallway without difficulty and maintaining steady gait speed throughout with supervision for safety. Noted wide BOS due to body habitus and reciprocal but decreased step lengths bilaterally with slightly decreased stance time on LLE secondary to pain. Pt made great progress with ambulation distance during today's session. Pt returned to room, seated in recliner chair, with LLE propped onto bed for increased elevation. Will continue to follow and progress pt as tolerated.      Follow Up Recommendations   Home health PT      Equipment Recommendations   Rolling walker with 5" wheels;3in1 (PT)     Recommendations for Other Services        Precautions / Restrictions Precautions  Precautions: Fall  Precaution Comments: LLE wound  Restrictions  Weight Bearing Restrictions: No      Mobility   Bed Mobility                General bed mobility comments: pt in chair upon arrival so not assessed    Transfers  Overall transfer level: Modified independent  Equipment used: None  Transfers: Sit to/from Stand  Sit to Stand: Modified independent (Device/Increase time)          General transfer comment:  wide stance secondary to body habitus; no difficulties noted    Ambulation/Gait  Ambulation/Gait assistance: Supervision  Gait Distance (Feet): 220 Feet  Assistive device: Rolling walker (2 wheeled)  Gait Pattern/deviations: Decreased step length - right;Decreased step length - left;Wide base of support  Gait velocity: 10' in 8"    General Gait Details: decreased stance time on LLE however reciprocal pattern noted; pt able to navigate obstacles in hallway with no difficulty    Stairs              Wheelchair Mobility      Modified Rankin (Stroke Patients Only)        Balance Overall balance assessment: Needs assistance  Sitting-balance support: No upper extremity supported;Feet supported  Sitting balance-Leahy Scale: Normal  Sitting balance - Comments: no difficulties sitting in recliner chair    Standing balance support: Bilateral upper extremity supported;During functional activity  Standing balance-Leahy Scale: Good  Standing balance comment: noted good steadiness during ambulation with surv only                              Cognition Arousal/Alertness: Awake/alert  Behavior During Therapy: WFL for tasks assessed/performed  Overall Cognitive Status: Within Functional Limits for tasks assessed  Exercises        General Comments          Pertinent Vitals/Pain Pain Assessment: Faces  Faces Pain Scale: Hurts a little bit  Pain Location: LLE  Pain Descriptors / Indicators: Aching  Pain Intervention(s): Monitored during session;Repositioned     Home Living                      Prior Function             PT Goals (current goals can now be found in the care plan section) Acute Rehab PT Goals  Patient Stated Goal: to improve L LE pain and mobility  PT Goal Formulation: With patient  Time For Goal Achievement: 06/16/20  Potential to Achieve Goals: Good  Progress towards PT goals: Progressing toward goals      Frequency     Min 2X/week      PT Plan Current plan remains appropriate      Co-evaluation                AM-PAC PT "6 Clicks" Mobility    Outcome Measure   Help needed turning from your back to your side while in a flat bed without using bedrails?: None  Help needed moving from lying on your back to sitting on the side of a flat bed without using bedrails?: None  Help needed moving to and from a bed to a chair (including a wheelchair)?: None  Help needed standing up from a chair using your arms (e.g., wheelchair or bedside chair)?: None  Help needed to walk in hospital room?: None  Help needed climbing 3-5 steps with a railing? : A Little  6 Click Score: 23      End of Session Equipment Utilized During Treatment: Gait belt  Activity Tolerance: Patient tolerated treatment well  Patient left: in chair;with call bell/phone within reach;Other (comment) (LLE elevated on bed)  Nurse Communication: Mobility status  PT Visit Diagnosis: Other abnormalities of gait and mobility (R26.89);Difficulty in walking, not elsewhere classified (R26.2);Pain  Pain - Right/Left: Left  Pain - part of body: Leg      Time: 0981-1914  PT Time Calculation (min) (ACUTE ONLY): 13 min    Charges:                  Frederich Chick, SPT    Frederich Chick  06/11/2020, 1:57 PM      Electronically signed by Albertina Parr, PT at 06/11/2020  4:39 PM EDT    Associated attestation - Albertina Parr, PT - 06/11/2020  4:39 PM EDT  Formatting of this note might be different from the original.  This note has been reviewed and this clinician agrees with information provided.    Elizabeth Palau, PT, DPT  [phone]

## 2020-06-11 NOTE — Progress Notes (Signed)
Formatting of this note is different from the original.  Images from the original note were not included.  ID  Patient doing better  Sat in a recliner  Says the leg less swollen  Less painful    Patient Vitals for the past 24 hrs:   BP Temp Temp src Pulse Resp SpO2   06/11/20 0746 124/75 98.6 F (37 C) Oral 87 20 97 %   06/10/20 2029 122/75 99.1 F (37.3 C) Oral 92 -- 98 %   06/10/20 1542 128/75 98.9 F (37.2 C) Oral 85 16 99 %   06/10/20 1214 128/81 98 F (36.7 C) Oral 89 18 99 %     Awake and alert  Chest clear to auscultation  Heart sounds S1-S2  Abdomen soft  Left leg- swelling less, drier, minimal discharge    Labs  CBC Latest Ref Rng & Units 06/11/2020 06/10/2020 06/09/2020   WBC 4.0 - 10.5 K/uL 11.2(H) 12.4(H) 12.6(H)   Hemoglobin 13.0 - 17.0 g/dL 21.3 12.5(L) 12.3(L)   Hematocrit 39 - 52 % 39.3 37.7(L) 36.8(L)   Platelets 150 - 400 K/uL 734(H) 716(H) 633(H)     CMP Latest Ref Rng & Units 06/11/2020 06/10/2020 06/09/2020   Glucose 70 - 99 mg/dL 086(V) 784(O) 962(X)   BUN 6 - 20 mg/dL 19 18 16    Creatinine 0.61 - 1.24 mg/dL 5.28(U) 1.32 4.40   Sodium 135 - 145 mmol/L 129(L) 130(L) 132(L)   Potassium 3.5 - 5.1 mmol/L 3.5 4.1 3.9   Chloride 98 - 111 mmol/L 93(L) 98 97(L)   CO2 22 - 32 mmol/L 25 25 25    Calcium 8.9 - 10.3 mg/dL 8.9 1.0(U) 7.2(Z)   Total Protein 6.5 - 8.1 g/dL - 8.4(H) -   Total Bilirubin 0.3 - 1.2 mg/dL - 0.7 -   Alkaline Phos 38 - 126 U/L - 61 -   AST 15 - 41 U/L - 27 -   ALT 0 - 44 U/L - 33 -     Impression/recommendation  Severe cellulitis of the left leg.  Patient has been in hospital since 10/25.  He initially got vancomycin and ceftriaxone and clindamycin and then it was switched to linezolid.  Currently on cefazolin  - Day 11  Both MRI did not reveal any deep infection.  Vascular saw the patient and did superficial debridement of the blisters.Coban dressing done today- will evaluate the wound in 24 hrs     Hypertension on furosemide    Leukocytosis is resolved  Patient still has severe  thrombocytosis which is worsening  But improving clinically- so not sure of the significance- may be reactive? May  get heme/onc opinion later if it does not improve .  Electronically signed by Lynn Ito, MD at 06/11/2020  4:13 PM EDT

## 2020-06-11 NOTE — Progress Notes (Signed)
Formatting of this note is different from the original.  Images from the original note were not included.    PROGRESS NOTE    Thomas Browning  ZOX:[id number] DOB: Apr 29, 1966 DOA: 05/31/2020  PCP: Patient, No Pcp Per     Chief Complaint   Patient presents with   ? Leg Pain     Brief Narrative:   54 y.o.malewith medical history significant ofHTN,who presents with left leg pain, swelling, weeping.    Patient states that his left leg pain started approximately 5 days ago, which has been progressively worsening. 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  Ultrasound on admission negative.    Patient presented with left leg complaints also complains of constipation neck pain.  Started on broad-spectrum IV antibiotics on admission.    Patient's main complaints are constipation and neck pain.  KUB negative for obstruction.  X-ray C-spine negative for fracture.    Assessment & Plan:    Principal Problem:    Left leg cellulitis  Active Problems:    Hypertension    Sepsis (HCC)    Hypokalemia    AKI (acute kidney injury) (HCC)    Cellulitis of left leg    Severe Sepsis due to left leg cellulitis:  Sepsis ruled in   Left lower extremity venous Doppler isnegative for DVT  MRI 10/29 with diffuse and marked subcutaneous soft tissue swelling/edema/fluid - numerous skin lesions, likely blisters.  Possible developing abscesses.  No myofasciitis or pyomyositis.  No septic arthritis or osteomyelitis.  ESR, CRP elevated, follow   started on vanc (10/25 - 10/27), ceftriaxone (10/25 - 11/2).  Clindamycin 10/26-26.  Linezolid 10/28-11/2.  Ancef 11/3 - present.   --ID consulted, collected fluid from blister for culture, NGTD.  --Vascular surgery performed local debridement at bedside 10/30, signed off  --TOC attempted to make referral  to outpatient wound care center, however, long wait time.  Plan:  --continue cefazolin, appreciate ID recs  --elevation on 6 pillows.  --wound care consult -> recommending ace wraps for minimal compression and leg elevation.  Needs vascular w/u outpatient and clear diagnosis (venous insufficiency vs lymphedema).    --Will follow up with outpatient vascular clinic after discharge    # Severe LLE edema  # possible lymphedema  --LLE edema worsened with development of a layer of edema with multiple blisters, likely due to IVF given since presentation.  --MIVF d/c'ed on 10/27.  Had been receiving IV lasix 40 mg BID.  Held on 10/31 due to Cr increase, resumed on 11/1.  PLAN:  - follow echocardiogram - EF 45-50%, no RWMA, diastolic dysfunction   --hold lasix with bump in creatinine today.  Stop metolazone.  --outpatient followup with vascular for ABI and venous duplex (rule out reflux)  --Outpatient followup with vascular for [place] wrap weekly    AKI (acute kidney injury) (HCC)  Creatinine 1.49, BUN 25, no baseline creatinine available,  may be due to dehydration versus sepsis.  --Cr improved to 1.03 after IVF and tx of cellulitis   --monitor Cr while aggressively diuresing - creatinine bumped today to 1.4, hold lasix    Combined Systolic and diastolic HF:   Will need cards follow up  Diuresis currently on hold    Constipation  Patient states he is chronically constipated and takes multiple laxatives with minimal relief  Likely owing to dietary indiscretions  considering his occupation as a [employer]  No obstruction or ileus noted on imaging  Moderate stool burden  --s/p high-dose miralax  Plan:  --mag citrate   --Miralax 34 g BID    Hypertension  patient not taking medications at home.    BP acceptable.    Hypokalemia:  --replete PRN    Cervicalgia  Patient is a truck driver  Exam significant for rigid lump in posterior neck  Noted to soft tissue on x-ray  C-spine x-ray negative for fracture  Plan:  --cont  lidocaine patch    Morbid obesity  BMI 41.8  This complicates care and prognosis    Hyperglycemia  Pre-diabetes  --No hx of DM2.  A1c 6.3.  --BG within inpatient goal.  No need for fingersticks or SSI.    Thrombocythemia   --plt normal on presentation, now trending up.  Likely reactive.  --trend for now    DVT prophylaxis: lovenox  Code Status: full   Family Communication: none at bedside  Disposition:     Status is: Inpatient    Remains inpatient appropriate because:Inpatient level of care appropriate due to severity of illness    Dispo: The patient is from: Home               Anticipated d/c is to: SNF               Anticipated d/c date is: > 3 days               Patient currently is not medically stable to d/c.  Consultants:    ID   vascular    Procedures:   Local debridement 10/30    LE [place]    IMPRESSION:  No evidence of deep venous thrombosis in the left lower extremity.  Right common femoral vein also present. Diffuse left lower extremity  soft tissue edema noted.  Antimicrobials:  Anti-infectives (From admission, onward)    Start     Dose/Rate Route Frequency Ordered Stop    06/09/20 0800  ceFAZolin (ANCEF) IVPB 2g/100 mL premix         2 g  200 mL/hr over 30 Minutes Intravenous Every 8 hours 06/09/20 0036      06/07/20 2000  linezolid (ZYVOX) tablet 600 mg  Status:  Discontinued         600 mg Oral Every 12 hours 06/07/20 1431 06/09/20 0036    06/03/20 1800  linezolid (ZYVOX) IVPB 600 mg  Status:  Discontinued         600 mg  300 mL/hr over 60 Minutes Intravenous Every 12 hours 06/03/20 1655 06/07/20 1431    06/01/20 1500  clindamycin (CLEOCIN) IVPB 900 mg  Status:  Discontinued         900 mg  100 mL/hr over 30 Minutes Intravenous Every 8 hours 06/01/20 1334 06/02/20 1302    06/01/20 0400  vancomycin (VANCOCIN) IVPB 1000 mg/200 mL premix  Status:  Discontinued         1,000 mg  200 mL/hr over 60 Minutes Intravenous Every 12 hours 05/31/20 1658 06/03/20 1655    05/31/20 1500  vancomycin (VANCOREADY)  IVPB 1500 mg/300 mL         1,500 mg  150 mL/hr over 120 Minutes Intravenous  Once 05/31/20 1358 05/31/20 1815    05/31/20 1415  cefTRIAXone (ROCEPHIN) 2 g in sodium chloride 0.9 % 100 mL IVPB  Status:  Discontinued         2 g  200 mL/hr over 30 Minutes Intravenous Every 24 hours 05/31/20 1404 06/08/20 1433    05/31/20 1215  vancomycin (VANCOCIN) IVPB 1000 mg/200 mL premix         1,000 mg  200 mL/hr over 60 Minutes Intravenous  Once 05/31/20 1204 05/31/20 1500       Subjective:  No new complaints    Objective:  Vitals:    06/10/20 2029 06/11/20 0746 06/11/20 1159 06/11/20 1610   BP: 122/75 124/75 (!) 143/98 (!) 134/92   Pulse: 92 87 96 87   Resp:  20 19 19    Temp:  98.6 F (37 C) 98.3 F (36.8 C) (!) 97.3 F (36.3 C)   TempSrc: Oral Oral Oral Oral   SpO2: 98% 97% 100% 99%   Weight:       Height:         Intake/Output Summary (Last 24 hours) at 06/11/2020 1812  Last data filed at 06/11/2020 1751  Gross per 24 hour   Intake 1340 ml   Output 4100 ml   Net -2760 ml     Filed Weights    05/31/20 1004   Weight: 136.1 kg     Examination:    General: No acute distress.  Cardiovascular: Heart sounds show a regular rate, and rhythm.  Lungs: Clear to auscultation bilaterally  Abdomen: Soft, nontender, nondistended   Neurological: Alert and oriented 3. Moves all extremities 4. Cranial nerves II through XII grossly intact.  Skin: Warm and dry. No rashes or lesions.  Extremities: bilateral L>R LEE    Data Reviewed: I have personally reviewed following labs and imaging studies    CBC:  Recent Labs   Lab 06/07/20  0424 06/08/20  0425 06/09/20  0644 06/10/20  0505 06/11/20  0619   WBC 17.3* 14.3* 12.6* 12.4* 11.2*   NEUTROABS  --   --   --  8.2*  --    HGB 12.6* 12.3* 12.3* 12.5* 13.1   HCT 37.3* 36.3* 36.8* 37.7* 39.3   MCV 87.1 87.9 87.4 87.3 86.2   PLT 517* 582* 633* 716* 734*     Basic Metabolic Panel:  Recent Labs   Lab 06/07/20  0424 06/08/20  0425 06/09/20  0644 06/10/20  0505 06/11/20  0619   NA 132* 130* 132* 130* 129*    K 3.9 3.9 3.9 4.1 3.5   CL 98 97* 97* 98 93*   CO2 25 24 25 25 25    GLUCOSE 113* 112* 116* 135* 113*   BUN 16 14 16 18 19    CREATININE 1.18 1.17 1.21 1.15 1.47*   CALCIUM 7.7* 8.0* 8.6* 8.4* 8.9   MG 2.2 2.4 2.3 2.4 2.2   PHOS  --   --   --  3.9  --      GFR:  Estimated Creatinine Clearance: 80.9 mL/min (A) (by C-G formula based on SCr of 1.47 mg/dL (H)).    Liver Function Tests:  Recent Labs   Lab 06/10/20  0505   AST 27   ALT 33   ALKPHOS 61   BILITOT 0.7   PROT 8.4*   ALBUMIN 2.7*     CBG:  No results for input(s): GLUCAP in the last 168 hours.    Recent Results (from the past 240 hour(s))   Aerobic Culture (superficial specimen)     Status: None    Collection Time: 06/04/20 10:53 PM    Specimen: Leg; Wound   Result Value Ref Range Status  Specimen Description   Final     LEG LEFT  Performed at [facility], [address] [place], [place] [address]     Special Requests   Final     NONE  Performed at [facility], [address] [place], [place] [address]     Gram Stain   Final     RARE WBC PRESENT, PREDOMINANTLY PMN  NO ORGANISMS SEEN     Culture   Final     NO GROWTH 2 DAYS  Performed at [facility], [address], [place], [place] [address]     Report Status 06/07/2020 FINAL  Final       Radiology Studies:  ECHOCARDIOGRAM COMPLETE    Result Date: 06/11/2020     ECHOCARDIOGRAM REPORT   Patient Name:   Thomas Browning Date of Exam: 06/10/2020 Medical Rec #:  [id number]      Height:       71.0 in Accession #:    [id number]     Weight:       300.0 lb Date of Birth:  11-Dec-1965      BSA:          2.505 m Patient Age:    54 years       BP:           130/75 mmHg Patient Gender: M              HR:           86 bpm. Exam Location:  [facility] Procedure: 2D Echo, Color Doppler, Cardiac Doppler and Intracardiac            Opacification Agent Indications:     Edema  History:         Patient has no prior history of Echocardiogram examinations.                  Risk Factors:Hypertension.   Sonographer:     Humphrey Rolls RDCS (AE) Referring Phys:  [phone] Skeet Simmer JR Diagnosing Phys: Adrian Blackwater MD  Sonographer Comments: Technically difficult study due to poor echo windows. IMPRESSIONS  1. Left ventricular ejection fraction, by estimation, is 45 to 50%. The left ventricle has mildly decreased function. The left ventricle has no regional wall motion abnormalities. Left ventricular diastolic parameters are consistent with Grade I diastolic dysfunction (impaired relaxation).  2. Right ventricular systolic function is normal. The right ventricular size is normal.  3. The mitral valve is normal in structure. Trivial mitral valve regurgitation. No evidence of mitral stenosis.  4. The aortic valve is normal in structure. Aortic valve regurgitation is not visualized. No aortic stenosis is present.  5. The inferior vena cava is normal in size with greater than 50% respiratory variability, suggesting right atrial pressure of 3 mmHg. FINDINGS  Left Ventricle: Left ventricular ejection fraction, by estimation, is 45 to 50%. The left ventricle has mildly decreased function. The left ventricle has no regional wall motion abnormalities. Definity contrast agent was given IV to delineate the left ventricular endocardial borders. The left ventricular internal cavity size was normal in size. There is no left ventricular hypertrophy. Left ventricular diastolic parameters are consistent with Grade I diastolic dysfunction (impaired relaxation). Right Ventricle: The right ventricular size is normal. No increase in right ventricular wall thickness. Right ventricular systolic function is normal. Left Atrium: Left atrial size was normal in size. Right Atrium: Right atrial size was normal in size. Pericardium: There is no  evidence of pericardial effusion. Mitral Valve: The mitral valve is normal in structure. Trivial mitral valve regurgitation. No evidence of mitral valve stenosis. MV peak gradient, 2.2 mmHg. The mean  mitral valve gradient is 1.0 mmHg. Tricuspid Valve: The tricuspid valve is normal in structure. Tricuspid valve regurgitation is trivial. No evidence of tricuspid stenosis. Aortic Valve: The aortic valve is normal in structure. Aortic valve regurgitation is not visualized. No aortic stenosis is present. Aortic valve mean gradient measures 5.0 mmHg. Aortic valve peak gradient measures 9.2 mmHg. Aortic valve area, by VTI measures 3.51 cm. Pulmonic Valve: The pulmonic valve was normal in structure. Pulmonic valve regurgitation is not visualized. No evidence of pulmonic stenosis. Aorta: The aortic root is normal in size and structure. Venous: The inferior vena cava is normal in size with greater than 50% respiratory variability, suggesting right atrial pressure of 3 mmHg. IAS/Shunts: No atrial level shunt detected by color flow Doppler.  LEFT VENTRICLE PLAX 2D LVIDd:         4.62 cm      Diastology LVIDs:         3.37 cm      LV e' medial:    8.27 cm/s LV PW:         1.13 cm      LV E/e' medial:  6.8 LV IVS:        1.03 cm      LV e' lateral:   9.03 cm/s LVOT diam:     2.40 cm      LV E/e' lateral: 6.2 LV SV:         73 LV SV Index:   29 LVOT Area:     4.52 cm  LV Volumes (MOD) LV vol d, MOD A2C: 106.0 ml LV vol d, MOD A4C: 125.0 ml LV vol s, MOD A2C: 55.9 ml LV vol s, MOD A4C: 63.8 ml LV SV MOD A2C:     50.1 ml LV SV MOD A4C:     125.0 ml LV SV MOD BP:      57.1 ml LEFT ATRIUM             Index LA diam:        2.70 cm 1.08 cm/m LA Vol (A2C):   28.1 ml 11.22 ml/m LA Vol (A4C):   15.6 ml 6.23 ml/m LA Biplane Vol: 21.1 ml 8.42 ml/m  AORTIC VALVE                   PULMONIC VALVE AV Area (Vmax):    3.39 cm    PV Vmax:       1.19 m/s AV Area (Vmean):   3.19 cm    PV Vmean:      79.300 cm/s AV Area (VTI):     3.51 cm    PV VTI:        0.183 m AV Vmax:           152.00 cm/s PV Peak grad:  5.7 mmHg AV Vmean:          99.600 cm/s PV Mean grad:  3.0 mmHg AV VTI:            0.209 m AV Peak Grad:      9.2 mmHg AV Mean  Grad:      5.0 mmHg LVOT Vmax:         114.00 cm/s LVOT Vmean:        70.200 cm/s LVOT VTI:  0.162 m LVOT/AV VTI ratio: 0.78  AORTA Ao Root diam: 3.50 cm MITRAL VALVE MV Area (PHT): 3.48 cm    SHUNTS MV Peak grad:  2.2 mmHg    Systemic VTI:  0.16 m MV Mean grad:  1.0 mmHg    Systemic Diam: 2.40 cm MV Vmax:       0.75 m/s MV Vmean:      56.0 cm/s MV Decel Time: 218 msec MV E velocity: 56.30 cm/s MV A velocity: 61.10 cm/s MV E/A ratio:  0.92 Adrian Blackwater MD Electronically signed by Adrian Blackwater MD Signature Date/Time: 06/11/2020/1:10:09 PM    Final      Scheduled Meds:  ? enoxaparin (LOVENOX) injection  0.5 mg/kg Subcutaneous Q24H   ? furosemide  40 mg Intravenous BID   ? hydrOXYzine  100 mg Oral QHS   ? lidocaine  1 patch Transdermal Q24H   ? metolazone  5 mg Oral Daily   ? polyethylene glycol  34 g Oral BID   ? senna-docusate  1 tablet Oral BID   ? traZODone  50 mg Oral QHS     Continuous Infusions:  ?  ceFAZolin (ANCEF) IV Stopped (06/11/20 1407)      LOS: 11 days     Time spent: over 30 min    Lacretia Nicks, MD  [facility]    To contact the attending provider between 7A-7P or the covering provider during after hours 7P-7A, please log into the web site [email or web address] and access using universal [facility] password for that web site. If you do not have the password, please call the hospital operator.    06/11/2020, 6:12 PM     Electronically signed by Zigmund Daniel., MD at 06/11/2020  6:24 PM EDT

## 2020-06-12 LAB — MAGNESIUM: Magnesium: 2.2 mg/dL (ref 1.7–2.4)

## 2020-06-12 LAB — COMPREHENSIVE METABOLIC PANEL
ALT: 27 U/L (ref 0–44)
AST: 25 U/L (ref 15–41)
Albumin: 3.1 g/dL — ABNORMAL LOW (ref 3.5–5.0)
Alkaline Phosphatase: 67 U/L (ref 38–126)
Anion gap: 13 (ref 5–15)
BUN: 22 mg/dL — ABNORMAL HIGH (ref 6–20)
CO2: 25 mmol/L (ref 22–32)
Calcium: 9.1 mg/dL (ref 8.9–10.3)
Chloride: 89 mmol/L — ABNORMAL LOW (ref 98–111)
Creatinine, Ser: 1.62 mg/dL — ABNORMAL HIGH (ref 0.61–1.24)
GFR, Estimated: 50 mL/min — ABNORMAL LOW (ref >60)
Glucose, Bld: 119 mg/dL — ABNORMAL HIGH (ref 70–99)
Potassium: 3.2 mmol/L — ABNORMAL LOW (ref 3.5–5.1)
Sodium: 127 mmol/L — ABNORMAL LOW (ref 135–145)
Total Bilirubin: 0.9 mg/dL (ref 0.3–1.2)
Total Protein: 9 g/dL — ABNORMAL HIGH (ref 6.5–8.1)

## 2020-06-12 LAB — CBC WITH DIFFERENTIAL/PLATELET
Abs Immature Granulocytes: 0.12 10*3/uL — ABNORMAL HIGH (ref 0.00–0.07)
Basophils Absolute: 0.1 10*3/uL (ref 0.0–0.1)
Basophils Relative: 1 %
Eosinophils Absolute: 0.1 10*3/uL (ref 0.0–0.5)
Eosinophils Relative: 1 %
HCT: 38.4 % — ABNORMAL LOW (ref 39.0–52.0)
Hemoglobin: 12.9 g/dL — ABNORMAL LOW (ref 13.0–17.0)
Immature Granulocytes: 1 %
Lymphocytes Relative: 26 %
Lymphs Abs: 3 10*3/uL (ref 0.7–4.0)
MCH: 29.1 pg (ref 26.0–34.0)
MCHC: 33.6 g/dL (ref 30.0–36.0)
MCV: 86.5 fL (ref 80.0–100.0)
Monocytes Absolute: 1.2 10*3/uL — ABNORMAL HIGH (ref 0.1–1.0)
Monocytes Relative: 10 %
Neutro Abs: 7.3 10*3/uL (ref 1.7–7.7)
Neutrophils Relative %: 61 %
Platelets: 804 10*3/uL — ABNORMAL HIGH (ref 150–400)
RBC: 4.44 MIL/uL (ref 4.22–5.81)
RDW: 13.4 % (ref 11.5–15.5)
WBC: 11.9 10*3/uL — ABNORMAL HIGH (ref 4.0–10.5)
nRBC: 0 % (ref 0.0–0.2)

## 2020-06-12 LAB — SEDIMENTATION RATE: Sed Rate: 55 mm/hr — ABNORMAL HIGH (ref 0–20)

## 2020-06-12 LAB — PHOSPHORUS: Phosphorus: 4.3 mg/dL (ref 2.5–4.6)

## 2020-06-12 LAB — C-REACTIVE PROTEIN: CRP: 7.8 mg/dL — ABNORMAL HIGH (ref <1.0)

## 2020-06-12 MED ORDER — POTASSIUM CHLORIDE CRYS ER 20 MEQ PO TBCR
40.0000 meq | EXTENDED_RELEASE_TABLET | ORAL | Status: AC
  Administered 2020-06-12 (×2): 40 meq via ORAL
  Filled 2020-06-12 (×2): qty 2

## 2020-06-12 MED ORDER — SODIUM CHLORIDE 0.9 % IV SOLN: INTRAVENOUS | Status: AC

## 2020-06-12 MED ORDER — CARVEDILOL 3.125 MG PO TABS
3.1250 mg | ORAL_TABLET | Freq: Two times a day (BID) | ORAL | Status: DC
  Administered 2020-06-12 – 2020-06-15 (×6): 3.125 mg via ORAL
  Filled 2020-06-12 (×6): qty 1

## 2020-06-12 NOTE — Progress Notes (Signed)
PROGRESS NOTE    Stephen [name]  YCX:[id number] DOB: 09-02-1965 DOA: 05/31/2020 PCP: Patient, No Pcp Per   Chief Complaint  Patient presents with  . Leg Pain    Brief Narrative:  54 y.o.malewith medical history significant ofHTN,who presents with left leg pain, swelling, weeping.  Patient states that his left leg pain started approximately 5 days ago, which has been progressively worsening. 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  Ultrasound on admission negative.  Patient presented with left leg complaints also complains of constipation neck pain.  Started on broad-spectrum IV antibiotics on admission.  Patient's main complaints are constipation and neck pain.  KUB negative for obstruction.  X-ray C-spine negative for fracture.  Assessment & Plan:   Principal Problem:   Left leg cellulitis Active Problems:   Hypertension   Sepsis ([place])   Hypokalemia   AKI (acute kidney injury) ([place])   Cellulitis of left leg  Severe Sepsis due to left leg cellulitis: Sepsis ruled in  Left lower extremity venous Doppler isnegative for DVT MRI 10/29 with diffuse and marked subcutaneous soft tissue swelling/edema/fluid - numerous skin lesions, likely blisters.  Possible developing abscesses.  No myofasciitis or pyomyositis.  No septic arthritis or osteomyelitis. ESR, CRP elevated, follow  started on vanc (10/25 - 10/27), ceftriaxone (10/25 - 11/2).  Clindamycin 10/26-26.  Linezolid 10/28-11/2.  Ancef 11/3 - present.  --ID consulted, collected fluid from blister for culture, NGTD. --Vascular surgery performed local debridement at bedside 10/30, signed off --TOC attempted to make referral to outpatient wound care center, however, long wait time. Plan: --continue cefazolin.  RLE appears  gradually improved, continue abx per ID. --elevation on 6 pillows. --wound care consult -> recommending ace wraps for minimal compression and leg elevation.  Needs vascular w/u outpatient and clear diagnosis (venous insufficiency vs lymphedema).   --Will follow up with outpatient vascular clinic after discharge  # Severe LLE edema # possible lymphedema --LLE edema worsened with development of Adalbert Alberto layer of edema with multiple blisters, likely due to IVF given since presentation. --MIVF d/c'ed on 10/27.  Had been receiving IV lasix 40 mg BID.  Held on 10/31 due to Cr increase, resumed on 11/1. PLAN: - follow echocardiogram - EF 45-50%, no RWMA, diastolic dysfunction  --hold lasix with bump in creatinine.  Stop metolazone. --outpatient followup with vascular for ABI and venous duplex (rule out reflux) --Outpatient followup with vascular for [place] wrap weekly  AKI (acute kidney injury) ([place]) Creatinine 1.49, BUN 25, no baseline creatinine available, may be due to dehydration versus sepsis. 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  A1c 6.3. --BG within inpatient goal.  No need for fingersticks or SSI.  Thrombocythemia  --plt normal on presentation, now trending up.  Likely reactive. --trend for now  DVT prophylaxis: lovenox Code Status: full  Family Communication: none at bedside Disposition:   Status is: Inpatient  Remains inpatient appropriate because:Inpatient level of care appropriate due to severity of illness   Dispo: The patient is from: Home              Anticipated d/c is to: SNF              Anticipated d/c date is: > 3 days              Patient currently is not medically stable to d/c. Consultants:   ID  vascular  Procedures:  Local debridement 10/30  LE [place]  IMPRESSION: No evidence of deep venous thrombosis in the left lower extremity. Right common femoral vein also present. Diffuse left lower extremity soft tissue edema noted. Antimicrobials: Anti-infectives (From admission, onward)   Start     Dose/Rate Route Frequency Ordered Stop   06/09/20 0800  ceFAZolin (ANCEF) IVPB 2g/100 mL premix        2 g 200 mL/hr over 30 Minutes Intravenous Every 8 hours 06/09/20 0036     06/07/20 2000  linezolid (ZYVOX) tablet 600 mg  Status:  Discontinued        600 mg Oral Every 12 hours 06/07/20 1431 06/09/20 0036   06/03/20 1800  linezolid (ZYVOX) IVPB 600 mg  Status:  Discontinued        600 mg 300 mL/hr over 60 Minutes Intravenous Every 12 hours 06/03/20 1655 06/07/20 1431   06/01/20 1500  clindamycin (CLEOCIN) IVPB 900 mg  Status:  Discontinued        900 mg 100 mL/hr over 30 Minutes Intravenous Every 8 hours 06/01/20 1334 06/02/20 1302   06/01/20 0400  vancomycin (VANCOCIN) IVPB 1000 mg/200 mL premix  Status:  Discontinued        1,000 mg 200 mL/hr over 60 Minutes Intravenous Every 12 hours 05/31/20 1658 06/03/20 1655     05/31/20 1500  vancomycin (VANCOREADY) IVPB 1500 mg/300 mL        1,500 mg 150 mL/hr over 120 Minutes Intravenous  Once 05/31/20 1358 05/31/20 1815   05/31/20 1415  cefTRIAXone (ROCEPHIN) 2 g in sodium chloride 0.9 % 100 mL IVPB  Status:  Discontinued        2 g 200 mL/hr over 30 Minutes Intravenous Every 24 hours 05/31/20 1404 06/08/20 1433   05/31/20 1215  vancomycin (VANCOCIN) IVPB 1000 mg/200 mL premix        1,000 mg 200 mL/hr over 60 Minutes Intravenous  Once 05/31/20 1204 05/31/20 1500     Subjective: Feels leg is better  Objective: Vitals:   06/12/20 0438 06/12/20 0809 06/12/20 1201 06/12/20 1533  BP: 123/76 132/70 133/67 134/80  Pulse: 88 83 84 84  Resp: _0 Temp: 97.9 F (36.6 C) 98.8 F (37.1 C) 98.3 F (36.8 C) 98.5 F (36.9 C)  TempSrc:  Oral Oral   SpO2: 97% 95% 97% 95%  Weight:      Height:        Intake/Output Summary (Last 24 hours) at 06/12/2020 1626 Last  data filed at 06/12/2020 0100 Gross per 24 hour  Intake 838.96 ml  Output 1650 ml  Net -811.04 ml   Filed Weights   05/31/20 1004  Weight: 136.1 kg    Examination:  General: No acute distress. Cardiovascular: Heart sounds show Stephen [name] regular rate, and rhythm.  Lungs: Clear to auscultation bilaterally  Abdomen: Soft, nontender, nondistended  Neurological: Alert and oriented 3. Moves all extremities 4 . Cranial nerves II through XII grossly intact. Skin: Warm and dry. No rashes or lesions. Extremities: RLE with swelling, several open wounds, erythema appears to continue to improve    Data Reviewed: I have personally reviewed following labs and imaging studies  CBC: Recent Labs  Lab 06/08/20 0425 06/09/20 0644 06/10/20 0505 06/11/20 0619 06/12/20 0421  WBC 14.3* 12.6* 12.4* 11.2* 11.9*  NEUTROABS  --   --  8.2*  --  7.3  HGB 12.3* 12.3* 12.5* 13.1 12.9*  HCT 36.3* 36.8* 37.7* 39.3 38.4*  MCV 87.9 87.4 87.3 86.2 86.5  PLT 582* 633* 716* 734* 804*    Basic Metabolic  Panel: Recent Labs  Lab 06/08/20 0425 06/09/20 0644 06/10/20 0505 06/11/20 0619 06/12/20 0421  NA 130* 132* 130* 129* 127*  K 3.9 3.9 4.1 3.5 3.2*  CL 97* 97* 98 93* 89*  CO2 _0 GLUCOSE 112* 116* 135* 113* 119*  BUN _1 22*  CREATININE 1.17 1.21 1.15 1.47* 1.62*  CALCIUM 8.0* 8.6* 8.4* 8.9 9.1  MG 2.4 2.3 2.4 2.2 2.2  PHOS  --   --  3.9  --  4.3    GFR: Estimated Creatinine Clearance: 73.4 mL/min (Stephen [name]) (by C-G formula based on SCr of 1.62 mg/dL (H)).  Liver Function Tests: Recent Labs  Lab 06/10/20 0505 06/12/20 0421  AST 27 25  ALT 33 27  ALKPHOS 61 67  BILITOT 0.7 0.9  PROT 8.4* 9.0*  ALBUMIN 2.7* 3.1*    CBG: No results for input(s): GLUCAP in the last 168 hours.   Recent Results (from the past 240 hour(s))  Aerobic Culture (superficial specimen)     Status: None   Collection Time: 06/04/20 10:53 PM   Specimen: Leg; Wound  Result Value Ref Range Status   Specimen Description   Final    LEG LEFT Performed at [facility], [address], [place], [place] [address]    Special Requests   Final    NONE Performed at [facility], [address] [place], [place] [address]    Gram Stain   Final    RARE WBC PRESENT, PREDOMINANTLY PMN NO ORGANISMS SEEN    Culture   Final    NO GROWTH 2 DAYS Performed at [facility], [place] [address], [place], [place] [address]    Report Status 06/07/2020 FINAL  Final         Radiology Studies: No results found.      Scheduled Meds: . enoxaparin (LOVENOX) injection  0.5 mg/kg Subcutaneous Q24H  . hydrOXYzine  100 mg Oral QHS  . lidocaine  1 patch Transdermal Q24H  . polyethylene glycol  34 g Oral BID  . senna-docusate  1 tablet Oral BID  . traZODone  50 mg Oral QHS   Continuous Infusions: . sodium chloride 75 mL/hr at 06/12/20 0821  .  ceFAZolin (ANCEF) IV 2 g (06/12/20 1330)     LOS: 12 days    Time spent: over 30 min    Fayrene Helper, MD  [facility]   To contact the attending provider between 7A-7P or the covering provider during after hours 7P-7A, please log into the web site [email or web address] and access using universal [place] password for that web site. If you do not have the password, please call the hospital operator.  06/12/2020, 4:26 PM

## 2020-06-12 NOTE — Progress Notes (Signed)
Formatting of this note is different from the original.  Images from the original note were not included.    PROGRESS NOTE    Thomas Browning  NGE:[id number] DOB: 01/18/1966 DOA: 05/31/2020  PCP: Patient, No Pcp Per     Chief Complaint   Patient presents with   ? Leg Pain     Brief Narrative:   54 y.o.malewith medical history significant ofHTN,who presents with left leg pain, swelling, weeping.    Patient states that his left leg pain started approximately 5 days ago, which has been progressively worsening. 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  Ultrasound on admission negative.    Patient presented with left leg complaints also complains of constipation neck pain.  Started on broad-spectrum IV antibiotics on admission.    Patient's main complaints are constipation and neck pain.  KUB negative for obstruction.  X-ray C-spine negative for fracture.    Assessment & Plan:    Principal Problem:    Left leg cellulitis  Active Problems:    Hypertension    Sepsis ([facility])    Hypokalemia    AKI (acute kidney injury) ([facility])    Cellulitis of left leg    Severe Sepsis due to left leg cellulitis:  Sepsis ruled in   Left lower extremity venous Doppler isnegative for DVT  MRI 10/29 with diffuse and marked subcutaneous soft tissue swelling/edema/fluid - numerous skin lesions, likely blisters.  Possible developing abscesses.  No myofasciitis or pyomyositis.  No septic arthritis or osteomyelitis.  ESR, CRP elevated, follow   started on vanc (10/25 - 10/27), ceftriaxone (10/25 - 11/2).  Clindamycin 10/26-26.  Linezolid 10/28-11/2.  Ancef 11/3 - present.   --ID consulted, collected fluid from blister for culture, NGTD.  --Vascular surgery performed local debridement at bedside 10/30, signed off  --TOC attempted to make referral  to outpatient wound care center, however, long wait time.  Plan:  --continue cefazolin.  RLE appears gradually improved, continue abx per ID.  --elevation on 6 pillows.  --wound care consult -> recommending ace wraps for minimal compression and leg elevation.  Needs vascular w/u outpatient and clear diagnosis (venous insufficiency vs lymphedema).    --Will follow up with outpatient vascular clinic after discharge    # Severe LLE edema  # possible lymphedema  --LLE edema worsened with development of a layer of edema with multiple blisters, likely due to IVF given since presentation.  --MIVF d/c'ed on 10/27.  Had been receiving IV lasix 40 mg BID.  Held on 10/31 due to Cr increase, resumed on 11/1.  PLAN:  - follow echocardiogram - EF 45-50%, no RWMA, diastolic dysfunction   --hold lasix with bump in creatinine.  Stop metolazone.  --outpatient followup with vascular for ABI and venous duplex (rule out reflux)  --Outpatient followup with vascular for [place] wrap weekly    AKI (acute kidney injury) ([facility])  Creatinine 1.49, BUN 25, no baseline creatinine available,  may be due to dehydration versus sepsis.  --Cr improved to 1.03 after IVF and tx of cellulitis   --monitor Cr while aggressively diuresing - creatinine bumped today to 1.6 today, hold lasix, stop metolazone  - gentle IVF and follow    Combined Systolic and diastolic HF:   Will need cards follow up  Diuresis currently on hold  Arb when able, will start coreg  Constipation  Patient states he is chronically constipated and takes multiple laxatives with minimal relief  Likely owing to dietary indiscretions considering his occupation as a [employer]  No obstruction or ileus noted on imaging  Moderate stool burden  --s/p high-dose miralax  Plan:  --mag citrate   --Miralax 34 g BID    Hypertension  Will need BP meds for decreased EF  Coreg, follow creatinine, start losartan when able    Hypokalemia:  --replace and follow    Cervicalgia  Patient is a truck  driver  Exam significant for rigid lump in posterior neck  Noted to soft tissue on x-ray  C-spine x-ray negative for fracture  Plan:  --cont lidocaine patch    Morbid obesity  BMI 41.8  This complicates care and prognosis    Hyperglycemia  Pre-diabetes  --No hx of DM2.  A1c 6.3.  --BG within inpatient goal.  No need for fingersticks or SSI.    Thrombocythemia   --plt normal on presentation, now trending up.  Likely reactive.  --trend for now    DVT prophylaxis: lovenox  Code Status: full   Family Communication: none at bedside  Disposition:     Status is: Inpatient    Remains inpatient appropriate because:Inpatient level of care appropriate due to severity of illness    Dispo: The patient is from: Home               Anticipated d/c is to: SNF               Anticipated d/c date is: > 3 days               Patient currently is not medically stable to d/c.  Consultants:    ID   vascular    Procedures:   Local debridement 10/30    LE [place]    IMPRESSION:  No evidence of deep venous thrombosis in the left lower extremity.  Right common femoral vein also present. Diffuse left lower extremity  soft tissue edema noted.  Antimicrobials:  Anti-infectives (From admission, onward)    Start     Dose/Rate Browning Frequency Ordered Stop    06/09/20 0800  ceFAZolin (ANCEF) IVPB 2g/100 mL premix         2 g  200 mL/hr over 30 Minutes Intravenous Every 8 hours 06/09/20 0036      06/07/20 2000  linezolid (ZYVOX) tablet 600 mg  Status:  Discontinued         600 mg Oral Every 12 hours 06/07/20 1431 06/09/20 0036    06/03/20 1800  linezolid (ZYVOX) IVPB 600 mg  Status:  Discontinued         600 mg  300 mL/hr over 60 Minutes Intravenous Every 12 hours 06/03/20 1655 06/07/20 1431    06/01/20 1500  clindamycin (CLEOCIN) IVPB 900 mg  Status:  Discontinued         900 mg  100 mL/hr over 30 Minutes Intravenous Every 8 hours 06/01/20 1334 06/02/20 1302    06/01/20 0400  vancomycin (VANCOCIN) IVPB 1000 mg/200 mL premix  Status:  Discontinued          1,000 mg  200 mL/hr over 60 Minutes Intravenous Every 12 hours 05/31/20 1658 06/03/20 1655    05/31/20 1500  vancomycin (VANCOREADY) IVPB 1500 mg/300 mL         1,500 mg  150 mL/hr over 120 Minutes Intravenous  Once 05/31/20 1358 05/31/20 1815    05/31/20 1415  cefTRIAXone (ROCEPHIN) 2 g in sodium chloride 0.9 % 100 mL IVPB  Status:  Discontinued         2 g  200 mL/hr over 30 Minutes Intravenous Every 24 hours 05/31/20 1404 06/08/20 1433    05/31/20 1215  vancomycin (VANCOCIN) IVPB 1000 mg/200 mL premix         1,000 mg  200 mL/hr over 60 Minutes Intravenous  Once 05/31/20 1204 05/31/20 1500       Subjective:  Feels leg is better    Objective:  Vitals:    06/12/20 0438 06/12/20 0809 06/12/20 1201 06/12/20 1533   BP: 123/76 132/70 133/67 134/80   Pulse: 88 83 84 84   Resp: 18 15 18     Temp: 97.9 F (36.6 C) 98.8 F (37.1 C) 98.3 F (36.8 C) 98.5 F (36.9 C)   TempSrc:  Oral Oral    SpO2: 97% 95% 97% 95%   Weight:       Height:         Intake/Output Summary (Last 24 hours) at 06/12/2020 1626  Last data filed at 06/12/2020 0100  Gross per 24 hour   Intake 838.96 ml   Output 1650 ml   Net -811.04 ml     Filed Weights    05/31/20 1004   Weight: 136.1 kg     Examination:    General: No acute distress.  Cardiovascular: Heart sounds show a regular rate, and rhythm.   Lungs: Clear to auscultation bilaterally   Abdomen: Soft, nontender, nondistended   Neurological: Alert and oriented 3. Moves all extremities 4 . Cranial nerves II through XII grossly intact.  Skin: Warm and dry. No rashes or lesions.  Extremities: RLE with swelling, several open wounds, erythema appears to continue to improve    Data Reviewed: I have personally reviewed following labs and imaging studies    CBC:  Recent Labs   Lab 06/08/20  0425 06/09/20  0644 06/10/20  0505 06/11/20  0619 06/12/20  0421   WBC 14.3* 12.6* 12.4* 11.2* 11.9*   NEUTROABS  --   --  8.2*  --  7.3   HGB 12.3* 12.3* 12.5* 13.1 12.9*   HCT 36.3* 36.8* 37.7* 39.3 38.4*   MCV  87.9 87.4 87.3 86.2 86.5   PLT 582* 633* 716* 734* 804*     Basic Metabolic Panel:  Recent Labs   Lab 06/08/20  0425 06/09/20  0644 06/10/20  0505 06/11/20  0619 06/12/20  0421   NA 130* 132* 130* 129* 127*   K 3.9 3.9 4.1 3.5 3.2*   CL 97* 97* 98 93* 89*   CO2 24 25 25 25 25    GLUCOSE 112* 116* 135* 113* 119*   BUN 14 16 18 19  22*   CREATININE 1.17 1.21 1.15 1.47* 1.62*   CALCIUM 8.0* 8.6* 8.4* 8.9 9.1   MG 2.4 2.3 2.4 2.2 2.2   PHOS  --   --  3.9  --  4.3     GFR:  Estimated Creatinine Clearance: 73.4 mL/min (A) (by C-G formula based on SCr of 1.62 mg/dL (H)).    Liver Function Tests:  Recent Labs   Lab 06/10/20  0505 06/12/20  0421   AST 27 25   ALT 33 27   ALKPHOS 61 67   BILITOT 0.7 0.9   PROT 8.4* 9.0*   ALBUMIN 2.7* 3.1*     CBG:  No results for input(s): GLUCAP in the last 168 hours.  Recent Results (from the past 240 hour(s))   Aerobic Culture (superficial specimen)     Status: None    Collection Time: 06/04/20 10:53 PM    Specimen: Leg; Wound   Result Value Ref Range Status    Specimen Description   Final     LEG LEFT  Performed at [facility], [address], [place], [place] [address]     Special Requests   Final     NONE  Performed at [facility], [address] [place], [place] [address]     Gram Stain   Final     RARE WBC PRESENT, PREDOMINANTLY PMN  NO ORGANISMS SEEN     Culture   Final     NO GROWTH 2 DAYS  Performed at [facility], [address] [place],  [address]     Report Status 06/07/2020 FINAL  Final       Radiology Studies:  No results found.    Scheduled Meds:  ? enoxaparin (LOVENOX) injection  0.5 mg/kg Subcutaneous Q24H   ? hydrOXYzine  100 mg Oral QHS   ? lidocaine  1 patch Transdermal Q24H   ? polyethylene glycol  34 g Oral BID   ? senna-docusate  1 tablet Oral BID   ? traZODone  50 mg Oral QHS     Continuous Infusions:  ? sodium chloride 75 mL/hr at 06/12/20 9629   ?  ceFAZolin (ANCEF) IV 2 g (06/12/20 1330)      LOS: 12 days     Time spent:  over 30 min    Lacretia Nicks, MD  [facility]    To contact the attending provider between 7A-7P or the covering provider during after hours 7P-7A, please log into the web site [email or web address] and access using universal [facility] password for that web site. If you do not have the password, please call the hospital operator.    06/12/2020, 4:26 PM   Electronically signed by Zigmund Daniel., MD at 06/12/2020  4:43 PM EDT

## 2020-06-13 LAB — CBC WITH DIFFERENTIAL/PLATELET
Abs Immature Granulocytes: 0.08 10*3/uL — ABNORMAL HIGH (ref 0.00–0.07)
Basophils Absolute: 0.1 10*3/uL (ref 0.0–0.1)
Basophils Relative: 1 %
Eosinophils Absolute: 0.2 10*3/uL (ref 0.0–0.5)
Eosinophils Relative: 2 %
HCT: 35.8 % — ABNORMAL LOW (ref 39.0–52.0)
Hemoglobin: 12 g/dL — ABNORMAL LOW (ref 13.0–17.0)
Immature Granulocytes: 1 %
Lymphocytes Relative: 22 %
Lymphs Abs: 2 10*3/uL (ref 0.7–4.0)
MCH: 29.4 pg (ref 26.0–34.0)
MCHC: 33.5 g/dL (ref 30.0–36.0)
MCV: 87.7 fL (ref 80.0–100.0)
Monocytes Absolute: 1 10*3/uL (ref 0.1–1.0)
Monocytes Relative: 11 %
Neutro Abs: 6 10*3/uL (ref 1.7–7.7)
Neutrophils Relative %: 63 %
Platelets: 707 10*3/uL — ABNORMAL HIGH (ref 150–400)
RBC: 4.08 MIL/uL — ABNORMAL LOW (ref 4.22–5.81)
RDW: 13.6 % (ref 11.5–15.5)
WBC: 9.3 10*3/uL (ref 4.0–10.5)
nRBC: 0 % (ref 0.0–0.2)

## 2020-06-13 LAB — COMPREHENSIVE METABOLIC PANEL
ALT: 20 U/L (ref 0–44)
AST: 21 U/L (ref 15–41)
Albumin: 2.9 g/dL — ABNORMAL LOW (ref 3.5–5.0)
Alkaline Phosphatase: 63 U/L (ref 38–126)
Anion gap: 9 (ref 5–15)
BUN: 22 mg/dL — ABNORMAL HIGH (ref 6–20)
CO2: 25 mmol/L (ref 22–32)
Calcium: 8.5 mg/dL — ABNORMAL LOW (ref 8.9–10.3)
Chloride: 97 mmol/L — ABNORMAL LOW (ref 98–111)
Creatinine, Ser: 1.34 mg/dL — ABNORMAL HIGH (ref 0.61–1.24)
GFR, Estimated: >60 mL/min (ref >60)
Glucose, Bld: 113 mg/dL — ABNORMAL HIGH (ref 70–99)
Potassium: 3.2 mmol/L — ABNORMAL LOW (ref 3.5–5.1)
Sodium: 131 mmol/L — ABNORMAL LOW (ref 135–145)
Total Bilirubin: 0.5 mg/dL (ref 0.3–1.2)
Total Protein: 8.2 g/dL — ABNORMAL HIGH (ref 6.5–8.1)

## 2020-06-13 LAB — SEDIMENTATION RATE: Sed Rate: 101 mm/hr — ABNORMAL HIGH (ref 0–20)

## 2020-06-13 LAB — PHOSPHORUS: Phosphorus: 3.1 mg/dL (ref 2.5–4.6)

## 2020-06-13 LAB — MAGNESIUM: Magnesium: 2.3 mg/dL (ref 1.7–2.4)

## 2020-06-13 LAB — C-REACTIVE PROTEIN: CRP: 5.7 mg/dL — ABNORMAL HIGH (ref <1.0)

## 2020-06-13 MED ORDER — LACTULOSE 10 GM/15ML PO SOLN
20.0000 g | Freq: Two times a day (BID) | ORAL | Status: DC | PRN
  Administered 2020-06-13: 20 g via ORAL
  Filled 2020-06-13: qty 30

## 2020-06-13 MED ORDER — POTASSIUM CHLORIDE CRYS ER 20 MEQ PO TBCR
40.0000 meq | EXTENDED_RELEASE_TABLET | ORAL | Status: AC
  Administered 2020-06-13 (×2): 40 meq via ORAL
  Filled 2020-06-13 (×2): qty 2

## 2020-06-13 NOTE — Progress Notes (Signed)
Patient states he does not want the nurse tech to come back in his room to do 12 am vitals.   Arlyce Harman

## 2020-06-13 NOTE — Progress Notes (Signed)
PROGRESS NOTE    Stephen [name]  TKW:[id number] DOB: 1965/11/07 DOA: 05/31/2020 PCP: Patient, No Pcp Per   Chief Complaint  Patient presents with  . Leg Pain    Brief Narrative:  54 y.o.malewith medical history significant ofHTN,who presents with left leg pain, swelling, weeping.  Patient states that his left leg pain started approximately 5 days ago, which has been progressively worsening. 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  Ultrasound on admission negative.  Patient presented with left leg complaints also complains of constipation neck pain.  Started on broad-spectrum IV antibiotics on admission.  Patient's main complaints are constipation and neck pain.  KUB negative for obstruction.  X-ray C-spine negative for fracture.  Assessment & Plan:   Principal Problem:   Left leg cellulitis Active Problems:   Hypertension   Sepsis ([place])   Hypokalemia   AKI (acute kidney injury) ([place])   Cellulitis of left leg  Severe Sepsis due to left leg cellulitis: Sepsis ruled in  Left lower extremity venous Doppler isnegative for DVT MRI 10/29 with diffuse and marked subcutaneous soft tissue swelling/edema/fluid - numerous skin lesions, likely blisters.  Possible developing abscesses.  No myofasciitis or pyomyositis.  No septic arthritis or osteomyelitis. ESR, CRP elevated, follow  started on vanc (10/25 - 10/27), ceftriaxone (10/25 - 11/2).  Clindamycin 10/26-26.  Linezolid 10/28-11/2.  Ancef 11/3 - present.  --ID consulted, collected fluid from blister for culture, NGTD. --Vascular surgery performed local debridement at bedside 10/30, signed off --TOC attempted to make referral to outpatient wound care center, however, long wait time. Plan: --continue cefazolin.  RLE appears  gradually improved, continue abx per ID -> plan 1 more day IV abx, then d/c tomorrow with keflex with outpatient wound care. --elevation on 6 pillows. --wound care consult -> recommending ace wraps for minimal compression and leg elevation.  Needs vascular w/u outpatient and clear diagnosis (venous insufficiency vs lymphedema).   --Will follow up with outpatient vascular clinic after discharge  # Severe LLE edema # possible lymphedema --LLE edema worsened with development of Serenity Batley layer of edema with multiple blisters, likely due to IVF given since presentation. --MIVF d/c'ed on 10/27.  Had been receiving IV lasix 40 mg BID.  Held on 10/31 due to Cr increase, resumed on 11/1. PLAN: - follow echocardiogram - EF 45-50%, no RWMA, diastolic dysfunction  --hold lasix again today, will likely d/c with oral lasix --outpatient followup with vascular for ABI and venous duplex (rule out reflux) --Outpatient followup with vascular for Un[place]oot wrap weekly  AKI (acute kidney injury) ([place]) Creatinine 1.49, BUN 25, no baseline creatinine available, may be due to dehydration versus sepsis. 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  A1c 6.3. --BG within inpatient goal.  No need for fingersticks or SSI.  Thrombocythemia  --plt normal on presentation.  Likely reactive. --trend for now  DVT prophylaxis: lovenox Code Status: full  Family Communication: none at bedside Disposition:   Status is: Inpatient  Remains inpatient appropriate because:Inpatient level of care appropriate due to severity of illness   Dispo: The patient is from: Home              Anticipated d/c is to: SNF              Anticipated d/c date is: > 3 days              Patient currently is not medically stable to d/c. Consultants:   ID  vascular  Procedures:  Local debridement 10/30  LE [place]  IMPRESSION: No evidence of deep venous thrombosis in the left lower extremity. Right common femoral vein also present. Diffuse left lower extremity soft tissue edema noted. Antimicrobials: Anti-infectives (From admission, onward)   Start     Dose/Rate Route Frequency Ordered Stop   06/09/20 0800  ceFAZolin (ANCEF) IVPB 2g/100 mL premix        2 g 200 mL/hr over 30 Minutes Intravenous Every 8 hours 06/09/20 0036     06/07/20 2000  linezolid (ZYVOX) tablet 600 mg  Status:  Discontinued        600 mg Oral Every 12 hours 06/07/20 1431 06/09/20 0036   06/03/20 1800  linezolid (ZYVOX) IVPB 600 mg  Status:  Discontinued        600 mg 300 mL/hr over 60 Minutes Intravenous Every 12 hours 06/03/20 1655 06/07/20 1431   06/01/20 1500  clindamycin (CLEOCIN) IVPB 900 mg  Status:  Discontinued        900 mg 100 mL/hr over 30 Minutes Intravenous Every 8 hours 06/01/20 1334 06/02/20 1302   06/01/20 0400  vancomycin (VANCOCIN) IVPB 1000 mg/200 mL premix  Status:  Discontinued        1,000 mg 200 mL/hr over 60 Minutes Intravenous Every 12 hours 05/31/20 1658 06/03/20 1655   05/31/20 1500  vancomycin  (VANCOREADY) IVPB 1500 mg/300 mL        1,500 mg 150 mL/hr over 120 Minutes Intravenous  Once 05/31/20 1358 05/31/20 1815   05/31/20 1415  cefTRIAXone (ROCEPHIN) 2 g in sodium chloride 0.9 % 100 mL IVPB  Status:  Discontinued        2 g 200 mL/hr over 30 Minutes Intravenous Every 24 hours 05/31/20 1404 06/08/20 1433   05/31/20 1215  vancomycin (VANCOCIN) IVPB 1000 mg/200 mL premix        1,000 mg 200 mL/hr over 60 Minutes Intravenous  Once 05/31/20 1204 05/31/20 1500     Subjective: No new complaints  Objective: Vitals:   06/12/20 1533 06/12/20 2137 06/13/20 0827 06/13/20 1251  BP: 134/80 131/87 (!) 146/79 121/84  Pulse: 84 87 82 86  Resp:  $Remo'17 20 18  'qfPzE$ Temp: 98.5 F (36.9 C) 98.5 F (36.9 C) 97.8 F (36.6 C) 98 F (36.7 C)  TempSrc:  Oral Oral Oral  SpO2: 95% 97% 96% 98%  Weight:      Height:        Intake/Output Summary (Last 24 hours) at 06/13/2020 1442 Last data filed at 06/13/2020 0400 Gross  per 24 hour  Intake 915.46 ml  Output 300 ml  Net 615.46 ml   Filed Weights   05/31/20 1004  Weight: 136.1 kg    Examination:  General: No acute distress. Cardiovascular: Heart sounds show Nikitas Davtyan regular rate, and rhythm.  Lungs: Clear to auscultation bilaterally Abdomen: Soft, nontender, nondistended Neurological: Alert and oriented 3. Moves all extremities 4 . Cranial nerves II through XII grossly intact. Skin: Warm and dry. No rashes or lesions. Extremities: LLE edema with several open wounds, improving erythema, no crepitus   Data Reviewed: I have personally reviewed following labs and imaging studies  CBC: Recent Labs  Lab 06/09/20 0644 06/10/20 0505 06/11/20 0619 06/12/20 0421 06/13/20 0652  WBC 12.6* 12.4* 11.2* 11.9* 9.3  NEUTROABS  --  8.2*  --  7.3 6.0  HGB 12.3* 12.5* 13.1 12.9* 12.0*  HCT 36.8* 37.7* 39.3 38.4* 35.8*  MCV 87.4 87.3 86.2 86.5 87.7  PLT 633* 716* 734* 804* 707*    Basic Metabolic Panel: Recent Labs  Lab 06/09/20 0644 06/10/20 0505  06/11/20 0619 06/12/20 0421 06/13/20 0652  NA 132* 130* 129* 127* 131*  K 3.9 4.1 3.5 3.2* 3.2*  CL 97* 98 93* 89* 97*  CO2 $Re'25 25 25 25 25  'Qoi$ GLUCOSE 116* 135* 113* 119* 113*  BUN $Re'16 18 19 'AQi$ 22* 22*  CREATININE 1.21 1.15 1.47* 1.62* 1.34*  CALCIUM 8.6* 8.4* 8.9 9.1 8.5*  MG 2.3 2.4 2.2 2.2 2.3  PHOS  --  3.9  --  4.3 3.1    GFR: Estimated Creatinine Clearance: 88.8 mL/min (Kinga Cassar) (by C-G formula based on SCr of 1.34 mg/dL (H)).  Liver Function Tests: Recent Labs  Lab 06/10/20 0505 06/12/20 0421 06/13/20 0652  AST $Re'27 25 21  'GRr$ ALT 33 27 20  ALKPHOS 61 67 63  BILITOT 0.7 0.9 0.5  PROT 8.4* 9.0* 8.2*  ALBUMIN 2.7* 3.1* 2.9*    CBG: No results for input(s): GLUCAP in the last 168 hours.   Recent Results (from the past 240 hour(s))  Aerobic Culture (superficial specimen)     Status: None   Collection Time: 06/04/20 10:53 PM   Specimen: Leg; Wound  Result Value Ref Range Status   Specimen Description   Final    LEG LEFT Performed at [facility], [address] [place], [place] [address]    Special Requests   Final    NONE Performed at [facility], [address] [place], [place] [address]    Gram Stain   Final    RARE WBC PRESENT, PREDOMINANTLY PMN NO ORGANISMS SEEN    Culture   Final    NO GROWTH 2 DAYS Performed at [facility], [place] [address] [place], [place] [address]    Report Status 06/07/2020 FINAL  Final         Radiology Studies: No results found.      Scheduled Meds: . carvedilol  3.125 mg Oral BID WC  . enoxaparin (LOVENOX) injection  0.5 mg/kg Subcutaneous Q24H  . hydrOXYzine  100 mg Oral QHS  . lidocaine  1 patch Transdermal Q24H  . polyethylene glycol  34 g Oral BID  . senna-docusate  1 tablet Oral BID  . traZODone  50 mg Oral QHS   Continuous Infusions: .  ceFAZolin (ANCEF) IV 2 g (06/13/20 1414)     LOS: 13 days    Time spent: over 30 min    Fayrene Helper, MD [facility]   To  contact  the attending provider between 7A-7P or the covering provider during after hours 7P-7A, please log into the web site [email or web address] and access using universal [place] password for that web site. If you do not have the password, please call the hospital operator.  06/13/2020, 2:42 PM

## 2020-06-13 NOTE — Progress Notes (Signed)
Formatting of this note is different from the original.    PROGRESS NOTE    Thomas Browning  ZOX:[id number] DOB: 1965-11-06 DOA: 05/31/2020  PCP: Patient, No Pcp Per     Chief Complaint   Patient presents with   ? Leg Pain     Brief Narrative:   54 y.o.malewith medical history significant ofHTN,who presents with left leg pain, swelling, weeping.    Patient states that his left leg pain started approximately 5 days ago, which has been progressively worsening. 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  Ultrasound on admission negative.    Patient presented with left leg complaints also complains of constipation neck pain.  Started on broad-spectrum IV antibiotics on admission.    Patient's main complaints are constipation and neck pain.  KUB negative for obstruction.  X-ray C-spine negative for fracture.    Assessment & Plan:    Principal Problem:    Left leg cellulitis  Active Problems:    Hypertension    Sepsis ([facility])    Hypokalemia    AKI (acute kidney injury) ([facility])    Cellulitis of left leg    Severe Sepsis due to left leg cellulitis:  Sepsis ruled in   Left lower extremity venous Doppler isnegative for DVT  MRI 10/29 with diffuse and marked subcutaneous soft tissue swelling/edema/fluid - numerous skin lesions, likely blisters.  Possible developing abscesses.  No myofasciitis or pyomyositis.  No septic arthritis or osteomyelitis.  ESR, CRP elevated, follow   started on vanc (10/25 - 10/27), ceftriaxone (10/25 - 11/2).  Clindamycin 10/26-26.  Linezolid 10/28-11/2.  Ancef 11/3 - present.   --ID consulted, collected fluid from blister for culture, NGTD.  --Vascular surgery performed local debridement at bedside 10/30, signed off  --TOC attempted to make referral to outpatient wound care center, however, long  wait time.  Plan:  --continue cefazolin.  RLE appears gradually improved, continue abx per ID -> plan 1 more day IV abx, then d/c tomorrow with keflex with outpatient wound care.  --elevation on 6 pillows.  --wound care consult -> recommending ace wraps for minimal compression and leg elevation.  Needs vascular w/u outpatient and clear diagnosis (venous insufficiency vs lymphedema).    --Will follow up with outpatient vascular clinic after discharge    # Severe LLE edema  # possible lymphedema  --LLE edema worsened with development of a layer of edema with multiple blisters, likely due to IVF given since presentation.  --MIVF d/c'ed on 10/27.  Had been receiving IV lasix 40 mg BID.  Held on 10/31 due to Cr increase, resumed on 11/1.  PLAN:  - follow echocardiogram - EF 45-50%, no RWMA, diastolic dysfunction   --hold lasix again today, will likely d/c with oral lasix  --outpatient followup with vascular for ABI and venous duplex (rule out reflux)  --Outpatient followup with vascular for Un[place] wrap weekly    AKI (acute kidney injury) ([facility])  Creatinine 1.49, BUN 25, no baseline creatinine available,  may be due to dehydration versus sepsis.  --Cr improved to 1.03 after IVF and tx of cellulitis   --monitor Cr while aggressively diuresing - creatinine improving while holding diuresis    Combined Systolic and diastolic HF:   Will need cards follow up  Diuresis currently on hold  Arb when able, will start coreg    Constipation  Patient  states he is chronically constipated and takes multiple laxatives with minimal relief  Likely owing to dietary indiscretions considering his occupation as a [employer]  No obstruction or ileus noted on imaging  Moderate stool burden  Bowel regimen    Hypertension  Will need BP meds for decreased EF  Coreg, follow creatinine, start losartan when able    Hypokalemia:  --replace and follow    Cervicalgia  Patient is a truck driver  Exam significant for rigid lump in posterior  neck  Noted to soft tissue on x-ray  C-spine x-ray negative for fracture  Plan:  --cont lidocaine patch    Morbid obesity  BMI 41.8  This complicates care and prognosis    Hyperglycemia  Pre-diabetes  --No hx of DM2.  A1c 6.3.  --BG within inpatient goal.  No need for fingersticks or SSI.    Thrombocythemia   --plt normal on presentation.  Likely reactive.  --trend for now    DVT prophylaxis: lovenox  Code Status: full   Family Communication: none at bedside  Disposition:     Status is: Inpatient    Remains inpatient appropriate because:Inpatient level of care appropriate due to severity of illness    Dispo: The patient is from: Home               Anticipated d/c is to: SNF               Anticipated d/c date is: > 3 days               Patient currently is not medically stable to d/c.  Consultants:    ID   vascular    Procedures:   Local debridement 10/30    LE [place]    IMPRESSION:  No evidence of deep venous thrombosis in the left lower extremity.  Right common femoral vein also present. Diffuse left lower extremity  soft tissue edema noted.  Antimicrobials:  Anti-infectives (From admission, onward)    Start     Dose/Rate Route Frequency Ordered Stop    06/09/20 0800  ceFAZolin (ANCEF) IVPB 2g/100 mL premix         2 g  200 mL/hr over 30 Minutes Intravenous Every 8 hours 06/09/20 0036      06/07/20 2000  linezolid (ZYVOX) tablet 600 mg  Status:  Discontinued         600 mg Oral Every 12 hours 06/07/20 1431 06/09/20 0036    06/03/20 1800  linezolid (ZYVOX) IVPB 600 mg  Status:  Discontinued         600 mg  300 mL/hr over 60 Minutes Intravenous Every 12 hours 06/03/20 1655 06/07/20 1431    06/01/20 1500  clindamycin (CLEOCIN) IVPB 900 mg  Status:  Discontinued         900 mg  100 mL/hr over 30 Minutes Intravenous Every 8 hours 06/01/20 1334 06/02/20 1302    06/01/20 0400  vancomycin (VANCOCIN) IVPB 1000 mg/200 mL premix  Status:  Discontinued         1,000 mg  200 mL/hr over 60 Minutes Intravenous Every 12 hours  05/31/20 1658 06/03/20 1655    05/31/20 1500  vancomycin (VANCOREADY) IVPB 1500 mg/300 mL         1,500 mg  150 mL/hr over 120 Minutes Intravenous  Once 05/31/20 1358 05/31/20 1815    05/31/20 1415  cefTRIAXone (ROCEPHIN) 2 g in sodium chloride 0.9 % 100 mL IVPB  Status:  Discontinued  2 g  200 mL/hr over 30 Minutes Intravenous Every 24 hours 05/31/20 1404 06/08/20 1433    05/31/20 1215  vancomycin (VANCOCIN) IVPB 1000 mg/200 mL premix         1,000 mg  200 mL/hr over 60 Minutes Intravenous  Once 05/31/20 1204 05/31/20 1500       Subjective:  No new complaints    Objective:  Vitals:    06/12/20 1533 06/12/20 2137 06/13/20 0827 06/13/20 1251   BP: 134/80 131/87 (!) 146/79 121/84   Pulse: 84 87 82 86   Resp:  17 20 18    Temp: 98.5 F (36.9 C) 98.5 F (36.9 C) 97.8 F (36.6 C) 98 F (36.7 C)   TempSrc:  Oral Oral Oral   SpO2: 95% 97% 96% 98%   Weight:       Height:         Intake/Output Summary (Last 24 hours) at 06/13/2020 1442  Last data filed at 06/13/2020 0400  Gross per 24 hour   Intake 915.46 ml   Output 300 ml   Net 615.46 ml     Filed Weights    05/31/20 1004   Weight: 136.1 kg     Examination:    General: No acute distress.  Cardiovascular: Heart sounds show a regular rate, and rhythm.   Lungs: Clear to auscultation bilaterally  Abdomen: Soft, nontender, nondistended  Neurological: Alert and oriented 3. Moves all extremities 4 . Cranial nerves II through XII grossly intact.  Skin: Warm and dry. No rashes or lesions.  Extremities: LLE edema with several open wounds, improving erythema, no crepitus    Data Reviewed: I have personally reviewed following labs and imaging studies    CBC:  Recent Labs   Lab 06/09/20  0644 06/10/20  0505 06/11/20  0619 06/12/20  0421 06/13/20  0652   WBC 12.6* 12.4* 11.2* 11.9* 9.3   NEUTROABS  --  8.2*  --  7.3 6.0   HGB 12.3* 12.5* 13.1 12.9* 12.0*   HCT 36.8* 37.7* 39.3 38.4* 35.8*   MCV 87.4 87.3 86.2 86.5 87.7   PLT 633* 716* 734* 804* 707*     Basic Metabolic  Panel:  Recent Labs   Lab 06/09/20  0644 06/10/20  0505 06/11/20  0619 06/12/20  0421 06/13/20  0652   NA 132* 130* 129* 127* 131*   K 3.9 4.1 3.5 3.2* 3.2*   CL 97* 98 93* 89* 97*   CO2 25 25 25 25 25    GLUCOSE 116* 135* 113* 119* 113*   BUN 16 18 19  22* 22*   CREATININE 1.21 1.15 1.47* 1.62* 1.34*   CALCIUM 8.6* 8.4* 8.9 9.1 8.5*   MG 2.3 2.4 2.2 2.2 2.3   PHOS  --  3.9  --  4.3 3.1     GFR:  Estimated Creatinine Clearance: 88.8 mL/min (A) (by C-G formula based on SCr of 1.34 mg/dL (H)).    Liver Function Tests:  Recent Labs   Lab 06/10/20  0505 06/12/20  0421 06/13/20  0652   AST 27 25 21    ALT 33 27 20   ALKPHOS 61 67 63   BILITOT 0.7 0.9 0.5   PROT 8.4* 9.0* 8.2*   ALBUMIN 2.7* 3.1* 2.9*     CBG:  No results for input(s): GLUCAP in the last 168 hours.    Recent Results (from the past 240 hour(s))   Aerobic Culture (superficial specimen)     Status: None  Collection Time: 06/04/20 10:53 PM    Specimen: Leg; Wound   Result Value Ref Range Status    Specimen Description   Final     LEG LEFT  Performed at [facility], [address] [place], [place] [address]     Special Requests   Final     NONE  Performed at [facility], [address] [place], [place] [address]     Gram Stain   Final     RARE WBC PRESENT, PREDOMINANTLY PMN  NO ORGANISMS SEEN     Culture   Final     NO GROWTH 2 DAYS  Performed at [facility], [address], [place], [place] [address]     Report Status 06/07/2020 FINAL  Final       Radiology Studies:  No results found.    Scheduled Meds:  ? carvedilol  3.125 mg Oral BID WC   ? enoxaparin (LOVENOX) injection  0.5 mg/kg Subcutaneous Q24H   ? hydrOXYzine  100 mg Oral QHS   ? lidocaine  1 patch Transdermal Q24H   ? polyethylene glycol  34 g Oral BID   ? senna-docusate  1 tablet Oral BID   ? traZODone  50 mg Oral QHS     Continuous Infusions:  ?  ceFAZolin (ANCEF) IV 2 g (06/13/20 1414)      LOS: 13 days     Time spent: over 30 min    Lacretia Nicks, MD  [facility]    To contact the attending provider between 7A-7P or the covering provider during after hours 7P-7A, please log into the web site [email or web address] and access using universal [facility] password for that web site. If you do not have the password, please call the hospital operator.    06/13/2020, 2:42 PM   Electronically signed by Zigmund Daniel., MD at 06/13/2020  2:45 PM EST

## 2020-06-13 NOTE — Progress Notes (Signed)
Formatting of this note might be different from the original.  Patient states he does not want the nurse tech to come back in his room to do 12 am vitals.     Arlyce Harman    Electronically signed by Arlyce Harman, RN at 06/13/2020 10:36 PM EST

## 2020-06-14 LAB — CBC WITH DIFFERENTIAL/PLATELET
Abs Immature Granulocytes: 0.07 10*3/uL (ref 0.00–0.07)
Basophils Absolute: 0.1 10*3/uL (ref 0.0–0.1)
Basophils Relative: 1 %
Eosinophils Absolute: 0.2 10*3/uL (ref 0.0–0.5)
Eosinophils Relative: 2 %
HCT: 35.6 % — ABNORMAL LOW (ref 39.0–52.0)
Hemoglobin: 11.8 g/dL — ABNORMAL LOW (ref 13.0–17.0)
Immature Granulocytes: 1 %
Lymphocytes Relative: 24 %
Lymphs Abs: 2.1 10*3/uL (ref 0.7–4.0)
MCH: 29.3 pg (ref 26.0–34.0)
MCHC: 33.1 g/dL (ref 30.0–36.0)
MCV: 88.3 fL (ref 80.0–100.0)
Monocytes Absolute: 1.2 10*3/uL — ABNORMAL HIGH (ref 0.1–1.0)
Monocytes Relative: 13 %
Neutro Abs: 5.2 10*3/uL (ref 1.7–7.7)
Neutrophils Relative %: 59 %
Platelets: 705 10*3/uL — ABNORMAL HIGH (ref 150–400)
RBC: 4.03 MIL/uL — ABNORMAL LOW (ref 4.22–5.81)
RDW: 13.5 % (ref 11.5–15.5)
WBC: 8.8 10*3/uL (ref 4.0–10.5)
nRBC: 0 % (ref 0.0–0.2)

## 2020-06-14 LAB — SEDIMENTATION RATE: Sed Rate: >140 mm/hr — ABNORMAL HIGH (ref 0–20)

## 2020-06-14 LAB — MAGNESIUM: Magnesium: 2.2 mg/dL (ref 1.7–2.4)

## 2020-06-14 LAB — COMPREHENSIVE METABOLIC PANEL
ALT: 19 U/L (ref 0–44)
AST: 23 U/L (ref 15–41)
Albumin: 2.9 g/dL — ABNORMAL LOW (ref 3.5–5.0)
Alkaline Phosphatase: 65 U/L (ref 38–126)
Anion gap: 8 (ref 5–15)
BUN: 17 mg/dL (ref 6–20)
CO2: 27 mmol/L (ref 22–32)
Calcium: 9.1 mg/dL (ref 8.9–10.3)
Chloride: 99 mmol/L (ref 98–111)
Creatinine, Ser: 1.29 mg/dL — ABNORMAL HIGH (ref 0.61–1.24)
GFR, Estimated: >60 mL/min (ref >60)
Glucose, Bld: 115 mg/dL — ABNORMAL HIGH (ref 70–99)
Potassium: 4 mmol/L (ref 3.5–5.1)
Sodium: 134 mmol/L — ABNORMAL LOW (ref 135–145)
Total Bilirubin: 0.6 mg/dL (ref 0.3–1.2)
Total Protein: 8.6 g/dL — ABNORMAL HIGH (ref 6.5–8.1)

## 2020-06-14 LAB — C-REACTIVE PROTEIN: CRP: 4.6 mg/dL — ABNORMAL HIGH (ref <1.0)

## 2020-06-14 LAB — PHOSPHORUS: Phosphorus: 3.2 mg/dL (ref 2.5–4.6)

## 2020-06-14 NOTE — Progress Notes (Signed)
PROGRESS NOTE    Stephen [name]  TZG:[id number] DOB: Jan 07, 1966 DOA: 05/31/2020 PCP: Stephen [name], No Pcp Per   Chief Complaint  Stephen [name] presents with  . Leg Pain    Brief Narrative:  54 y.o.malewith medical history significant ofHTN,who presents with left leg pain, swelling, weeping.  Stephen [name] states that his left leg pain started approximately 5 days ago, which has been progressively worsening. 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  Ultrasound on admission negative.  Stephen [name] presented with left leg complaints also complains of constipation neck pain.  Started on broad-spectrum IV antibiotics on admission.  Stephen [name]'s main complaints are constipation and neck pain.  KUB negative for obstruction.  X-ray C-spine negative for fracture.  Assessment & Plan:   Principal Problem:   Left leg cellulitis Active Problems:   Hypertension   Sepsis ([place])   Hypokalemia   AKI (acute kidney injury) ([place])   Cellulitis of left leg  Severe Sepsis due to left leg cellulitis: Sepsis ruled in  Left lower extremity venous Doppler isnegative for DVT MRI 10/29 with diffuse and marked subcutaneous soft tissue swelling/edema/fluid - numerous skin lesions, likely blisters.  Possible developing abscesses.  No myofasciitis or pyomyositis.  No septic arthritis or osteomyelitis. ESR, CRP elevated, follow  started on vanc (10/25 - 10/27), ceftriaxone (10/25 - 11/2).  Clindamycin 10/26-26.  Linezolid 10/28-11/2.  Ancef 11/3 - present.  --ID consulted, collected fluid from blister for culture, NGTD. --Vascular surgery performed local debridement at bedside 10/30, signed off --TOC attempted to make referral to outpatient wound care center, however, long wait time. Plan: --continue cefazolin.  RLE appears  gradually improved, continue abx per ID -> plan 1 more day IV abx, then hopefully d/c tomorrow with keflex.  Unfortunately he doesn't have insurance - will need to teach him how to do dressing changes, but I don't think he or his mother will be able to do these.  Will discuss with CM/SW regarding other options.   --elevation on 6 pillows. --wound care consult -> recommending ace wraps for minimal compression and leg elevation.  Needs vascular w/u outpatient and clear diagnosis (venous insufficiency vs lymphedema).   --Will follow up with outpatient vascular clinic after discharge  # Severe LLE edema # possible lymphedema --LLE edema worsened with development of [name] layer of edema with multiple blisters, likely due to IVF given since presentation. --MIVF d/c'ed on 10/27.  Had been receiving IV lasix 40 mg BID.  Held on 10/31 due to Cr increase, resumed on 11/1. PLAN: - follow echocardiogram - EF 45-50%, no RWMA, diastolic dysfunction  --hold lasix again today, will likely d/c with oral lasix --outpatient followup with vascular for ABI and venous duplex (rule out reflux) --Outpatient followup with vascular for [place] wrap weekly  AKI (acute kidney injury) ([name]) Creatinine 1.49, BUN 25, no baseline creatinine available, may be due to dehydration versus sepsis. 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  A1c 6.3. --BG within inpatient goal.  No need for fingersticks or SSI.  Thrombocythemia  --plt normal on presentation.  Likely reactive. --trend for now  DVT prophylaxis: lovenox Code Status: full  Family Communication: none at bedside Disposition:   Status is: Inpatient  Remains inpatient appropriate because:Inpatient level of care appropriate due to severity of illness   Dispo: The Stephen [name] is from: Home              Anticipated d/c is to: SNF              Anticipated d/c date is: > 3 days              Stephen [name] currently is not medically stable to d/c. Consultants:   ID  vascular  Procedures:  Local debridement 10/30  LE US  IMPRESSION: No evidence of deep venous thrombosis in the left lower extremity. Right common femoral vein also present. Diffuse left lower extremity soft tissue edema noted. Antimicrobials: Anti-infectives (From admission, onward)   Start     Dose/Rate Route Frequency Ordered Stop   06/09/20 0800  ceFAZolin (ANCEF) IVPB 2g/100 mL premix        2 g 200 mL/hr over 30 Minutes Intravenous Every 8 hours 06/09/20 0036     06/07/20 2000  linezolid (ZYVOX) tablet 600 mg  Status:  Discontinued        600 mg Oral Every 12 hours 06/07/20 1431 06/09/20 0036   06/03/20 1800  linezolid (ZYVOX) IVPB 600 mg  Status:  Discontinued        600 mg 300 mL/hr over 60 Minutes Intravenous Every 12 hours 06/03/20 1655 06/07/20 1431   06/01/20 1500  clindamycin (CLEOCIN) IVPB 900 mg  Status:  Discontinued        900 mg 100 mL/hr over 30 Minutes Intravenous Every 8 hours 06/01/20 1334 06/02/20 1302   06/01/20 0400  vancomycin  (VANCOCIN) IVPB 1000 mg/200 mL premix  Status:  Discontinued        1,000 mg 200 mL/hr over 60 Minutes Intravenous Every 12 hours 05/31/20 1658 06/03/20 1655   05/31/20 1500  vancomycin (VANCOREADY) IVPB 1500 mg/300 mL        1,500 mg 150 mL/hr over 120 Minutes Intravenous  Once 05/31/20 1358 05/31/20 1815   05/31/20 1415  cefTRIAXone (ROCEPHIN) 2 g in sodium chloride 0.9 % 100 mL IVPB  Status:  Discontinued        2 g 200 mL/hr over 30 Minutes Intravenous Every 24 hours 05/31/20 1404 06/08/20 1433   05/31/20 1215  vancomycin (VANCOCIN) IVPB 1000 mg/200 mL premix        1,000 mg 200 mL/hr over 60 Minutes Intravenous  Once 05/31/20 1204 05/31/20 1500     Subjective: No new complaints No one at home who can help him, he doesn't think he can do it  Objective: Vitals:   06/13/20 1950 06/14/20 0727 06/14/20 1155 06/14/20 1543  BP: 118/80 125/66 135/85 (!) 141/87  Pulse: 88 85 80 83  Resp: $Remo'18 16 16 15  'Kextc$ Temp:  97.9 F (36.6 C) 98.7  F (37.1 C) 97.7 F (36.5 C)  TempSrc: Oral Oral Oral Oral  SpO2: 99% 98% 99% 99%  Weight:      Height:        Intake/Output Summary (Last 24 hours) at 06/14/2020 1926 Last data filed at 06/14/2020 1416 Gross per 24 hour  Intake 600 ml  Output --  Net 600 ml   Filed Weights   05/31/20 1004  Weight: 136.1 kg    Examination:  General: No acute distress. Cardiovascular: Heart sounds show Geralene Afshar regular rate, and rhythm. Lungs: Clear to auscultation bilaterally  Abdomen: Soft, nontender, nondistended  Neurological: Alert and oriented 3. Moves all extremities 4 . Cranial nerves II through XII grossly intact. Skin: Warm and dry. No rashes or lesions. Extremities: LLE with intact dressing    Data Reviewed: I have personally reviewed following labs and imaging studies  CBC: Recent Labs  Lab 06/10/20 0505 06/11/20 0619 06/12/20 0421 06/13/20 0652 06/14/20 0440  WBC 12.4* 11.2* 11.9* 9.3 8.8  NEUTROABS 8.2*  --  7.3 6.0 5.2  HGB 12.5* 13.1  12.9* 12.0* 11.8*  HCT 37.7* 39.3 38.4* 35.8* 35.6*  MCV 87.3 86.2 86.5 87.7 88.3  PLT 716* 734* 804* 707* 705*    Basic Metabolic Panel: Recent Labs  Lab 06/10/20 0505 06/11/20 0619 06/12/20 0421 06/13/20 0652 06/14/20 0440  NA 130* 129* 127* 131* 134*  K 4.1 3.5 3.2* 3.2* 4.0  CL 98 93* 89* 97* 99  CO2 $Re'25 25 25 25 27  'vmO$ GLUCOSE 135* 113* 119* 113* 115*  BUN 18 19 22* 22* 17  CREATININE 1.15 1.47* 1.62* 1.34* 1.29*  CALCIUM 8.4* 8.9 9.1 8.5* 9.1  MG 2.4 2.2 2.2 2.3 2.2  PHOS 3.9  --  4.3 3.1 3.2    GFR: Estimated Creatinine Clearance: 92.2 mL/min (Nidia Grogan) (by C-G formula based on SCr of 1.29 mg/dL (H)).  Liver Function Tests: Recent Labs  Lab 06/10/20 0505 06/12/20 0421 06/13/20 0652 06/14/20 0440  AST $Re'27 25 21 23  'GKQ$ ALT 33 $Remo'27 20 19  'ESvpu$ ALKPHOS 61 67 63 65  BILITOT 0.7 0.9 0.5 0.6  PROT 8.4* 9.0* 8.2* 8.6*  ALBUMIN 2.7* 3.1* 2.9* 2.9*    CBG: No results for input(s): GLUCAP in the last 168 hours.   Recent Results (from the past 240 hour(s))  Aerobic Culture (superficial specimen)     Status: None   Collection Time: 06/04/20 10:53 PM   Specimen: Leg; Wound  Result Value Ref Range Status   Specimen Description   Final    LEG LEFT Performed at [facility], [address], [place], [place] [address]    Special Requests   Final    NONE Performed at [facility], [address] [place], [place] [address]    Gram Stain   Final    RARE WBC PRESENT, PREDOMINANTLY PMN NO ORGANISMS SEEN    Culture   Final    NO GROWTH 2 DAYS Performed at [facility], [place] [address] [place],  [address]    Report Status 06/07/2020 FINAL  Final         Radiology Studies: No results found.      Scheduled Meds: . carvedilol  3.125 mg Oral BID WC  . enoxaparin (LOVENOX) injection  0.5 mg/kg Subcutaneous Q24H  . hydrOXYzine  100 mg Oral QHS  . lidocaine  1 patch Transdermal Q24H  . polyethylene glycol  34 g Oral BID  . senna-docusate  1  tablet Oral BID  .  traZODone  50 mg Oral QHS   Continuous Infusions: .  ceFAZolin (ANCEF) IV 2 g (06/14/20 1523)     LOS: 14 days    Time spent: over 30 min    Fayrene Helper, MD [facility]   To contact the attending provider between 7A-7P or the covering provider during after hours 7P-7A, please log into the web site [email or web address] and access using universal [place] password for that web site. If you do not have the password, please call the hospital operator.  06/14/2020, 7:26 PM

## 2020-06-14 NOTE — Progress Notes (Signed)
Physical Therapy Treatment Patient Details Name: Stephen [name] MRN: [id number] DOB: 1965-11-17 Today's Date: 06/14/2020    History of Present Illness Pt is a 54 y.o. male presenting to hospital 10/25 with L leg redness, swelling, pain, and weapage.  Pt admitted with sepsis d/t L leg cellulitis, htn, hypokalemia, AKI, and cervicalgia (rigid lump posterior neck).  PMH includes htn.    PT Comments    Pt up in room walking to bathroom with no AD and ease upon arrival.  He continues x 1 lap around unit with RW and mod I.  No LOB or safety concerns.  Remained in recliner after session.   Follow Up Recommendations  No PT follow up     Equipment Recommendations  Rolling walker with 5" wheels    Recommendations for Other Services       Precautions / Restrictions Precautions Precautions: Fall Precaution Comments: LLE wound Restrictions Weight Bearing Restrictions: No    Mobility  Bed Mobility Overal bed mobility: Independent                Transfers Overall transfer level: Modified independent Equipment used: None                Ambulation/Gait Ambulation/Gait assistance: Modified independent (Device/Increase time);Independent Gait Distance (Feet): 220 Feet Assistive device: Rolling walker (2 wheeled);None Gait Pattern/deviations: Decreased step length - right;Decreased step length - left;Wide base of support Gait velocity: WFL   General Gait Details: in room with no AD with ease, uses RW in hallway   Stairs             Wheelchair Mobility    Modified Rankin (Stroke Patients Only)       Balance Overall balance assessment: Modified Independent                                          Cognition Arousal/Alertness: Awake/alert Behavior During Therapy: WFL for tasks assessed/performed Overall Cognitive Status: Within Functional Limits for tasks assessed                                        Exercises       General Comments        Pertinent Vitals/Pain Pain Assessment: No/denies pain    Home Living                      Prior Function            PT Goals (current goals can now be found in the care plan section) Progress towards PT goals: Progressing toward goals    Frequency    Min 2X/week      PT Plan Discharge plan needs to be updated    Co-evaluation              AM-PAC PT "6 Clicks" Mobility   Outcome Measure  Help needed turning from your back to your side while in a flat bed without using bedrails?: None Help needed moving from lying on your back to sitting on the side of a flat bed without using bedrails?: None Help needed moving to and from a bed to a chair (including a wheelchair)?: None   Help needed to walk in hospital room?: None Help needed climbing 3-5 steps with a railing? :  None 6 Click Score: 20    End of Session Equipment Utilized During Treatment: Gait belt Activity Tolerance: Patient tolerated treatment well Patient left: in chair;with call bell/phone within reach;with nursing/sitter in room Nurse Communication: Mobility status Pain - Right/Left: Left Pain - part of body: Leg     Time: 9381-0175 PT Time Calculation (min) (ACUTE ONLY): 9 min  Charges:  $Gait Training: 8-22 mins                    Danielle Dess, PTA 06/14/20, 9:01 AM

## 2020-06-14 NOTE — Progress Notes (Signed)
ID Patient doing better Says leg not painfula as beofre Less swollen Patient Vitals for the past 24 hrs:  BP Temp Temp src Pulse Resp SpO2  06/14/20 0727 125/66 97.9 F (36.6 C) Oral 85 16 98 %  06/13/20 1950 118/80 99.1 F (37.3 C) Oral 88 18 99 %  06/13/20 1700 116/63 97.8 F (36.6 C) Oral 86 18 96 %  06/13/20 1251 121/84 98 F (36.7 C) Oral 86 18 98 %   Awake and alert Chest clear to auscultation Heart sounds S1-S2 Abdomen soft Left leg-  Dry scabs  06/14/20    06/14/20                  Labs CBC Latest Ref Rng & Units 06/14/2020 06/13/2020 06/12/2020  WBC 4.0 - 10.5 K/uL 8.8 9.3 11.9(H)  Hemoglobin 13.0 - 17.0 g/dL 11.8(L) 12.0(L) 12.9(L)  Hematocrit 39 - 52 % 35.6(L) 35.8(L) 38.4(L)  Platelets 150 - 400 K/uL 705(H) 707(H) 804(H)    CMP Latest Ref Rng & Units 06/14/2020 06/13/2020 06/12/2020  Glucose 70 - 99 mg/dL 115(H) 113(H) 119(H)  BUN 6 - 20 mg/dL 17 22(H) 22(H)  Creatinine 0.61 - 1.24 mg/dL 1.29(H) 1.34(H) 1.62(H)  Sodium 135 - 145 mmol/L 134(L) 131(L) 127(L)  Potassium 3.5 - 5.1 mmol/L 4.0 3.2(L) 3.2(L)  Chloride 98 - 111 mmol/L 99 97(L) 89(L)  CO2 22 - 32 mmol/L _0 Calcium 8.9 - 10.3 mg/dL 9.1 8.5(L) 9.1  Total Protein 6.5 - 8.1 g/dL 8.6(H) 8.2(H) 9.0(H)  Total Bilirubin 0.3 - 1.2 mg/dL 0.6 0.5 0.9  Alkaline Phos 38 - 126 U/L 65 63 67  AST 15 - 41 U/L _1 ALT 0 - 44 U/L _2 Impression/recommendation Severe cellulitis of the left leg with severe edema .  DOA 10/25.  He initially got vancomycin and ceftriaxone and clindamycin and then it was switched to linezolid.  Currently on cefazolin  - Day 14 Both MRI did not reveal any deep infection.  Vascular saw the patient and did superficial debridement of the blisters. Leukocytosis  Resolved No sure of the significance of increased ESR- could it be because of thrombocytosis Can be switched to PO Keflex for 10-14 days Keep leg elevated Pt needs help with Op wound dressing  and clear instructions from Wound team Wound care  follow up as OP  With vascular or [facility] Will see him in 2 weeks in my office  Hypertension on furosemide  Discussed the management with patient, his mother and hospitalist and his nurse ID will sign off- call if needed .

## 2020-06-14 NOTE — Progress Notes (Signed)
Formatting of this note is different from the original.  Images from the original note were not included.  ID  Patient doing better  Says leg not painfula as beofre  Less swollen  Patient Vitals for the past 24 hrs:   BP Temp Temp src Pulse Resp SpO2   06/14/20 0727 125/66 97.9 F (36.6 C) Oral 85 16 98 %   06/13/20 1950 118/80 99.1 F (37.3 C) Oral 88 18 99 %   06/13/20 1700 116/63 97.8 F (36.6 C) Oral 86 18 96 %   06/13/20 1251 121/84 98 F (36.7 C) Oral 86 18 98 %     Awake and alert  Chest clear to auscultation  Heart sounds S1-S2  Abdomen soft  Left leg-   Dry scabs    06/14/20    06/14/20    Labs  CBC Latest Ref Rng & Units 06/14/2020 06/13/2020 06/12/2020   WBC 4.0 - 10.5 K/uL 8.8 9.3 11.9(H)   Hemoglobin 13.0 - 17.0 g/dL 11.8(L) 12.0(L) 12.9(L)   Hematocrit 39 - 52 % 35.6(L) 35.8(L) 38.4(L)   Platelets 150 - 400 K/uL 705(H) 707(H) 804(H)     CMP Latest Ref Rng & Units 06/14/2020 06/13/2020 06/12/2020   Glucose 70 - 99 mg/dL 742(V) 956(L) 875(I)   BUN 6 - 20 mg/dL 17 43(P) 29(J)   Creatinine 0.61 - 1.24 mg/dL 1.88(C) 1.66(A) 6.30(Z)   Sodium 135 - 145 mmol/L 134(L) 131(L) 127(L)   Potassium 3.5 - 5.1 mmol/L 4.0 3.2(L) 3.2(L)   Chloride 98 - 111 mmol/L 99 97(L) 89(L)   CO2 22 - 32 mmol/L 27 25 25    Calcium 8.9 - 10.3 mg/dL 9.1 6.0(F) 9.1   Total Protein 6.5 - 8.1 g/dL 0.9(N) 8.2(H) 9.0(H)   Total Bilirubin 0.3 - 1.2 mg/dL 0.6 0.5 0.9   Alkaline Phos 38 - 126 U/L 65 63 67   AST 15 - 41 U/L 23 21 25    ALT 0 - 44 U/L 19 20 27      Impression/recommendation  Severe cellulitis of the left leg with severe edema .  DOA 10/25.  He initially got vancomycin and ceftriaxone and clindamycin and then it was switched to linezolid.  Currently on cefazolin  - Day 14  Both MRI did not reveal any deep infection.  Vascular saw the patient and did superficial debridement of the blisters.  Leukocytosis  Resolved  No sure of the significance of increased ESR- could it be because of thrombocytosis  Can be switched to PO Keflex for 10-14  days  Keep leg elevated  Pt needs help with Op wound dressing and clear instructions from Wound team  Wound care  follow up as OP  With vascular or [facility]  Will see him in 2 weeks in my office    Hypertension on furosemide    Discussed the management with patient, his mother and hospitalist and his nurse  ID will sign off- call if needed  .  Electronically signed by Lynn Ito, MD at 06/14/2020  3:48 PM EST

## 2020-06-14 NOTE — Progress Notes (Signed)
Formatting of this note is different from the original.  Physical Therapy Treatment  Patient Details  Name: Thomas Browning  MRN: [id number]  DOB: 1966-03-14  Today's Date: 06/14/2020    History of Present Illness Pt is a 54 y.o. male presenting to hospital 10/25 with L leg redness, swelling, pain, and weapage.  Pt admitted with sepsis d/t L leg cellulitis, htn, hypokalemia, AKI, and cervicalgia (rigid lump posterior neck).  PMH includes htn.      PT Comments      Pt up in room walking to bathroom with no AD and ease upon arrival.  He continues x 1 lap around unit with RW and mod I.  No LOB or safety concerns.  Remained in recliner after session.    Follow Up Recommendations   No PT follow up      Equipment Recommendations   Rolling walker with 5" wheels     Recommendations for Other Services        Precautions / Restrictions Precautions  Precautions: Fall  Precaution Comments: LLE wound  Restrictions  Weight Bearing Restrictions: No      Mobility   Bed Mobility  Overal bed mobility: Independent                  Transfers  Overall transfer level: Modified independent  Equipment used: None                  Ambulation/Gait  Ambulation/Gait assistance: Modified independent (Device/Increase time);Independent  Gait Distance (Feet): 220 Feet  Assistive device: Rolling walker (2 wheeled);None  Gait Pattern/deviations: Decreased step length - right;Decreased step length - left;Wide base of support  Gait velocity: WFL    General Gait Details: in room with no AD with ease, uses RW in hallway    Stairs              Wheelchair Mobility      Modified Rankin (Stroke Patients Only)        Balance Overall balance assessment: Modified Independent                                            Cognition Arousal/Alertness: Awake/alert  Behavior During Therapy: WFL for tasks assessed/performed  Overall Cognitive Status: Within Functional Limits for tasks assessed                                          Exercises        General Comments           Pertinent Vitals/Pain Pain Assessment: No/denies pain     Home Living                      Prior Function             PT Goals (current goals can now be found in the care plan section) Progress towards PT goals: Progressing toward goals      Frequency     Min 2X/week      PT Plan Discharge plan needs to be updated     Co-evaluation                AM-PAC PT "6 Clicks" Mobility    Outcome Measure   Help needed turning  from your back to your side while in a flat bed without using bedrails?: None  Help needed moving from lying on your back to sitting on the side of a flat bed without using bedrails?: None  Help needed moving to and from a bed to a chair (including a wheelchair)?: None    Help needed to walk in hospital room?: None  Help needed climbing 3-5 steps with a railing? : None  6 Click Score: 20      End of Session Equipment Utilized During Treatment: Gait belt  Activity Tolerance: Patient tolerated treatment well  Patient left: in chair;with call bell/phone within reach;with nursing/sitter in room  Nurse Communication: Mobility status  Pain - Right/Left: Left  Pain - part of body: Leg      Time: 7829-5621  PT Time Calculation (min) (ACUTE ONLY): 9 min    Charges:  $Gait Training: 8-22 mins             Danielle Dess, PTA  06/14/20, 9:01 AM    Electronically signed by Danielle Dess, PTA at 06/14/2020  9:02 AM EST

## 2020-06-14 NOTE — Progress Notes (Signed)
Formatting of this note is different from the original.    PROGRESS NOTE    Thomas Browning  ZOX:[id number] DOB: 04-06-1966 DOA: 05/31/2020  PCP: Patient, No Pcp Per     Chief Complaint   Patient presents with   ? Leg Pain     Brief Narrative:   54 y.o.malewith medical history significant ofHTN,who presents with left leg pain, swelling, weeping.    Patient states that his left leg pain started approximately 5 days ago, which has been progressively worsening. 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  Ultrasound on admission negative.    Patient presented with left leg complaints also complains of constipation neck pain.  Started on broad-spectrum IV antibiotics on admission.    Patient's main complaints are constipation and neck pain.  KUB negative for obstruction.  X-ray C-spine negative for fracture.    Assessment & Plan:    Principal Problem:    Left leg cellulitis  Active Problems:    Hypertension    Sepsis (HCC)    Hypokalemia    AKI (acute kidney injury) (HCC)    Cellulitis of left leg    Severe Sepsis due to left leg cellulitis:  Sepsis ruled in   Left lower extremity venous Doppler isnegative for DVT  MRI 10/29 with diffuse and marked subcutaneous soft tissue swelling/edema/fluid - numerous skin lesions, likely blisters.  Possible developing abscesses.  No myofasciitis or pyomyositis.  No septic arthritis or osteomyelitis.  ESR, CRP elevated, follow   started on vanc (10/25 - 10/27), ceftriaxone (10/25 - 11/2).  Clindamycin 10/26-26.  Linezolid 10/28-11/2.  Ancef 11/3 - present.   --ID consulted, collected fluid from blister for culture, NGTD.  --Vascular surgery performed local debridement at bedside 10/30, signed off  --TOC attempted to make referral to outpatient wound care center, however, long  wait time.  Plan:  --continue cefazolin.  RLE appears gradually improved, continue abx per ID -> plan 1 more day IV abx, then hopefully d/c tomorrow with keflex.  Unfortunately he doesn't have insurance - will need to teach him how to do dressing changes, but I don't think he or his mother will be able to do these.  Will discuss with CM/SW regarding other options.    --elevation on 6 pillows.  --wound care consult -> recommending ace wraps for minimal compression and leg elevation.  Needs vascular w/u outpatient and clear diagnosis (venous insufficiency vs lymphedema).    --Will follow up with outpatient vascular clinic after discharge    # Severe LLE edema  # possible lymphedema  --LLE edema worsened with development of a layer of edema with multiple blisters, likely due to IVF given since presentation.  --MIVF d/c'ed on 10/27.  Had been receiving IV lasix 40 mg BID.  Held on 10/31 due to Cr increase, resumed on 11/1.  PLAN:  - follow echocardiogram - EF 45-50%, no RWMA, diastolic dysfunction   --hold lasix again today, will likely d/c with oral lasix  --outpatient followup with vascular for ABI and venous duplex (rule out reflux)  --Outpatient followup with vascular for [place] wrap weekly    AKI (acute kidney injury) (HCC)  Creatinine 1.49, BUN 25, no baseline creatinine available,  may be due to dehydration versus sepsis.  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  A1c 6.3.  --BG within inpatient goal.  No need for fingersticks or SSI.    Thrombocythemia   --plt normal on presentation.  Likely reactive.  --trend for now    DVT prophylaxis: lovenox  Code Status: full   Family Communication: none at bedside  Disposition:     Status is: Inpatient    Remains inpatient appropriate because:Inpatient level of care appropriate due to severity of illness    Dispo: The patient is from: Home               Anticipated d/c is to: SNF               Anticipated d/c date is: > 3 days               Patient currently is not medically stable to d/c.  Consultants:    ID   vascular    Procedures:   Local debridement 10/30    LE [place]    IMPRESSION:  No evidence of deep venous thrombosis in the left lower extremity.  Right common femoral vein also present. Diffuse left lower extremity  soft tissue edema noted.  Antimicrobials:  Anti-infectives (From admission, onward)    Start     Dose/Rate Route Frequency Ordered Stop    06/09/20 0800  ceFAZolin (ANCEF) IVPB 2g/100 mL premix         2 g  200 mL/hr over 30 Minutes Intravenous Every 8 hours 06/09/20 0036      06/07/20 2000  linezolid (ZYVOX) tablet 600 mg  Status:  Discontinued         600 mg Oral Every 12 hours 06/07/20 1431 06/09/20 0036    06/03/20 1800  linezolid (ZYVOX) IVPB 600 mg  Status:  Discontinued         600 mg  300 mL/hr over 60 Minutes Intravenous Every 12 hours 06/03/20 1655 06/07/20 1431    06/01/20 1500  clindamycin (CLEOCIN) IVPB 900 mg  Status:  Discontinued         900 mg  100 mL/hr over 30 Minutes Intravenous Every 8 hours 06/01/20  1334 06/02/20 1302    06/01/20 0400  vancomycin (VANCOCIN) IVPB 1000 mg/200 mL premix  Status:  Discontinued         1,000 mg  200 mL/hr over 60 Minutes Intravenous Every 12 hours 05/31/20 1658 06/03/20 1655    05/31/20 1500  vancomycin (VANCOREADY) IVPB 1500 mg/300 mL         1,500  mg  150 mL/hr over 120 Minutes Intravenous  Once 05/31/20 1358 05/31/20 1815    05/31/20 1415  cefTRIAXone (ROCEPHIN) 2 g in sodium chloride 0.9 % 100 mL IVPB  Status:  Discontinued         2 g  200 mL/hr over 30 Minutes Intravenous Every 24 hours 05/31/20 1404 06/08/20 1433    05/31/20 1215  vancomycin (VANCOCIN) IVPB 1000 mg/200 mL premix         1,000 mg  200 mL/hr over 60 Minutes Intravenous  Once 05/31/20 1204 05/31/20 1500       Subjective:  No new complaints  No one at home who can help him, he doesn't think he can do it    Objective:  Vitals:    06/13/20 1950 06/14/20 0727 06/14/20 1155 06/14/20 1543   BP: 118/80 125/66 135/85 (!) 141/87   Pulse: 88 85 80 83   Resp: 18 16 16 15    Temp:  97.9 F (36.6 C) 98.7 F (37.1 C) 97.7 F (36.5 C)   TempSrc: Oral Oral Oral Oral   SpO2: 99% 98% 99% 99%   Weight:       Height:         Intake/Output Summary (Last 24 hours) at 06/14/2020 1926  Last data filed at 06/14/2020 1416  Gross per 24 hour   Intake 600 ml   Output --   Net 600 ml     Filed Weights    05/31/20 1004   Weight: 136.1 kg     Examination:    General: No acute distress.  Cardiovascular: Heart sounds show a regular rate, and rhythm.  Lungs: Clear to auscultation bilaterally   Abdomen: Soft, nontender, nondistended   Neurological: Alert and oriented 3. Moves all extremities 4 . Cranial nerves II through XII grossly intact.  Skin: Warm and dry. No rashes or lesions.  Extremities: LLE with intact dressing    Data Reviewed: I have personally reviewed following labs and imaging studies    CBC:  Recent Labs   Lab 06/10/20  0505 06/11/20  0619 06/12/20  0421 06/13/20  0652 06/14/20  0440   WBC 12.4* 11.2* 11.9* 9.3 8.8   NEUTROABS  8.2*  --  7.3 6.0 5.2   HGB 12.5* 13.1 12.9* 12.0* 11.8*   HCT 37.7* 39.3 38.4* 35.8* 35.6*   MCV 87.3 86.2 86.5 87.7 88.3   PLT 716* 734* 804* 707* 705*     Basic Metabolic Panel:  Recent Labs   Lab 06/10/20  0505 06/11/20  0619 06/12/20  0421 06/13/20  0652 06/14/20  0440   NA 130* 129* 127* 131* 134*   K 4.1 3.5 3.2* 3.2* 4.0   CL 98 93* 89* 97* 99   CO2 25 25 25 25 27    GLUCOSE 135* 113* 119* 113* 115*   BUN 18 19 22* 22* 17   CREATININE 1.15 1.47* 1.62* 1.34* 1.29*   CALCIUM 8.4* 8.9 9.1 8.5* 9.1   MG 2.4 2.2 2.2 2.3 2.2   PHOS 3.9  --  4.3 3.1 3.2     GFR:  Estimated Creatinine Clearance: 92.2 mL/min (A) (by C-G formula based on SCr of 1.29 mg/dL (H)).    Liver Function Tests:  Recent Labs   Lab 06/10/20  0505 06/12/20  0421 06/13/20  0652 06/14/20  0440   AST 27 25 21 23    ALT 33 27 20 19    ALKPHOS 61 67 63 65   BILITOT 0.7 0.9  0.5 0.6   PROT 8.4* 9.0* 8.2* 8.6*   ALBUMIN 2.7* 3.1* 2.9* 2.9*     CBG:  No results for input(s): GLUCAP in the last 168 hours.    Recent Results (from the past 240 hour(s))   Aerobic Culture (superficial specimen)     Status: None    Collection Time: 06/04/20 10:53 PM    Specimen: Leg; Wound   Result Value Ref Range Status    Specimen Description   Final     LEG LEFT  Performed at [facility], [address], [place], [place] [address]     Special Requests   Final     NONE  Performed at [facility], [address] [place], [place] [address]     Gram Stain   Final     RARE WBC PRESENT, PREDOMINANTLY PMN  NO ORGANISMS SEEN     Culture   Final     NO GROWTH 2 DAYS  Performed at [facility], [address], [place], [place] [address]     Report Status 06/07/2020 FINAL  Final       Radiology Studies:  No results found.    Scheduled Meds:  ? carvedilol  3.125 mg Oral BID WC   ? enoxaparin (LOVENOX) injection  0.5 mg/kg Subcutaneous Q24H   ? hydrOXYzine  100 mg Oral QHS   ? lidocaine  1 patch Transdermal Q24H   ? polyethylene glycol  34 g Oral BID   ?  senna-docusate  1 tablet Oral BID   ? traZODone  50 mg Oral QHS     Continuous Infusions:  ?  ceFAZolin (ANCEF) IV 2 g (06/14/20 1523)      LOS: 14 days     Time spent: over 30 min    Lacretia Nicks, MD  [facility]    To contact the attending provider between 7A-7P or the covering provider during after hours 7P-7A, please log into the web site [email or web address] and access using universal [facility] password for that web site. If you do not have the password, please call the hospital operator.    06/14/2020, 7:26 PM   Electronically signed by Zigmund Daniel., MD at 06/14/2020  7:33 PM EST

## 2020-06-15 ENCOUNTER — Other Ambulatory Visit: Payer: Self-pay | Admitting: Family Medicine

## 2020-06-15 LAB — COMPREHENSIVE METABOLIC PANEL
ALT: 15 U/L (ref 0–44)
AST: 21 U/L (ref 15–41)
Albumin: 2.8 g/dL — ABNORMAL LOW (ref 3.5–5.0)
Alkaline Phosphatase: 63 U/L (ref 38–126)
Anion gap: 9 (ref 5–15)
BUN: 16 mg/dL (ref 6–20)
CO2: 25 mmol/L (ref 22–32)
Calcium: 8.7 mg/dL — ABNORMAL LOW (ref 8.9–10.3)
Chloride: 102 mmol/L (ref 98–111)
Creatinine, Ser: 1.22 mg/dL (ref 0.61–1.24)
GFR, Estimated: >60 mL/min (ref >60)
Glucose, Bld: 102 mg/dL — ABNORMAL HIGH (ref 70–99)
Potassium: 4 mmol/L (ref 3.5–5.1)
Sodium: 136 mmol/L (ref 135–145)
Total Bilirubin: 0.6 mg/dL (ref 0.3–1.2)
Total Protein: 8.2 g/dL — ABNORMAL HIGH (ref 6.5–8.1)

## 2020-06-15 LAB — CBC WITH DIFFERENTIAL/PLATELET
Abs Immature Granulocytes: 0.06 10*3/uL (ref 0.00–0.07)
Basophils Absolute: 0.1 10*3/uL (ref 0.0–0.1)
Basophils Relative: 1 %
Eosinophils Absolute: 0.2 10*3/uL (ref 0.0–0.5)
Eosinophils Relative: 3 %
HCT: 34.8 % — ABNORMAL LOW (ref 39.0–52.0)
Hemoglobin: 11.5 g/dL — ABNORMAL LOW (ref 13.0–17.0)
Immature Granulocytes: 1 %
Lymphocytes Relative: 28 %
Lymphs Abs: 1.9 10*3/uL (ref 0.7–4.0)
MCH: 29.3 pg (ref 26.0–34.0)
MCHC: 33 g/dL (ref 30.0–36.0)
MCV: 88.5 fL (ref 80.0–100.0)
Monocytes Absolute: 0.9 10*3/uL (ref 0.1–1.0)
Monocytes Relative: 12 %
Neutro Abs: 3.8 10*3/uL (ref 1.7–7.7)
Neutrophils Relative %: 55 %
Platelets: 646 10*3/uL — ABNORMAL HIGH (ref 150–400)
RBC: 3.93 MIL/uL — ABNORMAL LOW (ref 4.22–5.81)
RDW: 13.4 % (ref 11.5–15.5)
WBC: 6.9 10*3/uL (ref 4.0–10.5)
nRBC: 0 % (ref 0.0–0.2)

## 2020-06-15 LAB — MAGNESIUM: Magnesium: 2 mg/dL (ref 1.7–2.4)

## 2020-06-15 LAB — C-REACTIVE PROTEIN: CRP: 3.4 mg/dL — ABNORMAL HIGH (ref <1.0)

## 2020-06-15 LAB — PHOSPHORUS: Phosphorus: 3.8 mg/dL (ref 2.5–4.6)

## 2020-06-15 MED ORDER — CARVEDILOL 3.125 MG PO TABS: 3.1250 mg | ORAL_TABLET | Freq: Two times a day (BID) | ORAL | 0 refills | Status: DC

## 2020-06-15 MED ORDER — POLYETHYLENE GLYCOL 3350 17 G PO PACK
17.0000 g | PACK | Freq: Every day | ORAL | 0 refills | Status: DC
Start: 2020-06-15 — End: 2020-06-15

## 2020-06-15 MED ORDER — LOSARTAN POTASSIUM 25 MG PO TABS
25.0000 mg | ORAL_TABLET | Freq: Every day | ORAL | Status: DC
  Administered 2020-06-15: 25 mg via ORAL
  Filled 2020-06-15: qty 1

## 2020-06-15 MED ORDER — CEPHALEXIN 500 MG PO CAPS: 500.0000 mg | ORAL_CAPSULE | Freq: Four times a day (QID) | ORAL | 0 refills | Status: DC

## 2020-06-15 MED ORDER — LOSARTAN POTASSIUM 25 MG PO TABS: 25.0000 mg | ORAL_TABLET | Freq: Every day | ORAL | 0 refills | Status: AC

## 2020-06-15 MED ORDER — FUROSEMIDE 20 MG PO TABS
20.0000 mg | ORAL_TABLET | Freq: Every day | ORAL | Status: DC
  Administered 2020-06-15: 20 mg via ORAL
  Filled 2020-06-15: qty 1

## 2020-06-15 MED ORDER — FUROSEMIDE 20 MG PO TABS: 20.0000 mg | ORAL_TABLET | Freq: Every day | ORAL | 0 refills | Status: DC

## 2020-06-15 MED ORDER — HYDROCERIN EX CREA: 1.0000 "application " | TOPICAL_CREAM | Freq: Every day | CUTANEOUS | 0 refills | Status: AC

## 2020-06-15 MED ORDER — HYDROCERIN EX CREA
TOPICAL_CREAM | Freq: Every day | CUTANEOUS | Status: DC
  Filled 2020-06-15: qty 113

## 2020-06-15 NOTE — Progress Notes (Signed)
Page to wound to care per md re updated wound care prior to dc and education will need to be updated as well.

## 2020-06-15 NOTE — TOC Transition Note (Signed)
Transition of Care (TOC) - CM/SW Discharge Note   Patient Details  Name: Wayden Q Kestler MRN: [id number] Date of Birth: 04/19/1966  Transition of Care (TOC) CM/SW Contact:  Misty D Green, RN Phone Number: 06/15/2020, 11:18 AM   Clinical Narrative:   RNCM met with patient in room, patient sitting up in chair on arrival. Discussed discharge plans and that [facility] but that they will not fill over the counter medications. Patient understands that to continue receiving his medications as well to [facility] he will need to fill out applications that were provided.       Barriers to Discharge: Continued Medical Work up   Patient Goals and CMS Choice        Discharge Placement                       Discharge Plan and Services                DME Arranged: Walker rolling DME Agency: [employer] Date DME Agency Contacted: 06/02/20 Time DME Agency Contacted: 1516 Representative spoke with at DME Agency: Zack HH Arranged: PT HH Agency: [facility] ([facility] Date HH Agency Contacted: 06/02/20 Time HH Agency Contacted: 1516 Representative spoke with at HH Agency: Teresa  Social Determinants of Health (SDOH) Interventions     Readmission Risk Interventions No flowsheet data found.     

## 2020-06-15 NOTE — Consult Note (Addendum)
WOC Nurse Consult Note: Refer to previous consult note on 11/3.  Requested to assess left leg prior to discharge and adjust plan of care.  Reason for Consult: Left leg previously was noted to have edema, open full thickness wounds, edema, and large amt drainage and blistering. Appearance is greatly improved.   No further open wounds, blisters, or further drainage.  Skin has evolved to large areas of dark black dry scabs, most remove easily when moistened and washed. It will take several sessions of this to remove all the affected remaining areas.  Dressing procedure/placement/frequency: Discussed topical treatment routine with patient after discharge and provided orders.  He denies further questions and verbalized understanding. Perform daily as follows: Wash left leg Q day and roughly scrub with towel when drying to assist with removal of loose skin, then apply Eucerin cream to assist with removal of loose scabbed skin. Please re-consult if further assistance is needed.  Thank-you,  Cammie Mcgee MSN, RN, CWOCN, [place], CNS [phone]

## 2020-06-15 NOTE — Plan of Care (Signed)
  Problem: Activity: Goal: Risk for activity intolerance will decrease Outcome: Progressing   Problem: Coping: Goal: Level of anxiety will decrease Outcome: Progressing   Problem: Pain Managment: Goal: General experience of comfort will improve Outcome: Progressing   Problem: Safety: Goal: Ability to remain free from injury will improve Outcome: Progressing   

## 2020-06-15 NOTE — Progress Notes (Signed)
Dc instructions given along with medications that were delivered. Pt verbalizes understanding. IV dc'd tolerated well. Pt understands OTC medications will have to be bought once out of lotion for LLE, pt taken out via w/c in stable condition with wc and belongings with him

## 2020-06-15 NOTE — Discharge Summary (Signed)
Physician Discharge Summary  Stephen [name] NGE:[id number] DOB: 16-Sep-1965 DOA: 05/31/2020  PCP: Patient, No Pcp Per  Admit date: 05/31/2020 Discharge date: 06/15/2020  Time spent: 40 minutes  Recommendations for Outpatient Follow-up:  1. Follow outpatient CBC/CMP 2. [facility]) 3. Establish with vascular and cardiology outpatient   4. Needs ABI and venous duplex (r/o reflux) outpatient with vascular 5. Follow wound care outpatient - adjust wound care as needed, he may need referral to wound care clinic outpatient (no appointments until 2022 during this hospitalization) 6. Follow response to antibiotics 7. Follow inflammatory markers outpatient (ESR sig elevated here, unclear significance, follow outpatient after continued abx use -- CRP is improving)  Discharge Diagnoses:  Principal Problem:   Left leg cellulitis Active Problems:   Hypertension   Sepsis ([place])   Hypokalemia   AKI (acute kidney injury) ([place])   Cellulitis of left leg   Discharge Condition: stable  Diet recommendation: heart healthy  Filed Weights   05/31/20 1004  Weight: 136.1 kg    History of present illness:  54 y.o.malewith medical history significant ofHTN,who presents with left leg pain, swelling, weeping.  He was admitted with LLE pain which started 5 days prior to admission with progressive worsening.  He was started on antibiotics for LLE cellulitis.  ID and vascular were c/s.  He had bedside debridement with vascular.  He's gradually improved with antibiotics and plan is for discharge home with continued abx and wound care outpatient.    See prior PN for additional details regarding H&P See below for additional details  Hospital Course:  Severe Sepsis due to left leg cellulitis: Sepsis ruled in  Left lower extremity venous Doppler isnegative for DVT MRI 10/29 with diffuse and marked subcutaneous soft tissue swelling/edema/fluid - numerous skin lesions, likely  blisters.  Possible developing abscesses.  No myofasciitis or pyomyositis.  No septic arthritis or osteomyelitis --ID consulted, collected fluid from blister for culture, NGTD. --Vascular surgery performed local debridement at bedside 10/30, signed off --TOC attempted to make referral to outpatient wound care center, however, long wait time - no appts until 2022 - started on vanc (10/25 - 10/27), ceftriaxone (10/25 - 11/2).  Clindamycin 10/26-26.  Linezolid 10/28-11/2.  Ancef 11/3 - present.  Will d/c with 10-14 days keflex per ID.  --per ID, 10-14 days keflex, unclear significance of elevated ESR, wound care, follow outpatient  --elevation on 6 pillows. --wound careconsult -> wash left leg daily, roughly scrub with towel when drying to assist with removal of loose skin, then apply eucerin cream to assist with removal of loose scabbed skin --Will follow up with outpatient vascular clinic after discharge  # Severe LLE edema # possible lymphedema? --LLE edema worsened with development of Stephen [name] layer of edema with multiple blisters, likely due to IVF given since presentation. - follow echocardiogram - EF 45-50%, no RWMA, diastolic dysfunction (see below) --s/p diuresis, will d/c with lasix  --outpatient followup with vascular for ABI and venous duplex (rule out reflux) -- follow outpatient for additional wound recs  AKI (acute kidney injury) ([place]) Creatinine 1.49, BUN 25, no baseline creatinine available, may be due to dehydration versus sepsis. --Cr 1.22 today, follow with diuresis and starting losartan  Combined Systolic and diastolic HF:  Will need cards follow up Systolic function borderline.   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 A1c 6.3. --BG within inpatient goal. No need for fingersticks or SSI.  Thrombocythemia  --plt normal on presentation. Likely reactive. --trend for now  Procedures:  Local debridement 10/30 LE US  IMPRESSION: No evidence of deep venous thrombosis in the left lower extremity. Right common femoral vein also present. Diffuse left lower extremity soft tissue edema noted.  Echo IMPRESSIONS    1. Left ventricular ejection fraction, by estimation, is 45 to 50%. The  left ventricle has mildly decreased function. The left ventricle has no  regional wall motion abnormalities. Left ventricular diastolic parameters  are consistent with Grade I  diastolic dysfunction (impaired relaxation).  2. Right ventricular systolic function is normal. The right ventricular  size is normal.  3. The mitral valve is normal in structure. Trivial mitral valve  regurgitation. No evidence of mitral stenosis.  4. The aortic valve is normal in structure. Aortic valve regurgitation is  not visualized. No aortic stenosis is present.  5. The inferior vena cava is normal in size with greater than 50%  respiratory variability, suggesting right atrial pressure of 3 mmHg.  Consultations:  ID  vascular  Discharge Exam: Vitals:   06/14/20 2030 06/15/20 0732  BP: 127/78 127/79  Pulse: 84 80  Resp:  18  Temp:  98.6 F (37 C)  SpO2: 98% 96%   Feeling better Leg is better No new complaints  General: No acute distress. Cardiovascular: Heart sounds show Stephen [name] regular rate, and rhythm.  Lungs: Clear to auscultation bilaterally Abdomen: Soft, nontender, nondistended Neurological: Alert and oriented 3. Moves all extremities 4 . Cranial nerves II  through XII grossly intact. Skin: Warm and dry. No rashes or lesions. Extremities: LLE with diffuse flaking scabs, healign open wounds, improved erythema/swelling, no crepitus/fluctuance.  No discomfort on my exam.  Discharge Instructions   Discharge Instructions    Ambulatory referral to Cardiology   Complete by: As directed    Call MD for:  difficulty breathing, headache or visual disturbances   Complete by: As directed    Call MD for:  extreme fatigue   Complete by: As directed    Call MD for:  hives   Complete by: As directed    Call MD for:  persistant dizziness or light-headedness   Complete by: As directed    Call MD for:  persistant nausea and vomiting   Complete by: As directed    Call MD for:  redness, tenderness, or signs of infection (pain, swelling, redness, odor or green/yellow discharge around incision site)   Complete by: As directed    Call MD for:  severe uncontrolled pain   Complete by: As directed    Call MD for:  temperature >100.4   Complete by: As directed    Diet - low sodium heart healthy   Complete by: As directed    Discharge instructions   Complete by: As directed    You were seen for Stephen [name] skin infection of your left leg.  This has gradually improved.  Please continue antibiotics when you go home.  We'll send you home with an additional 14 days of antibiotics.  Continue the wound care as instructed by our wound providers.    Please  establish with vascular surgery as scheduled.  The information for follow up is in your discharge paperwork.  Your ejection fraction (pump or squeeze) of your heart is low compared to normal.  It's also stiff.  We've started you on losartan, coreg, and lasix for heart failure.  I've placed Stephen [name] referral for cardiology as an outpatient.  [facility].  Please discuss getting into wound care with them as needed.  You should repeat labs within the next 1-2 weeks.  Return for  new, recurrent, or worsening symptoms.  Please ask your PCP to request records from this hospitalization so they know what was done and what the next steps will be.   Discharge wound care:   Complete by: As directed    Wash left leg Q day and roughly scrub with towel when drying to assist with removal of loose skin, then apply Eucerin cream.   Increase activity slowly   Complete by: As directed      Allergies as of 06/15/2020   No Known Allergies     Medication List    TAKE these medications   carvedilol 3.125 MG tablet Commonly known as: COREG Take 1 tablet (3.125 mg total) by mouth 2 (two) times daily with Ott Zimmerle meal.   cephALEXin 500 MG capsule Commonly known as: KEFLEX Take 1 capsule (500 mg total) by mouth 4 (four) times daily for 14 days.   furosemide 20 MG tablet Commonly known as: LASIX Take 1 tablet (20 mg total) by mouth daily.   hydrocerin Crea Apply 1 application topically daily.   losartan 25 MG tablet Commonly known as: COZAAR Take 1 tablet (25 mg total) by mouth daily.   polyethylene glycol 17 g packet Commonly known as: MIRALAX / GLYCOLAX Take 17 g by mouth daily.            Durable Medical Equipment  (From admission, onward)         Start     Ordered   06/04/20 1521  For home use only DME Walker rolling  Once       Question Answer Comment  Walker: With [place] Wheels   Patient needs Marcellus Pulliam walker to treat with the following condition Imbalance      06/04/20 1520           Discharge Care Instructions  (From admission, onward)         Start     Ordered   06/15/20 0000  Discharge wound care:       Comments: Wash left leg Q day and roughly scrub with towel when drying to assist with removal of loose skin, then apply Eucerin cream.   06/15/20 1032         No Known Allergies  Follow-up Information    Schnier, Dolores Lory, MD On 06/30/2020.   Specialties: Vascular Surgery, Cardiology, Radiology, Vascular Surgery Why: Can see Schnier or  Arna Medici. Will need ABI and venous duplex (rule out venous reflux). Seen as consult. Wound check. ;  (Appt w/ Arna Medici)  @ 1:00 pm Contact information: [place] [address] [phone]        [facility] Follow up.   Specialty: Primary Care Why: Please [facility], follow up with them outpatient to establish for primary care needs.  You should ask about cardiology follow up as well (Darion Milewski referral has been placed for you here). Contact information: [address]       Tsosie Billing, MD Follow up.   Specialty: Infectious Diseases Why: Call for Jeanny Rymer follow up with infectious disease Contact information: [place] [address] [phone]                The results of significant diagnostics from this hospitalization (including imaging, microbiology, ancillary and laboratory) are listed below for reference.    Significant Diagnostic Studies: DG Cervical Spine 2 or 3 views  Result Date: 06/01/2020 CLINICAL DATA:  Knot in the posterior aspect of the neck. EXAM: CERVICAL SPINE - 2-3 VIEW COMPARISON:  Radiographs 08/07/2017 FINDINGS: Limited evaluation from C6 inferiorly secondary to overlapping soft tissues on the lateral radiograph. No evidence of acute fracture or traumatic malalignment. Similar straightening of the normal cervical lordosis. Similar moderate disc height loss and degenerative change at C5-C6 and C6-C7. Similar large anteriorly directed osteophytes. Similar long gated bilateral styloid processes. Prevertebral soft tissues are within normal limits. Limited evaluation of the soft tissues by radiograph. IMPRESSION: No evidence of acute osseous abnormality. Similar moderate degenerative changes, as detailed above. Electronically Signed   By: Margaretha Sheffield MD   On: 06/01/2020 08:49   DG Abd 1 View  Result  Date: 06/01/2020 CLINICAL DATA:  Constipation EXAM: ABDOMEN - 1 VIEW COMPARISON:  None. FINDINGS: The bowel gas pattern is nonobstructive. Mild to moderate volume stool throughout the colon. No radio-opaque calculi or other significant radiographic abnormality are seen. IMPRESSION: Nonobstructive bowel gas pattern. Mild to moderate volume stool throughout the colon. Electronically Signed   By: Davina Poke D.O.   On: 06/01/2020 08:48   MR TIBIA FIBULA LEFT W WO CONTRAST  Result Date: 06/05/2020 CLINICAL DATA:  Left lower extremity pain and swelling. EXAM: MRI OF LOWER LEFT EXTREMITY WITHOUT AND WITH CONTRAST TECHNIQUE: Multiplanar, multisequence MR imaging of the left lower extremity was performed both before and after administration of intravenous contrast. CONTRAST:  20mL GADAVIST GADOBUTROL 1 MMOL/ML IV SOLN COMPARISON:  06/01/2020 FINDINGS: Examination is limited by patient motion. Diffuse and marked subcutaneous soft tissue swelling/edema/fluid similar to prior study. There are numerous skin lesions, likely blisters. In the upper and mid mid tibia regions anteriorly and medially there are subcutaneous fluid collections which are ill-defined and not well organized. These could be Jody Aguinaga developing abscesses. No well-formed rim enhancing drainable abscess is identified. No MR findings for myofasciitis or pyomyositis. No evidence of osteomyelitis. No findings suspicious for septic arthritis. IMPRESSION: 1. Persistent diffuse and marked subcutaneous soft tissue swelling/edema/fluid similar to prior study. There are numerous skin lesions, likely blisters. 2. In the upper and mid tibia regions anteriorly and medially there are ill-defined subcutaneous fluid collections which are not well organized. These could be Alfretta Pinch developing abscesses. No well-formed rim enhancing drainable abscess is identified. 3. No MR findings for myofasciitis or pyomyositis. 4. No findings suspicious for septic arthritis or osteomyelitis.  Electronically Signed   By: Marijo Sanes M.D.   On: 06/05/2020 08:39   MR TIBIA FIBULA LEFT W WO CONTRAST  Result Date: 06/01/2020 CLINICAL DATA:  Suspect osteomyelitis, diffuse cellulitis EXAM: MRI OF LOWER LEFT EXTREMITY WITHOUT AND WITH CONTRAST TECHNIQUE: Multiplanar, multisequence MR imaging of the left was performed both before and after administration of intravenous contrast. CONTRAST:  81mL GADAVIST GADOBUTROL 1 MMOL/ML IV SOLN COMPARISON:  None. FINDINGS: Bones/Joint/Cartilage No areas of osseous destruction or periosteal reaction. Normal osseous marrow signal seen throughout. There tibial surfaces  appear to be maintained. Ligaments Suboptimally visualized Muscles and Tendons The muscles surrounding the lower extremity are intact without focal atrophy or tear. The visualized portions of the tendons are intact. Soft tissues There is diffuse skin thickening with edema and subcutaneous edema extending to the deep fascial layers. There is non loculated fluid within the deep fascial layers. Overlying superficial epidermal cyst/boils are seen. IMPRESSION: Findings of diffuse cellulitis with superficial cyst. Non loculated fluid within the fascial layers. No evidence of osteomyelitis or abscess. Electronically Signed   By: Prudencio Pair M.D.   On: 06/01/2020 20:54   US Venous Img Lower Unilateral Left  Result Date: 05/31/2020 CLINICAL DATA:  Edema and redness EXAM: LEFT LOWER EXTREMITY VENOUS DUPLEX ULTRASOUND TECHNIQUE: Gray-scale sonography with graded compression, as well as color Doppler and duplex ultrasound were performed to evaluate the left lower extremity deep venous system from the level of the common femoral vein and including the common femoral, femoral, profunda femoral, popliteal and calf veins including the posterior tibial, peroneal and gastrocnemius veins when visible. The superficial great saphenous vein was also interrogated. Spectral Doppler was utilized to evaluate flow at rest and  with distal augmentation maneuvers in the common femoral, femoral and popliteal veins. COMPARISON:  None. FINDINGS: Contralateral Common Femoral Vein: Respiratory phasicity is normal and symmetric with the symptomatic side. No evidence of thrombus. Normal compressibility. Common Femoral Vein: No evidence of thrombus. Normal compressibility, respiratory phasicity and response to augmentation. Saphenofemoral Junction: No evidence of thrombus. Normal compressibility and flow on color Doppler imaging. Profunda Femoral Vein: No evidence of thrombus. Normal compressibility and flow on color Doppler imaging. Femoral Vein: No evidence of thrombus. Normal compressibility, respiratory phasicity and response to augmentation. Popliteal Vein: No evidence of thrombus. Normal compressibility, respiratory phasicity and response to augmentation. Calf Veins: No evidence of thrombus. Normal compressibility and flow on color Doppler imaging. Superficial Great Saphenous Vein: No evidence of thrombus. Normal compressibility. Venous Reflux:  None. Other Findings:  There is diffuse soft tissue edema. IMPRESSION: No evidence of deep venous thrombosis in the left lower extremity. Right common femoral vein also present. Diffuse left lower extremity soft tissue edema noted. Electronically Signed   By: Lowella Grip III M.D.   On: 05/31/2020 12:48   ECHOCARDIOGRAM COMPLETE  Result Date: 06/11/2020    ECHOCARDIOGRAM REPORT   Patient Name:   IRAN ROWE Date of Exam: 06/10/2020 Medical Rec #:  [id number]      Height:       71.0 in Accession #:    [id number]     Weight:       300.0 lb Date of Birth:  1966/02/03      BSA:          2.505 m Patient Age:    89 years       BP:           130/75 mmHg Patient Gender: M              HR:           86 bpm. Exam Location:  [facility] Procedure: 2D Echo, Color Doppler, Cardiac Doppler and Intracardiac            Opacification Agent Indications:     Edema  History:         Patient has no prior history of  Echocardiogram examinations.                  Risk Factors:Hypertension.  Sonographer:  Charmayne Sheer RDCS (AE) Referring Phys:  [phone] Alben Deeds JR Diagnosing Phys: Neoma Laming MD  Sonographer Comments: Technically difficult study due to poor echo windows. IMPRESSIONS  1. Left ventricular ejection fraction, by estimation, is 45 to 50%. The left ventricle has mildly decreased function. The left ventricle has no regional wall motion abnormalities. Left ventricular diastolic parameters are consistent with Grade I diastolic dysfunction (impaired relaxation).  2. Right ventricular systolic function is normal. The right ventricular size is normal.  3. The mitral valve is normal in structure. Trivial mitral valve regurgitation. No evidence of mitral stenosis.  4. The aortic valve is normal in structure. Aortic valve regurgitation is not visualized. No aortic stenosis is present.  5. The inferior vena cava is normal in size with greater than 50% respiratory variability, suggesting right atrial pressure of 3 mmHg. FINDINGS  Left Ventricle: Left ventricular ejection fraction, by estimation, is 45 to 50%. The left ventricle has mildly decreased function. The left ventricle has no regional wall motion abnormalities. Definity contrast agent was given IV to delineate the left ventricular endocardial borders. The left ventricular internal cavity size was normal in size. There is no left ventricular hypertrophy. Left ventricular diastolic parameters are consistent with Grade I diastolic dysfunction (impaired relaxation). Right Ventricle: The right ventricular size is normal. No increase in right ventricular wall thickness. Right ventricular systolic function is normal. Left Atrium: Left atrial size was normal in size. Right Atrium: Right atrial size was normal in size. Pericardium: There is no evidence of pericardial effusion. Mitral Valve: The mitral valve is normal in structure. Trivial mitral valve regurgitation. No  evidence of mitral valve stenosis. MV peak gradient, 2.2 mmHg. The mean mitral valve gradient is 1.0 mmHg. Tricuspid Valve: The tricuspid valve is normal in structure. Tricuspid valve regurgitation is trivial. No evidence of tricuspid stenosis. Aortic Valve: The aortic valve is normal in structure. Aortic valve regurgitation is not visualized. No aortic stenosis is present. Aortic valve mean gradient measures 5.0 mmHg. Aortic valve peak gradient measures 9.2 mmHg. Aortic valve area, by VTI measures 3.51 cm. Pulmonic Valve: The pulmonic valve was normal in structure. Pulmonic valve regurgitation is not visualized. No evidence of pulmonic stenosis. Aorta: The aortic root is normal in size and structure. Venous: The inferior vena cava is normal in size with greater than 50% respiratory variability, suggesting right atrial pressure of 3 mmHg. IAS/Shunts: No atrial level shunt detected by color flow Doppler.  LEFT VENTRICLE PLAX 2D LVIDd:         4.62 cm      Diastology LVIDs:         3.37 cm      LV e' medial:    8.27 cm/s LV PW:         1.13 cm      LV E/e' medial:  6.8 LV IVS:        1.03 cm      LV e' lateral:   9.03 cm/s LVOT diam:     2.40 cm      LV E/e' lateral: 6.2 LV SV:         73 LV SV Index:   29 LVOT Area:     4.52 cm  LV Volumes (MOD) LV vol d, MOD A2C: 106.0 ml LV vol d, MOD A4C: 125.0 ml LV vol s, MOD A2C: 55.9 ml LV vol s, MOD A4C: 63.8 ml LV SV MOD A2C:     50.1 ml LV SV MOD A4C:  125.0 ml LV SV MOD BP:      57.1 ml LEFT ATRIUM             Index LA diam:        2.70 cm 1.08 cm/m LA Vol (A2C):   28.1 ml 11.22 ml/m LA Vol (A4C):   15.6 ml 6.23 ml/m LA Biplane Vol: 21.1 ml 8.42 ml/m  AORTIC VALVE                   PULMONIC VALVE AV Area (Vmax):    3.39 cm    PV Vmax:       1.19 m/s AV Area (Vmean):   3.19 cm    PV Vmean:      79.300 cm/s AV Area (VTI):     3.51 cm    PV VTI:        0.183 m AV Vmax:           152.00 cm/s PV Peak grad:  5.7 mmHg AV Vmean:          99.600 cm/s PV Mean grad:  3.0  mmHg AV VTI:            0.209 m AV Peak Grad:      9.2 mmHg AV Mean Grad:      5.0 mmHg LVOT Vmax:         114.00 cm/s LVOT Vmean:        70.200 cm/s LVOT VTI:          0.162 m LVOT/AV VTI ratio: 0.78  AORTA Ao Root diam: 3.50 cm MITRAL VALVE MV Area (PHT): 3.48 cm    SHUNTS MV Peak grad:  2.2 mmHg    Systemic VTI:  0.16 m MV Mean grad:  1.0 mmHg    Systemic Diam: 2.40 cm MV Vmax:       0.75 m/s MV Vmean:      56.0 cm/s MV Decel Time: 218 msec MV E velocity: 56.30 cm/s MV Tyjae Issa velocity: 61.10 cm/s MV E/Zehra Rucci ratio:  0.92 Neoma Laming MD Electronically signed by Neoma Laming MD Signature Date/Time: 06/11/2020/1:10:09 PM    Final     Microbiology: No results found for this or any previous visit (from the past 240 hour(s)).   Labs: Basic Metabolic Panel: Recent Labs  Lab 06/10/20 0505 06/10/20 0505 06/11/20 0569 06/12/20 0421 06/13/20 0652 06/14/20 0440 06/15/20 0507  NA 130*   < > 129* 127* 131* 134* 136  K 4.1   < > 3.5 3.2* 3.2* 4.0 4.0  CL 98   < > 93* 89* 97* 99 102  CO2 25   < > $R'25 25 25 27 25  'Mq$ GLUCOSE 135*   < > 113* 119* 113* 115* 102*  BUN 18   < > 19 22* 22* 17 16  CREATININE 1.15   < > 1.47* 1.62* 1.34* 1.29* 1.22  CALCIUM 8.4*   < > 8.9 9.1 8.5* 9.1 8.7*  MG 2.4   < > 2.2 2.2 2.3 2.2 2.0  PHOS 3.9  --   --  4.3 3.1 3.2 3.8   < > = values in this interval not displayed.   Liver Function Tests: Recent Labs  Lab 06/10/20 0505 06/12/20 0421 06/13/20 0652 06/14/20 0440 06/15/20 0507  AST $Re'27 25 21 23 21  'YPs$ ALT 33 $Remo'27 20 19 15  'uICPQ$ ALKPHOS 61 67 63 65 63  BILITOT 0.7 0.9 0.5 0.6 0.6  PROT 8.4* 9.0* 8.2* 8.6* 8.2*  ALBUMIN 2.7*  3.1* 2.9* 2.9* 2.8*   No results for input(s): LIPASE, AMYLASE in the last 168 hours. No results for input(s): AMMONIA in the last 168 hours. CBC: Recent Labs  Lab 06/10/20 0505 06/10/20 0505 06/11/20 5056 06/12/20 0421 06/13/20 0652 06/14/20 0440 06/15/20 0507  WBC 12.4*   < > 11.2* 11.9* 9.3 8.8 6.9  NEUTROABS 8.2*  --   --  7.3 6.0 5.2 3.8  HGB  12.5*   < > 13.1 12.9* 12.0* 11.8* 11.5*  HCT 37.7*   < > 39.3 38.4* 35.8* 35.6* 34.8*  MCV 87.3   < > 86.2 86.5 87.7 88.3 88.5  PLT 716*   < > 734* 804* 707* 705* 646*   < > = values in this interval not displayed.   Cardiac Enzymes: No results for input(s): CKTOTAL, CKMB, CKMBINDEX, TROPONINI in the last 168 hours. BNP: BNP (last 3 results) No results for input(s): BNP in the last 8760 hours.  ProBNP (last 3 results) No results for input(s): PROBNP in the last 8760 hours.  CBG: No results for input(s): GLUCAP in the last 168 hours.     Signed:  Fayrene Helper MD.  [facility] 06/15/2020, 10:49 AM

## 2020-06-15 NOTE — Progress Notes (Signed)
Formatting of this note might be different from the original.  Page to wound to care per md re updated wound care prior to dc and education will need to be updated as well.  Electronically signed by Elmer Bales, RN at 06/15/2020  9:27 AM EST

## 2020-06-15 NOTE — Unmapped (Signed)
Formatting of this note might be different from the original.  WOC Nurse Consult Note:  Refer to previous consult note on 11/3.  Requested to assess left leg prior to discharge and adjust plan of care.   Reason for Consult: Left leg previously was noted to have edema, open full thickness wounds, edema, and large amt drainage and blistering. Appearance is greatly improved.    No further open wounds, blisters, or further drainage.  Skin has evolved to large areas of dark black dry scabs, most remove easily when moistened and washed. It will take several sessions of this to remove all the affected remaining areas.   Dressing procedure/placement/frequency: Discussed topical treatment routine with patient after discharge and provided orders.  He denies further questions and verbalized understanding. Perform daily as follows: Wash left leg Q day and roughly scrub with towel when drying to assist with removal of loose skin, then apply Eucerin cream to assist with removal of loose scabbed skin.  Please re-consult if further assistance is needed.  Thank-you,   Cammie Mcgee MSN, RN, CWOCN, [place], CNS  [phone]      Electronically signed by Leanna Battles, RN at 06/15/2020  9:41 AM EST

## 2020-06-15 NOTE — Unmapped (Signed)
Formatting of this note might be different from the original.  Transition of Care [facility]) - CM/SW Discharge Note    Patient Details   Name: Thomas Browning  MRN: [id number]  Date of Birth: 28-Mar-1966    [facility]) CM/SW Contact:   Trenton Founds, RN  Phone Number:  06/15/2020, 11:18 AM    Clinical Narrative:   RNCM met with patient in room, patient sitting up in chair on arrival. Discussed discharge plans and that meds will be [facility] but that they will not fill over the counter medications. Patient understands that to continue receiving his medications as well [facility] he will need to fill out applications that were provided.       Barriers to Discharge: Continued Medical Work up    Patient Goals and CMS Choice          Discharge Placement                Discharge Plan and Services        DME Arranged: Walker rolling  DME Agency: [employer]  Date DME Agency Contacted: 06/02/20  Time DME Agency Contacted: [phone]  Representative spoke with at DME Agency: Zack  HH Arranged: PT  HH Agency: [facility]  Date HH Agency Contacted: 06/02/20  Time HH Agency Contacted: [phone]  Representative spoke with at [facility] Agency: Rosey Bath    Social Determinants of Health (SDOH) Interventions      Readmission Risk Interventions  No flowsheet data found.      Electronically signed by Trenton Founds, RN at 06/15/2020 11:20 AM EST

## 2020-06-15 NOTE — Progress Notes (Signed)
Formatting of this note might be different from the original.  Dc instructions given along with medications that were delivered. Pt verbalizes understanding. IV dc'd tolerated well. Pt understands OTC medications will have to be bought once out of lotion for LLE, pt taken out via w/c in stable condition with wc and belongings with him  Electronically signed by Elmer Bales, RN at 06/15/2020  1:26 PM EST

## 2020-06-15 NOTE — Unmapped (Signed)
Formatting of this note might be different from the original.    Problem: Activity:  Goal: Risk for activity intolerance will decrease  Outcome: Progressing    Problem: Coping:  Goal: Level of anxiety will decrease  Outcome: Progressing    Problem: Pain Managment:  Goal: General experience of comfort will improve  Outcome: Progressing    Problem: Safety:  Goal: Ability to remain free from injury will improve  Outcome: Progressing    Electronically signed by Elmer Bales, RN at 06/15/2020  9:09 AM EST

## 2020-06-15 NOTE — Discharge Summary (Signed)
Formatting of this note is different from the original.  Physician Discharge Summary   [name]:[id number] DOB: 01-07-1966 DOA: 05/31/2020    PCP: Patient, No Pcp Per    Admit date: 05/31/2020  Discharge date: 06/15/2020    Time spent: 40 minutes    Recommendations for Outpatient Follow-up:   1. Follow outpatient CBC/CMP  2. [facility]  3. Establish with vascular and cardiology outpatient    4. Needs ABI and venous duplex (r/o reflux) outpatient with vascular  5. Follow wound care outpatient - adjust wound care as needed, he may need referral to wound care clinic outpatient (no appointments until 2022 during this hospitalization)  6. Follow response to antibiotics  7. Follow inflammatory markers outpatient (ESR sig elevated here, unclear significance, follow outpatient after continued abx use -- CRP is improving)    Discharge Diagnoses:   Principal Problem:    Left leg cellulitis  Active Problems:    Hypertension    Sepsis (HCC)    Hypokalemia    AKI (acute kidney injury) (HCC)    Cellulitis of left leg    Discharge Condition: stable    Diet recommendation: heart healthy    Filed Weights    05/31/20 1004   Weight: 136.1 kg     History of present illness:   54 y.o.malewith medical history significant ofHTN,who presents with left leg pain, swelling, weeping.  He was admitted with LLE pain which started 5 days prior to admission with progressive worsening.  He was started on antibiotics for LLE cellulitis.  ID and vascular were c/s.  He had bedside debridement with vascular.  He's gradually improved with antibiotics and plan is for discharge home with continued abx and wound care outpatient.      See prior PN for additional details regarding H&P  See below for additional details    Hospital Course:   Severe Sepsis due to left leg cellulitis:  Sepsis ruled in   Left lower extremity venous Doppler isnegative for DVT  MRI 10/29 with diffuse and marked subcutaneous soft tissue  swelling/edema/fluid - numerous skin lesions, likely blisters.  Possible developing abscesses.  No myofasciitis or pyomyositis.  No septic arthritis or osteomyelitis  --ID consulted, collected fluid from blister for culture, NGTD.  --Vascular surgery performed local debridement at bedside 10/30, signed off  --TOC attempted to make referral to outpatient wound care center, however, long wait time - no appts until 2022  - started on vanc (10/25 - 10/27), ceftriaxone (10/25 - 11/2).  Clindamycin 10/26-26.  Linezolid 10/28-11/2.  Ancef 11/3 - present.  Will d/c with 10-14 days keflex per ID.   --per ID, 10-14 days keflex, unclear significance of elevated ESR, wound care, follow outpatient   --elevation on 6 pillows.  --wound careconsult -> wash left leg daily, roughly scrub with towel when drying to assist with removal of loose skin, then apply eucerin cream to assist with removal of loose scabbed skin  --Will follow up with outpatient vascular clinic after discharge    # Severe LLE edema  # possible lymphedema?  --LLE edema worsened with development of a layer of edema with multiple blisters, likely due to IVF given since presentation.  - follow echocardiogram - EF 45-50%, no RWMA, diastolic dysfunction (see below)  --s/p diuresis, will d/c with lasix   --outpatient followup with vascular for ABI and venous duplex (rule out reflux)  -- follow outpatient for additional wound recs    AKI (acute kidney  injury) (HCC)  Creatinine 1.49, BUN 25, no baseline creatinine available,  may be due to dehydration versus sepsis.  --Cr 1.22 today, follow with diuresis and starting losartan    Combined Systolic and diastolic HF:   Will need cards follow up  Systolic function borderline.    D/c with losartan, lasix, and coreg  Referral placed to cardiology outpatient    Constipation  Bowel regimen     Hypertension  Coreg, losartan, lasix  Follow BP outpatient   Follow renal function, volume status    Hypokalemia:  --replace and  follow    Cervicalgia  Patient is a truck driver  Exam significant for rigid lump in posterior neck  Noted to soft tissue on x-ray  C-spine x-ray negative for fracture  Plan:  --cont lidocaine patch    Morbid obesity  BMI 41.8  This complicates care and prognosis    Hyperglycemia  Pre-diabetes  --No hx of DM2. A1c 6.3.  --BG within inpatient goal. No need for fingersticks or SSI.    Thrombocythemia   --plt normal on presentation. Likely reactive.  --trend for now    Procedures:   Local debridement 10/30  LE [place]    IMPRESSION:  No evidence of deep venous thrombosis in the left lower extremity.  Right common femoral vein also present. Diffuse left lower extremity  soft tissue edema noted.    Echo  IMPRESSIONS     1. Left ventricular ejection fraction, by estimation, is 45 to 50%. The   left ventricle has mildly decreased function. The left ventricle has no   regional wall motion abnormalities. Left ventricular diastolic parameters   are consistent with Grade I   diastolic dysfunction (impaired relaxation).   2. Right ventricular systolic function is normal. The right ventricular   size is normal.   3. The mitral valve is normal in structure. Trivial mitral valve   regurgitation. No evidence of mitral stenosis.   4. The aortic valve is normal in structure. Aortic valve regurgitation is   not visualized. No aortic stenosis is present.   5. The inferior vena cava is normal in size with greater than 50%   respiratory variability, suggesting right atrial pressure of 3 mmHg.    Consultations:   ID   vascular    Discharge Exam:  Vitals:    06/14/20 2030 06/15/20 0732   BP: 127/78 127/79   Pulse: 84 80   Resp:  18   Temp:  98.6 F (37 C)   SpO2: 98% 96%     Feeling better  Leg is better  No new complaints    General: No acute distress.  Cardiovascular: Heart sounds show a regular rate, and rhythm.   Lungs: Clear to auscultation bilaterally  Abdomen: Soft, nontender, nondistended  Neurological: Alert and  oriented 3. Moves all extremities 4 . Cranial nerves II through XII grossly intact.  Skin: Warm and dry. No rashes or lesions.  Extremities: LLE with diffuse flaking scabs, healign open wounds, improved erythema/swelling, no crepitus/fluctuance.  No discomfort on my exam.    Discharge Instructions    Discharge Instructions     Ambulatory referral to Cardiology   Complete by: As directed     Call MD for:  difficulty breathing, headache or visual disturbances   Complete by: As directed     Call MD for:  extreme fatigue   Complete by: As directed     Call MD for:  hives   Complete by: As directed  Call MD for:  persistant dizziness or light-headedness   Complete by: As directed     Call MD for:  persistant nausea and vomiting   Complete by: As directed     Call MD for:  redness, tenderness, or signs of infection (pain, swelling, redness, odor or green/yellow discharge around incision site)   Complete by: As directed     Call MD for:  severe uncontrolled pain   Complete by: As directed     Call MD for:  temperature >100.4   Complete by: As directed     Diet - low sodium heart healthy   Complete by: As directed     Discharge instructions   Complete by: As directed     You were seen for a skin infection of your left leg.    This has gradually improved.  Please continue antibiotics when you go home.  We'll send you home with an additional 14 days of antibiotics.  Continue the wound care as instructed by our wound providers.      Please establish with vascular surgery as scheduled.  The information for follow up is in your discharge paperwork.    Your ejection fraction (pump or squeeze) of your heart is low compared to normal.  It's also stiff.  We've started you on losartan, coreg, and lasix for heart failure.  I've placed a referral for cardiology as an outpatient.    [facility]  Please discuss getting into wound care with them as needed.    You should repeat  labs within the next 1-2 weeks.    Return for new, recurrent, or worsening symptoms.    Please ask your PCP to request records from this hospitalization so they know what was done and what the next steps will be.    Discharge wound care:   Complete by: As directed     Wash left leg Q day and roughly scrub with towel when drying to assist with removal of loose skin, then apply Eucerin cream.    Increase activity slowly   Complete by: As directed        Allergies as of 06/15/2020    No Known Allergies      Medication List     TAKE these medications    carvedilol 3.125 MG tablet  Commonly known as: COREG  Take 1 tablet (3.125 mg total) by mouth 2 (two) times daily with a meal.    cephALEXin 500 MG capsule  Commonly known as: KEFLEX  Take 1 capsule (500 mg total) by mouth 4 (four) times daily for 14 days.    furosemide 20 MG tablet  Commonly known as: LASIX  Take 1 tablet (20 mg total) by mouth daily.    hydrocerin Crea  Apply 1 application topically daily.    losartan 25 MG tablet  Commonly known as: COZAAR  Take 1 tablet (25 mg total) by mouth daily.    polyethylene glycol 17 g packet  Commonly known as: MIRALAX / GLYCOLAX  Take 17 g by mouth daily.              Durable Medical Equipment   (From admission, onward)           Start     Ordered    06/04/20 1521  For home use only DME Walker rolling  Once        Question Answer Comment   Walker: With 5 [employer]  Patient needs a walker to treat with the following condition Imbalance       06/04/20 1520             Discharge Care Instructions   (From admission, onward)           Start     Ordered    06/15/20 0000  Discharge wound care:        Comments: Wash left leg Q day and roughly scrub with towel when drying to assist with removal of loose skin, then apply Eucerin cream.    06/15/20 1032           No Known Allergies   Follow-up Information     Schnier, Latina Craver, MD On 06/30/2020.    Specialties: Vascular Surgery, Cardiology, Radiology, Vascular Surgery  Why: Can  see Schnier or Vivia Birmingham. Will need ABI and venous duplex (rule out venous reflux). Seen as consult. Wound check. ;  (Appt w/ Vivia Birmingham)  @ 1:00 pm  Contact information:  [address]  [place] [address]  [phone]         [facility] Follow up.    Specialty: Primary Care  Why: Please [facility], follow up with them outpatient to establish for primary care needs.  You should ask about cardiology follow up as well (a referral has been placed for you here).  Contact information:  [address]  [place] [address]  [phone]         Lynn Ito, MD Follow up.    Specialty: Infectious Diseases  Why: Call for a follow up with infectious disease  Contact information:  [address]  [place] [address]  [phone]                The results of significant diagnostics from this hospitalization (including imaging, microbiology, ancillary and laboratory) are listed below for reference.      Significant Diagnostic Studies:  DG Cervical Spine 2 or 3 views    Result Date: 06/01/2020  CLINICAL DATA:  Knot in the posterior aspect of the neck. EXAM: CERVICAL SPINE - 2-3 VIEW COMPARISON:  Radiographs 08/07/2017 FINDINGS: Limited evaluation from C6 inferiorly secondary to overlapping soft tissues on the lateral radiograph. No evidence of acute fracture or traumatic malalignment. Similar straightening of the normal cervical lordosis. Similar moderate disc height loss and degenerative change at C5-C6 and C6-C7. Similar large anteriorly directed osteophytes. Similar long gated bilateral styloid processes. Prevertebral soft tissues are within normal limits. Limited evaluation of the soft tissues by radiograph. IMPRESSION: No evidence of acute osseous abnormality. Similar moderate degenerative changes, as detailed above. Electronically Signed   By: Feliberto Harts MD   On: 06/01/2020 08:49     DG Abd 1 View    Result Date:  06/01/2020  CLINICAL DATA:  Constipation EXAM: ABDOMEN - 1 VIEW COMPARISON:  None. FINDINGS: The bowel gas pattern is nonobstructive. Mild to moderate volume stool throughout the colon. No radio-opaque calculi or other significant radiographic abnormality are seen. IMPRESSION: Nonobstructive bowel gas pattern. Mild to moderate volume stool throughout the colon. Electronically Signed   By: Duanne Guess D.O.   On: 06/01/2020 08:48     MR TIBIA FIBULA LEFT W WO CONTRAST    Result Date: 06/05/2020  CLINICAL DATA:  Left lower extremity pain and swelling. EXAM: MRI OF LOWER LEFT EXTREMITY WITHOUT AND WITH CONTRAST TECHNIQUE: Multiplanar, multisequence MR imaging  of the left lower extremity was performed both before and after administration of intravenous contrast. CONTRAST:  10mL GADAVIST GADOBUTROL 1 MMOL/ML IV SOLN COMPARISON:  06/01/2020 FINDINGS: Examination is limited by patient motion. Diffuse and marked subcutaneous soft tissue swelling/edema/fluid similar to prior study. There are numerous skin lesions, likely blisters. In the upper and mid mid tibia regions anteriorly and medially there are subcutaneous fluid collections which are ill-defined and not well organized. These could be a developing abscesses. No well-formed rim enhancing drainable abscess is identified. No MR findings for myofasciitis or pyomyositis. No evidence of osteomyelitis. No findings suspicious for septic arthritis. IMPRESSION: 1. Persistent diffuse and marked subcutaneous soft tissue swelling/edema/fluid similar to prior study. There are numerous skin lesions, likely blisters. 2. In the upper and mid tibia regions anteriorly and medially there are ill-defined subcutaneous fluid collections which are not well organized. These could be a developing abscesses. No well-formed rim enhancing drainable abscess is identified. 3. No MR findings for myofasciitis or pyomyositis. 4. No findings suspicious for septic arthritis or osteomyelitis.  Electronically Signed   By: Rudie Meyer M.D.   On: 06/05/2020 08:39     MR TIBIA FIBULA LEFT W WO CONTRAST    Result Date: 06/01/2020  CLINICAL DATA:  Suspect osteomyelitis, diffuse cellulitis EXAM: MRI OF LOWER LEFT EXTREMITY WITHOUT AND WITH CONTRAST TECHNIQUE: Multiplanar, multisequence MR imaging of the left was performed both before and after administration of intravenous contrast. CONTRAST:  10mL GADAVIST GADOBUTROL 1 MMOL/ML IV SOLN COMPARISON:  None. FINDINGS: Bones/Joint/Cartilage No areas of osseous destruction or periosteal reaction. Normal osseous marrow signal seen throughout. There tibial surfaces appear to be maintained. Ligaments Suboptimally visualized Muscles and Tendons The muscles surrounding the lower extremity are intact without focal atrophy or tear. The visualized portions of the tendons are intact. Soft tissues There is diffuse skin thickening with edema and subcutaneous edema extending to the deep fascial layers. There is non loculated fluid within the deep fascial layers. Overlying superficial epidermal cyst/boils are seen. IMPRESSION: Findings of diffuse cellulitis with superficial cyst. Non loculated fluid within the fascial layers. No evidence of osteomyelitis or abscess. Electronically Signed   By: Jonna Clark M.D.   On: 06/01/2020 20:54     US Venous Img Lower Unilateral Left    Result Date: 05/31/2020  CLINICAL DATA:  Edema and redness EXAM: LEFT LOWER EXTREMITY VENOUS DUPLEX ULTRASOUND TECHNIQUE: Gray-scale sonography with graded compression, as well as color Doppler and duplex ultrasound were performed to evaluate the left lower extremity deep venous system from the level of the common femoral vein and including the common femoral, femoral, profunda femoral, popliteal and calf veins including the posterior tibial, peroneal and gastrocnemius veins when visible. The superficial great saphenous vein was also interrogated. Spectral Doppler was utilized to evaluate flow at rest and  with distal augmentation maneuvers in the common femoral, femoral and popliteal veins. COMPARISON:  None. FINDINGS: Contralateral Common Femoral Vein: Respiratory phasicity is normal and symmetric with the symptomatic side. No evidence of thrombus. Normal compressibility. Common Femoral Vein: No evidence of thrombus. Normal compressibility, respiratory phasicity and response to augmentation. Saphenofemoral Junction: No evidence of thrombus. Normal compressibility and flow on color Doppler imaging. Profunda Femoral Vein: No evidence of thrombus. Normal compressibility and flow on color Doppler imaging. Femoral Vein: No evidence of thrombus. Normal compressibility, respiratory phasicity and response to augmentation. Popliteal Vein: No evidence of thrombus. Normal compressibility, respiratory phasicity and response to augmentation. Calf Veins: No evidence of thrombus. Normal compressibility and  flow on color Doppler imaging. Superficial Great Saphenous Vein: No evidence of thrombus. Normal compressibility. Venous Reflux:  None. Other Findings:  There is diffuse soft tissue edema. IMPRESSION: No evidence of deep venous thrombosis in the left lower extremity. Right common femoral vein also present. Diffuse left lower extremity soft tissue edema noted. Electronically Signed   By: Bretta Bang III M.D.   On: 05/31/2020 12:48     ECHOCARDIOGRAM COMPLETE    Result Date: 06/11/2020     ECHOCARDIOGRAM REPORT   Patient Name:   Thomas Browning Date of Exam: 06/10/2020 Medical Rec #:  [id number]      Height:       71.0 in Accession #:    [id number]     Weight:       300.0 lb Date of Birth:  [birth year]      BSA:          2.505 m Patient Age:    54 years       BP:           130/75 mmHg Patient Gender: M              HR:           86 bpm. Exam Location:  [facility] Procedure: 2D Echo, Color Doppler, Cardiac Doppler and Intracardiac            Opacification Agent Indications:     Edema  History:         Patient has no prior history of  Echocardiogram examinations.                  Risk Factors:Hypertension.  Sonographer:     Humphrey Rolls RDCS (AE) Referring Phys:  [phone] Skeet Simmer JR Diagnosing Phys: Adrian Blackwater MD  Sonographer Comments: Technically difficult study due to poor echo windows. IMPRESSIONS  1. Left ventricular ejection fraction, by estimation, is 45 to 50%. The left ventricle has mildly decreased function. The left ventricle has no regional wall motion abnormalities. Left ventricular diastolic parameters are consistent with Grade I diastolic dysfunction (impaired relaxation).  2. Right ventricular systolic function is normal. The right ventricular size is normal.  3. The mitral valve is normal in structure. Trivial mitral valve regurgitation. No evidence of mitral stenosis.  4. The aortic valve is normal in structure. Aortic valve regurgitation is not visualized. No aortic stenosis is present.  5. The inferior vena cava is normal in size with greater than 50% respiratory variability, suggesting right atrial pressure of 3 mmHg. FINDINGS  Left Ventricle: Left ventricular ejection fraction, by estimation, is 45 to 50%. The left ventricle has mildly decreased function. The left ventricle has no regional wall motion abnormalities. Definity contrast agent was given IV to delineate the left ventricular endocardial borders. The left ventricular internal cavity size was normal in size. There is no left ventricular hypertrophy. Left ventricular diastolic parameters are consistent with Grade I diastolic dysfunction (impaired relaxation). Right Ventricle: The right ventricular size is normal. No increase in right ventricular wall thickness. Right ventricular systolic function is normal. Left Atrium: Left atrial size was normal in size. Right Atrium: Right atrial size was normal in size. Pericardium: There is no evidence of pericardial effusion. Mitral Valve: The mitral valve is normal in structure. Trivial mitral valve regurgitation. No  evidence of mitral valve stenosis. MV peak gradient, 2.2 mmHg. The mean mitral valve gradient is 1.0 mmHg. Tricuspid Valve: The tricuspid valve is normal in structure. Tricuspid valve regurgitation  is trivial. No evidence of tricuspid stenosis. Aortic Valve: The aortic valve is normal in structure. Aortic valve regurgitation is not visualized. No aortic stenosis is present. Aortic valve mean gradient measures 5.0 mmHg. Aortic valve peak gradient measures 9.2 mmHg. Aortic valve area, by VTI measures 3.51 cm. Pulmonic Valve: The pulmonic valve was normal in structure. Pulmonic valve regurgitation is not visualized. No evidence of pulmonic stenosis. Aorta: The aortic root is normal in size and structure. Venous: The inferior vena cava is normal in size with greater than 50% respiratory variability, suggesting right atrial pressure of 3 mmHg. IAS/Shunts: No atrial level shunt detected by color flow Doppler.  LEFT VENTRICLE PLAX 2D LVIDd:         4.62 cm      Diastology LVIDs:         3.37 cm      LV e' medial:    8.27 cm/s LV PW:         1.13 cm      LV E/e' medial:  6.8 LV IVS:        1.03 cm      LV e' lateral:   9.03 cm/s LVOT diam:     2.40 cm      LV E/e' lateral: 6.2 LV SV:         73 LV SV Index:   29 LVOT Area:     4.52 cm  LV Volumes (MOD) LV vol d, MOD A2C: 106.0 ml LV vol d, MOD A4C: 125.0 ml LV vol s, MOD A2C: 55.9 ml LV vol s, MOD A4C: 63.8 ml LV SV MOD A2C:     50.1 ml LV SV MOD A4C:     125.0 ml LV SV MOD BP:      57.1 ml LEFT ATRIUM             Index LA diam:        2.70 cm 1.08 cm/m LA Vol (A2C):   28.1 ml 11.22 ml/m LA Vol (A4C):   15.6 ml 6.23 ml/m LA Biplane Vol: 21.1 ml 8.42 ml/m  AORTIC VALVE                   PULMONIC VALVE AV Area (Vmax):    3.39 cm    PV Vmax:       1.19 m/s AV Area (Vmean):   3.19 cm    PV Vmean:      79.300 cm/s AV Area (VTI):     3.51 cm    PV VTI:        0.183 m AV Vmax:           152.00 cm/s PV Peak grad:  5.7 mmHg AV Vmean:          99.600 cm/s PV Mean grad:  3.0  mmHg AV VTI:            0.209 m AV Peak Grad:      9.2 mmHg AV Mean Grad:      5.0 mmHg LVOT Vmax:         114.00 cm/s LVOT Vmean:        70.200 cm/s LVOT VTI:          0.162 m LVOT/AV VTI ratio: 0.78  AORTA Ao Root diam: 3.50 cm MITRAL VALVE MV Area (PHT): 3.48 cm    SHUNTS MV Peak grad:  2.2 mmHg    Systemic VTI:  0.16 m MV Mean grad:  1.0 mmHg  Systemic Diam: 2.40 cm MV Vmax:       0.75 m/s MV Vmean:      56.0 cm/s MV Decel Time: 218 msec MV E velocity: 56.30 cm/s MV A velocity: 61.10 cm/s MV E/A ratio:  0.92 Adrian Blackwater MD Electronically signed by Adrian Blackwater MD Signature Date/Time: 06/11/2020/1:10:09 PM    Final      Microbiology:  No results found for this or any previous visit (from the past 240 hour(s)).     Labs:  Basic Metabolic Panel:  Recent Labs   Lab 06/10/20  0505 06/10/20  0505 06/11/20  1610 06/12/20  0421 06/13/20  0652 06/14/20  0440 06/15/20  0507   NA 130*   < > 129* 127* 131* 134* 136   K 4.1   < > 3.5 3.2* 3.2* 4.0 4.0   CL 98   < > 93* 89* 97* 99 102   CO2 25   < > 25 25 25 27 25    GLUCOSE 135*   < > 113* 119* 113* 115* 102*   BUN 18   < > 19 22* 22* 17 16   CREATININE 1.15   < > 1.47* 1.62* 1.34* 1.29* 1.22   CALCIUM 8.4*   < > 8.9 9.1 8.5* 9.1 8.7*   MG 2.4   < > 2.2 2.2 2.3 2.2 2.0   PHOS 3.9  --   --  4.3 3.1 3.2 3.8    < > = values in this interval not displayed.     Liver Function Tests:  Recent Labs   Lab 06/10/20  0505 06/12/20  0421 06/13/20  0652 06/14/20  0440 06/15/20  0507   AST 27 25 21 23 21    ALT 33 27 20 19 15    ALKPHOS 61 67 63 65 63   BILITOT 0.7 0.9 0.5 0.6 0.6   PROT 8.4* 9.0* 8.2* 8.6* 8.2*   ALBUMIN 2.7* 3.1* 2.9* 2.9* 2.8*     No results for input(s): LIPASE, AMYLASE in the last 168 hours.  No results for input(s): AMMONIA in the last 168 hours.  CBC:  Recent Labs   Lab 06/10/20  0505 06/10/20  0505 06/11/20  9604 06/12/20  0421 06/13/20  0652 06/14/20  0440 06/15/20  0507   WBC 12.4*   < > 11.2* 11.9* 9.3 8.8 6.9   NEUTROABS 8.2*  --   --  7.3 6.0 5.2 3.8   HGB  12.5*   < > 13.1 12.9* 12.0* 11.8* 11.5*   HCT 37.7*   < > 39.3 38.4* 35.8* 35.6* 34.8*   MCV 87.3   < > 86.2 86.5 87.7 88.3 88.5   PLT 716*   < > 734* 804* 707* 705* 646*    < > = values in this interval not displayed.     Cardiac Enzymes:  No results for input(s): CKTOTAL, CKMB, CKMBINDEX, TROPONINI in the last 168 hours.  BNP:  BNP (last 3 results)  No results for input(s): BNP in the last 8760 hours.    ProBNP (last 3 results)  No results for input(s): PROBNP in the last 8760 hours.    CBG:  No results for input(s): GLUCAP in the last 168 hours.    Signed:    Lacretia Nicks MD.   [facility]  06/15/2020, 10:49 AM      Electronically signed by Zigmund Daniel., MD at 06/15/2020 10:50 AM EST

## 2020-06-24 ENCOUNTER — Ambulatory Visit: Payer: Self-pay | Admitting: Internal Medicine

## 2020-06-24 NOTE — Progress Notes (Deleted)
   New Outpatient Visit Date: 06/24/2020  Referring Provider: Zigmund Daniel., MD [address] [place],  [place] [address]  Chief Complaint: ***  HPI:  Mr. Kadlec is a 54 y.o. male who is being seen today for the evaluation of heart failure at the request of Dr. Lowell Guitar. He has a history of ***. ***  --------------------------------------------------------------------------------------------------  Cardiovascular History & Procedures: Cardiovascular Problems:  ***  Risk Factors:  ***  Cath/PCI:  ***  CV Surgery:  ***  EP Procedures and Devices:  ***  Non-Invasive Evaluation(s):  ***  Recent CV Pertinent Labs: Lab Results  Component Value Date   INR 1.3 (H) 05/31/2020   K 4.0 06/15/2020   MG 2.0 06/15/2020   BUN 16 06/15/2020   CREATININE 1.22 06/15/2020    --------------------------------------------------------------------------------------------------  Past Medical History:  Diagnosis Date  . Hypertension     Past Surgical History:  Procedure Laterality Date  . TONSILLECTOMY      No outpatient medications have been marked as taking for the 06/24/20 encounter (Appointment) with Lauris Serviss, Cristal Deer, MD.    Allergies: Patient has no known allergies.  Social History   Tobacco Use  . Smoking status: Never Smoker  . Smokeless tobacco: Never Used  Vaping Use  . Vaping Use: Never used  Substance Use Topics  . Alcohol use: Yes  . Drug use: No    Family History  Problem Relation Age of Onset  . Hypertension Mother     Review of Systems: A 12-system review of systems was performed and was negative except as noted in the HPI.  --------------------------------------------------------------------------------------------------  Physical Exam: There were no vitals taken for this visit.  General:  *** HEENT: No conjunctival pallor or scleral icterus. Facemask in place. Neck: Supple without lymphadenopathy, thyromegaly, JVD, or HJR.  No carotid bruit. Lungs: Normal work of breathing. Clear to auscultation bilaterally without wheezes or crackles. Heart: Regular rate and rhythm without murmurs, rubs, or gallops. Non-displaced PMI. Abd: Bowel sounds present. Soft, NT/ND without hepatosplenomegaly Ext: No lower extremity edema. Radial, PT, and DP pulses are 2+ bilaterally Skin: Warm and dry without rash. Neuro: CNIII-XII intact. Strength and fine-touch sensation intact in upper and lower extremities bilaterally. Psych: Normal mood and affect.  EKG:  ***  Lab Results  Component Value Date   WBC 6.9 06/15/2020   HGB 11.5 (L) 06/15/2020   HCT 34.8 (L) 06/15/2020   MCV 88.5 06/15/2020   PLT 646 (H) 06/15/2020    Lab Results  Component Value Date   NA 136 06/15/2020   K 4.0 06/15/2020   CL 102 06/15/2020   CO2 25 06/15/2020   BUN 16 06/15/2020   CREATININE 1.22 06/15/2020   GLUCOSE 102 (H) 06/15/2020   ALT 15 06/15/2020    No results found for: CHOL, HDL, LDLCALC, LDLDIRECT, TRIG, CHOLHDL   --------------------------------------------------------------------------------------------------  ASSESSMENT AND PLAN: ***  Yvonne Kendall, MD 06/24/2020 7:30 AM

## 2020-06-25 ENCOUNTER — Encounter: Payer: Self-pay | Admitting: Internal Medicine

## 2020-06-28 ENCOUNTER — Other Ambulatory Visit (INDEPENDENT_AMBULATORY_CARE_PROVIDER_SITE_OTHER): Payer: Self-pay | Admitting: Vascular Surgery

## 2020-06-28 DIAGNOSIS — I83229 Varicose veins of left lower extremity with both ulcer of unspecified site and inflammation: Secondary | ICD-10-CM

## 2020-06-28 DIAGNOSIS — L03116 Cellulitis of left lower limb: Secondary | ICD-10-CM

## 2020-06-28 DIAGNOSIS — L97929 Non-pressure chronic ulcer of unspecified part of left lower leg with unspecified severity: Secondary | ICD-10-CM

## 2020-06-28 DIAGNOSIS — S81802A Unspecified open wound, left lower leg, initial encounter: Secondary | ICD-10-CM

## 2020-06-29 ENCOUNTER — Ambulatory Visit: Payer: Self-pay | Attending: Infectious Diseases | Admitting: Infectious Diseases

## 2020-06-29 ENCOUNTER — Encounter: Payer: Self-pay | Admitting: Infectious Diseases

## 2020-06-29 VITALS — BP 154/95 | HR 85 | Temp 97.9°F | Resp 17 | Ht 71.0 in | Wt 342.0 lb

## 2020-06-29 DIAGNOSIS — R609 Edema, unspecified: Secondary | ICD-10-CM | POA: Insufficient documentation

## 2020-06-29 DIAGNOSIS — L03116 Cellulitis of left lower limb: Secondary | ICD-10-CM

## 2020-06-29 DIAGNOSIS — I10 Essential (primary) hypertension: Secondary | ICD-10-CM | POA: Insufficient documentation

## 2020-06-29 DIAGNOSIS — Z79899 Other long term (current) drug therapy: Secondary | ICD-10-CM | POA: Insufficient documentation

## 2020-06-29 DIAGNOSIS — Z7901 Long term (current) use of anticoagulants: Secondary | ICD-10-CM | POA: Insufficient documentation

## 2020-06-29 NOTE — Patient Instructions (Addendum)
You are here for follow up after a recent hospitalization for cellulitis of the left leg The celllulitis has healed and your leg is now dry and swollen Moisturizer and compression stocking is needed. You have an appt with vascular tomorrow- keep that. Try to lose weight to help with the leg swelling and High BP  Exercising to Lose Weight Exercise is structured, repetitive physical activity to improve fitness and health. Getting regular exercise is important for everyone. It is especially important if you are overweight. Being overweight increases your risk of heart disease, stroke, diabetes, high blood pressure, and several types of cancer. Reducing your calorie intake and exercising can help you lose weight. Exercise is usually categorized as moderate or vigorous intensity. To lose weight, most people need to do a certain amount of moderate-intensity or vigorous-intensity exercise each week. Moderate-intensity exercise  Moderate-intensity exercise is any activity that gets you moving enough to burn at least three times more energy (calories) than if you were sitting. Examples of moderate exercise include:  Walking a mile in 15 minutes.  Doing light yard work.  Biking at an easy pace. Most people should get at least 150 minutes (2 hours and 30 minutes) a week of moderate-intensity exercise to maintain their body weight. Vigorous-intensity exercise Vigorous-intensity exercise is any activity that gets you moving enough to burn at least six times more calories than if you were sitting. When you exercise at this intensity, you should be working hard enough that you are not able to carry on a conversation. Examples of vigorous exercise include:  Running.  Playing a team sport, such as football, basketball, and soccer.  Jumping rope. Most people should get at least 75 minutes (1 hour and 15 minutes) a week of vigorous-intensity exercise to maintain their body weight. How can exercise affect  me? When you exercise enough to burn more calories than you eat, you lose weight. Exercise also reduces body fat and builds muscle. The more muscle you have, the more calories you burn. Exercise also:  Improves mood.  Reduces stress and tension.  Improves your overall fitness, flexibility, and endurance.  Increases bone strength. The amount of exercise you need to lose weight depends on:  Your age.  The type of exercise.  Any health conditions you have.  Your overall physical ability. Talk to your health care provider about how much exercise you need and what types of activities are safe for you. What actions can I take to lose weight? Nutrition   Make changes to your diet as told by your health care provider or diet and nutrition specialist (dietitian). This may include: ? Eating fewer calories. ? Eating more protein. ? Eating less unhealthy fats. ? Eating a diet that includes fresh fruits and vegetables, whole grains, low-fat dairy products, and lean protein. ? Avoiding foods with added fat, salt, and sugar.  Drink plenty of water while you exercise to prevent dehydration or heat stroke. Activity  Choose an activity that you enjoy and set realistic goals. Your health care provider can help you make an exercise plan that works for you.  Exercise at a moderate or vigorous intensity most days of the week. ? The intensity of exercise may vary from person to person. You can tell how intense a workout is for you by paying attention to your breathing and heartbeat. Most people will notice their breathing and heartbeat get faster with more intense exercise.  Do resistance training twice each week, such as: ? Push-ups. ? Sit-ups. ?  Lifting weights. ? Using resistance bands.  Getting short amounts of exercise can be just as helpful as long structured periods of exercise. If you have trouble finding time to exercise, try to include exercise in your daily routine. ? Get up,  stretch, and walk around every 30 minutes throughout the day. ? Go for a walk during your lunch break. ? Park your car farther away from your destination. ? If you take public transportation, get off one stop early and walk the rest of the way. ? Make phone calls while standing up and walking around. ? Take the stairs instead of elevators or escalators.  Wear comfortable clothes and shoes with good support.  Do not exercise so much that you hurt yourself, feel dizzy, or get very short of breath. Where to find more information  [place].S[place] Department of Health and Human Services: ThisPath.fi  Centers for Disease Control and Prevention (CDC): FootballExhibition.com.br Contact a health care provider:  Before starting a new exercise program.  If you have questions or concerns about your weight.  If you have a medical problem that keeps you from exercising. Get help right away if you have any of the following while exercising:  Injury.  Dizziness.  Difficulty breathing or shortness of breath that does not go away when you stop exercising.  Chest pain.  Rapid heartbeat. Summary  Being overweight increases your risk of heart disease, stroke, diabetes, high blood pressure, and several types of cancer.  Losing weight happens when you burn more calories than you eat.  Reducing the amount of calories you eat in addition to getting regular moderate or vigorous exercise each week helps you lose weight. This information is not intended to replace advice given to you by your health care provider. Make sure you discuss any questions you have with your health care provider. Document Revised: 08/06/2017 Document Reviewed: 08/06/2017 Elsevier Patient Education  2020 [employer]  Calorie Counting for [employer] Calories are units of energy. Your body needs a certain amount of calories from food to keep you going throughout the day. When you eat more calories than your body needs, your body stores the extra  calories as fat. When you eat fewer calories than your body needs, your body burns fat to get the energy it needs. Calorie counting means keeping track of how many calories you eat and drink each day. Calorie counting can be helpful if you need to lose weight. If you make sure to eat fewer calories than your body needs, you should lose weight. Ask your health care provider what a healthy weight is for you. For calorie counting to work, you will need to eat the right number of calories in a day in order to lose a healthy amount of weight per week. A dietitian can help you determine how many calories you need in a day and will give you suggestions on how to reach your calorie goal.  A healthy amount of weight to lose per week is usually 1-2 lb (0.5-0.9 kg). This usually means that your daily calorie intake should be reduced by 500-750 calories.  Eating 1,200 - 1,500 calories per day can help most women lose weight.  Eating 1,500 - 1,800 calories per day can help most men lose weight. What is my plan? My goal is to have __________ calories per day. If I have this many calories per day, I should lose around __________ pounds per week. What do I need to know about calorie counting? In order  to meet your daily calorie goal, you will need to:  Find out how many calories are in each food you would like to eat. Try to do this before you eat.  Decide how much of the food you plan to eat.  Write down what you ate and how many calories it had. Doing this is called keeping a food log. To successfully lose weight, it is important to balance calorie counting with a healthy lifestyle that includes regular activity. Aim for 150 minutes of moderate exercise (such as walking) or 75 minutes of vigorous exercise (such as running) each week. Where do I find calorie information?  The number of calories in a food can be found on a Nutrition Facts label. If a food does not have a Nutrition Facts label, try to look up  the calories online or ask your dietitian for help. Remember that calories are listed per serving. If you choose to have more than one serving of a food, you will have to multiply the calories per serving by the amount of servings you plan to eat. For example, the label on a package of bread might say that a serving size is 1 slice and that there are 90 calories in a serving. If you eat 1 slice, you will have eaten 90 calories. If you eat 2 slices, you will have eaten 180 calories. How do I keep a food log? Immediately after each meal, record the following information in your food log:  What you ate. Don't forget to include toppings, sauces, and other extras on the food.  How much you ate. This can be measured in cups, ounces, or number of items.  How many calories each food and drink had.  The total number of calories in the meal. Keep your food log near you, such as in a small notebook in your pocket, or use a mobile app or website. Some programs will calculate calories for you and show you how many calories you have left for the day to meet your goal. What are some calorie counting tips?   Use your calories on foods and drinks that will fill you up and not leave you hungry: ? Some examples of foods that fill you up are nuts and nut butters, vegetables, lean proteins, and high-fiber foods like whole grains. High-fiber foods are foods with more than 5 g fiber per serving. ? Drinks such as sodas, specialty coffee drinks, alcohol, and juices have a lot of calories, yet do not fill you up.  Eat nutritious foods and avoid empty calories. Empty calories are calories you get from foods or beverages that do not have many vitamins or protein, such as candy, sweets, and soda. It is better to have a nutritious high-calorie food (such as an avocado) than a food with few nutrients (such as a bag of chips).  Know how many calories are in the foods you eat most often. This will help you calculate calorie  counts faster.  Pay attention to calories in drinks. Low-calorie drinks include water and unsweetened drinks.  Pay attention to nutrition labels for "low fat" or "fat free" foods. These foods sometimes have the same amount of calories or more calories than the full fat versions. They also often have added sugar, starch, or salt, to make up for flavor that was removed with the fat.  Find a way of tracking calories that works for you. Get creative. Try different apps or programs if writing down calories does not work for  you. What are some portion control tips?  Know how many calories are in a serving. This will help you know how many servings of a certain food you can have.  Use a measuring cup to measure serving sizes. You could also try weighing out portions on a kitchen scale. With time, you will be able to estimate serving sizes for some foods.  Take some time to put servings of different foods on your favorite plates, bowls, and cups so you know what a serving looks like.  Try not to eat straight from a bag or box. Doing this can lead to overeating. Put the amount you would like to eat in a cup or on a plate to make sure you are eating the right portion.  Use smaller plates, glasses, and bowls to prevent overeating.  Try not to multitask (for example, watch TV or use your computer) while eating. If it is time to eat, sit down at a table and enjoy your food. This will help you to know when you are full. It will also help you to be aware of what you are eating and how much you are eating. What are tips for following this plan? Reading food labels  Check the calorie count compared to the serving size. The serving size may be smaller than what you are used to eating.  Check the source of the calories. Make sure the food you are eating is high in vitamins and protein and low in saturated and trans fats. Shopping  Read nutrition labels while you shop. This will help you make healthy  decisions before you decide to purchase your food.  Make a grocery list and stick to it. Cooking  Try to cook your favorite foods in a healthier way. For example, try baking instead of frying.  Use low-fat dairy products. Meal planning  Use more fruits and vegetables. Half of your plate should be fruits and vegetables.  Include lean proteins like poultry and fish. How do I count calories when eating out?  Ask for smaller portion sizes.  Consider sharing an entree and sides instead of getting your own entree.  If you get your own entree, eat only half. Ask for a box at the beginning of your meal and put the rest of your entree in it so you are not tempted to eat it.  If calories are listed on the menu, choose the lower calorie options.  Choose dishes that include vegetables, fruits, whole grains, low-fat dairy products, and lean protein.  Choose items that are boiled, broiled, grilled, or steamed. Stay away from items that are buttered, battered, fried, or served with cream sauce. Items labeled "crispy" are usually fried, unless stated otherwise.  Choose water, low-fat milk, unsweetened iced tea, or other drinks without added sugar. If you want an alcoholic beverage, choose a lower calorie option such as a glass of wine or light beer.  Ask for dressings, sauces, and syrups on the side. These are usually high in calories, so you should limit the amount you eat.  If you want a salad, choose a garden salad and ask for grilled meats. Avoid extra toppings like bacon, cheese, or fried items. Ask for the dressing on the side, or ask for olive oil and vinegar or lemon to use as dressing.  Estimate how many servings of a food you are given. For example, a serving of cooked rice is  cup or about the size of half a baseball. Knowing serving sizes will  help you be aware of how much food you are eating at restaurants. The list below tells you how big or small some common portion sizes are based  on everyday objects: ? 1 oz--4 stacked dice. ? 3 oz--1 deck of cards. ? 1 tsp--1 die. ? 1 Tbsp-- a ping-pong ball. ? 2 Tbsp--1 ping-pong ball. ?  cup-- baseball. ? 1 cup--1 baseball. Summary  Calorie counting means keeping track of how many calories you eat and drink each day. If you eat fewer calories than your body needs, you should lose weight.  A healthy amount of weight to lose per week is usually 1-2 lb (0.5-0.9 kg). This usually means reducing your daily calorie intake by 500-750 calories.  The number of calories in a food can be found on a Nutrition Facts label. If a food does not have a Nutrition Facts label, try to look up the calories online or ask your dietitian for help.  Use your calories on foods and drinks that will fill you up, and not on foods and drinks that will leave you hungry.  Use smaller plates, glasses, and bowls to prevent overeating. This information is not intended to replace advice given to you by your health care provider. Make sure you discuss any questions you have with your health care provider. Document Revised: 04/12/2018 Document Reviewed: 06/23/2016 Elsevier Patient Education  2020 [employer]

## 2020-06-29 NOTE — Progress Notes (Signed)
NAME: Stephen [name]  DOB: [birth year]  MRN: [id number]  Date/Time: 06/29/2020 9:36 AM   Subjective:   ?Follow-up after recent hospitalization for a bad case of left leg cellulitis. Stephen [name] is a 54 y.o. male was recently in [facility] between 10/25-11/9 with fever and left leg swelling and blistering. 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  His legs were swollen left more than right.  And then he came to the hospital because of erythema pain fever.  He had a severe case of left leg cellulitis with blistering.  He was treated with 2 weeks of IV antibiotics which initially was broad-spectrum and later was cefazolin and local wound care as well as elevating the legs.  He is doing much better No fever or chills The leg has healed well Past Medical History:  Diagnosis Date  . Hypertension     Past Surgical History:  Procedure Laterality Date  . TONSILLECTOMY      Social History   Socioeconomic History  . Marital status: Single    Spouse name: Not on file  . Number of children: Not on file  . Years of education: Not on file  . Highest education level: Not on file  Occupational History  . Occupation: Truck [employer] doctor  Tobacco Use  . Smoking status: Never Smoker  . Smokeless tobacco: Never Used  Vaping Use  . Vaping Use: Never used  Substance and Sexual Activity  . Alcohol use: Yes  . Drug use: No  . Sexual activity: Not on file  Other Topics Concern  . Not on file  Social History Narrative  . Not on file   Social Determinants of Health   Financial Resource Strain:   . Difficulty of Paying Living Expenses: Not on file  Food Insecurity:   . Worried About Programme researcher, broadcasting/film/video in the Last Year: Not on file  . Ran Out of Food in the Last Year: Not on file  Transportation Needs:   . Lack of Transportation (Medical): Not on file  . Lack of Transportation (Non-Medical): Not on file  Physical Activity:   . Days of Exercise per  Week: Not on file  . Minutes of Exercise per Session: Not on file  Stress:   . Feeling of Stress : Not on file  Social Connections:   . Frequency of Communication with Friends and Family: Not on file  . Frequency of Social Gatherings with Friends and Family: Not on file  . Attends Religious Services: Not on file  . Active Member of Clubs or Organizations: Not on file  . Attends Banker Meetings: Not on file  . Marital Status: Not on file  Intimate Partner Violence:   . Fear of Current or Ex-Partner: Not on file  . Emotionally Abused: Not on file  . Physically Abused: Not on file  . Sexually Abused: Not on file    Family History  Problem Relation Age of Onset  . Hypertension Mother    No Known Allergies  ? Current Outpatient Medications  Medication Sig Dispense Refill  . carvedilol (COREG) 3.125 MG tablet Take 1 tablet (3.125 mg total) by mouth 2 (two) times daily with a meal. 60 tablet 0  . cephALEXin (KEFLEX) 500 MG capsule Take 1 capsule (500 mg total) by mouth 4 (four) times daily for 14 days. 56 capsule 0  . furosemide (LASIX) 20 MG tablet Take 1 tablet (20 mg total)  by mouth daily. 30 tablet 0  . hydrocerin (EUCERIN) CREA Apply 1 application topically daily. 120 g 0  . losartan (COZAAR) 25 MG tablet Take 1 tablet (25 mg total) by mouth daily. 30 tablet 0  . polyethylene glycol (MIRALAX / GLYCOLAX) 17 g packet Take 17 g by mouth daily. 14 each 0   No current facility-administered medications for this visit.     Abtx:  Anti-infectives (From admission, onward)   None      REVIEW OF SYSTEMS:  Const: negative fever, negative chills, negative weight loss Eyes: negative diplopia or visual changes, negative eye pain ENT: negative coryza, negative sore throat Resp: negative cough, hemoptysis, dyspnea Cards: negative for chest pain, palpitations, lower extremity edema GU: negative for frequency, dysuria and hematuria GI: Negative for abdominal pain, diarrhea,  bleeding, constipation Skin: negative for rash and pruritus Heme: negative for easy bruising and gum/nose bleeding MS: negative for myalgias, arthralgias, back pain and muscle weakness Neurolo:negative for headaches, dizziness, vertigo, memory problems  Psych: negative for feelings of anxiety, depression  Endocrine: negative for thyroid, diabetes Allergy/Immunology- negative for any medication or food allergies ? Pertinent Positives include : Objective:  VITALS:  BP (!) 154/95   Pulse 85   Temp 97.9 F (36.6 C) (Temporal)   Resp 17   Ht 5\' 11"  (1.803 m)   Wt (!) 342 lb (155.1 kg)   SpO2 95%   BMI 47.70 kg/m  PHYSICAL EXAM:  General: Alert, cooperative, no distress, appears stated age. obese Head: Normocephalic, without obvious abnormality, atraumatic. Eyes: Conjunctivae clear, anicteric sclerae. Pupils are equal ENT Nares normal. No drainage or sinus tenderness. Lips, mucosa, and tongue normal. No Thrush Neck: Supple, symmetrical, no adenopathy, thyroid: non tender no carotid bruit and no JVD. Back: No CVA tenderness. Lungs: Clear to auscultation bilaterally. No Wheezing or Rhonchi. No rales. Heart: Regular rate and rhythm, no murmur, rub or gallop. Abdomen: Soft, non-tender,not distended. Bowel sounds normal. No masses Extremities: left leg swollen- skin dry, all blisters and superfical wounds have healed 06/29/20    06/14/20    06/11/20    Skin: No rashes or lesions. Or bruising Lymph: Cervical, supraclavicular normal. Neurologic: Grossly non-focal Pertinent Labs Lab Results CBC    Component Value Date/Time   WBC 6.9 06/15/2020 0507   RBC 3.93 (L) 06/15/2020 0507   HGB 11.5 (L) 06/15/2020 0507   HCT 34.8 (L) 06/15/2020 0507   PLT 646 (H) 06/15/2020 0507   MCV 88.5 06/15/2020 0507   MCH 29.3 06/15/2020 0507   MCHC 33.0 06/15/2020 0507   RDW 13.4 06/15/2020 0507   LYMPHSABS 1.9 06/15/2020 0507   MONOABS 0.9 06/15/2020 0507   EOSABS 0.2 06/15/2020 0507     BASOSABS 0.1 06/15/2020 0507    CMP Latest Ref Rng & Units 06/15/2020 06/14/2020 06/13/2020  Glucose 70 - 99 mg/dL 13/02/2020) 175(Z) 025(E)  BUN 6 - 20 mg/dL 16 17 527(P)  Creatinine 0.61 - 1.24 mg/dL 82(U 2.35) 3.61(W)  Sodium 135 - 145 mmol/L 136 134(L) 131(L)  Potassium 3.5 - 5.1 mmol/L 4.0 4.0 3.2(L)  Chloride 98 - 111 mmol/L 102 99 97(L)  CO2 22 - 32 mmol/L 25 27 25   Calcium 8.9 - 10.3 mg/dL 4.31(V) 9.1 )  Total Protein 6.5 - 8.1 g/dL 8.2(H) 8.6(H) 8.2(H)  Total Bilirubin 0.3 - 1.2 mg/dL 0.6 0.6 0.5  Alkaline Phos 38 - 126 U/L 63 65 63  AST 15 - 41 U/L 21 23 21   ALT 0 - 44 U/L 15  19 20  ? Impression/Recommendation ?He is cellulitis of the left leg with severe edema.  Has received almost 4 weeks of antibiotics the initial 2 weeks was IV and now p.o. Keflex.  The cellulitis is completely resolved.  The edema is much improved but still the leg is swollen.  He needs to wear compression stocking.  He has an appointment with vascular tomorrow for ABI Patient can stop the Keflex now.  Hypertension on furosemide Coreg and losartan.  He is still does not have a primary care provider and [facility]  Discussed weight management and exercise.  Discussed calorie control and losing weight in order to prevent future cellulitis of the legs. ? Follow-up as needed ___________________________________________________ Note:  This document was prepared using Dragon voice recognition software and may include unintentional dictation errors.

## 2020-06-29 NOTE — Progress Notes (Signed)
Formatting of this note is different from the original.  Images from the original note were not included.  NAME: Thomas Browning   DOB: [birth year]   MRN: [id number]   Date/Time: 06/29/2020 9:36 AM    Subjective:     ?Follow-up after recent hospitalization for a bad case of left leg cellulitis.  Thomas Browning is a 54 y.o. male was recently in [facility] between  10/25-11/9 with fever and left leg swelling and blistering.  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  His legs were swollen left more than right.  And then he came to the hospital because of erythema pain fever.  He had a severe case of left leg cellulitis with blistering.  He was treated with 2 weeks of IV antibiotics which initially was broad-spectrum and later was cefazolin and local wound care as well as elevating the legs.   He is doing much better  No fever or chills  The leg has healed well  Past Medical History:   Diagnosis Date   ? Hypertension      Past Surgical History:   Procedure Laterality Date   ? TONSILLECTOMY       Social History     Socioeconomic History   ? Marital status: Single     Spouse name: Not on file   ? Number of children: Not on file   ? Years of education: Not on file   ? Highest education level: Not on file   Occupational History   ? Occupation: Truck [employer] doctor   Tobacco Use   ? Smoking status: Never Smoker   ? Smokeless tobacco: Never Used   Vaping Use   ? Vaping Use: Never used   Substance and Sexual Activity   ? Alcohol use: Yes   ? Drug use: No   ? Sexual activity: Not on file   Other Topics Concern   ? Not on file   Social History Narrative   ? Not on file     Social Determinants of Health     Financial Resource Strain:    ? Difficulty of Paying Living Expenses: Not on file   Food Insecurity:    ? Worried About Running Out of Food in the Last Year: Not on file   ? Ran Out of Food in the Last Year: Not on file   Transportation Needs:    ? Lack of Transportation (Medical): Not on  file   ? Lack of Transportation (Non-Medical): Not on file   Physical Activity:    ? Days of Exercise per Week: Not on file   ? Minutes of Exercise per Session: Not on file   Stress:    ? Feeling of Stress : Not on file   Social Connections:    ? Frequency of Communication with Friends and Family: Not on file   ? Frequency of Social Gatherings with Friends and Family: Not on file   ? Attends Religious Services: Not on file   ? Active Member of Clubs or Organizations: Not on file   ? Attends Banker Meetings: Not on file   ? Marital Status: Not on file   Intimate Partner Violence:    ? Fear of Current or Ex-Partner: Not on file   ? Emotionally Abused: Not on file   ? Physically Abused: Not on file   ? Sexually Abused: Not on file     Family History  Problem Relation Age of Onset   ? Hypertension Mother      No Known Allergies    ?  Current Outpatient Medications   Medication Sig Dispense Refill   ? carvedilol (COREG) 3.125 MG tablet Take 1 tablet (3.125 mg total) by mouth 2 (two) times daily with a meal. 60 tablet 0   ? cephALEXin (KEFLEX) 500 MG capsule Take 1 capsule (500 mg total) by mouth 4 (four) times daily for 14 days. 56 capsule 0   ? furosemide (LASIX) 20 MG tablet Take 1 tablet (20 mg total) by mouth daily. 30 tablet 0   ? hydrocerin (EUCERIN) CREA Apply 1 application topically daily. 120 g 0   ? losartan (COZAAR) 25 MG tablet Take 1 tablet (25 mg total) by mouth daily. 30 tablet 0   ? polyethylene glycol (MIRALAX / GLYCOLAX) 17 g packet Take 17 g by mouth daily. 14 each 0     No current facility-administered medications for this visit.       Abtx:   Anti-infectives (From admission, onward)    None       REVIEW OF SYSTEMS:   Const: negative fever, negative chills, negative weight loss  Eyes: negative diplopia or visual changes, negative eye pain  ENT: negative coryza, negative sore throat  Resp: negative cough, hemoptysis, dyspnea  Cards: negative for chest pain, palpitations, lower extremity  edema  GU: negative for frequency, dysuria and hematuria  GI: Negative for abdominal pain, diarrhea, bleeding, constipation  Skin: negative for rash and pruritus  Heme: negative for easy bruising and gum/nose bleeding  MS: negative for myalgias, arthralgias, back pain and muscle weakness  Neurolo:negative for headaches, dizziness, vertigo, memory problems   Psych: negative for feelings of anxiety, depression   Endocrine: negative for thyroid, diabetes  Allergy/Immunology- negative for any medication or food allergies  ?  Pertinent Positives include :  Objective:   VITALS:   BP (!) 154/95   Pulse 85   Temp 97.9 F (36.6 C) (Temporal)   Resp 17   Ht 5\' 11"  (1.803 m)   Wt (!) 342 lb (155.1 kg)   SpO2 95%   BMI 47.70 kg/m   PHYSICAL EXAM:   General: Alert, cooperative, no distress, appears stated age. obese  Head: Normocephalic, without obvious abnormality, atraumatic.  Eyes: Conjunctivae clear, anicteric sclerae. Pupils are equal  ENT Nares normal. No drainage or sinus tenderness.  Lips, mucosa, and tongue normal. No Thrush  Neck: Supple, symmetrical, no adenopathy, thyroid: non tender  no carotid bruit and no JVD.  Back: No CVA tenderness.  Lungs: Clear to auscultation bilaterally. No Wheezing or Rhonchi. No rales.  Heart: Regular rate and rhythm, no murmur, rub or gallop.  Abdomen: Soft, non-tender,not distended. Bowel sounds normal. No masses  Extremities: left leg swollen- skin dry, all blisters and superfical wounds have healed  06/29/20    06/14/20    06/11/20    Skin: No rashes or lesions. Or bruising  Lymph: Cervical, supraclavicular normal.  Neurologic: Grossly non-focal  Pertinent Labs  Lab Results  CBC    Component Value Date/Time    WBC 6.9 06/15/2020 0507    RBC 3.93 (L) 06/15/2020 0507    HGB 11.5 (L) 06/15/2020 0507    HCT 34.8 (L) 06/15/2020 0507    PLT 646 (H) 06/15/2020 0507    MCV 88.5 06/15/2020 0507    MCH 29.3 06/15/2020 0507    MCHC 33.0 06/15/2020 0507    RDW 13.4 06/15/2020 0507  LYMPHSABS 1.9 06/15/2020 0507    MONOABS 0.9 06/15/2020 0507    EOSABS 0.2 06/15/2020 0507    BASOSABS 0.1 06/15/2020 0507     CMP Latest Ref Rng & Units 06/15/2020 06/14/2020 06/13/2020   Glucose 70 - 99 mg/dL 409(W) 119(J) 478(G)   BUN 6 - 20 mg/dL 16 17 95(A)   Creatinine 0.61 - 1.24 mg/dL 2.13 0.86(V) 7.84(O)   Sodium 135 - 145 mmol/L 136 134(L) 131(L)   Potassium 3.5 - 5.1 mmol/L 4.0 4.0 3.2(L)   Chloride 98 - 111 mmol/L 102 99 97(L)   CO2 22 - 32 mmol/L 25 27 25    Calcium 8.9 - 10.3 mg/dL 9.6(E) 9.1 9.5(M)   Total Protein 6.5 - 8.1 g/dL 8.2(H) 8.6(H) 8.2(H)   Total Bilirubin 0.3 - 1.2 mg/dL 0.6 0.6 0.5   Alkaline Phos 38 - 126 U/L 63 65 63   AST 15 - 41 U/L 21 23 21    ALT 0 - 44 U/L 15 19 20    ?  Impression/Recommendation  ?He is cellulitis of the left leg with severe edema.  Has received almost 4 weeks of antibiotics the initial 2 weeks was IV and now p.o. Keflex.  The cellulitis is completely resolved.  The edema is much improved but still the leg is swollen.  He needs to wear compression stocking.  He has an appointment with vascular tomorrow for ABI  Patient can stop the Keflex now.    Hypertension on furosemide Coreg and losartan.  He is still does not have a primary care provider and [facility]    Discussed weight management and exercise.  Discussed calorie control and losing weight in order to prevent future cellulitis of the legs.  ?  Follow-up as needed  ___________________________________________________  Note:  This document was prepared using Dragon voice recognition software and may include unintentional dictation errors.                                      Electronically signed by Lynn Ito, MD at 06/29/2020  1:45 PM EST

## 2020-06-30 ENCOUNTER — Other Ambulatory Visit: Payer: Self-pay

## 2020-06-30 ENCOUNTER — Ambulatory Visit (INDEPENDENT_AMBULATORY_CARE_PROVIDER_SITE_OTHER): Payer: Self-pay | Admitting: Nurse Practitioner

## 2020-06-30 ENCOUNTER — Ambulatory Visit (INDEPENDENT_AMBULATORY_CARE_PROVIDER_SITE_OTHER): Payer: Self-pay

## 2020-06-30 ENCOUNTER — Encounter (INDEPENDENT_AMBULATORY_CARE_PROVIDER_SITE_OTHER): Payer: Self-pay | Admitting: Nurse Practitioner

## 2020-06-30 VITALS — BP 184/132 | HR 86 | Ht 71.0 in | Wt 337.0 lb

## 2020-06-30 DIAGNOSIS — S81802A Unspecified open wound, left lower leg, initial encounter: Secondary | ICD-10-CM

## 2020-06-30 DIAGNOSIS — I83229 Varicose veins of left lower extremity with both ulcer of unspecified site and inflammation: Secondary | ICD-10-CM

## 2020-06-30 DIAGNOSIS — L03116 Cellulitis of left lower limb: Secondary | ICD-10-CM

## 2020-06-30 DIAGNOSIS — I10 Essential (primary) hypertension: Secondary | ICD-10-CM

## 2020-06-30 DIAGNOSIS — M7989 Other specified soft tissue disorders: Secondary | ICD-10-CM

## 2020-06-30 DIAGNOSIS — L97929 Non-pressure chronic ulcer of unspecified part of left lower leg with unspecified severity: Secondary | ICD-10-CM

## 2020-07-08 ENCOUNTER — Encounter (INDEPENDENT_AMBULATORY_CARE_PROVIDER_SITE_OTHER): Payer: Self-pay | Admitting: Nurse Practitioner

## 2020-07-08 NOTE — Progress Notes (Signed)
Subjective:    Patient ID: Stephen [name], male    DOB: 1966-01-02, 54 y.o.   MRN: [id number] Chief Complaint  Patient presents with  . Follow-up    1 wk [facility] Abi VLE vn reflux     Stephen [name] is a 54 year old male that presents today for follow-up of left leg cellulitis and wound.  On 05/31/2020 the patient presented to [facility] with fever, redness and a wound of the left lower extremity.  The patient also had extensive swelling.  The patient works as a Agricultural consultant and was subsequently found to have cellulitis.  Since that time the patient has been elevating his lower extremities in addition to wearing compression to help with the edema.  The previous wound is healed.  The patient also had 2 weeks of IV antibiotics for treatment.  Today, he notes that his leg feels much better and he still continues to have swelling but it is less significant than previously.  He denies any recent fever, chills, nausea, vomiting or diarrhea.  He denies chest pain or shortness of breath.  Today noninvasive study showed no evidence of DVT or superficial venous thrombosis bilaterally.  No evidence of superficial venous reflux seen in the bilateral great for short saphenous veins.  No evidence of deep venous insufficiency in the right lower extremity.  The left lower extremity has deep venous insufficiency in the common femoral vein.  It is also noted that there are swollen lymph nodes in the left groin area.  Today the patient has a ABI of 1.16 on the right and 1.19 on the left.  He has triphasic tibial artery waveforms in the right with triphasic/biphasic in the left.  He has normal toe waveforms bilaterally.   Review of Systems  Cardiovascular: Positive for leg swelling.  All other systems reviewed and are negative.      Objective:   Physical Exam Vitals reviewed.  HENT:     Head: Normocephalic.  Cardiovascular:     Rate and Rhythm: Normal rate and regular rhythm.      Pulses: Normal pulses.  Pulmonary:     Effort: Pulmonary effort is normal.  Musculoskeletal:     Right lower leg: 2+ Edema present.     Left lower leg: 2+ Edema present.  Skin:    General: Skin is warm and dry.  Neurological:     Mental Status: He is alert and oriented to person, place, and time.  Psychiatric:        Mood and Affect: Mood normal.        Behavior: Behavior normal.        Thought Content: Thought content normal.        Judgment: Judgment normal.     BP (!) 184/132   Pulse 86   Ht 5\' 11"  (1.803 m)   Wt (!) 337 lb (152.9 kg)   BMI 47.00 kg/m   Past Medical History:  Diagnosis Date  . Hypertension     Social History   Socioeconomic History  . Marital status: Single    Spouse name: Not on file  . Number of children: Not on file  . Years of education: Not on file  . Highest education level: Not on file  Occupational History  . Occupation: Truck  Tobacco Use  . Smoking status: Never Smoker  . Smokeless tobacco: Never Used  Vaping Use  . Vaping Use: Never used  Substance and Sexual Activity  . Alcohol  use: Yes  . Drug use: No  . Sexual activity: Not on file  Other Topics Concern  . Not on file  Social History Narrative  . Not on file   Social Determinants of Health   Financial Resource Strain:   . Difficulty of Paying Living Expenses: Not on file  Food Insecurity:   . Worried About Programme researcher, broadcasting/film/video in the Last Year: Not on file  . Ran Out of Food in the Last Year: Not on file  Transportation Needs:   . Lack of Transportation (Medical): Not on file  . Lack of Transportation (Non-Medical): Not on file  Physical Activity:   . Days of Exercise per Week: Not on file  . Minutes of Exercise per Session: Not on file  Stress:   . Feeling of Stress : Not on file  Social Connections:   . Frequency of Communication with Friends and Family: Not on file  . Frequency of Social Gatherings with Friends and Family: Not on file  . Attends  Religious Services: Not on file  . Active Member of Clubs or Organizations: Not on file  . Attends Banker Meetings: Not on file  . Marital Status: Not on file  Intimate Partner Violence:   . Fear of Current or Ex-Partner: Not on file  . Emotionally Abused: Not on file  . Physically Abused: Not on file  . Sexually Abused: Not on file    Past Surgical History:  Procedure Laterality Date  . TONSILLECTOMY      Family History  Problem Relation Age of Onset  . Hypertension Mother     No Known Allergies  CBC Latest Ref Rng & Units 06/15/2020 06/14/2020 06/13/2020  WBC 4.0 - 10.5 K/uL 6.9 8.8 9.3  Hemoglobin 13.0 - 17.0 g/dL 11.5(L) 11.8(L) 12.0(L)  Hematocrit 39 - 52 % 34.8(L) 35.6(L) 35.8(L)  Platelets 150 - 400 K/uL 646(H) 705(H) 707(H)      CMP     Component Value Date/Time   NA 136 06/15/2020 0507   K 4.0 06/15/2020 0507   CL 102 06/15/2020 0507   CO2 25 06/15/2020 0507   GLUCOSE 102 (H) 06/15/2020 0507   BUN 16 06/15/2020 0507   CREATININE 1.22 06/15/2020 0507   CALCIUM 8.7 (L) 06/15/2020 0507   PROT 8.2 (H) 06/15/2020 0507   ALBUMIN 2.8 (L) 06/15/2020 0507   AST 21 06/15/2020 0507   ALT 15 06/15/2020 0507   ALKPHOS 63 06/15/2020 0507   BILITOT 0.6 06/15/2020 0507   GFRNONAA >60 06/15/2020 0507     VAS [place] ABI WITH/WO TBI  Result Date: 07/05/2020 LOWER EXTREMITY DOPPLER STUDY Indications: Left leg edema/cellulitis.  Performing Technologist: Salvadore Farber RVT  Examination Guidelines: A complete evaluation includes at minimum, Doppler waveform signals and systolic blood pressure reading at the level of bilateral brachial, anterior tibial, and posterior tibial arteries, when vessel segments are accessible. Bilateral testing is considered an integral part of a complete examination. Photoelectric Plethysmograph (PPG) waveforms and toe systolic pressure readings are included as required and additional duplex testing as needed. Limited examinations for  reoccurring indications may be performed as noted.  ABI Findings: +---------+------------------+-----+---------+--------+ Right    Rt Pressure (mmHg)IndexWaveform Comment  +---------+------------------+-----+---------+--------+ Brachial 166                                      +---------+------------------+-----+---------+--------+ ATA      192  1.16 triphasic         +---------+------------------+-----+---------+--------+ PTA      193               1.16 triphasic         +---------+------------------+-----+---------+--------+ Great Toe198               1.19 Normal            +---------+------------------+-----+---------+--------+ +---------+------------------+-----+---------+-------+ Left     Lt Pressure (mmHg)IndexWaveform Comment +---------+------------------+-----+---------+-------+ ATA      187               1.13 triphasic        +---------+------------------+-----+---------+-------+ PTA      197               1.19 biphasic         +---------+------------------+-----+---------+-------+ Great Toe186               1.12 Normal           +---------+------------------+-----+---------+-------+ +-------+-----------+-----------+------------+------------+ ABI/TBIToday's ABIToday's TBIPrevious ABIPrevious TBI +-------+-----------+-----------+------------+------------+ Right  1.16       1.19                                +-------+-----------+-----------+------------+------------+ Left   1.19       1.12                                +-------+-----------+-----------+------------+------------+  Summary: Right: Resting right ankle-brachial index is within normal range. No evidence of significant right lower extremity arterial disease. The right toe-brachial index is normal. Left: Resting left ankle-brachial index is within normal range. No evidence of significant left lower extremity arterial disease. The left toe-brachial index is normal.   *See table(s) above for measurements and observations.  Electronically signed by Levora DredgeGregory Schnier MD on 07/05/2020 at 8:43:55 AM.   Final        Assessment & Plan:   1. Left leg cellulitis Cellulitis has resolved.  Discussed the role of swelling in regards to development of lymphedema.  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  2. Primary hypertension Continue antihypertensive medications as already ordered, these medications have been reviewed and there are no changes at this time.   3. Swelling of lower extremity I have had a long discussion with the patient regarding swelling and why it  causes symptoms.  Patient will begin wearing graduated compression stockings class 1 (20-30 mmHg) on a daily basis a prescription was given. The patient will  beginning wearing the stockings first thing in the morning and removing them in the evening. The patient is instructed specifically not to sleep in the stockings.   In addition, behavioral modification will be initiated.  This will include frequent elevation, use of over the counter pain medications and exercise such as walking.  I have reviewed systemic causes for chronic edema such as liver, kidney and cardiac etiologies.  The patient denies problems with these organ systems.    Consideration for a lymph pump will also be made based upon the effectiveness of conservative therapy.  This would help to improve the edema control and prevent sequela such as ulcers and infections   Patient will follow up in 3 months     Current Outpatient Medications on File Prior to Visit  Medication Sig  Dispense Refill  . carvedilol (COREG) 3.125 MG tablet Take 1 tablet (3.125 mg total) by mouth 2 (two) times daily with a meal. 60 tablet 0  . furosemide (LASIX) 20 MG tablet Take 1 tablet (20 mg total) by mouth daily. 30 tablet 0  . hydrocerin (EUCERIN) CREA Apply 1 application topically daily. 120 g 0  .  losartan (COZAAR) 25 MG tablet Take 1 tablet (25 mg total) by mouth daily. 30 tablet 0  . polyethylene glycol (MIRALAX / GLYCOLAX) 17 g packet Take 17 g by mouth daily. 14 each 0   No current facility-administered medications on file prior to visit.    There are no Patient Instructions on file for this visit. No follow-ups on file.   Georgiana Spinner, NP

## 2020-08-02 ENCOUNTER — Telehealth: Payer: Self-pay | Admitting: Pharmacy Technician

## 2020-08-02 NOTE — Telephone Encounter (Signed)
Patient failed to provide 2021 proof of income.  No additional medication assistance will be provided by [facility] without the required proof of income documentation.  Patient notified by letter.  [name] Care Manager [facility]   [name] [place], [place]  [address]  August 02, 2020    Stephen [name] [address] [place], [place]  [address]  Dear [name],  This is to inform you that you are no longer eligible to receive medication assistance at [facility].  The reason(s) are:    _____Your total gross monthly household income exceeds 250% of the Federal Poverty Level.   _____Tangible assets (savings, checking, stocks/bonds, pension, retirement, etc.) exceeds our limit  _____You are eligible to receive benefits from [facility], [facility] or HIV Medication              Assistance Program _____You are eligible to receive benefits from a Medicare Part D plan _____You have prescription insurance  _____You are not an [facility] resident __X__Failure to provide all requested proof of income information for 2021.    Medication assistance will resume once all requested financial information has been returned to our clinic.  If you have questions, please contact our clinic at [phone].    Thank you,  [facility]

## 2020-08-02 NOTE — Telephone Encounter (Signed)
Formatting of this note is different from the original.  Images from the original note were not included.  Patient failed to provide 2021 proof of income.  No additional medication assistance will be provided by [facility] without the required proof of income documentation.  Patient notified by letter.    [name]  Care Manager  [facility]    [name]  [place], [place]  [address]    August 02, 2020    Thomas Browning  [address]  [place], [place]  [address]    Dear [name],    This is to inform you that you are no longer eligible to receive medication assistance at [facility].  The reason(s) are:      _____Your total gross monthly household income exceeds 250% of the Federal Poverty Level.    _____Tangible assets (savings, checking, stocks/bonds, pension, retirement, etc.) exceeds our limit   _____You are eligible to receive benefits from [facility], [facility] or HIV Medication               Assistance Program  _____You are eligible to receive benefits from a Medicare Part ?D? plan  _____You have prescription insurance   _____You are not an [facility] resident  __X__Failure to provide all requested proof of income information for 2021.      Medication assistance will resume once all requested financial information has been returned to our clinic.  If you have questions, please contact our clinic at [phone].      Thank you,    [facility]  Electronically signed by [name] at 08/02/2020 11:35 AM EST

## 2020-09-27 ENCOUNTER — Ambulatory Visit (INDEPENDENT_AMBULATORY_CARE_PROVIDER_SITE_OTHER): Payer: Self-pay | Admitting: Vascular Surgery
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
## 2022-12-31 ENCOUNTER — Inpatient Hospital Stay: Primary: Diagnostic Radiology

## 2022-12-31 ENCOUNTER — Inpatient Hospital Stay
Admission: EM | Admit: 2022-12-31 | Discharge: 2023-01-10 | Disposition: A | Discharge status: Home / Self Care | Admitting: Internal Medicine

## 2022-12-31 ENCOUNTER — Emergency Department: Admit: 2022-12-31 | Primary: Diagnostic Radiology

## 2022-12-31 DIAGNOSIS — A419 Sepsis, unspecified organism: Principal | ICD-10-CM

## 2022-12-31 DIAGNOSIS — R0602 Shortness of breath: Secondary | ICD-10-CM

## 2022-12-31 DIAGNOSIS — J90 Pleural effusion, not elsewhere classified: Secondary | ICD-10-CM

## 2022-12-31 LAB — STREP PNEUMONIAE ANTIGEN: STREP PNEUMONIAE ANTIGEN, URINE: NEGATIVE

## 2022-12-31 LAB — CBC WITH AUTO DIFFERENTIAL
Atypical Lymphocytes: 2 % — ABNORMAL HIGH (ref 0–0)
Hematocrit: 42.5 % (ref 36.0–55.0)
Hemoglobin: 14.2 gm/dl (ref 13.0–18.0)
Immature Granulocytes %: 9.2 % (ref 0.0–3.0)
Lymphocytes: 7 % — ABNORMAL LOW (ref 28–48)
MCH: 28.9 pg (ref 23.0–34.6)
MCHC: 33.4 gm/dl (ref 30.0–36.0)
MCV: 86.6 fL (ref 80.0–98.0)
MPV: 9.4 fL (ref 6.0–10.0)
Monocytes: 8 % (ref 1–13)
Myelocytes: 2 % — ABNORMAL HIGH (ref 0–0)
Neutrophils Segmented: 81 % — ABNORMAL HIGH (ref 34–64)
Nucleated RBCs: 0 (ref 0–0)
Platelet Appearance: NORMAL
Platelets: 419 10*3/uL (ref 140–450)
RBC Morphology: NORMAL
RBC: 4.91 M/uL (ref 3.80–5.70)
RDW: 44.9 — ABNORMAL HIGH (ref 35.1–43.9)
WBC: 34.5 10*3/uL — ABNORMAL HIGH (ref 4.0–11.0)

## 2022-12-31 LAB — POCT URINALYSIS DIPSTICK
Glucose, Ur: NEGATIVE mg/dl
Ketones, Urine: 40 mg/dl — AB
Leukocyte Esterase, Urine: NEGATIVE
Nitrite, Urine: NEGATIVE
Protein, Urine: 100 mg/dl — AB
Specific Gravity, UA: 1.02 (ref 1.005–1.030)
Urobilinogen, Urine: >=8 EU/dl (ref 0.0–1.0)
pH, Urine: 5.5 (ref 5–9)

## 2022-12-31 LAB — URINALYSIS
Glucose, Ur: NEGATIVE mg/dl
Ketones, Urine: 40 mg/dl — AB
Leukocyte Esterase, Urine: NEGATIVE
Nitrite, Urine: POSITIVE — AB
Protein, Urine: 100 mg/dl — AB
Specific Gravity, UA: 1.02 (ref 1.005–1.030)
Urobilinogen, Urine: >=8 mg/dl (ref 0.0–1.0)
pH, Urine: 5.5 (ref 5.0–9.0)

## 2022-12-31 LAB — COMPREHENSIVE METABOLIC PANEL
ALT: 27 U/L (ref 10–49)
AST: 34 U/L — ABNORMAL HIGH (ref 0.0–33.9)
Albumin: 3 gm/dl — ABNORMAL LOW (ref 3.4–5.0)
Alkaline Phosphatase: 120 U/L — ABNORMAL HIGH (ref 46–116)
Anion Gap: 9 mmol/L (ref 5–15)
BUN: 30 mg/dl — ABNORMAL HIGH (ref 9–23)
CO2: 25 mEq/L (ref 20–31)
Calcium: 9.2 mg/dl (ref 8.7–10.4)
Chloride: 99 mEq/L (ref 98–107)
Creatinine: 1.34 mg/dl — ABNORMAL HIGH (ref 0.70–1.30)
GFR African American: >60
GFR Non-African American: 58
Glucose: 113 mg/dl — ABNORMAL HIGH (ref 74–106)
Potassium: 3.9 mEq/L (ref 3.5–5.1)
Sodium: 134 mEq/L — ABNORMAL LOW (ref 136–145)
Total Bilirubin: 1.6 mg/dl — ABNORMAL HIGH (ref 0.30–1.20)
Total Protein: 7.7 gm/dl (ref 5.7–8.2)

## 2022-12-31 LAB — MRSA BY PCR: MRSA PCR screen: NEGATIVE

## 2022-12-31 LAB — RESPIRATORY PATHOGEN PANEL
Adenovirus: NOT DETECTED
Bordetella Pertussis: NOT DETECTED
Chlamydophilia pneumoniae: NOT DETECTED
Coronavirus 299E: NOT DETECTED
Coronavirus HKU1: NOT DETECTED
Coronavirus NL63: NOT DETECTED
Coronavirus OC43: NOT DETECTED
Human Metapneumovirus: NOT DETECTED
INFLUENZA A H1: NOT DETECTED
Influenza A H3: NOT DETECTED
Influenza A PCR: NOT DETECTED
Influenza B PCR: NOT DETECTED
Influenza H1N1: NOT DETECTED
Mycoplasma pneumoniae: NOT DETECTED
Parainfluenza 1: NOT DETECTED
Parainfluenza 2: NOT DETECTED
Parainfluenza 3: NOT DETECTED
Parainfluenza: NOT DETECTED
RSV PCR: NOT DETECTED
Rhinovirus/Enterovirus: NOT DETECTED
SARS-CoV-2: NOT DETECTED

## 2022-12-31 LAB — ETHANOL: Ethanol Lvl: 4.2 mg/dl — ABNORMAL HIGH (ref 0.0–3.0)

## 2022-12-31 LAB — PROCALCITONIN: Procalcitonin: 5.19 ng/ml — ABNORMAL HIGH (ref 0.00–0.50)

## 2022-12-31 LAB — LEGIONELLA ANTIGEN, URINE: Legionella Urinary Ag: NEGATIVE

## 2022-12-31 LAB — MICROSCOPIC URINALYSIS

## 2022-12-31 LAB — URINE DRUG SCREEN
Amphetamine: NEGATIVE
Barbiturates, Urine: NEGATIVE
Benzodiazepines, Urine: NEGATIVE
Cocaine: NEGATIVE
Marijuana: NEGATIVE
Methadone Screen, Urine: NEGATIVE
Opiates, Urine: NEGATIVE
Phencyclidine: NEGATIVE

## 2022-12-31 LAB — PROBNP, N-TERMINAL: NT Pro-BNP: 1729 — ABNORMAL HIGH (ref 0–125)

## 2022-12-31 LAB — TROPONIN: Troponin, High Sensitivity: 27 ng/L (ref 0–53)

## 2022-12-31 LAB — PROTIME-INR
INR: 1.7 — ABNORMAL HIGH (ref 0.1–1.1)
Protime: 19 seconds — ABNORMAL HIGH (ref 10.2–12.9)

## 2022-12-31 LAB — RAPID STREP SCREEN: Strep A Ag: NEGATIVE

## 2022-12-31 LAB — APTT: aPTT: 34.6 seconds (ref 25.1–36.5)

## 2022-12-31 LAB — LACTIC ACID: Lactate: 1.7 mmol/L (ref 0.5–2.2)

## 2022-12-31 MED ORDER — PIPERACILLIN SOD-TAZOBACTAM SO 4.5 (4-0.5) G IV SOLR
4.54-0.5 (4-0.5) g | Freq: Once | INTRAVENOUS | Status: AC
Start: 2022-12-31 — End: 2022-12-31
  Administered 2022-12-31: 21:00:00 4500 mg via INTRAVENOUS

## 2022-12-31 MED ORDER — IOPAMIDOL 61 % IV SOLN
61 | Freq: Once | INTRAVENOUS | Status: AC | PRN
Start: 2022-12-31 — End: 2023-01-01
  Administered 2023-01-01: 20:00:00 80 mL via INTRAVENOUS

## 2022-12-31 MED ORDER — GUAIFENESIN ER 600 MG PO TB12
600 | Freq: Two times a day (BID) | ORAL | Status: DC
Start: 2022-12-31 — End: 2022-12-31

## 2022-12-31 MED ORDER — SODIUM CHLORIDE 0.9 % IV SOLN (MINI-BAG)
0.9 % | Freq: Three times a day (TID) | INTRAVENOUS | Status: DC
Start: 2022-12-31 — End: 2023-01-03
  Administered 2023-01-01 – 2023-01-03 (×9): 4500 mg via INTRAVENOUS

## 2022-12-31 MED ORDER — METHYLPREDNISOLONE NA SUC (PF) 125 MG IJ SOLR
125 | Freq: Once | INTRAMUSCULAR | Status: AC
Start: 2022-12-31 — End: 2022-12-31
  Administered 2022-12-31: 21:00:00 125 mg via INTRAVENOUS

## 2022-12-31 MED ORDER — NICARDIPINE HCL IN NACL 20-0.86 MG/200ML-% IV SOLN
INTRAVENOUS | Status: DC
Start: 2022-12-31 — End: 2023-01-01
  Administered 2022-12-31: 23:00:00 5 mg/h via INTRAVENOUS
  Administered 2023-01-01 (×3): 10 mg/h via INTRAVENOUS

## 2022-12-31 MED ORDER — FUROSEMIDE 10 MG/ML IJ SOLN
10 | Freq: Two times a day (BID) | INTRAMUSCULAR | Status: DC
Start: 2022-12-31 — End: 2022-12-31

## 2022-12-31 MED ORDER — ALBUTEROL SULFATE 1.25 MG/3ML IN NEBU
1.253 MG/3ML | Freq: Four times a day (QID) | RESPIRATORY_TRACT | Status: DC | PRN
Start: 2022-12-31 — End: 2023-01-10

## 2022-12-31 MED ORDER — BENZONATATE 100 MG PO CAPS
100 | Freq: Three times a day (TID) | ORAL | Status: DC | PRN
Start: 2022-12-31 — End: 2023-01-10
  Administered 2023-01-02 – 2023-01-05 (×3): 100 mg via ORAL

## 2022-12-31 MED ORDER — NYSTATIN 100000 UNIT/GM EX POWD
100000 | Freq: Two times a day (BID) | CUTANEOUS | Status: DC
Start: 2022-12-31 — End: 2023-01-10
  Administered 2022-12-31 – 2023-01-10 (×19): via TOPICAL

## 2022-12-31 MED ORDER — VANCOMYCIN 2000 MG IN NS 500 ML (PREMIX) IVPB
Status: AC
Start: 2022-12-31 — End: 2022-12-31
  Administered 2022-12-31: 23:00:00 2000 mg via INTRAVENOUS

## 2022-12-31 MED ORDER — FUROSEMIDE 10 MG/ML IJ SOLN
10 | Freq: Once | INTRAMUSCULAR | Status: AC
Start: 2022-12-31 — End: 2022-12-31
  Administered 2022-12-31: 21:00:00 40 mg via INTRAVENOUS

## 2022-12-31 MED FILL — VANCOMYCIN 2000 MG IN NS 500 ML (PREMIX) IVPB: Qty: 500

## 2022-12-31 MED FILL — SOLU-MEDROL (PF) 125 MG IJ SOLR: 125 MG | INTRAMUSCULAR | Qty: 125

## 2022-12-31 MED FILL — FUROSEMIDE 10 MG/ML IJ SOLN: 10 MG/ML | INTRAMUSCULAR | Qty: 4

## 2022-12-31 MED FILL — NYSTOP 100000 UNIT/GM EX POWD: 100000 UNIT/GM | CUTANEOUS | Qty: 15

## 2022-12-31 MED FILL — ISOVUE-300 61 % IV SOLN: 61 % | INTRAVENOUS | Qty: 80

## 2022-12-31 MED FILL — CARDENE IV 20-0.86 MG/200ML-% IV SOLN: INTRAVENOUS | Qty: 200

## 2022-12-31 MED FILL — PIPERACILLIN SOD-TAZOBACTAM SO 4.5 (4-0.5) G IV SOLR: 4.5 (4-0.5) g | INTRAVENOUS | Qty: 4500

## 2022-12-31 NOTE — ED Notes (Signed)
Patient placed on cardiac monitor, patient requesting nothing at this time. Patient placed on 4L nasal cannula as patient was o2 sats were 88% room air and 92% on 2L. Patient reports feeling better after a few minutes on 4L.      Pamalee Leyden, RN  12/31/22 [phone]

## 2022-12-31 NOTE — Consults (Signed)
CARDIOTHORACIC CONSULT NOTE        Patient: Thomas Browning Age: 57 y.o. Sex: male    Date of Birth: [birth year] Admit Date: 12/31/2022 PCP: No primary care provider on file.   MRN: [id number]  CSN: [id number]       Date/Time:  12/31/2022 5:57 PM    Requesting Physician:  Dr. Renae Fickle, MD (Emergency Medicine)      Reason for consult: Large right pleural effusion vs empyema    ASSESSMENT/PLAN:     [name] is a 57 y.o. male with hypertension, morbid obesity, large right pleural effusion with leukocytosis of 34K.    -Appreciate admission to medicine service, thoracic surgery will follow as consult.  -NPO past midnight for CT guided IR chest tube placement.  -Will plan for potential initiation of lytic therapy tomorrow.  -Agree with continuation of antimicrobial therapy, with close monitoring of creatinine.  -Patient seen with Dr. Effie Shy at bedside in ED with intention of placing right chest tube, however patient declines to have chest tube placed in ED at this time as he would like to wait.    Principal Problem:    Pleural effusion on right  Active Problems:    SOB (shortness of breath)  Resolved Problems:    * No resolved hospital problems. *      HPI:     Thomas Browning is a 57 y.o. male with hypertension, morbid obesity presented to the ER due to a productive cough with green/grey sputum, worsening shortness of breath for the past 2 days that has advanced to the point that he felt he could not complete his activities of daily living.  He was seen and evaluated in the Emergency Department and found to have a right large pleural effusion with leukocytosis of 34K, tachycardia on admission.     Past Medical History:   Diagnosis Date    HTN (hypertension)     Morbid obesity (HCC)      History reviewed. No pertinent surgical history.  History reviewed. No pertinent family history.  Social History     Socioeconomic History    Marital status: Unknown     Spouse name: None    Number of children: None    Years of education:  None    Highest education level: None     Prior to Admission medications    Not on File     Current Facility-Administered Medications   Medication Dose Route Frequency Provider Last Rate Last Admin    vancomycin (VANCOCIN) 2000 mg in 0.9% sodium chloride 500 mL IVPB  2,000 mg IntraVENous NOW Algis Downs, MD        [START ON 01/01/2023] piperacillin-tazobactam (ZOSYN) 4,500 mg in sodium chloride 0.9 % 100 mL IVPB (mini-bag)  4,500 mg IntraVENous q8h Algis Downs, MD        iopamidol (ISOVUE-300) 61 % injection 80 mL  80 mL IntraVENous ONCE PRN Algis Downs, MD        benzonatate (TESSALON) capsule 100 mg  100 mg Oral TID PRN Richarda Overlie, DO        albuterol (ACCUNEB) nebulizer solution 1.25 mg  1.25 mg Nebulization Q6H PRN Hutchinson, Anne, DO        nystatin (MYCOSTATIN) powder   Topical BID Hutchinson, Anne, DO        [START ON 01/01/2023] furosemide (LASIX) injection 20 mg  20 mg IntraVENous BID Hutchinson, Anne, DO         No current outpatient medications  on file.     No Known Allergies    Review of Systems  Constitutional: No Fever, No weight loss.  Eyes: Negative  Ears, Nose, Mouth, Throat, and Face: Negative  Respiratory: (+) cough, (+)productive green/grey sputum, (+) rhinorrhea  Cardiovascular: (+) tachycardia  Gastrointestinal: Negative  Genitourinary: Negative  Musculoskeletal: Negative  Integument: Negative  Hematologic/Lymphatic: Negative  Neurological: Negative  Endocrine: Negative  Psychiatric: Negative    Physical Exam:   Visit Vitals  BP (!) 160/101   Pulse 96   Temp 97.2 F (36.2 C) (Oral)   Resp 23   Ht 1.803 m (5\' 11" )   Wt (!) 149.7 kg (330 lb)   SpO2 95%   BMI 46.03 kg/m       Physical Exam:  Constitutional: Awake, alert, oriented to person, place, time and situation.  Sitting up in ED stretcher, appears short of breath.  Head: Atraumatic  Eyes: Sclera without jaundice or injection.  Respiratory: Decreased breath sounds to right lower lobe.  Cardiovascular: S1/S2 audible,  regular rhythm, tachycardic rate.  Gastrointestinal: Abdomen obese, soft, nontender, not distended.  Neurological: Grossly intact.  Neck: Trachea in midline.  Extremities: No edema.  Psychiatric: Oriented to person, time, place.  Appropriate questions.  Expresses understanding.  Skin:  No Rash    Labs:  Recent Labs     12/31/22  1433   WBC 34.5*   HGB 14.2   HCT 42.5   PLT 419     Recent Labs     12/31/22  1433 12/31/22  1654   NA 134*  --    K 3.9  --    CL 99  --    CO2 25  --    GLUCOSE 113*  --    BUN 30*  --    CREATININE 1.34*  --    ALT 27  --    INR  --  1.7*     No results for input(s): "PH", "PCO2", "PO2", "HCO3", "FIO2" in the last 72 hours.    Images:  XR CHEST PORTABLE  Narrative: Indication: SOB. .  Impression: IMPRESSION: Portable AP upright view the chest exposed at 2:48 PM Dec 31, 2022  reveals a moderate to large right loculated pleural effusion with atelectasis in  the underlying lung..  The left lung is clear and the heart is of normal size.  No prior studies for comparison.    Electronically signed by: Dirk Dress III, MD 12/31/2022 2:57 PM EDT            Workstation ID: [id number]      Total time spent was approximately 45 minutes of which more than 50% was spent in coordination of care and counseling (time spent with patient/family face to face, physical exam, reviewing laboratory and imaging investigations, speaking with physicians and nursing staff involved in this patient's care)    This note was generated with the aid of a dictation system.  Unintentional errors may be present.    Felix Pacini, PA-C  Dec 31, 2022  5:57 PM

## 2022-12-31 NOTE — ED Notes (Signed)
Patient medicated per MAR orders, patient updated on plan of care. Patient on cardiac monitor, patient updated that he is NPO ice chips only. Patient given ice chips.      Pamalee Leyden, RN  12/31/22 [phone]

## 2022-12-31 NOTE — ED Notes (Signed)
Report given to Diinah, RN.      Pamalee Leyden, RN  12/31/22 1931

## 2022-12-31 NOTE — Consults (Signed)
[facility]    Cardiology Consultation Note    Thomas Browning 57 y.o.  DOB: 1965-12-17  MRN: [id number]  SSN: [id number]      PCP: No primary care provider on file.  Primary Cardiologist: Dr. Brent General    DOA: 12/31/2022     Impression and Recommendations:   57 year old gentleman with morbid obesity, nonalcoholic history who presented with progressively worsening dyspnea for 2 weeks.  He was noted to have leukocytosis, right pleural effusion and elevated BNP.  Cardiology is consulted for evaluation for heart failure.    Dyspnea: Patient's presentation appears to be most consistent with sepsis with likely pneumonia as a source.  He has marked leukocytosis with left shift, likely recent sick contact and cough all suggestive more of infection rather than heart failure.  However, he does have bilateral lower extremity edema, and elevated NT-ProBNP, so it is possible that he has HFpEF in the setting of morbid obesity.  However, I do not think heart failure fully explains his presentation.  Defer antibiotics to the primary team.  Recommend full sepsis workup including blood cultures.  Patient with increased work of breathing.  Please check a STAT ABG and lactate.  He may require noninvasive positive pressure ventilation.  Transthoracic echocardiogram  Will diurese with Lasix 40 mg IVP x 1    Prolonged QTc: Patient with QTc of 485 ms.  Anticipate that he will require antibiotics.  Avoid macrolides and fluoroquinolone if possible  Repeat EKG in the morning    HPI:     Chief Complaint: Dyspnea     58 year old gentleman with no known cardiac history, morbid obesity who presented with progressive worsening dyspnea for 2 weeks.  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  His dyspnea symptoms are associated with cough and rhinorrhea.  He also reports feeling warm and diaphoretic, however he did not check his  temperature at home.  He does not have any known cardiac history.  He denies any history of heart failure coronary disease.  He also denies PND, orthopnea leading up to the hospitalization.  He does report some lower extremity edema    Past Medical History:     No past medical history on file.  No past surgical history on file.    Social History:     Social History     Socioeconomic History    Marital status: Unknown     Spouse name: Not on file    Number of children: Not on file    Years of education: Not on file    Highest education level: Not on file   Occupational History    Not on file   Tobacco Use    Smoking status: Not on file    Smokeless tobacco: Not on file   Substance and Sexual Activity    Alcohol use: Not on file    Drug use: Not on file    Sexual activity: Not on file   Other Topics Concern    Not on file   Social History Narrative    Not on file     Social Determinants of Health     Financial Resource Strain: Not on file   Food Insecurity: Not on file   Transportation Needs: Not on file   Physical Activity: Not on file   Stress: Not on file   Social Connections: Not on file   Intimate Partner Violence: Not  on file   Housing Stability: Not on file         Family History:     No family history on file.    No Known Allergies    Home Medications:     Prior to Admission medications    Not on File        None       Physical Examination:     Vitals:    12/31/22 1453   BP:    Pulse: 98   Resp: 28   Temp:    SpO2: 97%     GEN: alert, orientedx3, in respiratory distress  HEENT: PERRLA, mucous membranes are moist, No jugular venous distention or carotid bruit is noted  PULM: Decreased breath sounds over left lung base, no wheezing  CARD:  Normal S1/S2, no m/r/g  ABD:  Soft, non-tender, non-distended  NEURO:  No focal deficit, CN II-XII intact  EXT: 1+ edema of the bilateral knees    Cardiovascular Studies:     ECG 12/31/22: NSR 96 bpm, iLBBB, prolonged QTc (485 ms)    Labs:     GFR: CrCl cannot be calculated  (Unknown ideal weight.).     Labs:  CMP:   Recent Labs     12/31/22  1433   NA 134*   K 3.9   CL 99   CO2 25   BUN 30*   CREATININE 1.34*   GLUCOSE 113*   CALCIUM 9.2   BILITOT 1.60*   ALKPHOS 120*   AST 34.0*   ALT 27        CBC:  Recent Labs     12/31/22  1433   WBC 34.5*   RBC 4.91   HGB 14.2   HCT 42.5   MCV 86.6   MCH 28.9   MCHC 33.4   RDW 44.9*   PLT 419   MPV 9.4      HS-Troponin:  Lab Results   Component Value Date/Time    TROPHS 27 12/31/2022 02:33 PM       NT-ProBNP 1729    Medications:  No current outpatient medications    [name], MD, [facility], RPVI, [facility]  Interventional Cardiologist  [facility]  Pager: [phone]

## 2022-12-31 NOTE — ED Provider Notes (Shared)
[facility]  Emergency Department Treatment Report        Patient: Thomas Browning Age: 57 y.o. Sex: male    Date of Birth: [birth year] Admit Date: 12/31/2022 PCP: No primary care provider on file.   MRN: [id number]  CSN: [id number]     Room: ER22/ER22 Time Dictated: 3:39 PM            Chief Complaint   Chief Complaint   Patient presents with    Shortness of Breath       History of Present Illness   This is a 57 y.o. male past medical history not on file who presents with shortness of breath.  Patient states has been going on for the past few days associate with cough but patient does not feel sick.  Notes he is not having any chest pain and is having this of breath it is worse with laying down.  Some intermittent coming and going leg swelling and patient is not on oxygen but is requiring oxygen here.  Patient is a non-smoker but does occasionally smoke marijuana few times per month.  Patient's not on steroids takes no medications history of edema history of cellulitis    Review of Systems   As outlined in HPI    Past Medical/Surgical History   No past medical history on file.  No past surgical history on file.    Social History     Social History     Socioeconomic History    Marital status: Unknown     Spouse name: Not on file    Number of children: Not on file    Years of education: Not on file    Highest education level: Not on file   Occupational History    Not on file   Tobacco Use    Smoking status: Not on file    Smokeless tobacco: Not on file   Substance and Sexual Activity    Alcohol use: Not on file    Drug use: Not on file    Sexual activity: Not on file   Other Topics Concern    Not on file   Social History Narrative    Not on file     Social Determinants of Health     Financial Resource Strain: Not on file   Food Insecurity: Not on file   Transportation Needs: Not on file   Physical Activity: Not on file   Stress: Not on file   Social Connections: Not on file   Intimate Partner Violence: Not  on file   Housing Stability: Not on file       Family History   No family history on file.    Current Medications     Current Facility-Administered Medications   Medication Dose Route Frequency Provider Last Rate Last Admin    vancomycin (VANCOCIN) 2000 mg in 0.9% sodium chloride 500 mL IVPB  2,000 mg IntraVENous NOW Algis Downs, MD        piperacillin-tazobactam (ZOSYN) 3,375 mg in sodium chloride 0.9 % 100 mL IVPB (mini-bag)  3,375 mg IntraVENous Q6H Algis Downs, MD         No current outpatient medications on file.       Allergies   No Known Allergies    Physical Exam   Patient Vitals for the past 24 hrs:   Temp Pulse Resp BP SpO2   12/31/22 1453 -- 98 28 -- 97 %   12/31/22 1410 97.2 F (  36.2 C) (!) 102 26 (!) 153/88 92 %     Physical Exam  Constitutional:       Appearance: Normal appearance.   HENT:      Head: Normocephalic.      Nose: Nose normal.      Mouth/Throat:      Mouth: Mucous membranes are moist.   Cardiovascular:      Rate and Rhythm: Normal rate.   Pulmonary:      Effort: Pulmonary effort is normal.      Breath sounds: Decreased breath sounds present.   Musculoskeletal:         General: No swelling or deformity. Normal range of motion.      Cervical back: Normal range of motion.   Skin:     General: Skin is warm.      Coloration: Skin is not jaundiced.   Neurological:      General: No focal deficit present.      Mental Status: He is alert.      Cranial Nerves: No cranial nerve deficit.   Psychiatric:         Mood and Affect: Mood normal.         Thought Content: Thought content normal.           Impression and Management Plan   RECORDS REVIEWED:  I reviewed the patient's previous records here at [facility] and available outside facilities and note that no recent ER visits last seen in [facility] for neck sprain followed outpatient by vascular surgery    EXTERNAL RESULTS REVIEWED: Vascular as above    INDEPENDENT HISTORIAN:  History and/or plan development assisted by: Patient    Severe  exacerbation or progression of chronic illness: Acute shortness of breath      Threat to body function without evaluation and management:  Loss of life or limb      SOCIAL DETERMINANTS  impacting Evaluation and Management:      Comorbidities impacting Evaluation and Management: Shortness of breath    Differential diagnoses CHF COPD pneumonia    Initial impression: Presents with shortness of breath.  States she does not feel sick elevated BNP largely elevated white count without immature cells    Diagnostic Studies   Lab:   Results for orders placed or performed during the hospital encounter of 12/31/22   CBC with Auto Differential   Result Value Ref Range    WBC 34.5 (H) 4.0 - 11.0 1000/mm3    RBC 4.91 3.80 - 5.70 M/uL    Hemoglobin 14.2 13.0 - 18.0 gm/dl    Hematocrit 54.0 98.1 - 55.0 %    MCV 86.6 80.0 - 98.0 fL    MCH 28.9 23.0 - 34.6 pg    MCHC 33.4 30.0 - 36.0 gm/dl    Platelets 191 478 - 450 1000/mm3    MPV 9.4 6.0 - 10.0 fL    RDW 44.9 (H) 35.1 - 43.9      Nucleated RBCs 0 0 - 0      Immature Granulocytes % 9.2 (HH) 0.0 - 3.0 %    Neutrophils Segmented 81.0 (H) 34 - 64 %    Lymphocytes 7.0 (L) 28 - 48 %    Atypical Lymphocytes 2.0 (H) 0 - 0 %    Monocytes 8.0 1 - 13 %    Myelocytes 2.0 (H) 0 - 0 %    RBC Morphology NORMAL      Platelet Appearance NORMAL     CMP  Result Value Ref Range    Potassium 3.9 3.5 - 5.1 mEq/L    Chloride 99 98 - 107 mEq/L    Sodium 134 (L) 136 - 145 mEq/L    CO2 25 20 - 31 mEq/L    Glucose 113 (H) 74 - 106 mg/dl    BUN 30 (H) 9 - 23 mg/dl    Creatinine 1.61 (H) 0.70 - 1.30 mg/dl    GFR African American >60      GFR Non-African American 58      Calcium 9.2 8.7 - 10.4 mg/dl    Anion Gap 9 5 - 15 mmol/L    AST 34.0 (H) 0.0 - 33.9 U/L    ALT 27 10 - 49 U/L    Alkaline Phosphatase 120 (H) 46 - 116 U/L    Total Bilirubin 1.60 (H) 0.30 - 1.20 mg/dl    Total Protein 7.7 5.7 - 8.2 gm/dl    Albumin 3.0 (L) 3.4 - 5.0 gm/dl   proBNP, N-TERMINAL   Result Value Ref Range    NT Pro-BNP 1,729 (H) 0  - 125     Troponin   Result Value Ref Range    Troponin, High Sensitivity 27 0 - 53 ng/L     Imaging:    XR CHEST PORTABLE   Final Result   IMPRESSION: Portable AP upright view the chest exposed at 2:48 PM Dec 31, 2022   reveals a moderate to large right loculated pleural effusion with atelectasis in   the underlying lung..  The left lung is clear and the heart is of normal size.   No prior studies for comparison.      Electronically signed by: Dirk Dress III, MD 12/31/2022 2:57 PM EDT             Workstation ID: [id number]               EKG: Interpreted by me as sinus 96 normal axis normal intervals no ST elevation or depression hypertrophy    Cardiac Monitor Interpretation: Sinus    Imaging:Interpreted by me as right-sided effusion    Other studies:  My interpretation of other studies is that they show, among other things,   BNP is elevated, slightly low albumin elevated bili elevated AST slightly elevated creatinine BNP of 1700 white count is 34 shift but no bands myelocytes atypical lymphocytes but no bands    ED Course         Critical Care Time (if necessary)     Medications   vancomycin (VANCOCIN) 2000 mg in 0.9% sodium chloride 500 mL IVPB (has no administration in time range)   piperacillin-tazobactam (ZOSYN) 3,375 mg in sodium chloride 0.9 % 100 mL IVPB (mini-bag) (has no administration in time range)           NARRATIVE:  Cardiology consulted.  Certainly somewhere.  between CHF and pneumonia will need both cardiology and pulmonology in addition to hospitalist admission      Medical Decision Making   As abov e        Final Diagnosis     No diagnosis found.        Disposition   ***      Renae Fickle, MD  Dec 31, 2022    My signature above authenticates this document and my orders, the final    diagnosis (es), discharge prescription (s), and instructions in the Epic    record.  If you have any questions please contact [phone]  Nursing notes have been reviewed by the physician/ advanced practice     Clinician.

## 2022-12-31 NOTE — ED Notes (Signed)
Report received from Petronila, RN  Introduced self to pt, white board updated  Verified medications  Pt in bed, on monitor with call light in reach  No further assistance needed at this time     Johney Maine, RN  12/31/22 1947

## 2022-12-31 NOTE — ED Notes (Signed)
Per Dr. Charlett Nose patient could have water, writer provided some.      Winfrey Chillemi, Suzan Slick  12/31/22 1517

## 2022-12-31 NOTE — H&P (Addendum)
Admission History and Physical  Thomas Browning D.O.     Patient: Thomas Browning Age: 57 y.o. Sex: male    Date of Birth: 1966/05/14 Admit Date: 12/31/2022 Admit Doctor: Thomas Overlie, DO   MRN: [id number]  CSN: [id number]  PCP: No primary care provider on file.       Assessment / Plan       Large loculated right pleural effusion, ?  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  Patient may have difficulty in his ability to tolerate laying flat for CT scan right now, pulmonary and cardiothoracic teams have been consulted greatly appreciate input.  Thoracic surgery recommends CT-guided IR chest tube placement with potential initiation of lytic therapy tomorrow, Dr. Effie Shy also recommends CT scan of his chest after drainage for better clarification.  Will collect fluid studies to evaluate cell counts culture cytology etc.  -Currently on broad-spectrum antibiotics vancomycin and Zosyn, due to prolonged QTc would avoid fluoroquinolones and azithromycin, can add doxycycline for coverage of atypical sources of infection  -White count is impressive, no recent steroids, he does have some wheezing upper airway sounds, is swallowing and protecting his airway adequately but does have frequent cough.  Start Mucinex to  loosen congestion and Tessalon as needed for cough.  Patient given 125 of Solu-Medrol in ER, on reevaluation wheezing is somewhat improved, he denies history of smoking denies history of COPD or reactive airway disease or childhood asthma or frequent bronchitis, due to risk of worsening infection in setting of steroid use will hold further steroids unless his wheezing worsens.  Nebulizers can be continued as needed.  -Blood pressure quite elevated 1 90-2 20 systolic over 120, started Cardene  -Patient's chart shows that he [place] with both diastolic and systolic heart failure, unclear what his EF is now at that time he had an EF of 45 to 50% with grade 1 diastolic dysfunction, his BNP is above 1700 he has lower extremity edema with symptoms of dyspnea on exertion and orthopnea and now with large pleural effusion, would benefit from diuresis 40 mg IV Lasix in ER has been ordered will monitor output closely along with renal function and electrolytes will start potassium supplementation and continue 40 IV twice daily as well.  -Formal echo is pending.  Ideally patient would be on aspirin which we will hold pending IR procedure tomorrow, can start carvedilol  -Repeat EKG in morning to follow QTc per cardiology recommendation  -Glucomander monitoring and blood sugar correction as needed suspect his blood sugars were elevated in setting of steroid had diagnosed with diabetes last A1c was 6.3 in 2021 no follow-up since then will recheck now  -Patient reported chronic neck pain : Chart shows chronic cervicalgia.  When repositioned in bed able to evaluate the skin on the  back of his neck:?  Cushing type habitus.  Does seem to have some fungal infection versus intertrigo in the skin folds of his neck I ordered nystatin powder: No palpable abscess there.  -Patient reports chronic constipation issues, this has been noted in prior admission as well, start senna and MiraLAX for regimen  -  patient receiving medication therapy which will require intensive monitoring  - Patient will need referral to [facility] as he does not have any current primary care physician    Diet: NPO, sips of clears and ice chips  DVT ppx: SCDs, hold Lovenox until IR drainage procedure      DISPO  -Pt to be admitted  at this time for reasons addressed above, continued hospitalization for ongoing assessment and treatment indicated     Anticipated Date of Discharge: TBD   Anticipated Disposition (home, SNF) : TBD    Code Status: Full code    MPOA: Patient does not elect to appoint anybody as her primary contact at this time      Chief Complaint:   Chief Complaint   Patient presents with    Shortness of Breath         HPI:   Thomas Browning is a 57 y.o. 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  History is primarily obtained from prior hospitalization at outside facility in [place] in 2021.  Patient states he does not follow with a primary care physician and takes no prescription medications "of his own ".    Patient presents with several concerns today.  He states his primary concern is cough and shortness of breath associated with dyspnea on exertion orthopnea and bilateral leg swelling.  He states symptoms have been present for at least "a few days "and gradually worsening.  Has not tried any over-the-counter medications.  He reports his cough is productive of thick mucus, thinks that he has been around a few people who have had similar sick symptoms with respiratory infections.  He denies smoking denies marijuana use denies vaping and reports he has no history of asthma COPD or bronchitis.  To his respiratory complaints he reports feeling generally unwell, reports feeling constipated which she states "has been going on a long time and was never fixed ".  He also reports that he has some  discomfort in his neck which she states "I have all the time ".    Labs in the ER concerning for white blood cell count of 34.5, creatinine 1.3 with a BUN of 30 BNP greater than 1700 troponin is 27 procalcitonin later resulted quite elevated.  Chest x-ray concerning for large loculated right pleural effusion.  Patient was started on supplemental oxygen on 2 to 3 L nasal cannula.  He was started on breathing treatments broad-spectrum antibiotics.  Pulmonary and cardiology as well as thoracic surgery consulted.  EKG revealed prolonged QTc.      Review of Systems -   Review of Systems   Constitutional:  Positive for fatigue. Negative for appetite change and fever.   HENT:  Negative for congestion, sinus pressure and sore throat.    Respiratory:  Positive for cough, chest tightness, shortness of breath and wheezing.         + Orthopnea     Cardiovascular:  Positive for leg swelling.   Gastrointestinal:  Positive for constipation. Negative for abdominal pain, diarrhea, nausea and vomiting.  Endocrine: Negative for polydipsia.   Genitourinary:  Negative for flank pain.   Musculoskeletal:  Negative for myalgias.   Skin:  Negative for rash.   Neurological:  Negative for syncope, speech difficulty, light-headedness and headaches.   Psychiatric/Behavioral:  Negative for dysphoric mood. The patient is not nervous/anxious.            Past Medical History:  Left leg cellulitis 05/31/2020     Cellulitis of left leg 05/31/2020     Hypertension       Sepsis       Hypokalemia       AKI (acute kidney injury)     Hypertension  Heart failure    Past Surgical History:  Tonsillectomy    Family History:  Mother: Hypertension    Social History:  Social History     Socioeconomic History    Marital status: Unknown       Home Medications:  Prior to Admission medications    Not on File       Allergies:  No Known Allergies      Physical Exam:   Visit Vitals  BP (!) 153/88   Pulse 98   Temp 97.2 F (36.2 C) (Oral)   Resp 28   Ht 1.803 m (5'  11")   Wt (!) 149.7 kg (330 lb)   SpO2 97%   BMI 46.03 kg/m     Physical Exam  Constitutional:       General: He is awake.      Appearance: He is morbidly obese. He is ill-appearing.   HENT:      Head: Normocephalic and atraumatic.      Mouth/Throat:      Mouth: Mucous membranes are dry.   Cardiovascular:      Rate and Rhythm: Regular rhythm. Tachycardia present.      Heart sounds: Normal heart sounds, S1 normal and S2 normal.   Pulmonary:      Effort: Tachypnea present.      Breath sounds: Examination of the right-upper field reveals wheezing. Examination of the left-upper field reveals wheezing. Examination of the right-lower field reveals decreased breath sounds. Examination of the left-lower field reveals decreased breath sounds. Decreased breath sounds and wheezing present.   Abdominal:      General: Abdomen is protuberant. Bowel sounds are normal.      Palpations: Abdomen is soft.      Tenderness: There is no abdominal tenderness.   Musculoskeletal:      Cervical back: Neck supple.      Right lower leg: 2+ Edema present.      Left lower leg: 2+ Edema present.   Skin:     General: Skin is warm and dry.      Comments: Venous stasis changes bilateral lower extremities  Intertrigo changes, moist to posterior neck skin folds   Neurological:      General: No focal deficit present.      Mental Status: He is alert and oriented to person, place, and time.   Psychiatric:         Mood and Affect: Affect is blunt and flat.         Behavior: Behavior is cooperative.          Intake and Output:  Current Shift:  No intake/output data recorded.  Last three shifts:  No intake/output data recorded.    Lab/Data Reviewed:  Recent Results (from the past 24 hour(s))   CBC with Auto Differential    Collection  Time: 12/31/22  2:33 PM   Result Value Ref Range    WBC 34.5 (H) 4.0 - 11.0 1000/mm3    RBC 4.91 3.80 - 5.70 M/uL    Hemoglobin 14.2 13.0 - 18.0 gm/dl    Hematocrit 96.0 45.4 - 55.0 %    MCV 86.6 80.0 - 98.0 fL    MCH 28.9  23.0 - 34.6 pg    MCHC 33.4 30.0 - 36.0 gm/dl    Platelets 098 119 - 450 1000/mm3    MPV 9.4 6.0 - 10.0 fL    RDW 44.9 (H) 35.1 - 43.9      Nucleated RBCs 0 0 - 0      Immature Granulocytes % 9.2 (HH) 0.0 - 3.0 %    Neutrophils Segmented 81.0 (H) 34 - 64 %    Lymphocytes 7.0 (L) 28 - 48 %    Atypical Lymphocytes 2.0 (H) 0 - 0 %    Monocytes 8.0 1 - 13 %    Myelocytes 2.0 (H) 0 - 0 %    RBC Morphology NORMAL      Platelet Appearance NORMAL     CMP    Collection Time: 12/31/22  2:33 PM   Result Value Ref Range    Potassium 3.9 3.5 - 5.1 mEq/L    Chloride 99 98 - 107 mEq/L    Sodium 134 (L) 136 - 145 mEq/L    CO2 25 20 - 31 mEq/L    Glucose 113 (H) 74 - 106 mg/dl    BUN 30 (H) 9 - 23 mg/dl    Creatinine 1.47 (H) 0.70 - 1.30 mg/dl    GFR African American >60      GFR Non-African American 58      Calcium 9.2 8.7 - 10.4 mg/dl    Anion Gap 9 5 - 15 mmol/L    AST 34.0 (H) 0.0 - 33.9 U/L    ALT 27 10 - 49 U/L    Alkaline Phosphatase 120 (H) 46 - 116 U/L    Total Bilirubin 1.60 (H) 0.30 - 1.20 mg/dl    Total Protein 7.7 5.7 - 8.2 gm/dl    Albumin 3.0 (L) 3.4 - 5.0 gm/dl   proBNP, N-TERMINAL    Collection Time: 12/31/22  2:33 PM   Result Value Ref Range    NT Pro-BNP 1,729 (H) 0 - 125     Troponin    Collection Time: 12/31/22  2:33 PM   Result Value Ref Range    Troponin, High Sensitivity 27 0 - 53 ng/L       XR CHEST PORTABLE    Result Date: 12/31/2022  Indication: SOB. Marland Kitchen     IMPRESSION: Portable AP upright view the chest exposed at 2:48 PM Dec 31, 2022 reveals a moderate to large right loculated pleural effusion with atelectasis in the underlying lung..  The left lung is clear and the heart is of normal size. No prior studies for comparison. Electronically signed by: Scheryl Darter, MD 12/31/2022 2:57 PM EDT          Workstation ID: [id number]             - I independently reviewed the imaging studies done on the patient and agree with interpretation done by the radiologist and reviewed available blood work from this  presentation  -I discussed the patient with the emergency room physician about the necessity to admit the patient to the hospital  -RECORDS REVIEWED: I reviewed patient's history previous chart  and laboratory results during this admission while making the diagnosis and plan for admission to the hospital- this included but not limited to available notes in Care Everywhere [facility], [facility] system, outpatient PCP records     The total Critical Care time caring for this patient with a life threatening illness is 45 minutes.  This includes direct patient care, reviewing medical records, evaluating results of diagnostic testing, discussions with family members, and consulting with physicians.             Thomas Overlie, DO    Dec 31, 2022  4:43 PM    Dragon medical dictation software was used for portions of this report.  Unintended voice transcription errors may have occurred.

## 2022-12-31 NOTE — ED Notes (Signed)
TRANSFER - OUT REPORT:    Verbal report given to Bridgette on Kue Fambrough being transferred to 5311 for routine progression of patient care       Report consisted of patient's Situation, Background, Assessment and   Recommendations(SBAR).     Information from the following report(s) Nurse Handoff Report, ED SBAR, [facility], and Recent Results was reviewed with the receiving nurse.  Kinder Assessment: Presents to emergency department  because of falls (Syncope, seizure, or loss of consciousness): No, Age > 70: No, Altered Mental Status, Intoxication with alcohol or substance confusion (Disorientation, impaired judgment, poor safety awaremess, or inability to follow instructions): No, Impaired Mobility: Ambulates or transfers with assistive devices or assistance; Unable to ambulate or transer.: Yes, Nursing Judgement: Yes  Lines:   Peripheral IV 12/31/22 Right Antecubital (Active)   Site Assessment Clean, dry & intact 12/31/22 1703   Line Status Blood return noted 12/31/22 1703       Peripheral IV 12/31/22 Left Antecubital (Active)   Site Assessment Clean, dry & intact 12/31/22 1703   Line Status Blood return noted 12/31/22 1703      Medications sent with patient from pharmacy: None to send  Patient belongings: Belongings sent to floor    Opportunity for questions and clarification was provided.      Patient transported with:  Monitor and Registered Nurse          Johney Maine, RN  12/31/22 2109

## 2022-12-31 NOTE — ED Triage Notes (Signed)
The patient presents for intermittent SOB for several weeks that increased today. The patient denies known CHF history, but reports bilateral leg swelling.

## 2022-12-31 NOTE — ED Notes (Signed)
Patient medicated per MAR orders, patient updated on plan of care. Patient on cardiac monitor, chest tube cart outside room.      Pamalee Leyden, RN  12/31/22 [phone]

## 2023-01-01 ENCOUNTER — Inpatient Hospital Stay: Admit: 2023-01-01 | Primary: Diagnostic Radiology

## 2023-01-01 DIAGNOSIS — J302 Other seasonal allergic rhinitis: Secondary | ICD-10-CM | POA: Diagnosis not present

## 2023-01-01 DIAGNOSIS — J90 Pleural effusion, not elsewhere classified: Secondary | ICD-10-CM | POA: Diagnosis not present

## 2023-01-01 DIAGNOSIS — I10 Essential (primary) hypertension: Secondary | ICD-10-CM | POA: Diagnosis not present

## 2023-01-01 LAB — CBC WITH AUTO DIFFERENTIAL
Hematocrit: 44.6 % (ref 36.0–55.0)
Hemoglobin: 14.4 gm/dl (ref 13.0–18.0)
Lymphocytes: 5 % — ABNORMAL LOW (ref 28–48)
MCH: 28.2 pg (ref 23.0–34.6)
MCHC: 32.3 gm/dl (ref 30.0–36.0)
MCV: 87.3 fL (ref 80.0–98.0)
MPV: 9.6 fL (ref 6.0–10.0)
Metamyelocytes: 2 % — ABNORMAL HIGH (ref 0–0)
Monocytes: 6 % (ref 1–13)
Myelocytes: 1 % — ABNORMAL HIGH (ref 0–0)
Neutrophils Segmented: 85 % — ABNORMAL HIGH (ref 34–64)
Nucleated RBCs: 1
Platelet Appearance: NORMAL
Platelets: 444 10*3/uL (ref 140–450)
Poikilocytosis: 2
Promyelocytes: 1
RBC: 5.11 M/uL (ref 3.80–5.70)
RDW: 45.7 — ABNORMAL HIGH (ref 35.1–43.9)
WBC: 36.2 10*3/uL — ABNORMAL HIGH (ref 4.0–11.0)

## 2023-01-01 LAB — POC CG4:BLOOD GAS,LACTATE
Base Excess: -1 mmol/L (ref <=3)
HCO3: 24.2 mmol/L (ref 18.0–26.0)
LITERS PER MINUTE: 4
Lactate: 1.34 mmol/L (ref 0.40–2.00)
O2 Sat: 98 % (ref 90–100)
PCO2: 42.6 mm Hg (ref 35.0–45.0)
PO2: 102 mm Hg — ABNORMAL HIGH (ref 75–100)
Ph: 7.362 (ref 7.350–7.450)
Total CO2: 25 mmol/L (ref 24–29)

## 2023-01-01 LAB — CELL COUNT WITH DIFFERENTIAL, BODY FLUID
Body Fluid Wbc: 11247
Lymphocytes, Body Fluid: 14 %
Monocytes, Body Fluid: 4 %
SEGMENTED NEUTROPHILS, BODY FLUID: 82 %

## 2023-01-01 LAB — HEMOGLOBIN A1C: Hemoglobin A1C: 5.8 % — ABNORMAL HIGH (ref 3.8–5.6)

## 2023-01-01 LAB — LIPID PANEL
Chol/HDL Ratio: 6 Ratio — ABNORMAL HIGH (ref 0.0–5.0)
Cholesterol, Total: 114 mg/dl (ref 0–199)
HDL: <20 mg/dl — ABNORMAL LOW (ref 40–60)
LDL Cholesterol: 70 mg/dl (ref 0–130)
Triglycerides: 127 mg/dl (ref 0–150)

## 2023-01-01 LAB — COMPREHENSIVE METABOLIC PANEL W/ REFLEX TO MG FOR LOW K
ALT: 27 U/L (ref 10–49)
AST: 32 U/L (ref 0.0–33.9)
Albumin: 3.1 gm/dl — ABNORMAL LOW (ref 3.4–5.0)
Alkaline Phosphatase: 129 U/L — ABNORMAL HIGH (ref 46–116)
Anion Gap: 13 mmol/L (ref 5–15)
BUN: 27 mg/dl — ABNORMAL HIGH (ref 9–23)
CO2: 22 mEq/L (ref 20–31)
Calcium: 9.2 mg/dl (ref 8.7–10.4)
Chloride: 100 mEq/L (ref 98–107)
Creatinine: 1.26 mg/dl (ref 0.70–1.30)
Glucose: 157 mg/dl — ABNORMAL HIGH (ref 74–106)
Potassium: 3.9 mEq/L (ref 3.5–5.1)
Sodium: 135 mEq/L — ABNORMAL LOW (ref 136–145)
Total Bilirubin: 1.4 mg/dl — ABNORMAL HIGH (ref 0.30–1.20)
Total Protein: 7.9 gm/dl (ref 5.7–8.2)

## 2023-01-01 LAB — EKG 12-LEAD
Atrial Rate: 96 {beats}/min
Calculated P Axis: 27 degrees
Calculated R Axis: 7 degrees
Calculated T Axis: 60 degrees
DIAGNOSIS, 93000: NORMAL
P-R Interval: 146 ms
Q-T Interval: 384 ms
QRS Duration: 106 ms
QTC Calculation (Bezet): 485 ms
Ventricular Rate: 96 {beats}/min

## 2023-01-01 LAB — PROTIME-INR
INR: 1.6 — ABNORMAL HIGH (ref 0.1–1.1)
Protime: 18.3 seconds — ABNORMAL HIGH (ref 10.2–12.9)

## 2023-01-01 LAB — POC GLUCOSE FINGERSTICK
POC Glucose: 149 mg/dL — ABNORMAL HIGH (ref 65–105)
POC Glucose: 180 mg/dL — ABNORMAL HIGH (ref 65–105)
POC Glucose: 137 mg/dL — ABNORMAL HIGH (ref 65–105)

## 2023-01-01 LAB — LACTATE DEHYDROGENASE: LD: 560 U/L — ABNORMAL HIGH (ref 120–246)

## 2023-01-01 LAB — LACTIC ACID: Lactate: 1.4 mmol/L (ref 0.5–2.2)

## 2023-01-01 MED ORDER — DOCUSATE SODIUM 100 MG PO CAPS
100 | Freq: Two times a day (BID) | ORAL | Status: DC
Start: 2023-01-01 — End: 2023-01-10
  Administered 2023-01-02 – 2023-01-10 (×10): 100 mg via ORAL

## 2023-01-01 MED ORDER — SENNOSIDES 8.6 MG PO TABS
8.6 | Freq: Every evening | ORAL | Status: DC
Start: 2023-01-01 — End: 2023-01-10
  Administered 2023-01-01 – 2023-01-10 (×5): 8.6 via ORAL

## 2023-01-01 MED ORDER — DOXYCYCLINE HYCLATE 100 MG IV SOLR
100 | Freq: Two times a day (BID) | INTRAVENOUS | Status: DC
Start: 2023-01-01 — End: 2023-01-01
  Administered 2023-01-01 (×2): 100 mg via INTRAVENOUS

## 2023-01-01 MED ORDER — NORMAL SALINE FLUSH 0.9 % IV SOLN
0.9 | INTRAVENOUS | Status: DC | PRN
Start: 2023-01-01 — End: 2023-01-10

## 2023-01-01 MED ORDER — MIDAZOLAM HCL 5 MG/5ML IJ SOLN
5 | INTRAMUSCULAR | Status: DC | PRN
Start: 2023-01-01 — End: 2023-01-01
  Administered 2023-01-01: 19:00:00 1 mg via INTRAVENOUS

## 2023-01-01 MED ORDER — ACETAMINOPHEN 650 MG RE SUPP
650 | Freq: Four times a day (QID) | RECTAL | Status: DC | PRN
Start: 2023-01-01 — End: 2023-01-10

## 2023-01-01 MED ORDER — FUROSEMIDE 10 MG/ML IJ SOLN
10 | Freq: Two times a day (BID) | INTRAMUSCULAR | Status: DC
Start: 2023-01-01 — End: 2023-01-07
  Administered 2023-01-01 – 2023-01-07 (×13): 40 mg via INTRAVENOUS

## 2023-01-01 MED ORDER — NICARDIPINE HCL IN NACL 20-0.9 MG/200ML-% IV SOLN
INTRAVENOUS | Status: DC
Start: 2023-01-01 — End: 2023-01-02
  Administered 2023-01-01: 13:00:00 2.5 mg/h via INTRAVENOUS

## 2023-01-01 MED ORDER — MIDAZOLAM HCL 2 MG/2ML IJ SOLN
2 MG/2ML | INTRAMUSCULAR | Status: AC
  Administered 2023-01-01: 19:00:00 1 via INTRAVENOUS

## 2023-01-01 MED ORDER — FENTANYL CITRATE (PF) 100 MCG/2ML IJ SOLN
100 MCG/2ML | INTRAMUSCULAR | Status: AC
  Administered 2023-01-01: 19:00:00 50 via INTRAVENOUS

## 2023-01-01 MED ORDER — METHYLPREDNISOLONE NA SUC (PF) 40 MG IJ SOLR
40 | Freq: Every day | INTRAMUSCULAR | Status: DC
Start: 2023-01-01 — End: 2022-12-31

## 2023-01-01 MED ORDER — NORMAL SALINE FLUSH 0.9 % IV SOLN
0.9 | Freq: Two times a day (BID) | INTRAVENOUS | Status: DC
Start: 2023-01-01 — End: 2023-01-10
  Administered 2023-01-01 – 2023-01-07 (×13): 10 mL via INTRAVENOUS
  Administered 2023-01-07: 15:00:00 5 mL via INTRAVENOUS
  Administered 2023-01-08 – 2023-01-10 (×5): 10 mL via INTRAVENOUS

## 2023-01-01 MED ORDER — ACETAMINOPHEN 325 MG PO TABS
325 | Freq: Four times a day (QID) | ORAL | Status: DC | PRN
Start: 2023-01-01 — End: 2023-01-10
  Administered 2023-01-02 – 2023-01-03 (×3): 650 mg via ORAL

## 2023-01-01 MED ORDER — LIDOCAINE 4 % EX PTCH
4 | Freq: Every day | CUTANEOUS | Status: DC
Start: 2023-01-01 — End: 2023-01-10
  Administered 2023-01-01 – 2023-01-10 (×7): 1 via TRANSDERMAL

## 2023-01-01 MED ORDER — POTASSIUM CHLORIDE CRYS ER 20 MEQ PO TBCR
20 | Freq: Every day | ORAL | Status: DC
Start: 2023-01-01 — End: 2023-01-10
  Administered 2023-01-01 – 2023-01-10 (×11): 20 meq via ORAL

## 2023-01-01 MED ORDER — CARVEDILOL 6.25 MG PO TABS
6.25 | Freq: Two times a day (BID) | ORAL | Status: DC
Start: 2023-01-01 — End: 2023-01-10
  Administered 2023-01-01 – 2023-01-10 (×19): 6.25 mg via ORAL

## 2023-01-01 MED ORDER — SODIUM CHLORIDE 0.9 % IV SOLN
0.9 % | INTRAVENOUS | Status: DC | PRN
Start: 2023-01-01 — End: 2023-01-10

## 2023-01-01 MED ORDER — POLYETHYLENE GLYCOL 3350 17 G PO PACK
17 | Freq: Every day | ORAL | Status: DC
Start: 2023-01-01 — End: 2023-01-10
  Administered 2023-01-01 – 2023-01-10 (×5): 17 g via ORAL

## 2023-01-01 MED ORDER — FENTANYL CITRATE (PF) 100 MCG/2ML IJ SOLN
100 | INTRAMUSCULAR | Status: DC | PRN
Start: 2023-01-01 — End: 2023-01-01
  Administered 2023-01-01: 19:00:00 50 ug via INTRAVENOUS

## 2023-01-01 MED FILL — CARDENE IV 20-0.86 MG/200ML-% IV SOLN: INTRAVENOUS | Qty: 200

## 2023-01-01 MED FILL — POLYETHYLENE GLYCOL 3350 17 G PO PACK: 17 g | ORAL | Qty: 1

## 2023-01-01 MED FILL — PIPERACILLIN SOD-TAZOBACTAM SO 4.5 (4-0.5) G IV SOLR: 4.5 (4-0.5) g | INTRAVENOUS | Qty: 4500

## 2023-01-01 MED FILL — GNP LIDOCAINE PAIN RELIEF 4 % EX PTCH: 4 % | CUTANEOUS | Qty: 1

## 2023-01-01 MED FILL — MIDAZOLAM HCL 2 MG/2ML IJ SOLN: 2 MG/ML | INTRAMUSCULAR | Qty: 2

## 2023-01-01 MED FILL — GERI-KOT 8.6 MG PO TABS: 8.6 MG | ORAL | Qty: 1

## 2023-01-01 MED FILL — POTASSIUM CHLORIDE CRYS ER 20 MEQ PO TBCR: 20 MEQ | ORAL | Qty: 1

## 2023-01-01 MED FILL — KLOR-CON M20 20 MEQ PO TBCR: 20 MEQ | ORAL | Qty: 1

## 2023-01-01 MED FILL — DOXY 100 100 MG IV SOLR: 100 MG | INTRAVENOUS | Qty: 100

## 2023-01-01 MED FILL — NICARDIPINE HCL IN NACL 20-0.9 MG/200ML-% IV SOLN: INTRAVENOUS | Qty: 200

## 2023-01-01 MED FILL — CARVEDILOL 6.25 MG PO TABS: 6.25 MG | ORAL | Qty: 1

## 2023-01-01 MED FILL — FUROSEMIDE 10 MG/ML IJ SOLN: 10 MG/ML | INTRAMUSCULAR | Qty: 4

## 2023-01-01 MED FILL — FENTANYL CITRATE (PF) 100 MCG/2ML IJ SOLN: 100 MCG/2ML | INTRAMUSCULAR | Qty: 4

## 2023-01-01 MED FILL — BD POSIFLUSH 0.9 % IV SOLN: 0.9 % | INTRAVENOUS | Qty: 40

## 2023-01-01 NOTE — Progress Notes (Signed)
Cardiology Progress Note      Patient: Thomas Browning Age: 57 y.o. Sex: male    Date of Birth: 1966-07-05 Admit Date: 12/31/2022 PCP: No primary care provider on file.   MRN: [id number]  CSN: [id number]        ASSESSMENT AND RECOMMENDATIONS:   57 year old gentleman with morbid obesity, nonalcoholic history who presented with progressively worsening dyspnea for 2 weeks.  He was noted to have leukocytosis, right pleural effusion and elevated BNP.       Dyspnea: Patient's presentation appears to be most consistent with sepsis with likely pneumonia (?emphysema ) as a source.  He has marked leukocytosis with left shift, likely recent sick contact and cough all suggestive more of infection rather than heart failure.  I do not think heart failure would explain patient's presentation.  Defer antibiotics to the primary team. Agree with thoracentesis/chest tube placement  Transthoracic echocardiogram     Prolonged QTc: Patient with QTc of 485 ms.  Anticipate that he will require antibiotics.  Avoid macrolides and fluoroquinolone if possible  Please repeat an EKG     INTERIM EVENTS:   Reports that his dyspnea symptoms have improved.  Denies any angina or palpitations.     PHYSICAL EXAM:     VS: BP 120/75   Pulse 83   Temp 97.3 F (36.3 C) (Temporal)   Resp 16   Ht 1.803 m (5\' 11" )   Wt (!) 154.3 kg (340 lb 2.7 oz)   SpO2 98%   BMI 47.44 kg/m   @WEIGHTLAST3IP @   Body mass index is 47.44 kg/m.     Intake/Output Summary (Last 24 hours) at 01/01/2023 1539  Last data filed at 01/01/2023 1307  Gross per 24 hour   Intake 350.33 ml   Output 840 ml   Net -489.67 ml       Physical examination:  GEN: alert, orientedx3, in respiratory distress  HEENT: PERRLA, mucous membranes are moist, No jugular venous distention or carotid bruit is noted  PULM: Decreased breath sounds over left lung base, no wheezing  CARD:  Normal S1/S2, no m/r/g  ABD:  Soft, non-tender, non-distended  NEURO:  No focal deficit, CN II-XII intact  EXT: 1+ edema of  the bilateral knees    CARDIOVASCULAR STUDIES:   ECG 12/31/22: NSR 96 bpm, iLBBB, prolonged QTc (485 ms)     LABS:     Basic Metabolic Profile   Lab Results   Component Value Date/Time    NA 135 (L) 01/01/2023 12:30 AM    NA 134 (L) 12/31/2022 02:33 PM    K 3.9 01/01/2023 12:30 AM    K 3.9 12/31/2022 02:33 PM    CL 100 01/01/2023 12:30 AM    CL 99 12/31/2022 02:33 PM    CO2 22 01/01/2023 12:30 AM    CO2 25 12/31/2022 02:33 PM    BUN 27 (H) 01/01/2023 12:30 AM    BUN 30 (H) 12/31/2022 02:33 PM    CREATININE 1.26 01/01/2023 12:30 AM    CREATININE 1.34 (H) 12/31/2022 02:33 PM   .       Lab Results   Component Value Date/Time    WBC 36.2 (H) 01/01/2023 12:30 AM    WBC 34.5 (H) 12/31/2022 02:33 PM    HGB 14.4 01/01/2023 12:30 AM    HGB 14.2 12/31/2022 02:33 PM    HCT 44.6 01/01/2023 12:30 AM    HCT 42.5 12/31/2022 02:33 PM    PLT 444 01/01/2023 12:30 AM  PLT 419 12/31/2022 02:33 PM         Cardiac Enzymes   No components found for: "3", "TROP"     Coagulation   Lab Results   Component Value Date/Time    INR 1.6 (H) 01/01/2023 12:30 AM    INR 1.7 (H) 12/31/2022 04:54 PM    APTT 34.6 12/31/2022 04:54 PM        Lipids/A1C   Lab Results   Component Value Date    LABA1C 5.8 (H) 12/31/2022       Lab Results   Component Value Date    CHOL 114 12/31/2022    HDL <20 (L) 12/31/2022    CHOLHDLRATIO 6.0 (H) 12/31/2022    TRIG 127 12/31/2022        MEDICATIONS:     Current Facility-Administered Medications   Medication Dose Route Frequency Provider Last Rate Last Admin    niCARdipine (CARDENE) 20 mg in 0.9 % sodium chloride 200 mL solution  2.5-15 mg/hr IntraVENous Continuous Hutchinson, Anne, DO   Stopped at 01/01/23 1340    fentaNYL (SUBLIMAZE) injection 200 mcg  200 mcg IntraVENous PRN Odelia Gage III, MD   50 mcg at 01/01/23 1446    midazolam (VERSED) injection 4 mg  4 mg IntraVENous PRN Odelia Gage III, MD   1 mg at 01/01/23 1446    docusate sodium (COLACE) capsule 100 mg  100 mg Oral BID Odelia Gage III, MD         piperacillin-tazobactam (ZOSYN) 4,500 mg in sodium chloride 0.9 % 100 mL IVPB (mini-bag)  4,500 mg IntraVENous q8h Richarda Overlie, DO   Stopped at 01/01/23 1515    benzonatate (TESSALON) capsule 100 mg  100 mg Oral TID PRN Richarda Overlie, DO        albuterol (ACCUNEB) nebulizer solution 1.25 mg  1.25 mg Nebulization Q6H PRN Hutchinson, Anne, DO        nystatin (MYCOSTATIN) powder   Topical BID Richarda Overlie, DO   Given at 12/31/22 1939    senna (SENOKOT) tablet 8.6 mg  1 tablet Oral Nightly Hutchinson, Anne, DO   8.6 mg at 12/31/22 2246    polyethylene glycol (GLYCOLAX) packet 17 g  17 g Oral Daily Richarda Overlie, DO   17 g at 01/01/23 1610    furosemide (LASIX) injection 40 mg  40 mg IntraVENous BID Richarda Overlie, DO   40 mg at 01/01/23 9604    potassium chloride (KLOR-CON M) extended release tablet 20 mEq  20 mEq Oral Daily Richarda Overlie, DO   20 mEq at 01/01/23 5409    carvedilol (COREG) tablet 6.25 mg  6.25 mg Oral BID WC Hutchinson, Anne, DO   6.25 mg at 01/01/23 8119    sodium chloride flush 0.9 % injection 5-40 mL  5-40 mL IntraVENous 2 times per day Richarda Overlie, DO   10 mL at 01/01/23 0943    sodium chloride flush 0.9 % injection 5-40 mL  5-40 mL IntraVENous PRN Hutchinson, Anne, DO        0.9 % sodium chloride infusion   IntraVENous PRN Hutchinson, Anne, DO        acetaminophen (TYLENOL) tablet 650 mg  650 mg Oral Q6H PRN Richarda Overlie, DO        Or    acetaminophen (TYLENOL) suppository 650 mg  650 mg Rectal Q6H PRN Hutchinson, Anne, DO        lidocaine 4 % external patch 1 patch  1 patch TransDERmal Daily  Richarda Overlie, DO   1 patch at 01/01/23 1610       Thank you for involving [place] in Mr. Inglis's care.  We will continue to follow.  Please call with any questions or concerns.    Isley Weisheit Williams Che, MD, [facility], RPVI, [facility]  Interventional Cardiologist  [facility]  Pager: [phone]

## 2023-01-01 NOTE — Consults (Signed)
[facility]   (A Division of [facility])    Consultation Note    Date of Admission: 12/31/2022                Date of Note: 01/01/23       Reason for Referral:      Current Antimicrobials:    Prior Antimicrobials:  Vanc, Zosyn 5/26 - 1  Doxycycline 5/26 - 1      Assessment:  Rec / Plan:   Right Pleural effusion  - likely empyema: two weeks worsening cough, some mild chills, sweats, no fever, + hoarseness  - WBC count 34.5  - CXR 5/26:  mod to large R loculated pleural effusion w/ atelectasis in underlying lung  - refused CT placement in ER -> CT guided chest tube placement by IR today  -> dc vancomycin, Zosyn  -> dc Doxycycline  -> await pleural fluid cultures.  Hoping to streamline to Ceftriaxone alone if no MRSA or Pseudomonas   Morbid Obesity    HTN    Seasonal allergies      HPI:  [name] with HTN, seasonal allergies who works driving for non-emergency medical transport admitted to [facility] 12/31/2022 due to progressive shortness of breath.  He has chronic "allergic" symptoms of rhinorrhea, cough that he uses over-the-counter allergy medications for.  Around two weeks prior to admission he noticed worsening of his cough with shortness of breath.  The cough was productive of light to dark-brown sputum, occasionally blood-tinged.  He had no fever but had occasional mild chills and non-drenching sweats. He developed hoarseness which worsened around one week prior to admission and his dyspnea worsened leading to his presentation at the ER 5/26.  He was afebrile but WBC count was 34.5 and CXR showed a moderated to large right loculated pleural effusion with atelectasis in the underlying lung.  The left lung is clear.  Acute Respiratory Pathogen Panel was negative.  He was seen by Dr. Effie Shy of Cardiothoracic Surgery who offered Chest tube placement in ED but the patient declined so CT guided Chest tube placement was scheduled for placement today.   Vancomycin, Piperacillin-tazobactam and Doxycycline.    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  He has no known exposure to TB, does not keep pet birds, is not exposed to poultry or other livestock.  He does not smoke cigarettes or vape but admits to occasional "weed".  Denies IV drug use.       Past Medical History:   Diagnosis Date    HTN (hypertension)     Morbid obesity (HCC)         History reviewed. No pertinent surgical history.     Current Facility-Administered Medications   Medication Dose Route Frequency Provider Last Rate Last Admin    niCARdipine (CARDENE) 20 mg in 0.9 % sodium chloride 200 mL solution  2.5-15 mg/hr IntraVENous Continuous Hutchinson, Anne, DO 25 mL/hr at 01/01/23 0928 2.5 mg/hr at 01/01/23 0928    piperacillin-tazobactam (ZOSYN) 4,500 mg in sodium chloride 0.9 % 100 mL IVPB (mini-bag)  4,500 mg IntraVENous q8h Richarda Overlie, DO   Stopped at 01/01/23 0404    iopamidol (ISOVUE-300) 61 % injection 80 mL  80 mL IntraVENous ONCE PRN Hutchinson, Anne, DO        benzonatate (TESSALON) capsule 100 mg  100 mg Oral TID PRN Richarda Overlie, DO        albuterol (ACCUNEB) nebulizer  solution 1.25 mg  1.25 mg Nebulization Q6H PRN Hutchinson, Anne, DO        nystatin (MYCOSTATIN) powder   Topical BID Richarda Overlie, DO   Given at 12/31/22 1939    senna (SENOKOT) tablet 8.6 mg  1 tablet Oral Nightly Hutchinson, Anne, DO   8.6 mg at 12/31/22 2246    polyethylene glycol (GLYCOLAX) packet 17 g  17 g Oral Daily Richarda Overlie, DO   17 g at 01/01/23 1610    doxycycline (VIBRAMYCIN) 100 mg in sodium chloride 0.9 % 100 mL IVPB (mini-bag)  100 mg IntraVENous Q12H Richarda Overlie, DO 100 mL/hr at 01/01/23 0927 100 mg at 01/01/23 9604    furosemide (LASIX) injection 40 mg  40 mg IntraVENous BID Richarda Overlie, DO   40 mg at 01/01/23 5409    potassium chloride (KLOR-CON M) extended release tablet 20 mEq  20 mEq Oral Daily Richarda Overlie, DO   20 mEq at 01/01/23 8119     carvedilol (COREG) tablet 6.25 mg  6.25 mg Oral BID WC Hutchinson, Anne, DO   6.25 mg at 01/01/23 1478    sodium chloride flush 0.9 % injection 5-40 mL  5-40 mL IntraVENous 2 times per day Richarda Overlie, DO   10 mL at 01/01/23 0943    sodium chloride flush 0.9 % injection 5-40 mL  5-40 mL IntraVENous PRN Hutchinson, Anne, DO        0.9 % sodium chloride infusion   IntraVENous PRN Hutchinson, Anne, DO        acetaminophen (TYLENOL) tablet 650 mg  650 mg Oral Q6H PRN Richarda Overlie, DO        Or    acetaminophen (TYLENOL) suppository 650 mg  650 mg Rectal Q6H PRN Hutchinson, Anne, DO        lidocaine 4 % external patch 1 patch  1 patch TransDERmal Daily Hutchinson, Anne, DO   1 patch at 01/01/23 2956        Review of Systems   HENT:  Positive for sneezing and voice change.    Eyes: Negative.    Respiratory:  Positive for cough and shortness of breath.    Cardiovascular: Negative.    Gastrointestinal: Negative.    Endocrine: Negative.    Genitourinary: Negative.    Musculoskeletal: Negative.    Skin: Negative.    Allergic/Immunologic: Negative.    Neurological: Negative.    Hematological: Negative.    Psychiatric/Behavioral: Negative.          Vitals:    01/01/23 0928   BP: (!) 162/97   Pulse:    Resp:    Temp:    SpO2:         Physical Exam  Constitutional:       General: He is not in acute distress.     Appearance: He is obese. He is ill-appearing. He is not toxic-appearing.   HENT:      Head: Normocephalic and atraumatic.      Nose: Nose normal.      Mouth/Throat:      Pharynx: Oropharynx is clear.   Eyes:      Extraocular Movements: Extraocular movements intact.      Conjunctiva/sclera: Conjunctivae normal.   Cardiovascular:      Rate and Rhythm: Regular rhythm.      Heart sounds: No murmur heard.     No gallop.   Pulmonary:      Comments: Decreased breath sounds right base, egophony right midlung.  No crackles  or wheezes  Abdominal:      Palpations: Abdomen is soft.      Tenderness: There is no abdominal  tenderness.   Musculoskeletal:      Cervical back: Neck supple.      Right lower leg: No edema.      Left lower leg: No edema.   Skin:     Findings: No rash.   Neurological:      Mental Status: He is alert and oriented to person, place, and time.   Psychiatric:         Mood and Affect: Mood normal.         Behavior: Behavior normal.         Recent Labs     01/01/23  0030 12/31/22  1433   WBC 36.2* 34.5*   HGB 14.4 14.2   HCT 44.6 42.5   MCV 87.3 86.6   PLT 444 419       Recent Labs     01/01/23  0030 12/31/22  1433   BUN 27* 30*   CREATININE 1.26 1.34*       Results       Procedure Component Value Units Date/Time    Culture, Body Fluid [[id number]]     Order Status: No result Specimen: Body Fluid from Pleural     Culture, Respiratory [[id number]]  (Abnormal) Collected: 12/31/22 1907    Order Status: Completed Specimen: RESPIRATORY Updated: 01/01/23 1043     Gram Stain Result       <10 Epithelial cells/lpf  >25 WBC's/lpf  Rare Gram Positive Cocci  Rare Gram Variable Bacilli       Culture Result       Moderate Commensal Flora Isolated After 24 Hours          Culture, Urine [[id number]]  (Abnormal) Collected: 12/31/22 1730    Order Status: Completed Specimen: Urine Updated: 01/01/23 0913     Culture Result       Culture In Progress, Daily Updates To Follow          Legionella antigen, urine [[id number]] Collected: 12/31/22 1656    Order Status: Completed Specimen: Urine Updated: 12/31/22 1746     Legionella Urinary Ag NEGATIVE        Comment: Presumptive negative for L. pneumophila serogroup 1 antigen in urine, suggesting no recent or  current infection. Infection due to Legionella cannot be ruled out since other serogroups and  species may cause disease, antigen may not be present in urine in early infection and the level  of antigen present in the urine may be below the detection limit of the test.           Strep Pneumoniae Antigen [[id number]] Collected: 12/31/22 1656    Order Status: Completed Specimen: Urine  Updated: 12/31/22 1746     STREP PNEUMONIAE ANTIGEN, URINE NEGATIVE        Comment: Presumptive negative for pneumococcal pneumonia, suggesting no current or recent pneumococcal  infection.  Infection due to S. pneumoniae cannot be ruled out since the antigen present in the  sample may be below the detection limit of the test.         Blood Culture 1 [[id number]] Collected: 12/31/22 1637    Order Status: Completed Specimen: Blood Updated: 01/01/23 0738     BLOOD CULTURE RESULT       Culture In Progress, Daily Updates To Follow          Blood Culture 2 [[id number]] Collected: 12/31/22 1637  Order Status: Completed Specimen: Blood Updated: 01/01/23 0739     BLOOD CULTURE RESULT       Culture In Progress, Daily Updates To Follow          Respiratory Pathogen Panel [[id number]] Collected: 12/31/22 1545    Order Status: Completed Specimen: Nasopharyngeal Swab Updated: 12/31/22 1940     Adenovirus NOT DETECTED        Coronavirus 299E NOT DETECTED        Coronavirus HKU1 NOT DETECTED        Coronavirus NL63 NOT DETECTED        Coronavirus OC43 NOT DETECTED        Human Metapneumovirus NOT DETECTED        INFLUENZA A H1 NOT DETECTED        Influenza H1N1 NOT DETECTED        Influenza A PCR NOT DETECTED        Influenza B PCR NOT DETECTED        Influenza A H3 NOT DETECTED        Parainfluenza 1 NOT DETECTED        Parainfluenza 2 NOT DETECTED        Parainfluenza 3 NOT DETECTED        Parainfluenza NOT DETECTED        Rhinovirus/Enterovirus NOT DETECTED        RSV PCR NOT DETECTED        Bordetella Pertussis NOT DETECTED        Chlamydophilia pneumoniae NOT DETECTED        Mycoplasma pneumoniae NOT DETECTED        SARS-CoV-2 NOT DETECTED        Comment: A Not Detected (negative) test result for this test means that SARS- CoV-2 RNA was not present  in the specimen above the limit of detection. However, a negative result does not rule out the  possibility of COVID-19 and should not be used as the sole basis for treatment or  patient  management decisions.   This test for SARS-CoV2 has been authorized by FDA under an Emergency Use Authorization (EUA).         Rapid Strep Screen [[id number]] Collected: 12/31/22 1545    Order Status: Completed Specimen: Throat Updated: 12/31/22 1747     Strep A Ag       Negative - No Streptococcus Group A DNA was Detected.           Comment: NEGATIVE Streptococcus group A PCR do NOT REFLEX to culture since PCR is very sensitive (equal  to culture for Strep. group A), however, if pharyngitis is suspected by another beta hemolytic  Streptococcus group then you must order a throat culture. Call microbiology ext [phone] if culture  is needed to be added on for testing.         MRSA by PCR [[id number]] Collected: 12/31/22 1545    Order Status: Completed Specimen: Nares Updated: 12/31/22 1852     MRSA PCR screen NEGATIVE                  Jerilynn Som, MD, FIDSA  [facility]

## 2023-01-01 NOTE — Progress Notes (Signed)
THORACIC SURGERY PROGRESS NOTE  Progress Note    Patient: Thomas Browning               Sex: male          DOA: 12/31/2022       Date of Birth:  Jul 26, 1966      Age:  57 y.o.        LOS:  LOS: 1 day     Chart reviewed.    Assessment and Plan:   Right pleural effusion vs empyema  2.   Hypertension    -Right CT guided chest tube placement with Interventional Radiology today.  -NPO for IR procedure.  -Supplemental O2 as needed.  -Encourage patient to be OOB for meals.  -Encourage ambulation 4-5 times daily.  -IV antibiotics per ID/primary team. Dr Effie Shy consulted Dr. Brayton Mars (ID).  -BP control per primary team.  -Patient seen with Dr Effie Shy.      Problem List:  Principal Problem:    Pleural effusion on right  Active Problems:    SOB (shortness of breath)  Resolved Problems:    * No resolved hospital problems. *            Subjective:    Patient seen and examined on rounds today.  I have independently reviewed all pertinent labs and radiologic films.  Overnight patient admitted to SDU, has been on and off Cardene for BP control overnight.  Receiving IV antibiotics (Zosyn (day #2)/Doxy (day#2) for pneumonia coverage, and Lasix for diuresis.  NPO since 0500 awaiting right CT guided chest tube placement with IR this morning.      Objective:     Vital Signs:  BP (!) 148/85   Pulse 96   Temp 97.1 F (36.2 C) (Temporal)   Resp 24   Ht 1.803 m (5\' 11" )   Wt (!) 154.3 kg (340 lb 2.7 oz)   SpO2 96%   BMI 47.44 kg/m   Temp (24hrs), Avg:97.5 F (36.4 C), Min:97.1 F (36.2 C), Max:97.8 F (36.6 C)    Admission Weight: Last Weight   Weight - Scale: (!) 149.7 kg (330 lb) Weight - Scale: (!) 154.3 kg (340 lb 2.7 oz)       Physical Examination:     General:  Awake, alert, oriented to person, place, time and situation.  Voice weak/hoarse.  Lungs: Right lower lung field with decreased breath sounds.  Patient on supplemental O2.  Heart:  S1/S2 audible, regular rate, regular rhythm.   Abdomen: Obese, soft, non-tender to  palpation.  Extremities: Warm and well perfused.  Edema - mild.    Chest Tubes: N/A      Intake and Output:  Last three shifts:  05/25 1901 - 05/27 0700  In: 10 [I.V.:10]  Out: 840 [Urine:840]    Lab Results:  Lab Results   Component Value Date/Time    WBC 36.2 (H) 01/01/2023 12:30 AM    WBC 34.5 (H) 12/31/2022 02:33 PM    HCT 44.6 01/01/2023 12:30 AM    HCT 42.5 12/31/2022 02:33 PM    PLT 444 01/01/2023 12:30 AM      Lab Results   Component Value Date/Time    NA 135 (L) 01/01/2023 12:30 AM    K 3.9 01/01/2023 12:30 AM    CL 100 01/01/2023 12:30 AM    CO2 22 01/01/2023 12:30 AM    GLUCOSE 157 (H) 01/01/2023 12:30 AM    BUN 27 (H) 01/01/2023 12:30 AM  Recent Days:  Recent Labs     12/31/22  1433 01/01/23  0030   WBC 34.5* 36.2*   HGB 14.2 14.4   HCT 42.5 44.6   PLT 419 444     Recent Labs     12/31/22  1433 12/31/22  1654 01/01/23  0030   NA 134*  --  135*   K 3.9  --  3.9   CL 99  --  100   CO2 25  --  22   GLUCOSE 113*  --  157*   BUN 30*  --  27*   CREATININE 1.34*  --  1.26   ALT 27  --  27   INR  --  1.7* 1.6*     Recent Labs     01/01/23  0038   PH 7.362   PCO2 42.6   PO2 102.0*   HCO3 24.2       Results       Procedure Component Value Units Date/Time    Culture, Body Fluid [[id number]]     Order Status: No result Specimen: Body Fluid from Pleural     Culture, Respiratory [[id number]]  (Abnormal) Collected: 12/31/22 1907    Order Status: Completed Specimen: RESPIRATORY Updated: 12/31/22 2136     Gram Stain Result       <10 Epithelial cells/lpf  >25 WBC's/lpf  Rare Gram Positive Cocci  Rare Gram Variable Bacilli      Culture, Urine [[id number]] Collected: 12/31/22 1730    Order Status: Sent Specimen: Urine Updated: 12/31/22 2109    Legionella antigen, urine [[id number]] Collected: 12/31/22 1656    Order Status: Completed Specimen: Urine Updated: 12/31/22 1746     Legionella Urinary Ag NEGATIVE        Comment: Presumptive negative for L. pneumophila serogroup 1 antigen in urine, suggesting no recent  or  current infection. Infection due to Legionella cannot be ruled out since other serogroups and  species may cause disease, antigen may not be present in urine in early infection and the level  of antigen present in the urine may be below the detection limit of the test.           Strep Pneumoniae Antigen [[id number]] Collected: 12/31/22 1656    Order Status: Completed Specimen: Urine Updated: 12/31/22 1746     STREP PNEUMONIAE ANTIGEN, URINE NEGATIVE        Comment: Presumptive negative for pneumococcal pneumonia, suggesting no current or recent pneumococcal  infection.  Infection due to S. pneumoniae cannot be ruled out since the antigen present in the  sample may be below the detection limit of the test.         Blood Culture 1 [[id number]] Collected: 12/31/22 1637    Order Status: Completed Specimen: Blood Updated: 01/01/23 0738     BLOOD CULTURE RESULT       Culture In Progress, Daily Updates To Follow          Blood Culture 2 [[id number]] Collected: 12/31/22 1637    Order Status: Completed Specimen: Blood Updated: 01/01/23 0739     BLOOD CULTURE RESULT       Culture In Progress, Daily Updates To Follow          Respiratory Pathogen Panel [[id number]] Collected: 12/31/22 1545    Order Status: Completed Specimen: Nasopharyngeal Swab Updated: 12/31/22 1940     Adenovirus NOT DETECTED        Coronavirus 299E NOT DETECTED        Coronavirus  HKU1 NOT DETECTED        Coronavirus NL63 NOT DETECTED        Coronavirus OC43 NOT DETECTED        Human Metapneumovirus NOT DETECTED        INFLUENZA A H1 NOT DETECTED        Influenza H1N1 NOT DETECTED        Influenza A PCR NOT DETECTED        Influenza B PCR NOT DETECTED        Influenza A H3 NOT DETECTED        Parainfluenza 1 NOT DETECTED        Parainfluenza 2 NOT DETECTED        Parainfluenza 3 NOT DETECTED        Parainfluenza NOT DETECTED        Rhinovirus/Enterovirus NOT DETECTED        RSV PCR NOT DETECTED        Bordetella Pertussis NOT DETECTED         Chlamydophilia pneumoniae NOT DETECTED        Mycoplasma pneumoniae NOT DETECTED        SARS-CoV-2 NOT DETECTED        Comment: A Not Detected (negative) test result for this test means that SARS- CoV-2 RNA was not present  in the specimen above the limit of detection. However, a negative result does not rule out the  possibility of COVID-19 and should not be used as the sole basis for treatment or patient  management decisions.   This test for SARS-CoV2 has been authorized by FDA under an Emergency Use Authorization (EUA).         Rapid Strep Screen [[id number]] Collected: 12/31/22 1545    Order Status: Completed Specimen: Throat Updated: 12/31/22 1747     Strep A Ag       Negative - No Streptococcus Group A DNA was Detected.           Comment: NEGATIVE Streptococcus group A PCR do NOT REFLEX to culture since PCR is very sensitive (equal  to culture for Strep. group A), however, if pharyngitis is suspected by another beta hemolytic  Streptococcus group then you must order a throat culture. Call microbiology ext [phone] if culture  is needed to be added on for testing.         MRSA by PCR [[id number]] Collected: 12/31/22 1545    Order Status: Completed Specimen: Nares Updated: 12/31/22 1852     MRSA PCR screen NEGATIVE                RADIOLOGY:    XR CHEST PORTABLE  Narrative: Indication: SOB. .  Impression: IMPRESSION: Portable AP upright view the chest exposed at 2:48 PM Dec 31, 2022  reveals a moderate to large right loculated pleural effusion with atelectasis in  the underlying lung..  The left lung is clear and the heart is of normal size.  No prior studies for comparison.    Electronically signed by: Dirk Dress III, MD 12/31/2022 2:57 PM EDT            Workstation ID: [id number]      Home Medications:  None        Current Medications:  Current Facility-Administered Medications   Medication Dose Route Frequency    piperacillin-tazobactam (ZOSYN) 4,500 mg in sodium chloride 0.9 % 100 mL IVPB (mini-bag)  4,500 mg  IntraVENous q8h    iopamidol (ISOVUE-300) 61 % injection 80 mL  80 mL IntraVENous ONCE PRN    benzonatate (TESSALON) capsule 100 mg  100 mg Oral TID PRN    albuterol (ACCUNEB) nebulizer solution 1.25 mg  1.25 mg Nebulization Q6H PRN    nystatin (MYCOSTATIN) powder   Topical BID    niCARdipine (CARDENE) 20 mg in 0.86 % sodium chloride 200 mL infusion  2.5-15 mg/hr IntraVENous Continuous    senna (SENOKOT) tablet 8.6 mg  1 tablet Oral Nightly    polyethylene glycol (GLYCOLAX) packet 17 g  17 g Oral Daily    doxycycline (VIBRAMYCIN) 100 mg in sodium chloride 0.9 % 100 mL IVPB (mini-bag)  100 mg IntraVENous Q12H    furosemide (LASIX) injection 40 mg  40 mg IntraVENous BID    potassium chloride (KLOR-CON M) extended release tablet 20 mEq  20 mEq Oral Daily    carvedilol (COREG) tablet 6.25 mg  6.25 mg Oral BID WC    sodium chloride flush 0.9 % injection 5-40 mL  5-40 mL IntraVENous 2 times per day    sodium chloride flush 0.9 % injection 5-40 mL  5-40 mL IntraVENous PRN    0.9 % sodium chloride infusion   IntraVENous PRN    acetaminophen (TYLENOL) tablet 650 mg  650 mg Oral Q6H PRN    Or    acetaminophen (TYLENOL) suppository 650 mg  650 mg Rectal Q6H PRN    lidocaine 4 % external patch 1 patch  1 patch TransDERmal Daily       This note was generated with the aid of a dictation system.  Unintentional errors may be present.  Felix Pacini, PA-C  Jan 01, 2023  8:46 AM

## 2023-01-01 NOTE — Progress Notes (Addendum)
Medical Progress Note      NAME: Thomas Browning   DOB:  1966/01/16  MRN:             [id number]    Date:  01/01/2023        Assessment:     Large loculated right pleural effusion  Acute hypoxic respiratory failure  Acute on chronic systolic, diastolic  Sepsis, suspect respiratory source  Hypertensive urgency  Prolonged QTc  DM 2,   Morbid obesity    Plan:     Monitor respiratory status  Continue supplemental oxygen and titrate as needed  Patient is scheduled for chest tube placement by IR  Thoracic surgery was also consulted and following  Patient was started on IV antibiotics and ID was consulted  Monitor hemodynamic status  Monitor renal function and electrolytes, replete as needed  Monitor blood glucose and continue insulin per Glucomander  Patient has full CODE STATUS  DVT prophylaxis  Discussed with the patient, RN and Dr. Jerilynn Som  Total critical care time 36 minutes    Subjective:     Shortness of breathing, no fever or chest pain    Objective:     Vitals:    Last 24hrs VS reviewed since prior progress note. Most recent are:  Vitals:    01/01/23 1158   BP:    Pulse:    Resp:    Temp: 97.3 F (36.3 C)   SpO2:      SpO2 Readings from Last 6 Encounters:   01/01/23 96%          Intake/Output Summary (Last 24 hours) at 01/01/2023 1314  Last data filed at 01/01/2023 1307  Gross per 24 hour   Intake 350.33 ml   Output 840 ml   Net -489.67 ml        Exam:     General:   Not in acute distress  HEENT: PERRLA, Neck Supple,  No JVD  Respiratory:   CTA bilaterally-no wheezes, rales, rhonchi, or crackles  Cardiac:  Regular Rate and Rythmn  - no murmurs, rubs or gallops  Abdominal:  Soft, non-tender, non-distended, positive bowel sounds  Extremities:  No cyanosis, or edema.  Skin: No rash  Neurological:  No focal neurological deficits    Medication:   Current Medications Reviewed    Current Facility-Administered Medications:     niCARdipine (CARDENE) 20 mg in 0.9 % sodium chloride 200 mL solution, 2.5-15 mg/hr, IntraVENous,  Continuous, Hutchinson, Anne, DO, Last Rate: 25 mL/hr at 01/01/23 0928, 2.5 mg/hr at 01/01/23 0928    piperacillin-tazobactam (ZOSYN) 4,500 mg in sodium chloride 0.9 % 100 mL IVPB (mini-bag), 4,500 mg, IntraVENous, q8h, Hutchinson, Anne, DO, Last Rate: 25 mL/hr at 01/01/23 1111, 4,500 mg at 01/01/23 1111    iopamidol (ISOVUE-300) 61 % injection 80 mL, 80 mL, IntraVENous, ONCE PRN, Hutchinson, Anne, DO    benzonatate (TESSALON) capsule 100 mg, 100 mg, Oral, TID PRN, Hutchinson, Anne, DO    albuterol (ACCUNEB) nebulizer solution 1.25 mg, 1.25 mg, Nebulization, Q6H PRN, Hutchinson, Anne, DO    nystatin (MYCOSTATIN) powder, , Topical, BID, Hutchinson, Anne, DO, Given at 12/31/22 1939    senna (SENOKOT) tablet 8.6 mg, 1 tablet, Oral, Nightly, Hutchinson, Anne, DO, 8.6 mg at 12/31/22 2246    polyethylene glycol (GLYCOLAX) packet 17 g, 17 g, Oral, Daily, Hutchinson, Anne, DO, 17 g at 01/01/23 0925    furosemide (LASIX) injection 40 mg, 40 mg, IntraVENous, BID, Hutchinson, Anne, DO, 40 mg at 01/01/23 [phone]  potassium chloride (KLOR-CON M) extended release tablet 20 mEq, 20 mEq, Oral, Daily, Hutchinson, Anne, DO, 20 mEq at 01/01/23 0925    carvedilol (COREG) tablet 6.25 mg, 6.25 mg, Oral, BID WC, Hutchinson, Anne, DO, 6.25 mg at 01/01/23 7829    sodium chloride flush 0.9 % injection 5-40 mL, 5-40 mL, IntraVENous, 2 times per day, Hutchinson, Anne, DO, 10 mL at 01/01/23 0943    sodium chloride flush 0.9 % injection 5-40 mL, 5-40 mL, IntraVENous, PRN, Hutchinson, Anne, DO    0.9 % sodium chloride infusion, , IntraVENous, PRN, Hutchinson, Anne, DO    acetaminophen (TYLENOL) tablet 650 mg, 650 mg, Oral, Q6H PRN **OR** acetaminophen (TYLENOL) suppository 650 mg, 650 mg, Rectal, Q6H PRN, Hutchinson, Anne, DO    lidocaine 4 % external patch 1 patch, 1 patch, TransDERmal, Daily, Hutchinson, Anne, DO, 1 patch at 01/01/23 5621      Lab:     Lab Data Reviewed: (see below)  Recent Results (from the past 24 hour(s))   EKG 12 Lead     Collection Time: 12/31/22  2:21 PM   Result Value Ref Range    Ventricular Rate 96 BPM    Atrial Rate 96 BPM    P-R Interval 146 ms    QRS Duration 106 ms    Q-T Interval 384 ms    QTC Calculation (Bezet) 485 ms    Calculated P Axis 27 degrees    Calculated R Axis 7 degrees    Calculated T Axis 60 degrees    DIAGNOSIS, 93000       Normal sinus rhythm  Incomplete left bundle branch block  Minimal voltage criteria for LVH, may be normal variant ( [employer] product )  Prolonged QT  Abnormal ECG  No previous ECGs available  Confirmed by Arrey-Mbi, M.D., [place] ([id number]) on 01/01/2023 7:21:44 AM     CBC with Auto Differential    Collection Time: 12/31/22  2:33 PM   Result Value Ref Range    WBC 34.5 (H) 4.0 - 11.0 1000/mm3    RBC 4.91 3.80 - 5.70 M/uL    Hemoglobin 14.2 13.0 - 18.0 gm/dl    Hematocrit 30.8 65.7 - 55.0 %    MCV 86.6 80.0 - 98.0 fL    MCH 28.9 23.0 - 34.6 pg    MCHC 33.4 30.0 - 36.0 gm/dl    Platelets 846 962 - 450 1000/mm3    MPV 9.4 6.0 - 10.0 fL    RDW 44.9 (H) 35.1 - 43.9      Nucleated RBCs 0 0 - 0      Immature Granulocytes % 9.2 (HH) 0.0 - 3.0 %    Neutrophils Segmented 81.0 (H) 34 - 64 %    Lymphocytes 7.0 (L) 28 - 48 %    Atypical Lymphocytes 2.0 (H) 0 - 0 %    Monocytes 8.0 1 - 13 %    Myelocytes 2.0 (H) 0 - 0 %    RBC Morphology NORMAL      Platelet Appearance NORMAL     CMP    Collection Time: 12/31/22  2:33 PM   Result Value Ref Range    Potassium 3.9 3.5 - 5.1 mEq/L    Chloride 99 98 - 107 mEq/L    Sodium 134 (L) 136 - 145 mEq/L    CO2 25 20 - 31 mEq/L    Glucose 113 (H) 74 - 106 mg/dl    BUN 30 (H) 9 - 23 mg/dl  Creatinine 1.34 (H) 0.70 - 1.30 mg/dl    GFR African American >60      GFR Non-African American 58      Calcium 9.2 8.7 - 10.4 mg/dl    Anion Gap 9 5 - 15 mmol/L    AST 34.0 (H) 0.0 - 33.9 U/L    ALT 27 10 - 49 U/L    Alkaline Phosphatase 120 (H) 46 - 116 U/L    Total Bilirubin 1.60 (H) 0.30 - 1.20 mg/dl    Total Protein 7.7 5.7 - 8.2 gm/dl    Albumin 3.0 (L) 3.4 - 5.0 gm/dl   proBNP,  N-TERMINAL    Collection Time: 12/31/22  2:33 PM   Result Value Ref Range    NT Pro-BNP 1,729 (H) 0 - 125     Troponin    Collection Time: 12/31/22  2:33 PM   Result Value Ref Range    Troponin, High Sensitivity 27 0 - 53 ng/L   Lipid Panel    Collection Time: 12/31/22  2:33 PM   Result Value Ref Range    Cholesterol, Total 114 0 - 199 mg/dl    HDL <16 (L) 40 - 60 mg/dl    Triglycerides 109 0 - 150 mg/dl    LDL Cholesterol 70 0 - 130 mg/dl    Chol/HDL Ratio 6.0 (H) 0.0 - 5.0 Ratio   Hemoglobin A1C    Collection Time: 12/31/22  2:33 PM   Result Value Ref Range    Hemoglobin A1C 5.8 (H) 3.8 - 5.6 %   Lactate Dehydrogenase    Collection Time: 12/31/22  2:33 PM   Result Value Ref Range    LD 560 (H) 120 - 246 U/L   Respiratory Pathogen Panel    Collection Time: 12/31/22  3:45 PM    Specimen: Nasopharyngeal Swab   Result Value Ref Range    Adenovirus NOT DETECTED NOT DETECTED      Coronavirus 299E NOT DETECTED NOT DETECTED      Coronavirus HKU1 NOT DETECTED NOT DETECTED      Coronavirus NL63 NOT DETECTED NOT DETECTED      Coronavirus OC43 NOT DETECTED NOT DETECTED      Human Metapneumovirus NOT DETECTED NOT DETECTED      INFLUENZA A H1 NOT DETECTED NOT DETECTED      Influenza H1N1 NOT DETECTED NOT DETECTED      Influenza A PCR NOT DETECTED NOT DETECTED      Influenza B PCR NOT DETECTED NOT DETECTED      Influenza A H3 NOT DETECTED NOT DETECTED      Parainfluenza 1 NOT DETECTED NOT DETECTED      Parainfluenza 2 NOT DETECTED NOT DETECTED      Parainfluenza 3 NOT DETECTED NOT DETECTED      Parainfluenza NOT DETECTED NOT DETECTED      Rhinovirus/Enterovirus NOT DETECTED NOT DETECTED      RSV PCR NOT DETECTED NOT DETECTED      Bordetella Pertussis NOT DETECTED NOT DETECTED      Chlamydophilia pneumoniae NOT DETECTED NOT DETECTED      Mycoplasma pneumoniae NOT DETECTED NOT DETECTED      SARS-CoV-2 NOT DETECTED NOT DETECTED     Rapid Strep Screen    Collection Time: 12/31/22  3:45 PM    Specimen: Throat   Result Value Ref Range     Strep A Ag  Negative - No Streptococcus Group A DNA was Detected.       Negative -  No Streptococcus Group A DNA was Detected.   MRSA by PCR    Collection Time: 12/31/22  3:45 PM    Specimen: Nares   Result Value Ref Range    MRSA PCR screen NEGATIVE NEGATIVE     Blood Culture 1    Collection Time: 12/31/22  4:37 PM    Specimen: Blood   Result Value Ref Range    BLOOD CULTURE RESULT Culture In Progress, Daily Updates To Follow     Blood Culture 2    Collection Time: 12/31/22  4:37 PM    Specimen: Blood   Result Value Ref Range    BLOOD CULTURE RESULT Culture In Progress, Daily Updates To Follow     Procalcitonin    Collection Time: 12/31/22  4:37 PM   Result Value Ref Range    Procalcitonin 5.19 (H) 0.00 - 0.50 ng/ml   Ethanol    Collection Time: 12/31/22  4:37 PM   Result Value Ref Range    Ethanol Lvl 4.2 (H) 0.0 - 3.0 mg/dl   Lactic Acid    Collection Time: 12/31/22  4:38 PM   Result Value Ref Range    Lactate 1.7 0.5 - 2.2 mmol/L   Protime-INR    Collection Time: 12/31/22  4:54 PM   Result Value Ref Range    Protime 19.0 (H) 10.2 - 12.9 seconds    INR 1.7 (H) 0.1 - 1.1     APTT    Collection Time: 12/31/22  4:54 PM   Result Value Ref Range    aPTT 34.6 25.1 - 36.5 seconds   Urinalysis    Collection Time: 12/31/22  4:56 PM   Result Value Ref Range    Color, UA Dark yellow (A) Yellow,Straw      Clarity, UA Slightly Cloudy (A) Clear      Glucose, Ur Negative Negative mg/dl    Bilirubin, Urine Moderate (A) Negative      Ketones, Urine 40 mg/dL (A) Negative mg/dl    Specific Gravity, UA 1.020 1.005 - 1.030      Blood, Urine Trace-lysed (A) Negative      pH, Urine 5.5 5.0 - 9.0      Protein, Urine 100 mg/dL (A) Negative mg/dl    Urobilinogen, Urine >=8.0 E.U./dL 0.0 - 1.0 mg/dl    Nitrite, Urine Positive (A) Negative      Leukocyte Esterase, Urine Negative Negative     Urine Drug Screen    Collection Time: 12/31/22  4:56 PM   Result Value Ref Range    Amphetamine NEGATIVE NEGATIVE      Barbiturates, Urine NEGATIVE  NEGATIVE      Benzodiazepines, Urine NEGATIVE NEGATIVE      Cocaine NEGATIVE NEGATIVE      Marijuana NEGATIVE NEGATIVE      Methadone Screen, Urine NEGATIVE NEGATIVE      Opiates, Urine NEGATIVE NEGATIVE      Phencyclidine NEGATIVE NEGATIVE     Legionella antigen, urine    Collection Time: 12/31/22  4:56 PM    Specimen: Urine   Result Value Ref Range    Legionella Urinary Ag NEGATIVE NEGATIVE     Strep Pneumoniae Antigen    Collection Time: 12/31/22  4:56 PM    Specimen: Urine   Result Value Ref Range    STREP PNEUMONIAE ANTIGEN, URINE NEGATIVE NEGATIVE     Microscopic Urinalysis    Collection Time: 12/31/22  4:56 PM   Result Value Ref Range    Squam Epithel,  UA 1-4 NEGATIVE,OCCASIONAL,1-4,5-9,10-14,15-29 /LPF    WBC, UA 1-4 (A) NEGATIVE /HPF    RBC, UA OCCASIONAL (A) NEGATIVE /HPF    BACTERIA, URINE 2+ (A) NEGATIVE /HPF    Granular Casts, Coarse 5-9 (A) NEGATIVE /LPF   POCT Urinalysis no Micro    Collection Time: 12/31/22  4:58 PM   Result Value Ref Range    Glucose, Ur Negative NEGATIVE,Negative mg/dl    Bilirubin, Urine Moderate (A) NEGATIVE,Negative      Ketones, Urine 40 (A) NEGATIVE,Negative mg/dl    Specific Gravity, UA 1.020 1.005 - 1.030      Blood, Urine Trace-intact (A) NEGATIVE,Negative      pH, Urine 5.5 5 - 9      Protein, Urine 100 (A) NEGATIVE,Negative mg/dl    Urobilinogen, Urine >=8.0 0.0 - 1.0 EU/dl    Nitrite, Urine Negative NEGATIVE,Negative      Leukocyte Esterase, Urine Negative NEGATIVE,Negative      Color, UA Yellow      Clarity, UA Clear     Culture, Urine    Collection Time: 12/31/22  5:30 PM    Specimen: Urine   Result Value Ref Range    Culture Result Culture In Progress, Daily Updates To Follow (A)     Culture, Respiratory    Collection Time: 12/31/22  7:07 PM    Specimen: RESPIRATORY   Result Value Ref Range    Gram Stain Result (A)       <10 Epithelial cells/lpf  >25 WBC's/lpf  Rare Gram Positive Cocci  Rare Gram Variable Bacilli      Culture Result Moderate Commensal Flora Isolated  After 24 Hours     POC Glucose Fingerstick    Collection Time: 12/31/22 11:43 PM   Result Value Ref Range    POC Glucose 149 (H) 65 - 105 mg/dL   Lactic Acid    Collection Time: 01/01/23 12:30 AM   Result Value Ref Range    Lactate 1.4 0.5 - 2.2 mmol/L   Protime-INR    Collection Time: 01/01/23 12:30 AM   Result Value Ref Range    Protime 18.3 (H) 10.2 - 12.9 seconds    INR 1.6 (H) 0.1 - 1.1     CBC with Auto Differential    Collection Time: 01/01/23 12:30 AM   Result Value Ref Range    WBC 36.2 (H) 4.0 - 11.0 1000/mm3    Neutrophils Segmented 85.0 (H) 34 - 64 %    RBC 5.11 3.80 - 5.70 M/uL    Lymphocytes 5.0 (L) 28 - 48 %    Hemoglobin 14.4 13.0 - 18.0 gm/dl    Monocytes 6.0 1 - 13 %    Hematocrit 44.6 36.0 - 55.0 %    MCV 87.3 80.0 - 98.0 fL    MCH 28.2 23.0 - 34.6 pg    MCHC 32.3 30.0 - 36.0 gm/dl    Metamyelocytes 2.0 (H) 0 - 0 %    Platelets 444 140 - 450 1000/mm3    Myelocytes 1.0 (H) 0 - 0 %    MPV 9.6 6.0 - 10.0 fL    RDW 45.7 (H) 35.1 - 43.9      Poikilocytosis 2      Promyelocytes 1      Nucleated RBCs 1      Platelet Appearance NORMAL      Giant PLTs OCCASIONAL      Toxic Granulation OCCASIONAL      Smudge Cells FEW     Comprehensive  Metabolic Panel w/ Reflex to MG    Collection Time: 01/01/23 12:30 AM   Result Value Ref Range    Potassium 3.9 3.5 - 5.1 mEq/L    Chloride 100 98 - 107 mEq/L    Sodium 135 (L) 136 - 145 mEq/L    CO2 22 20 - 31 mEq/L    Glucose 157 (H) 74 - 106 mg/dl    BUN 27 (H) 9 - 23 mg/dl    Creatinine 1.61 0.96 - 1.30 mg/dl    Calcium 9.2 8.7 - 04.5 mg/dl    Anion Gap 13 5 - 15 mmol/L    AST 32.0 0.0 - 33.9 U/L    ALT 27 10 - 49 U/L    Alkaline Phosphatase 129 (H) 46 - 116 U/L    Total Bilirubin 1.40 (H) 0.30 - 1.20 mg/dl    Total Protein 7.9 5.7 - 8.2 gm/dl    Albumin 3.1 (L) 3.4 - 5.0 gm/dl   POC [id number] GAS,LACTATE    Collection Time: 01/01/23 12:38 AM   Result Value Ref Range    Ph 7.362 7.350 - 7.450      PCO2 42.6 35.0 - 45.0 mm Hg    PO2 102.0 (H) 75 - 100 mm Hg    HCO3 24.2  18.0 - 26.0 mmol/L    O2 Sat 98.0 90 - 100 %    Total CO2 25.0 24 - 29 mmol/L    Lactate 1.34 0.40 - 2.00 mmol/L    Base Excess -1 -2 - 3 mmol/L    Sample Type Art      Draw Site R Radial      Delivery Systems Nasal Can      Allen Test Pass      LITERS PER MINUTE 4     POC Glucose Fingerstick    Collection Time: 01/01/23 11:58 AM   Result Value Ref Range    POC Glucose 180 (H) 65 - 105 mg/dL       @[id number] @    Principal Problem:    Pleural effusion on right  Active Problems:    SOB (shortness of breath)  Resolved Problems:    * No resolved hospital problems. *    ____________________________________________________________________      Attending Physician: Theodis Aguas, MD       Dragon medical dictation software was used for portions of this report. Unintended voice recognition errors may occur.

## 2023-01-01 NOTE — Op Note (Signed)
VASCULAR & INTERVENTIONAL RADIOLOGY  CT CHEST TUBE PLACEMENT    Informed consent obtained.   Right 12 French chest tube placed.    Pleural fluid removed and sent to lab.  Catheter connected to continuous low pressure [facility] suction.    Chest x-ray now and daily in AM.   Full radiology report to follow.      Odelia Gage MD  Director Interventional Radiology  Jan 01, 2023  3:14 PM

## 2023-01-01 NOTE — Consults (Signed)
[facility]       Pulmonary Consult Note    Patient: Thomas Browning               Sex: male          DOA: 12/31/2022       Date of Birth:  10-03-1965      Age:  57 y.o.        LOS:  LOS: 1 day        Reason for Consult: Right loculated pleural effusion    IMPRESSION:   Large loculated right pleural effusion noticed on chest x-ray obtained on 12/31/2022.  Empyema versus parapneumonic effusion.  Elevated procalcitonin level.  Negative acute respiratory viral pathogen panel and urine antigen.  Acute on chronic systolic congestive heart failure.  Last echo obtained in October 2021 demonstrated LVEF around 45 to 50%.  Sepsis likely from pulmonary origin.  Hypertensive history present on hospitalization.  Prolonged QTc.  Suspected chronic kidney disease with baseline creatinine around 1.2-1.4.  NIDDM type II, last HbA1c 6.3 in 2021.  Morbid obesity.  Medical noncompliance.   RECOMMENDATIONS:   Currently on supplemental oxygen through nasal cannula.  Titrate, and target oxygen saturation greater than 88%.  Continue vancomycin, Zosyn, doxycycline.  Follow culture results and consider appropriate antibiotic de-escalation.  ID recommendations.  Continue Lasix 40 mg IV twice a day.  Follow ins and out.  Follow renal function.  Avoid nephrotoxic agent.  Scheduled to undergo CT-guided right chest tube placement.  Obtain pleural fluid analysis, culture, and cytology.  Further management as per thoracic surgery.  Continue carvedilol 6.25 mg twice a day.  May need dose adjustment.  Start appropriate DVT prophylaxis.  Will defer respective systems problem management to primary and other consultant and follow patient with primary and other team  Further recommendations will be based on the patient's response to recommended treatment and results of the investigation ordered.     HPI:   Mr. Thomas Browning is a 57 y.o. male who has known history of hypertension, diabetes, chronic kidney disease, chronic  systolic congestive heart failure and cervalgia evaluated for right pleural effusion and shortness of breath.  Patient presented to [facility] ER on 12/31/2022 with cough and shortness of breath present for past couple of days.  He also endorsed worsening bilateral lower extremity swelling.  He initially noticed cough and shortness of breath and later on significant interval progression of shortness of breath to a point where he was not able to sleep in flat position.  He reports productive cough predominantly coughing up thick mucus.  He denies any history of tobacco abuse or illicit drug use.  He also denies any chronic lung disease such as COPD or asthma.  In ER he was found to have significant leukocytosis and elevated proBNP level.  Chest x-ray obtained at the time of hospitalization demonstrated findings concerning loculated pleural effusion on right side.  Patient declined to undergo CT of the chest due to significant shortness of breath and not able to lie in the supine position.  Thoracic surgery has been consulted.  Offered bedside chest tube placement to patient declined and now patient has been scheduled to undergo CT-guided chest tube placement.      Review of Systems  Review of Systems   Respiratory:  Positive for cough and shortness of breath.    All other systems reviewed and are negative.          Past Medical History  Past Medical History:   Diagnosis Date    HTN (hypertension)     Morbid obesity (HCC)        Past Surgical History  History reviewed. No pertinent surgical history.    Family History  History reviewed. No pertinent family history.    Social History  Social History     Tobacco Use    Smoking status: Never     Passive exposure: Never    Smokeless tobacco: Never   Vaping Use    Vaping Use: Never used   Substance Use Topics    Alcohol use: Never    Drug use: Never        Medications  Current Facility-Administered Medications   Medication Dose Route Frequency    niCARdipine (CARDENE) 20 mg in  0.9 % sodium chloride 200 mL solution  2.5-15 mg/hr IntraVENous Continuous    piperacillin-tazobactam (ZOSYN) 4,500 mg in sodium chloride 0.9 % 100 mL IVPB (mini-bag)  4,500 mg IntraVENous q8h    iopamidol (ISOVUE-300) 61 % injection 80 mL  80 mL IntraVENous ONCE PRN    benzonatate (TESSALON) capsule 100 mg  100 mg Oral TID PRN    albuterol (ACCUNEB) nebulizer solution 1.25 mg  1.25 mg Nebulization Q6H PRN    nystatin (MYCOSTATIN) powder   Topical BID    senna (SENOKOT) tablet 8.6 mg  1 tablet Oral Nightly    polyethylene glycol (GLYCOLAX) packet 17 g  17 g Oral Daily    doxycycline (VIBRAMYCIN) 100 mg in sodium chloride 0.9 % 100 mL IVPB (mini-bag)  100 mg IntraVENous Q12H    furosemide (LASIX) injection 40 mg  40 mg IntraVENous BID    potassium chloride (KLOR-CON M) extended release tablet 20 mEq  20 mEq Oral Daily    carvedilol (COREG) tablet 6.25 mg  6.25 mg Oral BID WC    sodium chloride flush 0.9 % injection 5-40 mL  5-40 mL IntraVENous 2 times per day    sodium chloride flush 0.9 % injection 5-40 mL  5-40 mL IntraVENous PRN    0.9 % sodium chloride infusion   IntraVENous PRN    acetaminophen (TYLENOL) tablet 650 mg  650 mg Oral Q6H PRN    Or    acetaminophen (TYLENOL) suppository 650 mg  650 mg Rectal Q6H PRN    lidocaine 4 % external patch 1 patch  1 patch TransDERmal Daily     Prior to Admission medications    Not on File       Allergy  No Known Allergies    Physical Exam:   Vital Signs:    Blood pressure (!) 162/97, pulse 96, temperature 97.1 F (36.2 C), temperature source Temporal, resp. rate 24, height 1.803 m (5\' 11" ), weight (!) 154.3 kg (340 lb 2.7 oz), SpO2 96 %. Body mass index is 47.44 kg/m.            Temp (24hrs), Avg:97.5 F (36.4 C), Min:97.1 F (36.2 C), Max:97.8 F (36.6 C)       Intake/Output:   Last shift:      No intake/output data recorded.  Last 3 shifts: 05/25 1901 - 05/27 0700  In: 10 [I.V.:10]  Out: 840 [Urine:840]    Intake/Output Summary (Last 24 hours) at 01/01/2023 1051  Last  data filed at 01/01/2023 0200  Gross per 24 hour   Intake 10 ml   Output 840 ml   Net -830 ml      Physical Exam  Vitals and nursing note reviewed.  HENT:      Mouth/Throat:      Mouth: Mucous membranes are moist.   Eyes:      Pupils: Pupils are equal, round, and reactive to light.   Cardiovascular:      Rate and Rhythm: Regular rhythm.   Pulmonary:      Comments: Reduced air entry on right side  Abdominal:      General: Bowel sounds are normal.      Palpations: Abdomen is soft.   Musculoskeletal:         General: Swelling present.      Comments: Trace   Neurological:      General: No focal deficit present.      Mental Status: He is alert and oriented to person, place, and time.             Labs Reviewed:  Recent Results (from the past 24 hour(s))   EKG 12 Lead    Collection Time: 12/31/22  2:21 PM   Result Value Ref Range    Ventricular Rate 96 BPM    Atrial Rate 96 BPM    P-R Interval 146 ms    QRS Duration 106 ms    Q-T Interval 384 ms    QTC Calculation (Bezet) 485 ms    Calculated P Axis 27 degrees    Calculated R Axis 7 degrees    Calculated T Axis 60 degrees    DIAGNOSIS, 93000       Normal sinus rhythm  Incomplete left bundle branch block  Minimal voltage criteria for LVH, may be normal variant ( Cornell product )  Prolonged QT  Abnormal ECG  No previous ECGs available  Confirmed by Arrey-Mbi, M.D., [place] ([id number]) on 01/01/2023 7:21:44 AM     CBC with Auto Differential    Collection Time: 12/31/22  2:33 PM   Result Value Ref Range    WBC 34.5 (H) 4.0 - 11.0 1000/mm3    RBC 4.91 3.80 - 5.70 M/uL    Hemoglobin 14.2 13.0 - 18.0 gm/dl    Hematocrit 91.4 78.2 - 55.0 %    MCV 86.6 80.0 - 98.0 fL    MCH 28.9 23.0 - 34.6 pg    MCHC 33.4 30.0 - 36.0 gm/dl    Platelets 956 213 - 450 1000/mm3    MPV 9.4 6.0 - 10.0 fL    RDW 44.9 (H) 35.1 - 43.9      Nucleated RBCs 0 0 - 0      Immature Granulocytes % 9.2 (HH) 0.0 - 3.0 %    Neutrophils Segmented 81.0 (H) 34 - 64 %    Lymphocytes 7.0 (L) 28 - 48 %    Atypical Lymphocytes 2.0  (H) 0 - 0 %    Monocytes 8.0 1 - 13 %    Myelocytes 2.0 (H) 0 - 0 %    RBC Morphology NORMAL      Platelet Appearance NORMAL     CMP    Collection Time: 12/31/22  2:33 PM   Result Value Ref Range    Potassium 3.9 3.5 - 5.1 mEq/L    Chloride 99 98 - 107 mEq/L    Sodium 134 (L) 136 - 145 mEq/L    CO2 25 20 - 31 mEq/L    Glucose 113 (H) 74 - 106 mg/dl    BUN 30 (H) 9 - 23 mg/dl    Creatinine 0.86 (H) 0.70 - 1.30 mg/dl    GFR African American >60  GFR Non-African American 58      Calcium 9.2 8.7 - 10.4 mg/dl    Anion Gap 9 5 - 15 mmol/L    AST 34.0 (H) 0.0 - 33.9 U/L    ALT 27 10 - 49 U/L    Alkaline Phosphatase 120 (H) 46 - 116 U/L    Total Bilirubin 1.60 (H) 0.30 - 1.20 mg/dl    Total Protein 7.7 5.7 - 8.2 gm/dl    Albumin 3.0 (L) 3.4 - 5.0 gm/dl   proBNP, N-TERMINAL    Collection Time: 12/31/22  2:33 PM   Result Value Ref Range    NT Pro-BNP 1,729 (H) 0 - 125     Troponin    Collection Time: 12/31/22  2:33 PM   Result Value Ref Range    Troponin, High Sensitivity 27 0 - 53 ng/L   Lipid Panel    Collection Time: 12/31/22  2:33 PM   Result Value Ref Range    Cholesterol, Total 114 0 - 199 mg/dl    HDL <16 (L) 40 - 60 mg/dl    Triglycerides 109 0 - 150 mg/dl    LDL Cholesterol 70 0 - 130 mg/dl    Chol/HDL Ratio 6.0 (H) 0.0 - 5.0 Ratio   Hemoglobin A1C    Collection Time: 12/31/22  2:33 PM   Result Value Ref Range    Hemoglobin A1C 5.8 (H) 3.8 - 5.6 %   Lactate Dehydrogenase    Collection Time: 12/31/22  2:33 PM   Result Value Ref Range    LD 560 (H) 120 - 246 U/L   Respiratory Pathogen Panel    Collection Time: 12/31/22  3:45 PM    Specimen: Nasopharyngeal Swab   Result Value Ref Range    Adenovirus NOT DETECTED NOT DETECTED      Coronavirus 299E NOT DETECTED NOT DETECTED      Coronavirus HKU1 NOT DETECTED NOT DETECTED      Coronavirus NL63 NOT DETECTED NOT DETECTED      Coronavirus OC43 NOT DETECTED NOT DETECTED      Human Metapneumovirus NOT DETECTED NOT DETECTED      INFLUENZA A H1 NOT DETECTED NOT DETECTED       Influenza H1N1 NOT DETECTED NOT DETECTED      Influenza A PCR NOT DETECTED NOT DETECTED      Influenza B PCR NOT DETECTED NOT DETECTED      Influenza A H3 NOT DETECTED NOT DETECTED      Parainfluenza 1 NOT DETECTED NOT DETECTED      Parainfluenza 2 NOT DETECTED NOT DETECTED      Parainfluenza 3 NOT DETECTED NOT DETECTED      Parainfluenza NOT DETECTED NOT DETECTED      Rhinovirus/Enterovirus NOT DETECTED NOT DETECTED      RSV PCR NOT DETECTED NOT DETECTED      Bordetella Pertussis NOT DETECTED NOT DETECTED      Chlamydophilia pneumoniae NOT DETECTED NOT DETECTED      Mycoplasma pneumoniae NOT DETECTED NOT DETECTED      SARS-CoV-2 NOT DETECTED NOT DETECTED     Rapid Strep Screen    Collection Time: 12/31/22  3:45 PM    Specimen: Throat   Result Value Ref Range    Strep A Ag  Negative - No Streptococcus Group A DNA was Detected.       Negative - No Streptococcus Group A DNA was Detected.   MRSA by PCR    Collection Time: 12/31/22  3:45 PM    Specimen: Nares   Result Value Ref Range    MRSA PCR screen NEGATIVE NEGATIVE     Blood Culture 1    Collection Time: 12/31/22  4:37 PM    Specimen: Blood   Result Value Ref Range    BLOOD CULTURE RESULT Culture In Progress, Daily Updates To Follow     Blood Culture 2    Collection Time: 12/31/22  4:37 PM    Specimen: Blood   Result Value Ref Range    BLOOD CULTURE RESULT Culture In Progress, Daily Updates To Follow     Procalcitonin    Collection Time: 12/31/22  4:37 PM   Result Value Ref Range    Procalcitonin 5.19 (H) 0.00 - 0.50 ng/ml   Ethanol    Collection Time: 12/31/22  4:37 PM   Result Value Ref Range    Ethanol Lvl 4.2 (H) 0.0 - 3.0 mg/dl   Lactic Acid    Collection Time: 12/31/22  4:38 PM   Result Value Ref Range    Lactate 1.7 0.5 - 2.2 mmol/L   Protime-INR    Collection Time: 12/31/22  4:54 PM   Result Value Ref Range    Protime 19.0 (H) 10.2 - 12.9 seconds    INR 1.7 (H) 0.1 - 1.1     APTT    Collection Time: 12/31/22  4:54 PM   Result Value Ref Range    aPTT 34.6  25.1 - 36.5 seconds   Urinalysis    Collection Time: 12/31/22  4:56 PM   Result Value Ref Range    Color, UA Dark yellow (A) Yellow,Straw      Clarity, UA Slightly Cloudy (A) Clear      Glucose, Ur Negative Negative mg/dl    Bilirubin, Urine Moderate (A) Negative      Ketones, Urine 40 mg/dL (A) Negative mg/dl    Specific Gravity, UA 1.020 1.005 - 1.030      Blood, Urine Trace-lysed (A) Negative      pH, Urine 5.5 5.0 - 9.0      Protein, Urine 100 mg/dL (A) Negative mg/dl    Urobilinogen, Urine >=8.0 E.U./dL 0.0 - 1.0 mg/dl    Nitrite, Urine Positive (A) Negative      Leukocyte Esterase, Urine Negative Negative     Urine Drug Screen    Collection Time: 12/31/22  4:56 PM   Result Value Ref Range    Amphetamine NEGATIVE NEGATIVE      Barbiturates, Urine NEGATIVE NEGATIVE      Benzodiazepines, Urine NEGATIVE NEGATIVE      Cocaine NEGATIVE NEGATIVE      Marijuana NEGATIVE NEGATIVE      Methadone Screen, Urine NEGATIVE NEGATIVE      Opiates, Urine NEGATIVE NEGATIVE      Phencyclidine NEGATIVE NEGATIVE     Legionella antigen, urine    Collection Time: 12/31/22  4:56 PM    Specimen: Urine   Result Value Ref Range    Legionella Urinary Ag NEGATIVE NEGATIVE     Strep Pneumoniae Antigen    Collection Time: 12/31/22  4:56 PM    Specimen: Urine   Result Value Ref Range    STREP PNEUMONIAE ANTIGEN, URINE NEGATIVE NEGATIVE     Microscopic Urinalysis    Collection Time: 12/31/22  4:56 PM   Result Value Ref Range    Squam Epithel, UA 1-4 NEGATIVE,OCCASIONAL,1-4,5-9,10-14,15-29 /LPF    WBC, UA 1-4 (A) NEGATIVE /HPF    RBC, UA OCCASIONAL (  A) NEGATIVE /HPF    BACTERIA, URINE 2+ (A) NEGATIVE /HPF    Granular Casts, Coarse 5-9 (A) NEGATIVE /LPF   POCT Urinalysis no Micro    Collection Time: 12/31/22  4:58 PM   Result Value Ref Range    Glucose, Ur Negative NEGATIVE,Negative mg/dl    Bilirubin, Urine Moderate (A) NEGATIVE,Negative      Ketones, Urine 40 (A) NEGATIVE,Negative mg/dl    Specific Gravity, UA 1.020 1.005 - 1.030      Blood,  Urine Trace-intact (A) NEGATIVE,Negative      pH, Urine 5.5 5 - 9      Protein, Urine 100 (A) NEGATIVE,Negative mg/dl    Urobilinogen, Urine >=8.0 0.0 - 1.0 EU/dl    Nitrite, Urine Negative NEGATIVE,Negative      Leukocyte Esterase, Urine Negative NEGATIVE,Negative      Color, UA Yellow      Clarity, UA Clear     Culture, Urine    Collection Time: 12/31/22  5:30 PM    Specimen: Urine   Result Value Ref Range    Culture Result Culture In Progress, Daily Updates To Follow (A)     Culture, Respiratory    Collection Time: 12/31/22  7:07 PM    Specimen: RESPIRATORY   Result Value Ref Range    Gram Stain Result (A)       <10 Epithelial cells/lpf  >25 WBC's/lpf  Rare Gram Positive Cocci  Rare Gram Variable Bacilli      Culture Result Moderate Commensal Flora Isolated After 24 Hours     POC Glucose Fingerstick    Collection Time: 12/31/22 11:43 PM   Result Value Ref Range    POC Glucose 149 (H) 65 - 105 mg/dL   Lactic Acid    Collection Time: 01/01/23 12:30 AM   Result Value Ref Range    Lactate 1.4 0.5 - 2.2 mmol/L   Protime-INR    Collection Time: 01/01/23 12:30 AM   Result Value Ref Range    Protime 18.3 (H) 10.2 - 12.9 seconds    INR 1.6 (H) 0.1 - 1.1     CBC with Auto Differential    Collection Time: 01/01/23 12:30 AM   Result Value Ref Range    WBC 36.2 (H) 4.0 - 11.0 1000/mm3    Neutrophils Segmented 85.0 (H) 34 - 64 %    RBC 5.11 3.80 - 5.70 M/uL    Lymphocytes 5.0 (L) 28 - 48 %    Hemoglobin 14.4 13.0 - 18.0 gm/dl    Monocytes 6.0 1 - 13 %    Hematocrit 44.6 36.0 - 55.0 %    MCV 87.3 80.0 - 98.0 fL    MCH 28.2 23.0 - 34.6 pg    MCHC 32.3 30.0 - 36.0 gm/dl    Metamyelocytes 2.0 (H) 0 - 0 %    Platelets 444 140 - 450 1000/mm3    Myelocytes 1.0 (H) 0 - 0 %    MPV 9.6 6.0 - 10.0 fL    RDW 45.7 (H) 35.1 - 43.9      Poikilocytosis 2      Promyelocytes 1      Nucleated RBCs 1      Platelet Appearance NORMAL      Giant PLTs OCCASIONAL      Toxic Granulation OCCASIONAL      Smudge Cells FEW     Comprehensive Metabolic Panel  w/ Reflex to MG    Collection Time: 01/01/23 12:30 AM   Result Value  Ref Range    Potassium 3.9 3.5 - 5.1 mEq/L    Chloride 100 98 - 107 mEq/L    Sodium 135 (L) 136 - 145 mEq/L    CO2 22 20 - 31 mEq/L    Glucose 157 (H) 74 - 106 mg/dl    BUN 27 (H) 9 - 23 mg/dl    Creatinine 1.61 0.96 - 1.30 mg/dl    Calcium 9.2 8.7 - 04.5 mg/dl    Anion Gap 13 5 - 15 mmol/L    AST 32.0 0.0 - 33.9 U/L    ALT 27 10 - 49 U/L    Alkaline Phosphatase 129 (H) 46 - 116 U/L    Total Bilirubin 1.40 (H) 0.30 - 1.20 mg/dl    Total Protein 7.9 5.7 - 8.2 gm/dl    Albumin 3.1 (L) 3.4 - 5.0 gm/dl   POC [id number]:[id number] GAS,LACTATE    Collection Time: 01/01/23 12:38 AM   Result Value Ref Range    Ph 7.362 7.350 - 7.450      PCO2 42.6 35.0 - 45.0 mm Hg    PO2 102.0 (H) 75 - 100 mm Hg    HCO3 24.2 18.0 - 26.0 mmol/L    O2 Sat 98.0 90 - 100 %    Total CO2 25.0 24 - 29 mmol/L    Lactate 1.34 0.40 - 2.00 mmol/L    Base Excess -1 -2 - 3 mmol/L    Sample Type Art      Draw Site R Radial      Delivery Systems Nasal Can      Allen Test Pass      LITERS PER MINUTE 4             No results for input(s): "HCOPOC", "PCOPOC", "PHPOC", "PO2POC" in the last 72 hours.    Invalid input(s): "FIO2I", "IFO2", "HCO3I", "IHCO3", "IPOC2", "PCO2I", "IPHI", "PHI", "PO2I"    @MICRORESULTS @    CBC:  Recent Labs     12/31/22  1433 01/01/23  0030   WBC 34.5* 36.2*   RBC 4.91 5.11   HGB 14.2 14.4   HCT 42.5 44.6   MCV 86.6 87.3   RDW 44.9* 45.7*   PLT 419 444     CHEMISTRIES:  Recent Labs     12/31/22  1433 01/01/23  0030   NA 134* 135*   K 3.9 3.9   CL 99 100   CO2 25 22   BUN 30* 27*   CREATININE 1.34* 1.26   GLUCOSE 113* 157*     PT/INR:  Recent Labs     12/31/22  1654 01/01/23  0030   PROTIME 19.0* 18.3*   INR 1.7* 1.6*     APTT:  Recent Labs     12/31/22  1654   APTT 34.6     LIVER PROFILE:  Recent Labs     12/31/22  1433 01/01/23  0030   AST 34.0* 32.0   ALT 27 27   BILITOT 1.60* 1.40*   ALKPHOS 120* 129*        Results       Procedure Component Value Units Date/Time    Culture,  Body Fluid [[id number]]     Order Status: No result Specimen: Body Fluid from Pleural     Culture, Respiratory [[id number]]  (Abnormal) Collected: 12/31/22 1907    Order Status: Completed Specimen: RESPIRATORY Updated: 01/01/23 1043     Gram Stain Result       <10 Epithelial cells/lpf  >  25 WBC's/lpf  Rare Gram Positive Cocci  Rare Gram Variable Bacilli       Culture Result       Moderate Commensal Flora Isolated After 24 Hours          Culture, Urine [[id number]]  (Abnormal) Collected: 12/31/22 1730    Order Status: Completed Specimen: Urine Updated: 01/01/23 0913     Culture Result       Culture In Progress, Daily Updates To Follow          Legionella antigen, urine [[id number]] Collected: 12/31/22 1656    Order Status: Completed Specimen: Urine Updated: 12/31/22 1746     Legionella Urinary Ag NEGATIVE        Comment: Presumptive negative for L. pneumophila serogroup 1 antigen in urine, suggesting no recent or  current infection. Infection due to Legionella cannot be ruled out since other serogroups and  species may cause disease, antigen may not be present in urine in early infection and the level  of antigen present in the urine may be below the detection limit of the test.           Strep Pneumoniae Antigen [[id number]] Collected: 12/31/22 1656    Order Status: Completed Specimen: Urine Updated: 12/31/22 1746     STREP PNEUMONIAE ANTIGEN, URINE NEGATIVE        Comment: Presumptive negative for pneumococcal pneumonia, suggesting no current or recent pneumococcal  infection.  Infection due to S. pneumoniae cannot be ruled out since the antigen present in the  sample may be below the detection limit of the test.         Blood Culture 1 [[id number]] Collected: 12/31/22 1637    Order Status: Completed Specimen: Blood Updated: 01/01/23 0738     BLOOD CULTURE RESULT       Culture In Progress, Daily Updates To Follow          Blood Culture 2 [[id number]] Collected: 12/31/22 1637    Order Status: Completed Specimen:  Blood Updated: 01/01/23 0739     BLOOD CULTURE RESULT       Culture In Progress, Daily Updates To Follow          Respiratory Pathogen Panel [[id number]] Collected: 12/31/22 1545    Order Status: Completed Specimen: Nasopharyngeal Swab Updated: 12/31/22 1940     Adenovirus NOT DETECTED        Coronavirus 299E NOT DETECTED        Coronavirus HKU1 NOT DETECTED        Coronavirus NL63 NOT DETECTED        Coronavirus OC43 NOT DETECTED        Human Metapneumovirus NOT DETECTED        INFLUENZA A H1 NOT DETECTED        Influenza H1N1 NOT DETECTED        Influenza A PCR NOT DETECTED        Influenza B PCR NOT DETECTED        Influenza A H3 NOT DETECTED        Parainfluenza 1 NOT DETECTED        Parainfluenza 2 NOT DETECTED        Parainfluenza 3 NOT DETECTED        Parainfluenza NOT DETECTED        Rhinovirus/Enterovirus NOT DETECTED        RSV PCR NOT DETECTED        Bordetella Pertussis NOT DETECTED        Chlamydophilia pneumoniae NOT DETECTED  Mycoplasma pneumoniae NOT DETECTED        SARS-CoV-2 NOT DETECTED        Comment: A Not Detected (negative) test result for this test means that SARS- CoV-2 RNA was not present  in the specimen above the limit of detection. However, a negative result does not rule out the  possibility of COVID-19 and should not be used as the sole basis for treatment or patient  management decisions.   This test for SARS-CoV2 has been authorized by FDA under an Emergency Use Authorization (EUA).         Rapid Strep Screen [[id number]] Collected: 12/31/22 1545    Order Status: Completed Specimen: Throat Updated: 12/31/22 1747     Strep A Ag       Negative - No Streptococcus Group A DNA was Detected.           Comment: NEGATIVE Streptococcus group A PCR do NOT REFLEX to culture since PCR is very sensitive (equal  to culture for Strep. group A), however, if pharyngitis is suspected by another beta hemolytic  Streptococcus group then you must order a throat culture. Call microbiology ext [phone]  if culture  is needed to be added on for testing.         MRSA by PCR [[id number]] Collected: 12/31/22 1545    Order Status: Completed Specimen: Nares Updated: 12/31/22 1852     MRSA PCR screen NEGATIVE                 Imaging:  [x] I have personally reviewed the patient's chest radiographs images and report with the patient     Xray Result (most recent):  XR CHEST PORTABLE 12/31/2022    Narrative  Indication: SOB. .    Impression  IMPRESSION: Portable AP upright view the chest exposed at 2:48 PM Dec 31, 2022  reveals a moderate to large right loculated pleural effusion with atelectasis in  the underlying lung..  The left lung is clear and the heart is of normal size.  No prior studies for comparison.    Electronically signed by: Scheryl Darter, MD 12/31/2022 2:57 PM EDT  Workstation ID: [id number]         [x] See my orders for details    My assessment, plan of care, findings, medications, side effects etc were discussed with:  [x] nursing [] PT/OT    [] respiratory therapy [] Dr.   Hilda Lias family [] Patient        Verlan Friends, MD

## 2023-01-02 ENCOUNTER — Inpatient Hospital Stay: Admit: 2023-01-02 | Primary: Diagnostic Radiology

## 2023-01-02 DIAGNOSIS — J302 Other seasonal allergic rhinitis: Secondary | ICD-10-CM | POA: Diagnosis not present

## 2023-01-02 DIAGNOSIS — I10 Essential (primary) hypertension: Secondary | ICD-10-CM | POA: Diagnosis not present

## 2023-01-02 DIAGNOSIS — J90 Pleural effusion, not elsewhere classified: Secondary | ICD-10-CM | POA: Diagnosis not present

## 2023-01-02 LAB — EKG 12-LEAD
Atrial Rate: 78 {beats}/min
Calculated P Axis: 21 degrees
Calculated R Axis: 3 degrees
Calculated T Axis: 37 degrees
DIAGNOSIS, 93000: NORMAL
P-R Interval: 162 ms
Q-T Interval: 408 ms
QRS Duration: 112 ms
QTC Calculation (Bezet): 465 ms
Ventricular Rate: 78 {beats}/min

## 2023-01-02 LAB — ECHOCARDIOGRAM 2D W DOPPLER W COLOR W CONTRAST: Left Ventricular Ejection Fraction: 39

## 2023-01-02 LAB — CULTURE, URINE: Culture Result: 60000 — AB

## 2023-01-02 LAB — PROTEIN, BODY FLUID: Protein, body fluid: 4.5 gm/dl

## 2023-01-02 LAB — POC GLUCOSE FINGERSTICK
POC Glucose: 142 mg/dL — ABNORMAL HIGH (ref 65–105)
POC Glucose: 120 mg/dL — ABNORMAL HIGH (ref 65–105)

## 2023-01-02 MED ORDER — LOSARTAN POTASSIUM 25 MG PO TABS
25 | Freq: Every day | ORAL | Status: DC
Start: 2023-01-02 — End: 2023-01-08
  Administered 2023-01-03 – 2023-01-08 (×7): 25 mg via ORAL

## 2023-01-02 MED ORDER — OXYCODONE HCL 5 MG PO TABS
5 | ORAL | Status: AC | PRN
Start: 2023-01-02 — End: 2023-01-05
  Administered 2023-01-02 – 2023-01-05 (×5): 5 mg via ORAL

## 2023-01-02 MED ORDER — SODIUM CHLORIDE 0.9 % IV SOLN
0.9 | Freq: Two times a day (BID) | INTRAVENOUS | Status: DC
Start: 2023-01-02 — End: 2023-01-04
  Administered 2023-01-02 – 2023-01-03 (×2): 10 mg via INTRAPLEURAL

## 2023-01-02 MED ORDER — NORMAL SALINE FLUSH 0.9 % IV SOLN
0.9 | INTRAVENOUS | Status: DC | PRN
Start: 2023-01-02 — End: 2023-01-10
  Administered 2023-01-02: 14:00:00 10 mL via INTRAVENOUS

## 2023-01-02 MED ORDER — DORNASE ALFA 2.5 MG/2.5ML IN SOLN
2.5 | Freq: Two times a day (BID) | RESPIRATORY_TRACT | Status: DC
Start: 2023-01-02 — End: 2023-01-04
  Administered 2023-01-02 – 2023-01-03 (×2): 5 mg via INTRAPLEURAL

## 2023-01-02 MED ORDER — HEPARIN SODIUM (PORCINE) 5000 UNIT/ML IJ SOLN
5000 UNIT/ML | Freq: Two times a day (BID) | INTRAMUSCULAR | Status: DC
Start: 2023-01-02 — End: 2023-01-10
  Administered 2023-01-02 – 2023-01-10 (×15): 5000 [IU] via SUBCUTANEOUS

## 2023-01-02 MED ORDER — PERFLUTREN LIPID MICROSPHERE IV SUSP
Freq: Once | INTRAVENOUS | Status: AC | PRN
Start: 2023-01-02 — End: 2023-01-02
  Administered 2023-01-02: 14:00:00 1.5 mL via INTRAVENOUS

## 2023-01-02 MED ORDER — BISACODYL 10 MG RE SUPP
10 | Freq: Every day | RECTAL | Status: DC | PRN
Start: 2023-01-02 — End: 2023-01-10

## 2023-01-02 MED ORDER — MAGNESIUM CITRATE 1.745 GM/30ML PO SOLN
1.745 | Freq: Once | ORAL | Status: AC | PRN
Start: 2023-01-02 — End: 2023-01-03

## 2023-01-02 MED ORDER — IOPAMIDOL 61 % IV SOLN
61 | Freq: Once | INTRAVENOUS | Status: AC | PRN
Start: 2023-01-02 — End: 2023-01-02
  Administered 2023-01-02: 15:00:00 85 mL via INTRAVENOUS

## 2023-01-02 MED FILL — ACETAMINOPHEN 325 MG PO TABS: 325 MG | ORAL | Qty: 2

## 2023-01-02 MED FILL — PULMOZYME 2.5 MG/2.5ML IN SOLN: 2.5 MG/ML | RESPIRATORY_TRACT | Qty: 5

## 2023-01-02 MED FILL — HEPARIN SODIUM (PORCINE) 5000 UNIT/ML IJ SOLN: 5000 UNIT/ML | INTRAMUSCULAR | Qty: 1

## 2023-01-02 MED FILL — PIPERACILLIN SOD-TAZOBACTAM SO 4.5 (4-0.5) G IV SOLR: 4.5 (4-0.5) g | INTRAVENOUS | Qty: 4500

## 2023-01-02 MED FILL — NYSTOP 100000 UNIT/GM EX POWD: 100000 UNIT/GM | CUTANEOUS | Qty: 15

## 2023-01-02 MED FILL — DEFINITY 6.52 MG/ML IV SUSP: 6.52 MG/ML | INTRAVENOUS | Qty: 1.5

## 2023-01-02 MED FILL — GNP MAGNESIUM CITRATE 1.745 GM/30ML PO SOLN: 1.745 GM/30ML | ORAL | Qty: 296

## 2023-01-02 MED FILL — CATHFLO ACTIVASE 2 MG IJ SOLR: 2 MG | INTRAMUSCULAR | Qty: 10

## 2023-01-02 MED FILL — CARVEDILOL 6.25 MG PO TABS: 6.25 MG | ORAL | Qty: 1

## 2023-01-02 MED FILL — BENZONATATE 100 MG PO CAPS: 100 MG | ORAL | Qty: 1

## 2023-01-02 MED FILL — ISOVUE-300 61 % IV SOLN: 61 % | INTRAVENOUS | Qty: 85

## 2023-01-02 MED FILL — DOCUSATE SODIUM 100 MG PO CAPS: 100 MG | ORAL | Qty: 1

## 2023-01-02 MED FILL — OXYCODONE HCL 5 MG PO TABS: 5 MG | ORAL | Qty: 1

## 2023-01-02 MED FILL — FUROSEMIDE 10 MG/ML IJ SOLN: 10 MG/ML | INTRAMUSCULAR | Qty: 4

## 2023-01-02 MED FILL — GNP LIDOCAINE PAIN RELIEF 4 % EX PTCH: 4 % | CUTANEOUS | Qty: 1

## 2023-01-02 MED FILL — KLOR-CON M20 20 MEQ PO TBCR: 20 MEQ | ORAL | Qty: 1

## 2023-01-02 MED FILL — GERI-KOT 8.6 MG PO TABS: 8.6 MG | ORAL | Qty: 1

## 2023-01-02 NOTE — Progress Notes (Signed)
THORACIC SURGERY PROGRESS NOTE  Progress Note    Patient: Thomas Browning               Sex: Male          DOA: 12/31/2022       Date of Birth:  05/15/1966      Age:  57 y.o.        LOS:  LOS: 2 days     Chart reviewed.    Assessment and Plan:   Right loculated pleural effusion.  Hypertension.  Acute on chronic systolic congestive heart failure with preserved ejection fraction.  Prolonged QTc (improved, today, from on 12/31/19).    -CT chest to evaluate loculated right pleural effusion per Dr. Effie Shy ordered this morning.  -Continue to monitor chest tube output, appears to be straw colored.  -Continue IV abx per ID/primary team.  -Encourage OOB to chair for meals.  -Encourage ambulation at least 4 times daily.  -Encourage incentive spirometer use.  -Further plans pending CT results.      Problem List:  Principal Problem:    Pleural effusion on right  Active Problems:    SOB (shortness of breath)  Resolved Problems:    * No resolved hospital problems. *            Subjective:    Patient seen and examined on rounds today.  I have independently reviewed all pertinent labs and radiologic films.    Patient ambulating with assistance to restroom.  States he is feeling a little better this morning.  Vital signs reviewed, afebrile with Tmax 97.17F, HR 73-94bpm, BP ranged 90/74 - 162/97, SpO2 ranged 79-98%.  Documented as being on 5L nasal cannula this morning at 0500.      Objective:     Vital Signs:  BP (!) 124/94   Pulse 81   Temp 97.8 F (36.6 C) (Temporal)   Resp 21   Ht 1.803 m (5\' 11" )   Wt (!) 154.3 kg (340 lb 2.7 oz)   SpO2 (!) 89%   BMI 47.44 kg/m   Temp (24hrs), Avg:97.5 F (36.4 C), Min:97.3 F (36.3 C), Max:97.8 F (36.6 C)    Admission Weight: Last Weight   Weight - Scale: (!) 149.7 kg (330 lb) Weight - Scale: (!) 154.3 kg (340 lb 2.7 oz)       Physical Examination:     General:  Awake, alert, oriented to person, place, time and situation.  Ambulating to bathroom.  Lungs: Right lower lobe with  diminished lung sounds.  Left lung fields clear.  Heart:  S1/S2 audible, regular rate, regular rhythm.  Abdomen: Obese, soft.  Extremities: Warm and well perfused.  Edema - mild lower extremity edema.    Chest Tubes: Right 12Fr pigtail catheter, straw fluid in tubing and [facility], total of output since placed (18 hours).      Intake and Output:  Last three shifts:  05/26 1901 - 05/28 0700  In: 403.7 [I.V.:102]  Out: 2520 [Urine:2290]    Lab Results:  Lab Results   Component Value Date/Time    WBC 36.2 (H) 01/01/2023 12:30 AM    WBC 34.5 (H) 12/31/2022 02:33 PM    HCT 44.6 01/01/2023 12:30 AM    HCT 42.5 12/31/2022 02:33 PM    PLT 444 01/01/2023 12:30 AM      Lab Results   Component Value Date/Time    NA 135 (L) 01/01/2023 12:30 AM    K 3.9 01/01/2023 12:30 AM  CL 100 01/01/2023 12:30 AM    CO2 22 01/01/2023 12:30 AM    GLUCOSE 157 (H) 01/01/2023 12:30 AM    BUN 27 (H) 01/01/2023 12:30 AM       Recent Days:  Recent Labs     12/31/22  1433 01/01/23  0030   WBC 34.5* 36.2*   HGB 14.2 14.4   HCT 42.5 44.6   PLT 419 444     Recent Labs     12/31/22  1433 12/31/22  1654 01/01/23  0030   NA 134*  --  135*   K 3.9  --  3.9   CL 99  --  100   CO2 25  --  22   GLUCOSE 113*  --  157*   BUN 30*  --  27*   CREATININE 1.34*  --  1.26   ALT 27  --  27   INR  --  1.7* 1.6*     Recent Labs     01/01/23  0038   PH 7.362   PCO2 42.6   PO2 102.0*   HCO3 24.2       Results       Procedure Component Value Units Date/Time    Culture, Body Fluid [[id number]] Collected: 01/01/23 1440    Order Status: Completed Specimen: Body fld Updated: 01/01/23 2013     Gram Stain Result Moderate WBC'S  No Organisms Seen       Culture, Respiratory [[id number]]  (Abnormal) Collected: 12/31/22 1907    Order Status: Completed Specimen: RESPIRATORY Updated: 01/01/23 1043     Gram Stain Result       <10 Epithelial cells/lpf  >25 WBC's/lpf  Rare Gram Positive Cocci  Rare Gram Variable Bacilli       Culture Result       Moderate Commensal Flora Isolated  After 24 Hours          Culture, Urine [[id number]]  (Abnormal) Collected: 12/31/22 1730    Order Status: Completed Specimen: Urine Updated: 01/02/23 0831     Culture Result --        60,000 CFU/mL  Skin and/or genital contamination      Legionella antigen, urine [[id number]] Collected: 12/31/22 1656    Order Status: Completed Specimen: Urine Updated: 12/31/22 1746     Legionella Urinary Ag NEGATIVE        Comment: Presumptive negative for L. pneumophila serogroup 1 antigen in urine, suggesting no recent or  current infection. Infection due to Legionella cannot be ruled out since other serogroups and  species may cause disease, antigen may not be present in urine in early infection and the level  of antigen present in the urine may be below the detection limit of the test.           Strep Pneumoniae Antigen [[id number]] Collected: 12/31/22 1656    Order Status: Completed Specimen: Urine Updated: 12/31/22 1746     STREP PNEUMONIAE ANTIGEN, URINE NEGATIVE        Comment: Presumptive negative for pneumococcal pneumonia, suggesting no current or recent pneumococcal  infection.  Infection due to S. pneumoniae cannot be ruled out since the antigen present in the  sample may be below the detection limit of the test.         Blood Culture 1 [[id number]] Collected: 12/31/22 1637    Order Status: Completed Specimen: Blood Updated: 01/02/23 0711     BLOOD CULTURE RESULT No Growth At 24 Hours  Blood Culture 2 [[id number]] Collected: 12/31/22 1637    Order Status: Completed Specimen: Blood Updated: 01/02/23 0711     BLOOD CULTURE RESULT No Growth At 24 Hours       Respiratory Pathogen Panel [[id number]] Collected: 12/31/22 1545    Order Status: Completed Specimen: Nasopharyngeal Swab Updated: 12/31/22 1940     Adenovirus NOT DETECTED        Coronavirus 299E NOT DETECTED        Coronavirus HKU1 NOT DETECTED        Coronavirus NL63 NOT DETECTED        Coronavirus OC43 NOT DETECTED        Human Metapneumovirus NOT DETECTED         INFLUENZA A H1 NOT DETECTED        Influenza H1N1 NOT DETECTED        Influenza A PCR NOT DETECTED        Influenza B PCR NOT DETECTED        Influenza A H3 NOT DETECTED        Parainfluenza 1 NOT DETECTED        Parainfluenza 2 NOT DETECTED        Parainfluenza 3 NOT DETECTED        Parainfluenza NOT DETECTED        Rhinovirus/Enterovirus NOT DETECTED        RSV PCR NOT DETECTED        Bordetella Pertussis NOT DETECTED        Chlamydophilia pneumoniae NOT DETECTED        Mycoplasma pneumoniae NOT DETECTED        SARS-CoV-2 NOT DETECTED        Comment: A Not Detected (negative) test result for this test means that SARS- CoV-2 RNA was not present  in the specimen above the limit of detection. However, a negative result does not rule out the  possibility of COVID-19 and should not be used as the sole basis for treatment or patient  management decisions.   This test for SARS-CoV2 has been authorized by FDA under an Emergency Use Authorization (EUA).         Rapid Strep Screen [[id number]] Collected: 12/31/22 1545    Order Status: Completed Specimen: Throat Updated: 12/31/22 1747     Strep A Ag       Negative - No Streptococcus Group A DNA was Detected.           Comment: NEGATIVE Streptococcus group A PCR do NOT REFLEX to culture since PCR is very sensitive (equal  to culture for Strep. group A), however, if pharyngitis is suspected by another beta hemolytic  Streptococcus group then you must order a throat culture. Call microbiology ext [phone] if culture  is needed to be added on for testing.         MRSA by PCR [[id number]] Collected: 12/31/22 1545    Order Status: Completed Specimen: Nares Updated: 12/31/22 1852     MRSA PCR screen NEGATIVE                RADIOLOGY:    XR CHEST PORTABLE  Narrative: Indication: Right chest tube  Frontal view chest     Compared to 01/01/2023 the heart is enlarged with ectatic tortuous aorta.  Moderate right pleural effusion decreased from prior study slightly with chest  tube at  right base. No pneumothorax. Chest tube slightly retracted.  Impression: IMPRESSION: Mild decrease in right pleural effusion with slight retraction of  right  chest tube from prior study. No other significant change.    Electronically signed by: Darlina Rumpf, MD 01/02/2023 7:50 AM EDT            Workstation ID: [id number]      Home Medications:  None        Current Medications:  Current Facility-Administered Medications   Medication Dose Route Frequency    niCARdipine (CARDENE) 20 mg in 0.9 % sodium chloride 200 mL solution  2.5-15 mg/hr IntraVENous Continuous    docusate sodium (COLACE) capsule 100 mg  100 mg Oral BID    piperacillin-tazobactam (ZOSYN) 4,500 mg in sodium chloride 0.9 % 100 mL IVPB (mini-bag)  4,500 mg IntraVENous q8h    benzonatate (TESSALON) capsule 100 mg  100 mg Oral TID PRN    albuterol (ACCUNEB) nebulizer solution 1.25 mg  1.25 mg Nebulization Q6H PRN    nystatin (MYCOSTATIN) powder   Topical BID    senna (SENOKOT) tablet 8.6 mg  1 tablet Oral Nightly    polyethylene glycol (GLYCOLAX) packet 17 g  17 g Oral Daily    furosemide (LASIX) injection 40 mg  40 mg IntraVENous BID    potassium chloride (KLOR-CON M) extended release tablet 20 mEq  20 mEq Oral Daily    carvedilol (COREG) tablet 6.25 mg  6.25 mg Oral BID WC    sodium chloride flush 0.9 % injection 5-40 mL  5-40 mL IntraVENous 2 times per day    sodium chloride flush 0.9 % injection 5-40 mL  5-40 mL IntraVENous PRN    0.9 % sodium chloride infusion   IntraVENous PRN    acetaminophen (TYLENOL) tablet 650 mg  650 mg Oral Q6H PRN    Or    acetaminophen (TYLENOL) suppository 650 mg  650 mg Rectal Q6H PRN    lidocaine 4 % external patch 1 patch  1 patch TransDERmal Daily       This note was generated with the aid of a dictation system.  Unintentional errors may be present.  Felix Pacini, PA-C  Jan 02, 2023  9:15 AM

## 2023-01-02 NOTE — Other (Signed)
Patient not willing to proceed with pleural lysis at this time. He wants to talk with hospitalist about his constipation. He understands importance of removing un-drained fluid and consequences possible need for surgical intervention if conservative management not successful.

## 2023-01-02 NOTE — Progress Notes (Signed)
Medical Progress Note      NAME: Thomas Browning   DOB:  04-21-1966  MRN:             [id number]    Date:  01/02/2023        Assessment:     Large loculated right pleural effusion  Acute hypoxic respiratory failure  Acute on chronic systolic, diastolic  Sepsis, suspect respiratory source  Hypertensive urgency  Prolonged QTc  DM 2,   Morbid obesity    Plan:     Status post chest tube placement yesterday  Chest CT scan today per thoracic surgery  Monitor respiratory status  Continue supplemental oxygen and titrate as needed  Patient was started on IV antibiotics and ID was consulted  Monitor hemodynamic status  Monitor renal function and electrolytes, replete as needed  Monitor blood glucose and continue insulin per Glucomander  Patient has full CODE STATUS  DVT prophylaxis    Subjective:     Shortness of breathing, no fever or chest pain    Objective:     Vitals:    Last 24hrs VS reviewed since prior progress note. Most recent are:  Vitals:    01/02/23 1415   BP:    Pulse: 75   Resp:    Temp:    SpO2: 91%     SpO2 Readings from Last 6 Encounters:   01/02/23 91%          Intake/Output Summary (Last 24 hours) at 01/02/2023 1503  Last data filed at 01/02/2023 0100  Gross per 24 hour   Intake 53.33 ml   Output 930 ml   Net -876.67 ml          Exam:     General:   Not in acute distress  HEENT: PERRLA, Neck Supple,  No JVD  Respiratory:   CTA bilaterally-no wheezes, rales, rhonchi, or crackles  Cardiac:  Regular Rate and Rythmn  - no murmurs, rubs or gallops  Abdominal:  Soft, non-tender, non-distended, positive bowel sounds  Extremities:  No cyanosis, or edema.  Skin: No rash  Neurological:  No focal neurological deficits    Medication:   Current Medications Reviewed    Current Facility-Administered Medications:     heparin (porcine) injection 5,000 Units, 5,000 Units, SubCUTAneous, BID, Verlan Friends, MD, 5,000 Units at 01/02/23 1023    sodium chloride flush 0.9 % injection 10 mL, 10 mL, IntraVENous, PRN, Williams Che, Mohsin,  MD, 10 mL at 01/02/23 0930    docusate sodium (COLACE) capsule 100 mg, 100 mg, Oral, BID, Odelia Gage III, MD, 100 mg at 01/01/23 2246    piperacillin-tazobactam (ZOSYN) 4,500 mg in sodium chloride 0.9 % 100 mL IVPB (mini-bag), 4,500 mg, IntraVENous, q8h, Hutchinson, Anne, DO, Stopped at 01/02/23 1307    benzonatate (TESSALON) capsule 100 mg, 100 mg, Oral, TID PRN, Hutchinson, Anne, DO, 100 mg at 01/01/23 2246    albuterol (ACCUNEB) nebulizer solution 1.25 mg, 1.25 mg, Nebulization, Q6H PRN, Hutchinson, Anne, DO    nystatin (MYCOSTATIN) powder, , Topical, BID, Hutchinson, Anne, DO, Given at 01/02/23 1135    senna (SENOKOT) tablet 8.6 mg, 1 tablet, Oral, Nightly, Hutchinson, Anne, DO, 8.6 mg at 01/01/23 2246    polyethylene glycol (GLYCOLAX) packet 17 g, 17 g, Oral, Daily, Hutchinson, Anne, DO, 17 g at 01/01/23 0925    furosemide (LASIX) injection 40 mg, 40 mg, IntraVENous, BID, Hutchinson, Anne, DO, 40 mg at 01/02/23 1023    potassium chloride (KLOR-CON M) extended release tablet 20  mEq, 20 mEq, Oral, Daily, Hutchinson, Anne, DO, 20 mEq at 01/02/23 0902    carvedilol (COREG) tablet 6.25 mg, 6.25 mg, Oral, BID WC, Hutchinson, Anne, DO, 6.25 mg at 01/02/23 9147    sodium chloride flush 0.9 % injection 5-40 mL, 5-40 mL, IntraVENous, 2 times per day, Hutchinson, Anne, DO, 10 mL at 01/02/23 0907    sodium chloride flush 0.9 % injection 5-40 mL, 5-40 mL, IntraVENous, PRN, Hutchinson, Anne, DO    0.9 % sodium chloride infusion, , IntraVENous, PRN, Hutchinson, Anne, DO    acetaminophen (TYLENOL) tablet 650 mg, 650 mg, Oral, Q6H PRN **OR** acetaminophen (TYLENOL) suppository 650 mg, 650 mg, Rectal, Q6H PRN, Hutchinson, Anne, DO    lidocaine 4 % external patch 1 patch, 1 patch, TransDERmal, Daily, Hutchinson, Anne, DO, 1 patch at 01/02/23 0902      Lab:     Lab Data Reviewed: (see below)  Recent Results (from the past 24 hour(s))   POC Glucose Fingerstick    Collection Time: 01/01/23  5:02 PM   Result Value Ref Range     POC Glucose 137 (H) 65 - 105 mg/dL   POC Glucose Fingerstick    Collection Time: 01/02/23 11:53 AM   Result Value Ref Range    POC Glucose 142 (H) 65 - 105 mg/dL       @[id number] @    Principal Problem:    Pleural effusion on right  Active Problems:    SOB (shortness of breath)  Resolved Problems:    * No resolved hospital problems. *    ____________________________________________________________________      Attending Physician: Theodis Aguas, MD       Dragon medical dictation software was used for portions of this report. Unintended voice recognition errors may occur.

## 2023-01-02 NOTE — Progress Notes (Signed)
Echo with Definity completed.

## 2023-01-02 NOTE — Progress Notes (Signed)
[facility]   (A Division of [facility])    Follow up Note      Date of Admission: 12/31/2022    Date of Note: 01/02/23        Subjective / Interval History:  Had CT guided chest tube drainage yesterday removing 300 cc dark yellow, mildly bloody fluid. Chest CT today showed moderate loculated right pleural effusion with atelectasis.  D/w Dr. Effie Shy who will plans pulmozyme instillation.  The patient initially refused but now agrees.  He is very concerned about his constipation.             Current Antimicrobials:    Prior Antimicrobials:  Vanc, Piptazo 5/26 - 2  Doxycycline 5/26 - 1          Assessment:  Rec / Plan:   Right Pleural effusion, parapneumonic  - two weeks worsening cough, some mild chills, sweats, no fever, + hoarseness  - WBC count 34.5  - CXR 5/26:  mod to large R loculated pleural effusion w/ atelectasis in underlying lung  - refused CT placement in ER 5/26  - s/p 5/27 CT guided R chest tube placement - 300 cc dark yellow, mildly bloody fluid. GS mod wbc's, NOS cx NGTD  - CT Chest 5/28: moderate loculated right pleural effusion with atelectasis -> continue vanc, piptazo hoping to streamline to Ceftriaxone alone if no MRSA or Pseudomonas  -> monitor pleural fluid cx  -> d/w Dr. 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  He has chronic "allergic" symptoms of rhinorrhea, cough that he uses OTC allergy medications for.  Around 2 weeks PTA he noticed worsening of his cough with shortness of breath.  Cough was productive of light to dark-brown sputum, occasionally blood-tinged.  Had no fever but had occasional mild chills and non-drenching sweats. Developed hoarseness which worsened around one week prior to admission and dyspnea worsened leading to his presentation at ER 5/26.   Afebrile but WBC count was 34.5 and CXR showed a moderated to large R loculated pleural effusion w/ atelectasis in underlying lung.  Left lung is clear.  Acute Respiratory Pathogen Panel was negative. Seen by Dr. Effie Shy of Cardiothoracic Surgery who offered Chest tube placement in ED but the patient declined so CT guided Chest tube placement was scheduled..  Vancomycin, Piperacillin-tazobactam and Doxycycline started 5/26.     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  He has no known exposure to TB, does not keep pet birds, is not exposed to poultry or other livestock.  He does not smoke cigarettes or vape but admits to occasional "weed".  Denies IV drug use       Current Facility-Administered Medications   Medication Dose Route Frequency Provider Last Rate Last Admin    heparin (porcine) injection 5,000 Units  5,000 Units SubCUTAneous BID Verlan Friends, MD   5,000 Units at 01/02/23 1023    sodium chloride flush 0.9 % injection 10 mL  10 mL IntraVENous PRN Berton , MD   10 mL at 01/02/23 0930    ALTEplase (CATHFLO) 10 mg in sodium chloride 0.9 % 30 mL  10 mg IntraPLEUral Q12H Swiderski, Iris J, PA-C  And    dornase alpha (PULMOZYME) 5 mg in sterile water 30 mL  5 mg IntraPLEUral Q12H Swiderski, Iris J, PA-C        oxyCODONE (ROXICODONE) immediate release tablet 5 mg  5 mg Oral Q4H PRN Swiderski, Iris J, PA-C   5 mg at 01/02/23 1637    docusate sodium (COLACE) capsule 100 mg  100 mg Oral BID Odelia Gage III, MD   100 mg at 01/01/23 2246    piperacillin-tazobactam (ZOSYN) 4,500 mg in sodium chloride 0.9 % 100 mL IVPB (mini-bag)  4,500 mg IntraVENous q8h Richarda Overlie, DO   Stopped at 01/02/23 1307    benzonatate (TESSALON) capsule 100 mg  100 mg Oral TID PRN Richarda Overlie, DO   100 mg at 01/01/23 2246    albuterol (ACCUNEB) nebulizer solution 1.25 mg  1.25 mg Nebulization Q6H PRN Richarda Overlie, DO        nystatin (MYCOSTATIN) powder   Topical BID  Richarda Overlie, DO   Given at 01/02/23 1135    senna (SENOKOT) tablet 8.6 mg  1 tablet Oral Nightly Hutchinson, Anne, DO   8.6 mg at 01/01/23 2246    polyethylene glycol (GLYCOLAX) packet 17 g  17 g Oral Daily Richarda Overlie, DO   17 g at 01/01/23 1610    furosemide (LASIX) injection 40 mg  40 mg IntraVENous BID Hutchinson, Anne, DO   40 mg at 01/02/23 1023    potassium chloride (KLOR-CON M) extended release tablet 20 mEq  20 mEq Oral Daily Hutchinson, Anne, DO   20 mEq at 01/02/23 0902    carvedilol (COREG) tablet 6.25 mg  6.25 mg Oral BID WC Hutchinson, Anne, DO   6.25 mg at 01/02/23 1637    sodium chloride flush 0.9 % injection 5-40 mL  5-40 mL IntraVENous 2 times per day Richarda Overlie, DO   10 mL at 01/02/23 9604    sodium chloride flush 0.9 % injection 5-40 mL  5-40 mL IntraVENous PRN Hutchinson, Anne, DO        0.9 % sodium chloride infusion   IntraVENous PRN Hutchinson, Anne, DO        acetaminophen (TYLENOL) tablet 650 mg  650 mg Oral Q6H PRN Richarda Overlie, DO   650 mg at 01/02/23 1637    Or    acetaminophen (TYLENOL) suppository 650 mg  650 mg Rectal Q6H PRN Hutchinson, Anne, DO        lidocaine 4 % external patch 1 patch  1 patch TransDERmal Daily Hutchinson, Anne, DO   1 patch at 01/02/23 0902          ROS:  Negative except as in interval history      Objective:    Vitals:    01/02/23 1415   BP:    Pulse: 75   Resp:    Temp:    SpO2: 91%          Physical Exam   Constitutional:       General: He is not in acute distress.     Appearance: He is obese. He is ill-appearing. He is not toxic-appearing.   HENT:      Head: Normocephalic and atraumatic.      Nose: Nose normal.      Mouth/Throat:      Pharynx: Oropharynx is clear.   Eyes:      Extraocular Movements: Extraocular movements intact.      Conjunctiva/sclera: Conjunctivae normal.   Cardiovascular:  Rate and Rhythm: Regular rhythm.      Heart sounds: No murmur heard.     No gallop.   Pulmonary:      Comments: Decreased breath sounds right  base, egophony right midlung.  No crackles or wheezes  Chest tube draining serosanguinous fluid.  Abdominal:      Palpations: Abdomen is soft.      Tenderness: There is no abdominal tenderness.   Musculoskeletal:      Cervical back: Neck supple.      Right lower leg: No edema.      Left lower leg: No edema.   Skin:     Findings: No rash.   Neurological:      Mental Status: He is alert and oriented to person, place, and time.   Psychiatric:         Mood and Affect: Mood normal.         Behavior: Behavior normal.     Recent Labs     01/01/23  0030 12/31/22  1433   WBC 36.2* 34.5*   HGB 14.4 14.2   HCT 44.6 42.5   MCV 87.3 86.6   PLT 444 419        Recent Labs     01/01/23  0030 12/31/22  1433   BUN 27* 30*   CREATININE 1.26 1.34*       Results       Procedure Component Value Units Date/Time    Culture, Body Fluid [[id number]] Collected: 01/01/23 1440    Order Status: Completed Specimen: Body fld Updated: 01/02/23 1250     Gram Stain Result Moderate WBC'S  No Organisms Seen        Culture Result No Growth To Date       Culture, Respiratory [[id number]]  (Abnormal) Collected: 12/31/22 1907    Order Status: Completed Specimen: RESPIRATORY Updated: 01/02/23 1036     Gram Stain Result       <10 Epithelial cells/lpf  >25 WBC's/lpf  Rare Gram Positive Cocci  Rare Gram Variable Bacilli       Culture Result       Moderate Commensal Flora Isolated After 48 Hours          Culture, Urine [[id number]]  (Abnormal) Collected: 12/31/22 1730    Order Status: Completed Specimen: Urine Updated: 01/02/23 0831     Culture Result --        60,000 CFU/mL  Skin and/or genital contamination      Legionella antigen, urine [[id number]] Collected: 12/31/22 1656    Order Status: Completed Specimen: Urine Updated: 12/31/22 1746     Legionella Urinary Ag NEGATIVE        Comment: Presumptive negative for L. pneumophila serogroup 1 antigen in urine, suggesting no recent or  current infection. Infection due to Legionella cannot be ruled out since  other serogroups and  species may cause disease, antigen may not be present in urine in early infection and the level  of antigen present in the urine may be below the detection limit of the test.           Strep Pneumoniae Antigen [[id number]] Collected: 12/31/22 1656    Order Status: Completed Specimen: Urine Updated: 12/31/22 1746     STREP PNEUMONIAE ANTIGEN, URINE NEGATIVE        Comment: Presumptive negative for pneumococcal pneumonia, suggesting no current or recent pneumococcal  infection.  Infection due to S. pneumoniae cannot be ruled out since the antigen present in the  sample  may be below the detection limit of the test.         Blood Culture 1 [[id number]] Collected: 12/31/22 1637    Order Status: Completed Specimen: Blood Updated: 01/02/23 0711     BLOOD CULTURE RESULT No Growth At 24 Hours       Blood Culture 2 [[id number]] Collected: 12/31/22 1637    Order Status: Completed Specimen: Blood Updated: 01/02/23 0711     BLOOD CULTURE RESULT No Growth At 24 Hours       Respiratory Pathogen Panel [[id number]] Collected: 12/31/22 1545    Order Status: Completed Specimen: Nasopharyngeal Swab Updated: 12/31/22 1940     Adenovirus NOT DETECTED        Coronavirus 299E NOT DETECTED        Coronavirus HKU1 NOT DETECTED        Coronavirus NL63 NOT DETECTED        Coronavirus OC43 NOT DETECTED        Human Metapneumovirus NOT DETECTED        INFLUENZA A H1 NOT DETECTED        Influenza H1N1 NOT DETECTED        Influenza A PCR NOT DETECTED        Influenza B PCR NOT DETECTED        Influenza A H3 NOT DETECTED        Parainfluenza 1 NOT DETECTED        Parainfluenza 2 NOT DETECTED        Parainfluenza 3 NOT DETECTED        Parainfluenza NOT DETECTED        Rhinovirus/Enterovirus NOT DETECTED        RSV PCR NOT DETECTED        Bordetella Pertussis NOT DETECTED        Chlamydophilia pneumoniae NOT DETECTED        Mycoplasma pneumoniae NOT DETECTED        SARS-CoV-2 NOT DETECTED        Comment: A Not Detected  (negative) test result for this test means that SARS- CoV-2 RNA was not present  in the specimen above the limit of detection. However, a negative result does not rule out the  possibility of COVID-19 and should not be used as the sole basis for treatment or patient  management decisions.   This test for SARS-CoV2 has been authorized by FDA under an Emergency Use Authorization (EUA).         Rapid Strep Screen [[id number]] Collected: 12/31/22 1545    Order Status: Completed Specimen: Throat Updated: 12/31/22 1747     Strep A Ag       Negative - No Streptococcus Group A DNA was Detected.           Comment: NEGATIVE Streptococcus group A PCR do NOT REFLEX to culture since PCR is very sensitive (equal  to culture for Strep. group A), however, if pharyngitis is suspected by another beta hemolytic  Streptococcus group then you must order a throat culture. Call microbiology ext [phone] if culture  is needed to be added on for testing.         MRSA by PCR [[id number]] Collected: 12/31/22 1545    Order Status: Completed Specimen: Nares Updated: 12/31/22 1852     MRSA PCR screen NEGATIVE                    Jerilynn Som, MD, FIDSA  [facility]

## 2023-01-02 NOTE — Procedures (Cosign Needed)
Procedure Note  Right Chemical Pleurodesis    Patient: Thomas Browning MRN: [id number]  SSN: [id number]    Date of Birth: 12-16-1965  Age: 57 y.o.  Sex: male      Date of Procedure: 01/02/2023     Pre-procedure Diagnosis: Right Empyema    Post-procedure Diagnosis: Right Empyema    Procedure: Chemical pleurodesis.  Instillation of TPA/Pulmozyme via right chest tube.    Indications:      Procedure details:  The procedure and potential risks including bleeding with the possibility of transfusion requirement were explained to the patient.  Timeout was performed.  Under sterile conditions the connection between the chest tube and the Pleur-evac was disconnected.  5 mg of Pulmozyme which had been appropriately diluted by the pharmacist was injected into the chest tube.  Then, 10 mg of TPA which had been appropriately diluted by the pharmacist was injected into the chest tube.  Then, 10 cc of sterile saline was injected into the chest tube.  The chest tube was reconnected to the Pleur-evac.  The Pleur-evac tubing was clamped.  Orders were written. Patient tolerated the procedure well.    Performed By: Dr. Dianne Dun [place], PA-C     Anesthesia: None    Estimated Blood Loss: None    Specimens: None    Findings: Fluids injected easily.    Complications: None    Implants: None    Recommendations: Clamp chest tube for 4 hours.  Then return to suction.    Signed By: Felix Pacini, PA-C     Jan 02, 2023        PROCEDURE NOTE  Date: 01/02/2023   Name: Thomas Browning  Date of Birth: [birth year]    Procedures

## 2023-01-02 NOTE — Progress Notes (Signed)
[facility]     Name: Thomas Browning MRN: [id number]   DOB: [birth year] Hospital: [facility]   Date: 01/02/2023        IMPRESSION:   Large loculated right pleural effusion noticed on chest x-ray obtained on 12/31/2022, and CT of the chest obtained on 01/01/2023..  Empyema versus parapneumonic effusion.  Elevated procalcitonin level.  Negative acute respiratory viral pathogen panel and urine antigen.  S/p CT-guided right pigtail catheter placement on 01/01/2023.  Initially 300 cc of dark yellow bloody effusion drained.  Since approximately 250 cc pleural fluid has been drained.  Acute on chronic systolic congestive heart failure.  Last echo obtained in October 2021 demonstrated LVEF around 45 to 50%.  Sepsis likely from pulmonary origin.  Hypertensive history present on hospitalization.  Prolonged QTc.  Suspected chronic kidney disease with baseline creatinine around 1.2-1.4.  NIDDM type II, last HbA1c 6.3 in 2021.  Morbid obesity.  Medical noncompliance      PLAN:   Currently on supplemental oxygen through nasal cannula.  Titrate, and target oxygen saturation greater than 88%.  Continue vancomycin,  and Zosyn.  Follow ID recommendations  Follow-up pleural fluid analysis, culture, and cytology  Chest tube management as per thoracic surgery  Continue Lasix 40 mg IV twice a day.  Follow ins and out.  Follow renal function.  Avoid nephrotoxic agent.  Continue carvedilol 6.25 mg twice a day.  May need dose adjustment.  DVT prophylaxis: Start subcu heparin     Subjective/Interval History:         Review of Systems   Respiratory:  Positive for shortness of breath.    Cardiovascular:  Positive for chest pain.   All other systems reviewed and are negative.        Objective:   Vital Signs:    BP (!) 124/94   Pulse 81   Temp 97.8 F (36.6 C) (Temporal)   Resp 21   Ht 1.803 m (5\' 11" )   Wt (!) 154.3 kg (340 lb 2.7 oz)   SpO2 (!) 89%   BMI 47.44 kg/m              Temp (24hrs), Avg:97.5 F (36.4 C), Min:97.3 F (36.3 C), Max:97.8 F (36.6 C)       Intake/Output:   Last shift:      No intake/output data recorded.  Last 3 shifts: 05/26 1901 - 05/28 0700  In: 403.7 [I.V.:102]  Out: 2520 [Urine:2290]    Intake/Output Summary (Last 24 hours) at 01/02/2023 0925  Last data filed at 01/02/2023 0100  Gross per 24 hour   Intake 393.66 ml   Output 1680 ml   Net -1286.34 ml        Physical Exam  Vitals and nursing note reviewed.   Eyes:      Pupils: Pupils are equal, round, and reactive to light.   Cardiovascular:      Rate and Rhythm: Regular rhythm.   Pulmonary:      Comments: Reduced air entry on right side  + Right chest tube   Skin:     General: Skin is warm and dry.   Neurological:      General: No focal deficit present.      Mental Status: He is alert and oriented to person, place, and time.             DATA:  Labs:  Recent Labs     12/31/22  1433 01/01/23  0030   WBC 34.5* 36.2*   HGB 14.2 14.4   HCT 42.5 44.6   PLT 419 444     Recent Labs     12/31/22  1433 12/31/22  1654 01/01/23  0030   NA 134*  --  135*   K 3.9  --  3.9   CL 99  --  100   CO2 25  --  22   BUN 30*  --  27*   ALT 27  --  27   INR  --  1.7* 1.6*     Recent Labs     01/01/23  0038   PH 7.362   PCO2 42.6   PO2 102.0*   HCO3 24.2          Results       Procedure Component Value Units Date/Time    Culture, Body Fluid [[id number]] Collected: 01/01/23 1440    Order Status: Completed Specimen: Body fld Updated: 01/01/23 2013     Gram Stain Result Moderate WBC'S  No Organisms Seen       Culture, Respiratory [[id number]]  (Abnormal) Collected: 12/31/22 1907    Order Status: Completed Specimen: RESPIRATORY Updated: 01/01/23 1043     Gram Stain Result       <10 Epithelial cells/lpf  >25 WBC's/lpf  Rare Gram Positive Cocci  Rare Gram Variable Bacilli       Culture Result       Moderate Commensal Flora Isolated After 24 Hours          Culture, Urine [[id number]]  (Abnormal) Collected: 12/31/22 1730    Order Status:  Completed Specimen: Urine Updated: 01/02/23 0831     Culture Result --        60,000 CFU/mL  Skin and/or genital contamination      Legionella antigen, urine [[id number]] Collected: 12/31/22 1656    Order Status: Completed Specimen: Urine Updated: 12/31/22 1746     Legionella Urinary Ag NEGATIVE        Comment: Presumptive negative for L. pneumophila serogroup 1 antigen in urine, suggesting no recent or  current infection. Infection due to Legionella cannot be ruled out since other serogroups and  species may cause disease, antigen may not be present in urine in early infection and the level  of antigen present in the urine may be below the detection limit of the test.           Strep Pneumoniae Antigen [[id number]] Collected: 12/31/22 1656    Order Status: Completed Specimen: Urine Updated: 12/31/22 1746     STREP PNEUMONIAE ANTIGEN, URINE NEGATIVE        Comment: Presumptive negative for pneumococcal pneumonia, suggesting no current or recent pneumococcal  infection.  Infection due to S. pneumoniae cannot be ruled out since the antigen present in the  sample may be below the detection limit of the test.         Blood Culture 1 [[id number]] Collected: 12/31/22 1637    Order Status: Completed Specimen: Blood Updated: 01/02/23 0711     BLOOD CULTURE RESULT No Growth At 24 Hours       Blood Culture 2 [[id number]] Collected: 12/31/22 1637    Order Status: Completed Specimen: Blood Updated: 01/02/23 0711     BLOOD CULTURE RESULT No Growth At 24 Hours       Respiratory Pathogen Panel [[id number]] Collected: 12/31/22 1545    Order Status: Completed Specimen: Nasopharyngeal Swab Updated: 12/31/22 1940     Adenovirus NOT DETECTED  Coronavirus 299E NOT DETECTED        Coronavirus HKU1 NOT DETECTED        Coronavirus NL63 NOT DETECTED        Coronavirus OC43 NOT DETECTED        Human Metapneumovirus NOT DETECTED        INFLUENZA A H1 NOT DETECTED        Influenza H1N1 NOT DETECTED        Influenza A PCR NOT DETECTED         Influenza B PCR NOT DETECTED        Influenza A H3 NOT DETECTED        Parainfluenza 1 NOT DETECTED        Parainfluenza 2 NOT DETECTED        Parainfluenza 3 NOT DETECTED        Parainfluenza NOT DETECTED        Rhinovirus/Enterovirus NOT DETECTED        RSV PCR NOT DETECTED        Bordetella Pertussis NOT DETECTED        Chlamydophilia pneumoniae NOT DETECTED        Mycoplasma pneumoniae NOT DETECTED        SARS-CoV-2 NOT DETECTED        Comment: A Not Detected (negative) test result for this test means that SARS- CoV-2 RNA was not present  in the specimen above the limit of detection. However, a negative result does not rule out the  possibility of COVID-19 and should not be used as the sole basis for treatment or patient  management decisions.   This test for SARS-CoV2 has been authorized by FDA under an Emergency Use Authorization (EUA).         Rapid Strep Screen [[id number]] Collected: 12/31/22 1545    Order Status: Completed Specimen: Throat Updated: 12/31/22 1747     Strep A Ag       Negative - No Streptococcus Group A DNA was Detected.           Comment: NEGATIVE Streptococcus group A PCR do NOT REFLEX to culture since PCR is very sensitive (equal  to culture for Strep. group A), however, if pharyngitis is suspected by another beta hemolytic  Streptococcus group then you must order a throat culture. Call microbiology ext [phone] if culture  is needed to be added on for testing.         MRSA by PCR [[id number]] Collected: 12/31/22 1545    Order Status: Completed Specimen: Nares Updated: 12/31/22 1852     MRSA PCR screen NEGATIVE                 Imaging:   CT Result (most recent):  CT CHEST W CONTRAST 01/01/2023    Narrative  History: SOB- to be done after the Chest tube placement on 5/27. Loculated right  pleural effusion.    Impression  Impression: Loculated right pleural effusion with consolidation in the right  lower lobe.    Comment: CT images of the chest were performed with intravenous contrast.   This  was compared with yesterday's chest x-ray.    The aorta is of normal caliber. The heart is of normal size.  No pericardial  effusion.    No hilar or mediastinal masses are identified.  No adenopathy.    Loop right chest tube is now in place. There is a loculated right pleural  effusion against the posterolateral chest wall. Atelectasis and consolidation is  present in the underlying lung. Whether this represents pneumonia or malignancy  is uncertain. No pneumothorax.    There is mild subsegmental atelectasis at the left lung base.      All CT exams at this facility use one or more dose reduction techniques  including automatic exposure control, mA/kV adjustment per patient's size, or  iterative reconstruction technique.    Electronically signed by: Scheryl Darter, MD 01/01/2023 4:52 PM EDT  Workstation ID: [id number]            Verlan Friends, MD

## 2023-01-02 NOTE — Progress Notes (Addendum)
[facility] Progress Note    Thomas Browning 57 y.o.  DOB: December 04, 1965  MRN: [id number]  SSN: [id number]      PCP: Unknown, Provider, APRN - NP  Cardiologist:    DOA: 12/31/2022     Impression:   Cardiomyopathy  Echo 5/58/24 LVEF 39%, grade II diastolic dysfunction, moderate to severe RV dilation, est RVSP 47 mmHg  Right pleural effusion  Status post right pigtail catheter placement 01/01/2023    Leukocytosis  Up to 36 K with neutrophilia  Blood culture, pleural fluid culture negative  Urine culture consistent with contamination.  Respiratory culture with moderate commensal flora  Recommendations:   Cardiomyopathy  Continue carvedilol.  Add losartan 25 mg daily.  Continue IV furosemide for now.      Reason for follow-up visit:   Follow up visit for dyspnea, prolonged QTc      Interval history:   Stable hemodyanmics  BP stable    Review of Systems:   General: Feeling well.  No fatigue.  No fever.  No chills.  Pulmonary: Denies cough, denies shortness of breath  Cardiovascular: Denies chest pain.  Denies palpitations    Home Medications:     Current Facility-Administered Medications   Medication Dose Route Frequency Provider Last Rate Last Admin    heparin (porcine) injection 5,000 Units  5,000 Units SubCUTAneous BID Verlan Friends, MD   5,000 Units at 01/02/23 1023    sodium chloride flush 0.9 % injection 10 mL  10 mL IntraVENous PRN Berton , MD   10 mL at 01/02/23 0930    ALTEplase (CATHFLO) 10 mg in sodium chloride 0.9 % 30 mL  10 mg IntraPLEUral Q12H Swiderski, Iris J, PA-C        And    dornase alpha (PULMOZYME) 5 mg in sterile water 30 mL  5 mg IntraPLEUral Q12H Swiderski, Iris J, PA-C        oxyCODONE (ROXICODONE) immediate release tablet 5 mg  5 mg Oral Q4H PRN Swiderski, Iris J, PA-C   5 mg at 01/02/23 1637    bisacodyl (DULCOLAX) suppository 10 mg  10 mg Rectal Daily PRN Theodis Aguas, MD        Magnesium Citrate solution 296 mL  296 mL Oral Once PRN Theodis Aguas, MD         docusate sodium (COLACE) capsule 100 mg  100 mg Oral BID Odelia Gage III, MD   100 mg at 01/01/23 2246    piperacillin-tazobactam (ZOSYN) 4,500 mg in sodium chloride 0.9 % 100 mL IVPB (mini-bag)  4,500 mg IntraVENous q8h Richarda Overlie, DO   Stopped at 01/02/23 1307    benzonatate (TESSALON) capsule 100 mg  100 mg Oral TID PRN Richarda Overlie, DO   100 mg at 01/01/23 2246    albuterol (ACCUNEB) nebulizer solution 1.25 mg  1.25 mg Nebulization Q6H PRN Richarda Overlie, DO        nystatin (MYCOSTATIN) powder   Topical BID Richarda Overlie, DO   Given at 01/02/23 1135    senna (SENOKOT) tablet 8.6 mg  1 tablet Oral Nightly Hutchinson, Anne, DO   8.6 mg at 01/01/23 2246    polyethylene glycol (GLYCOLAX) packet 17 g  17 g Oral Daily Hutchinson, Anne, DO   17 g at 01/01/23 0925    furosemide (LASIX) injection 40 mg  40 mg IntraVENous BID Hutchinson, Anne, DO   40 mg at 01/02/23 1023    potassium chloride (KLOR-CON M) extended release tablet 20 mEq  20 mEq Oral Daily Richarda Overlie, DO   20 mEq at 01/02/23 0902    carvedilol (COREG) tablet 6.25 mg  6.25 mg Oral BID WC Hutchinson, Anne, DO   6.25 mg at 01/02/23 1637    sodium chloride flush 0.9 % injection 5-40 mL  5-40 mL IntraVENous 2 times per day Richarda Overlie, DO   10 mL at 01/02/23 1610    sodium chloride flush 0.9 % injection 5-40 mL  5-40 mL IntraVENous PRN Hutchinson, Anne, DO        0.9 % sodium chloride infusion   IntraVENous PRN Richarda Overlie, DO        acetaminophen (TYLENOL) tablet 650 mg  650 mg Oral Q6H PRN Richarda Overlie, DO   650 mg at 01/02/23 1637    Or    acetaminophen (TYLENOL) suppository 650 mg  650 mg Rectal Q6H PRN Richarda Overlie, DO        lidocaine 4 % external patch 1 patch  1 patch TransDERmal Daily Hutchinson, Anne, DO   1 patch at 01/02/23 0902       Physical Examination:     Patient Vitals for the past 6 hrs:   Temp Temp src Pulse SpO2   01/02/23 1415 -- -- 75 91 %   01/02/23 1404 98 F (36.7 C) Temporal -- --    01/02/23 1400 -- -- 78 94 %   01/02/23 1345 -- -- 74 93 %   01/02/23 1330 -- -- 74 94 %         Intake/Output Summary (Last 24 hours) at 01/02/2023 1731  Last data filed at 01/02/2023 0100  Gross per 24 hour   Intake 53.33 ml   Output 550 ml   Net -496.67 ml       General appearance: The patient is well-appearing in no acute distress  Head: Normocephalic, atraumatic  Eyes: Anicteric sclera, EOMI  Neck: Supple, the JVP is normal  Lungs: Normal chest excursion, clear to auscultation bilaterally, no accessory muscle use  Heart: S1 normal, S2 normal, normal rate, regular rhythm, no murmur appreciated  Abdomen: Soft, nontender  Extremities: Warm, no edema  Skin: Turgor is normal, no obvious rash or ecchymosis    My personal review of the telemetry tracing: Sinus rhythm    Labs:    GFR: Estimated Creatinine Clearance: 98 mL/min (based on SCr of 1.26 mg/dL).     Lab Results   Component Value Date/Time    WBC 36.2 (H) 01/01/2023 12:30 AM    RBC 5.11 01/01/2023 12:30 AM    HGB 14.4 01/01/2023 12:30 AM    HCT 44.6 01/01/2023 12:30 AM    MCV 87.3 01/01/2023 12:30 AM    MCH 28.2 01/01/2023 12:30 AM    MCHC 32.3 01/01/2023 12:30 AM    RDW 45.7 (H) 01/01/2023 12:30 AM    PLT 444 01/01/2023 12:30 AM    MPV 9.6 01/01/2023 12:30 AM       Lab Results   Component Value Date/Time    MONOS 6.0 01/01/2023 12:30 AM       Lab Results   Component Value Date    NA 135 (L) 01/01/2023    K 3.9 01/01/2023    CL 100 01/01/2023    CO2 22 01/01/2023    BUN 27 (H) 01/01/2023    CREATININE 1.26 01/01/2023    GLUCOSE 157 (H) 01/01/2023    CALCIUM 9.2 01/01/2023       Lab Results   Component Value Date  TROPHS 27 12/31/2022       No results found for: "TSH", "TSH2", "TSH3", "TSHELE", "T3RIA", "T3UP", "FT3", "FT4", "T4", "T4P", "TT7"     Lab Results   Component Value Date    INR 1.6 (H) 01/01/2023    APTT 34.6 12/31/2022        Lab Results   Component Value Date    LABA1C 5.8 (H) 12/31/2022       Lab Results   Component Value Date    CHOL 114  12/31/2022    HDL <20 (L) 12/31/2022    CHOLHDLRATIO 6.0 (H) 12/31/2022    TRIG 127 12/31/2022       Lab Results   Component Value Date    ALT 27 01/01/2023       Lab Results   Component Value Date    PHUR 5.5 12/31/2022    GLUCOSEU Negative 12/31/2022    KETUA 40 (A) 12/31/2022    BLOODU Trace-intact (A) 12/31/2022    NITRU Negative 12/31/2022    LEUKOCYTESUR Negative 12/31/2022    WBCUA 1-4 (A) 12/31/2022    RBCUA OCCASIONAL (A) 12/31/2022    BACTERIA 2+ (A) 12/31/2022         Burgess Estelle, MD  Pager: [phone]  Jan 02, 2023, 5:31 PM

## 2023-01-03 ENCOUNTER — Inpatient Hospital Stay: Admit: 2023-01-03 | Primary: Diagnostic Radiology

## 2023-01-03 DIAGNOSIS — J90 Pleural effusion, not elsewhere classified: Secondary | ICD-10-CM | POA: Diagnosis not present

## 2023-01-03 DIAGNOSIS — I10 Essential (primary) hypertension: Secondary | ICD-10-CM | POA: Diagnosis not present

## 2023-01-03 DIAGNOSIS — J302 Other seasonal allergic rhinitis: Secondary | ICD-10-CM | POA: Diagnosis not present

## 2023-01-03 LAB — CBC WITH AUTO DIFFERENTIAL
Atypical Lymphocytes: 2 % — ABNORMAL HIGH (ref 0–0)
Hematocrit: 40.9 % (ref 36.0–55.0)
Hemoglobin: 13.7 gm/dl (ref 13.0–18.0)
Lymphocytes: 12 % — ABNORMAL LOW (ref 28–48)
MCH: 29.3 pg (ref 23.0–34.6)
MCHC: 33.5 gm/dl (ref 30.0–36.0)
MCV: 87.6 fL (ref 80.0–98.0)
MPV: 9.1 fL (ref 6.0–10.0)
Metamyelocytes: 3 % — ABNORMAL HIGH (ref 0–0)
Monocytes: 5 % (ref 1–13)
Myelocytes: 5 % — ABNORMAL HIGH (ref 0–0)
Neutrophils Segmented: 73 % — ABNORMAL HIGH (ref 34–64)
Nucleated RBCs: 0 (ref 0–0)
Platelet Appearance: NORMAL
Platelets: 417 10*3/uL (ref 140–450)
RBC Morphology: NORMAL
RBC: 4.67 M/uL (ref 3.80–5.70)
RDW: 46.2 — ABNORMAL HIGH (ref 35.1–43.9)
WBC: 26.9 10*3/uL — ABNORMAL HIGH (ref 4.0–11.0)

## 2023-01-03 LAB — BASIC METABOLIC PANEL
Anion Gap: 7 mmol/L (ref 5–15)
BUN: 32 mg/dl — ABNORMAL HIGH (ref 9–23)
CO2: 27 mEq/L (ref 20–31)
Calcium: 8.4 mg/dl — ABNORMAL LOW (ref 8.7–10.4)
Chloride: 101 mEq/L (ref 98–107)
Creatinine: 1.21 mg/dl (ref 0.70–1.30)
GFR African American: >60
GFR Non-African American: >60
Glucose: 137 mg/dl — ABNORMAL HIGH (ref 74–106)
Potassium: 3.7 mEq/L (ref 3.5–5.1)
Sodium: 135 mEq/L — ABNORMAL LOW (ref 136–145)

## 2023-01-03 LAB — MAGNESIUM: Magnesium: 1.9 mg/dL (ref 1.6–2.6)

## 2023-01-03 LAB — POC GLUCOSE FINGERSTICK: POC Glucose: 115 mg/dL — ABNORMAL HIGH (ref 65–105)

## 2023-01-03 MED ORDER — SODIUM CHLORIDE 0.9 % IV SOLN (MINI-BAG)
0.9 % | INTRAVENOUS | Status: AC
Start: 2023-01-03 — End: 2023-01-05
  Administered 2023-01-04 (×2): 2000 mg via INTRAVENOUS

## 2023-01-03 MED ORDER — MORPHINE SULFATE (PF) 2 MG/ML IV SOLN
2 MG/ML | INTRAVENOUS | Status: AC | PRN
Start: 2023-01-03 — End: 2023-01-10
  Administered 2023-01-04 – 2023-01-10 (×14): 2 mg via INTRAVENOUS

## 2023-01-03 MED FILL — PIPERACILLIN SOD-TAZOBACTAM SO 4.5 (4-0.5) G IV SOLR: 4.5 (4-0.5) g | INTRAVENOUS | Qty: 4500

## 2023-01-03 MED FILL — HEPARIN SODIUM (PORCINE) 5000 UNIT/ML IJ SOLN: 5000 UNIT/ML | INTRAMUSCULAR | Qty: 1

## 2023-01-03 MED FILL — CARVEDILOL 6.25 MG PO TABS: 6.25 MG | ORAL | Qty: 1

## 2023-01-03 MED FILL — OXYCODONE HCL 5 MG PO TABS: 5 MG | ORAL | Qty: 1

## 2023-01-03 MED FILL — CATHFLO ACTIVASE 2 MG IJ SOLR: 2 MG | INTRAMUSCULAR | Qty: 10

## 2023-01-03 MED FILL — PULMOZYME 2.5 MG/2.5ML IN SOLN: 2.5 MG/ML | RESPIRATORY_TRACT | Qty: 5

## 2023-01-03 MED FILL — POLYETHYLENE GLYCOL 3350 17 G PO PACK: 17 g | ORAL | Qty: 1

## 2023-01-03 MED FILL — DOCUSATE SODIUM 100 MG PO CAPS: 100 MG | ORAL | Qty: 1

## 2023-01-03 MED FILL — GNP LIDOCAINE PAIN RELIEF 4 % EX PTCH: 4 % | CUTANEOUS | Qty: 1

## 2023-01-03 MED FILL — MORPHINE SULFATE 2 MG/ML IJ SOLN: 2 MG/ML | INTRAMUSCULAR | Qty: 1

## 2023-01-03 MED FILL — LOSARTAN POTASSIUM 25 MG PO TABS: 25 MG | ORAL | Qty: 1

## 2023-01-03 MED FILL — FUROSEMIDE 10 MG/ML IJ SOLN: 10 MG/ML | INTRAMUSCULAR | Qty: 4

## 2023-01-03 MED FILL — KLOR-CON M20 20 MEQ PO TBCR: 20 MEQ | ORAL | Qty: 1

## 2023-01-03 MED FILL — ACETAMINOPHEN 325 MG PO TABS: 325 MG | ORAL | Qty: 2

## 2023-01-03 MED FILL — GERI-KOT 8.6 MG PO TABS: 8.6 MG | ORAL | Qty: 1

## 2023-01-03 NOTE — Progress Notes (Addendum)
Medical Progress Note      NAME: Thomas Browning   DOB:  Apr 06, 1966  MRN:             [id number]    Date:  01/03/2023        Assessment:     Large loculated right pleural effusion  Acute hypoxic respiratory failure  Acute on chronic systolic, diastolic  Sepsis, suspect respiratory source  Hypertensive urgency  Prolonged QTc  DM 2,   Morbid obesity    Plan:     Patient given lytic therapy yesterday, tolerated well  Repeat chest x-ray today and continue lytic therapy per  thoracic surgery  Current IV antibiotics, vancomycin and Zosyn  ID following Patient  Monitor respiratory status  Continue supplemental oxygen and titrate as needed  Monitor renal function and electrolytes, replete as needed  Monitor blood glucose and continue insulin per Glucomander  Bowel Regimen for constipation  Patient has full CODE STATUS  DVT prophylaxis    Subjective:     Shortness of breathing, no fever or chest pain    Objective:     Vitals:    Last 24hrs VS reviewed since prior progress note. Most recent are:  Vitals:    01/03/23 1000   BP: 106/74   Pulse: 69   Resp: (!) 6   Temp:    SpO2: 97%     SpO2 Readings from Last 6 Encounters:   01/03/23 97%          Intake/Output Summary (Last 24 hours) at 01/03/2023 1216  Last data filed at 01/03/2023 1112  Gross per 24 hour   Intake 100 ml   Output 3900 ml   Net -3800 ml          Exam:     General:   Not in acute distress  HEENT: PERRLA, Neck Supple,  No JVD  Respiratory:   CTA bilaterally-no wheezes, rales, rhonchi, or crackles  Cardiac:  Regular Rate and Rythmn  - no murmurs, rubs or gallops  Abdominal:  Soft, non-tender, non-distended, positive bowel sounds  Extremities:  No cyanosis, or edema.  Skin: No rash  Neurological:  No focal neurological deficits    Medication:   Current Medications Reviewed    Current Facility-Administered Medications:     heparin (porcine) injection 5,000 Units, 5,000 Units, SubCUTAneous, BID, Verlan Friends, MD, 5,000 Units at 01/03/23 0941    sodium chloride flush 0.9  % injection 10 mL, 10 mL, IntraVENous, PRN, Williams Che, Mohsin, MD, 10 mL at 01/02/23 0930    ALTEplase (CATHFLO) 10 mg in sodium chloride 0.9 % 30 mL, 10 mg, IntraPLEUral, Q12H, 10 mg at 01/03/23 1004 **AND** dornase alpha (PULMOZYME) 5 mg in sterile water 30 mL, 5 mg, IntraPLEUral, Q12H, Swiderski, Iris J, PA-C, 5 mg at 01/03/23 1004    oxyCODONE (ROXICODONE) immediate release tablet 5 mg, 5 mg, Oral, Q4H PRN, Swiderski, Iris J, PA-C, 5 mg at 01/02/23 2236    bisacodyl (DULCOLAX) suppository 10 mg, 10 mg, Rectal, Daily PRN, Theodis Aguas, MD    Magnesium Citrate solution 296 mL, 296 mL, Oral, Once PRN, Tenny Craw, Meta Hatchet, MD    losartan (COZAAR) tablet 25 mg, 25 mg, Oral, Daily, McCray, Robert D, MD, 25 mg at 01/03/23 0940    docusate sodium (COLACE) capsule 100 mg, 100 mg, Oral, BID, Odelia Gage III, MD, 100 mg at 01/03/23 0940    piperacillin-tazobactam (ZOSYN) 4,500 mg in sodium chloride 0.9 % 100 mL IVPB (mini-bag), 4,500 mg, IntraVENous,  q8h, Richarda Overlie, DO, Last Rate: 25 mL/hr at 01/03/23 0954, 4,500 mg at 01/03/23 1610    benzonatate (TESSALON) capsule 100 mg, 100 mg, Oral, TID PRN, Hutchinson, Anne, DO, 100 mg at 01/01/23 2246    albuterol (ACCUNEB) nebulizer solution 1.25 mg, 1.25 mg, Nebulization, Q6H PRN, Hutchinson, Anne, DO    nystatin (MYCOSTATIN) powder, , Topical, BID, Hutchinson, Anne, DO, Given at 01/03/23 9604    senna (SENOKOT) tablet 8.6 mg, 1 tablet, Oral, Nightly, Hutchinson, Anne, DO, 8.6 mg at 01/01/23 2246    polyethylene glycol (GLYCOLAX) packet 17 g, 17 g, Oral, Daily, Hutchinson, Anne, DO, 17 g at 01/01/23 5409    furosemide (LASIX) injection 40 mg, 40 mg, IntraVENous, BID, Hutchinson, Anne, DO, 40 mg at 01/03/23 0943    potassium chloride (KLOR-CON M) extended release tablet 20 mEq, 20 mEq, Oral, Daily, Hutchinson, Anne, DO, 20 mEq at 01/03/23 0940    carvedilol (COREG) tablet 6.25 mg, 6.25 mg, Oral, BID WC, Hutchinson, Anne, DO, 6.25 mg at 01/03/23 0941    sodium chloride  flush 0.9 % injection 5-40 mL, 5-40 mL, IntraVENous, 2 times per day, Hutchinson, Anne, DO, 10 mL at 01/03/23 0947    sodium chloride flush 0.9 % injection 5-40 mL, 5-40 mL, IntraVENous, PRN, Hutchinson, Anne, DO    0.9 % sodium chloride infusion, , IntraVENous, PRN, Hutchinson, Anne, DO    acetaminophen (TYLENOL) tablet 650 mg, 650 mg, Oral, Q6H PRN, 650 mg at 01/02/23 2236 **OR** acetaminophen (TYLENOL) suppository 650 mg, 650 mg, Rectal, Q6H PRN, Hutchinson, Anne, DO    lidocaine 4 % external patch 1 patch, 1 patch, TransDERmal, Daily, Hutchinson, Anne, DO, 1 patch at 01/02/23 0902      Lab:     Lab Data Reviewed: (see below)  Recent Results (from the past 24 hour(s))   POC Glucose Fingerstick    Collection Time: 01/02/23  5:13 PM   Result Value Ref Range    POC Glucose 120 (H) 65 - 105 mg/dL   POC Glucose Fingerstick    Collection Time: 01/03/23 12:24 AM   Result Value Ref Range    POC Glucose 115 (H) 65 - 105 mg/dL   CBC with Auto Differential    Collection Time: 01/03/23  4:42 AM   Result Value Ref Range    WBC 26.9 (H) 4.0 - 11.0 1000/mm3    Neutrophils Segmented 73.0 (H) 34 - 64 %    RBC 4.67 3.80 - 5.70 M/uL    Lymphocytes 12.0 (L) 28 - 48 %    Hemoglobin 13.7 13.0 - 18.0 gm/dl    Monocytes 5.0 1 - 13 %    Hematocrit 40.9 36.0 - 55.0 %    MCV 87.6 80.0 - 98.0 fL    MCH 29.3 23.0 - 34.6 pg    MCHC 33.5 30.0 - 36.0 gm/dl    Metamyelocytes 3.0 (H) 0 - 0 %    Platelets 417 140 - 450 1000/mm3    Myelocytes 5.0 (H) 0 - 0 %    MPV 9.1 6.0 - 10.0 fL    RDW 46.2 (H) 35.1 - 43.9      Atypical Lymphocytes 2.0 (H) 0 - 0 %    Nucleated RBCs 0 0 - 0      RBC Morphology NORMAL      Platelet Appearance NORMAL     Basic Metabolic Panel    Collection Time: 01/03/23  4:42 AM   Result Value Ref Range    Potassium 3.7  3.5 - 5.1 mEq/L    Chloride 101 98 - 107 mEq/L    Sodium 135 (L) 136 - 145 mEq/L    CO2 27 20 - 31 mEq/L    Glucose 137 (H) 74 - 106 mg/dl    BUN 32 (H) 9 - 23 mg/dl    Creatinine 9.60 4.54 - 1.30 mg/dl    GFR  African American >60      GFR Non-African American >60      Calcium 8.4 (L) 8.7 - 10.4 mg/dl    Anion Gap 7 5 - 15 mmol/L   Magnesium    Collection Time: 01/03/23  4:42 AM   Result Value Ref Range    Magnesium 1.9 1.6 - 2.6 mg/dL       @[id number] @    Principal Problem:    Pleural effusion on right  Active Problems:    SOB (shortness of breath)  Resolved Problems:    * No resolved hospital problems. *    ____________________________________________________________________      Attending Physician: Theodis Aguas, MD       Dragon medical dictation software was used for portions of this report. Unintended voice recognition errors may occur.

## 2023-01-03 NOTE — Progress Notes (Signed)
[facility]   (A Division of [facility])    Follow up Note      Date of Admission: 12/31/2022    Date of Note: 01/03/23        Subjective / Interval History:  Sitting up in chair, breathing better but complaining of pain from chest tube.  No pain with instillation of TPA/Pulmozyme.  Still constipated.           Current Antimicrobials:    Prior Antimicrobials:  Ceftriaxone 5/29 - 0  Doxycycline 5/26 - 1  Vanc 5/26 - 1 Piptazo 5/26 - 3          Assessment:  Rec / Plan:   Right Pleural effusion, parapneumonic  - two weeks worsening cough, some mild chills, sweats, no fever, + hoarseness  - WBC count 34.5  - CXR 5/26:  mod to large R loculated pleural effusion w/ atelectasis in underlying lung  - Urag neg Legionella, S pneumo  - refused CT placement in ER 5/26  - s/p 5/27 CT guided R chest tube placement - 300 cc dark yellow, mildly bloody fluid. GS mod wbc's, NOS cx NGTD (2 days)  - CT Chest 5/28: moderate loculated right pleural effusion with atelectasis -> dc piptazo   -> ceftriaxone 2 gm IV q 24 hours  -> monitor pleural fluid cx  -> d/w Dr. 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  He has chronic "allergic" symptoms of rhinorrhea, cough that he uses OTC allergy medications for.  Around 2 weeks PTA he noticed worsening of his cough with shortness of breath.  Cough was productive of light to dark-brown sputum, occasionally blood-tinged.  Had no fever but had occasional mild chills and non-drenching sweats. Developed hoarseness which worsened around one week prior to admission and dyspnea worsened leading to his presentation at ER 5/26.  Afebrile but WBC count was 34.5 and CXR showed a moderated to large R loculated pleural effusion w/ atelectasis in underlying lung.  Left lung is clear.   Acute Respiratory Pathogen Panel was negative. Seen by Dr. Effie Shy of Cardiothoracic Surgery who offered Chest tube placement in ED but the patient declined so CT guided Chest tube placement was scheduled.  Vancomycin, Piperacillin-tazobactam and Doxycycline started 5/26.     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  He has no known exposure to TB, does not keep pet birds, is not exposed to poultry or other livestock.  He does not smoke cigarettes or vape but admits to occasional "weed".  Denies IV drug use       Current Facility-Administered Medications   Medication Dose Route Frequency Provider Last Rate Last Admin    morphine (PF) injection 2 mg  2 mg IntraVENous Q4H PRN Theodis Aguas, MD        heparin (porcine) injection 5,000 Units  5,000 Units SubCUTAneous BID Verlan Friends, MD   5,000 Units at 01/03/23 0941    sodium chloride flush 0.9 % injection 10 mL  10 mL IntraVENous PRN Berton Montgomery, MD   10 mL at 01/02/23 0930    ALTEplase (CATHFLO) 10 mg in sodium chloride 0.9 % 30 mL  10 mg IntraPLEUral Q12H Swiderski, Iris  J, PA-C   10 mg at 01/03/23 1004    And    dornase alpha (PULMOZYME) 5 mg in sterile water 30 mL  5 mg IntraPLEUral Q12H Swiderski, Iris J, PA-C   5 mg at 01/03/23 1004    oxyCODONE (ROXICODONE) immediate release tablet 5 mg  5 mg Oral Q4H PRN Swiderski, Iris J, PA-C   5 mg at 01/03/23 1245    bisacodyl (DULCOLAX) suppository 10 mg  10 mg Rectal Daily PRN Theodis Aguas, MD        losartan (COZAAR) tablet 25 mg  25 mg Oral Daily Burgess Estelle, MD   25 mg at 01/03/23 0940    docusate sodium (COLACE) capsule 100 mg  100 mg Oral BID Odelia Gage III, MD   100 mg at 01/03/23 0940    piperacillin-tazobactam (ZOSYN) 4,500 mg in sodium chloride 0.9 % 100 mL IVPB (mini-bag)  4,500 mg IntraVENous q8h Richarda Overlie, DO   Stopped at 01/03/23 1436    benzonatate (TESSALON) capsule 100 mg  100 mg Oral TID PRN Richarda Overlie, DO   100 mg at 01/01/23 2246     albuterol (ACCUNEB) nebulizer solution 1.25 mg  1.25 mg Nebulization Q6H PRN Richarda Overlie, DO        nystatin (MYCOSTATIN) powder   Topical BID Richarda Overlie, DO   Given at 01/03/23 0954    senna (SENOKOT) tablet 8.6 mg  1 tablet Oral Nightly Hutchinson, Anne, DO   8.6 mg at 01/01/23 2246    polyethylene glycol (GLYCOLAX) packet 17 g  17 g Oral Daily Richarda Overlie, DO   17 g at 01/01/23 1027    furosemide (LASIX) injection 40 mg  40 mg IntraVENous BID Richarda Overlie, DO   40 mg at 01/03/23 0943    potassium chloride (KLOR-CON M) extended release tablet 20 mEq  20 mEq Oral Daily Hutchinson, Anne, DO   20 mEq at 01/03/23 0940    carvedilol (COREG) tablet 6.25 mg  6.25 mg Oral BID WC Hutchinson, Anne, DO   6.25 mg at 01/03/23 0941    sodium chloride flush 0.9 % injection 5-40 mL  5-40 mL IntraVENous 2 times per day Richarda Overlie, DO   10 mL at 01/03/23 0947    sodium chloride flush 0.9 % injection 5-40 mL  5-40 mL IntraVENous PRN Hutchinson, Anne, DO        0.9 % sodium chloride infusion   IntraVENous PRN Hutchinson, Anne, DO        acetaminophen (TYLENOL) tablet 650 mg  650 mg Oral Q6H PRN Richarda Overlie, DO   650 mg at 01/03/23 1245    Or    acetaminophen (TYLENOL) suppository 650 mg  650 mg Rectal Q6H PRN Hutchinson, Anne, DO        lidocaine 4 % external patch 1 patch  1 patch TransDERmal Daily Hutchinson, Anne, DO   1 patch at 01/02/23 0902          ROS:  Negative except as in interval history      Objective:    Vitals:    01/03/23 1600   BP: 118/62   Pulse: 63   Resp: 18   Temp:    SpO2: 91%          Physical Exam   Constitutional:       General: He is not in acute distress.     Appearance: He is obese. He is not toxic-appearing.   HENT:  Head: Normocephalic and atraumatic.      Nose: Nose normal.      Mouth/Throat:      Pharynx: Oropharynx is clear.   Eyes:      Extraocular Movements: Extraocular movements intact.      Conjunctiva/sclera: Conjunctivae normal.   Cardiovascular:      Rate and  Rhythm: Regular rhythm.      Heart sounds: No murmur heard.     No gallop.   Pulmonary:      Comments: improved breath sounds right base, egophony right midlung.  few crackles R midlung no wheezes  Chest tube draining serosanguinous fluid.  Abdominal:      Palpations: Abdomen is soft.      Tenderness: There is no abdominal tenderness.   Musculoskeletal:      Cervical back: Neck supple.      Right lower leg: No edema.      Left lower leg: No edema.   Skin:     Findings: No rash.   Neurological:      Mental Status: He is alert and oriented to person, place, and time.   Psychiatric:         Mood and Affect: Mood normal.         Behavior: Behavior normal.     Recent Labs     01/03/23  0442 01/01/23  0030 12/31/22  1433   WBC 26.9* 36.2* 34.5*   HGB 13.7 14.4 14.2   HCT 40.9 44.6 42.5   MCV 87.6 87.3 86.6   PLT 417 444 419          Recent Labs     01/03/23  0442 01/01/23  0030 12/31/22  1433   BUN 32* 27* 30*   CREATININE 1.21 1.26 1.34*         Results       Procedure Component Value Units Date/Time    Culture, Body Fluid [[id number]] Collected: 01/01/23 1440    Order Status: Completed Specimen: Body fld Updated: 01/03/23 1238     Gram Stain Result Moderate WBC'S  No Organisms Seen        Culture Result No Growth at 2 days       Culture, Respiratory [[id number]]  (Abnormal) Collected: 12/31/22 1907    Order Status: Completed Specimen: RESPIRATORY Updated: 01/03/23 1154     Gram Stain Result       <10 Epithelial cells/lpf  >25 WBC's/lpf  Rare Gram Positive Cocci  Rare Gram Variable Bacilli       Culture Result       Moderate Commensal Flora Isolated          Culture, Urine [[id number]]  (Abnormal) Collected: 12/31/22 1730    Order Status: Completed Specimen: Urine Updated: 01/02/23 0831     Culture Result --        60,000 CFU/mL  Skin and/or genital contamination      Legionella antigen, urine [[id number]] Collected: 12/31/22 1656    Order Status: Completed Specimen: Urine Updated: 12/31/22 1746     Legionella Urinary Ag  NEGATIVE        Comment: Presumptive negative for L. pneumophila serogroup 1 antigen in urine, suggesting no recent or  current infection. Infection due to Legionella cannot be ruled out since other serogroups and  species may cause disease, antigen may not be present in urine in early infection and the level  of antigen present in the urine may be below the detection limit of the test.  Strep Pneumoniae Antigen [[id number]] Collected: 12/31/22 1656    Order Status: Completed Specimen: Urine Updated: 12/31/22 1746     STREP PNEUMONIAE ANTIGEN, URINE NEGATIVE        Comment: Presumptive negative for pneumococcal pneumonia, suggesting no current or recent pneumococcal  infection.  Infection due to S. pneumoniae cannot be ruled out since the antigen present in the  sample may be below the detection limit of the test.         Blood Culture 1 [[id number]] Collected: 12/31/22 1637    Order Status: Completed Specimen: Blood Updated: 01/03/23 0711     BLOOD CULTURE RESULT No Growth At 48 Hours       Blood Culture 2 [[id number]] Collected: 12/31/22 1637    Order Status: Completed Specimen: Blood Updated: 01/03/23 0711     BLOOD CULTURE RESULT No Growth At 48 Hours       Respiratory Pathogen Panel [[id number]] Collected: 12/31/22 1545    Order Status: Completed Specimen: Nasopharyngeal Swab Updated: 12/31/22 1940     Adenovirus NOT DETECTED        Coronavirus 299E NOT DETECTED        Coronavirus HKU1 NOT DETECTED        Coronavirus NL63 NOT DETECTED        Coronavirus OC43 NOT DETECTED        Human Metapneumovirus NOT DETECTED        INFLUENZA A H1 NOT DETECTED        Influenza H1N1 NOT DETECTED        Influenza A PCR NOT DETECTED        Influenza B PCR NOT DETECTED        Influenza A H3 NOT DETECTED        Parainfluenza 1 NOT DETECTED        Parainfluenza 2 NOT DETECTED        Parainfluenza 3 NOT DETECTED        Parainfluenza NOT DETECTED        Rhinovirus/Enterovirus NOT DETECTED        RSV PCR NOT DETECTED         Bordetella Pertussis NOT DETECTED        Chlamydophilia pneumoniae NOT DETECTED        Mycoplasma pneumoniae NOT DETECTED        SARS-CoV-2 NOT DETECTED        Comment: A Not Detected (negative) test result for this test means that SARS- CoV-2 RNA was not present  in the specimen above the limit of detection. However, a negative result does not rule out the  possibility of COVID-19 and should not be used as the sole basis for treatment or patient  management decisions.   This test for SARS-CoV2 has been authorized by FDA under an Emergency Use Authorization (EUA).         Rapid Strep Screen [[id number]] Collected: 12/31/22 1545    Order Status: Completed Specimen: Throat Updated: 12/31/22 1747     Strep A Ag       Negative - No Streptococcus Group A DNA was Detected.           Comment: NEGATIVE Streptococcus group A PCR do NOT REFLEX to culture since PCR is very sensitive (equal  to culture for Strep. group A), however, if pharyngitis is suspected by another beta hemolytic  Streptococcus group then you must order a throat culture. Call microbiology ext [phone] if culture  is needed to be added on for testing.  MRSA by PCR [[id number]] Collected: 12/31/22 1545    Order Status: Completed Specimen: Nares Updated: 12/31/22 1852     MRSA PCR screen NEGATIVE                    Jerilynn Som, MD, FIDSA  [facility]

## 2023-01-03 NOTE — Care Coordination-Inpatient (Addendum)
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 Goes home to [place] on the weekends to mothers home. Address is [address] [place]. Monitor for any needs.  Patient will need INDIGENT medications at discharge.     Transport at discharge: Friend will transport home     Patient will choose a home health agency at discharge if needed. Yes     Home address  verified   YES     Phone number verified   YES      No PCP, List of [facility] medical group offices provided to patient  N/A Patient does not have a PCP but lives in [place].     TCC referral for primary if needed for home health and follow up. N/A     PCP- date of last visit No PCP      Insurance verified   YES Mr. Patient does not have any insurance. E mail message sent to [facility] Medicaid screener.          Patient is agreeable to using [facility] outpatient pharmacy for any new or changed prescriptions prior to discharge. YES          01/03/23 1044   Service Assessment   Patient Orientation Alert and Oriented   Cognition Alert   History Provided By Patient   Primary Caregiver Self   Support Systems Family Members;Case Manager/Social Air cabin crew is: Patient Declined Doctor, hospital Next of Kin Remains as Management consultant)   PCP Verified by CM No  (None. Lives in [place])   Prior Functional Level Independent in ADLs/IADLs   Current Functional Level Independent in ADLs/IADLs   Can patient return to prior living arrangement Yes   Ability to make needs known: Good   Family able to assist with home care needs: No   Would you like for me to discuss the discharge plan with any other family members/significant others, and if so, who? No   Psychologist, occupational None   CM/SW Referral No PCP   Social/Functional History   Lives With   (Staying with business partner in [place])   Type of Home House   Home Layout One level    Bathroom Shower/Tub Tub/Shower unit   Bathroom Accessibility Accessible   Home Equipment None   ADL Assistance Independent   Homemaking Assistance Independent   Homemaking Responsibilities No   Occupational hygienist Yes   Mode of Engineer, water   Occupation Full time employment   Discharge Planning   Type of Residence House   Living Arrangements Alone   Current Services Prior To Admission None   Potential Assistance Needed N/A   Potential Assistance Purchasing Medications No   Type of Home Care Services None   Patient expects to be discharged to: House   One/Two Story Residence One story   History of falls? 0   Services At/After Discharge   Transition of Care Consult (CM Consult) Discharge Planning   Services At/After Discharge None   Veteran Resource Information Provided? No   Mode of Transport at Discharge BLS  (Friend will transport home)   Confirm Follow Up Transport Family   Condition of Participation: Discharge Planning   The Patient and/or Patient Representative was provided with a Choice of Provider? Patient   The Patient and/Or Patient Representative agree with the Discharge Plan? Yes  Freedom of Choice list was provided with basic dialogue that supports the patient's individualized plan of care/goals, treatment preferences, and shares the quality data associated with the providers?  Yes

## 2023-01-03 NOTE — Progress Notes (Signed)
PAGER ID: [phone]  MESSAGE: [phone] - pt complaining of 10/10 pain, already gave Tylenol and Roxicodone. Can I get IV dilaudid for this patient or another alternative? [phone]

## 2023-01-03 NOTE — Progress Notes (Signed)
THORACIC SURGERY PROGRESS NOTE  Progress Note    Patient: Thomas Browning               Sex: male          DOA: 12/31/2022       Date of Birth:  [birth year]      Age:  57 y.o.        LOS:  LOS: 3 days     Chart reviewed.    Assessment and Plan:   1.  Right parapneumonic effusion.  Patient tolerating lytic therapy.  Will check chest x-ray today and continue lytic therapy twice daily per the [facility] II protocol    Problem List:  Principal Problem:    Pleural effusion on right  Active Problems:    SOB (shortness of breath)  Resolved Problems:    * No resolved hospital problems. *            Subjective:    Patient seen and examined on rounds today.  I have independently reviewed all pertinent labs and radiologic films.  Feels well.  No complaints      Objective:     Vital Signs:  BP 124/74   Pulse 70   Temp 97.1 F (36.2 C) (Temporal)   Resp 18   Ht 1.803 m (5\' 11" )   Wt (!) 154.3 kg (340 lb 2.7 oz)   SpO2 98%   BMI 47.44 kg/m   Temp (24hrs), Avg:97.6 F (36.4 C), Min:97.1 F (36.2 C), Max:98 F (36.7 C)    Admission Weight: Last Weight   Weight - Scale: (!) 149.7 kg (330 lb) Weight - Scale: (!) 154.3 kg (340 lb 2.7 oz)       Physical Examination:     General:  Alert, oriented   Lungs: Good inspiratory effort.  Clear.  Heart:  RRR   Abdomen: Benign  Extremities: Warm and well perfused.  Edema - none    Chest Tubes:      Intake and Output:  Last three shifts:  05/27 1901 - 05/29 0700  In: 100   Out: 3900 [Urine:1450]    Lab Results:  Lab Results   Component Value Date/Time    WBC 26.9 (H) 01/03/2023 04:42 AM    WBC 36.2 (H) 01/01/2023 12:30 AM    WBC 34.5 (H) 12/31/2022 02:33 PM    HCT 40.9 01/03/2023 04:42 AM    HCT 44.6 01/01/2023 12:30 AM    HCT 42.5 12/31/2022 02:33 PM    PLT 417 01/03/2023 04:42 AM      Lab Results   Component Value Date/Time    NA 135 (L) 01/03/2023 04:42 AM    K 3.7 01/03/2023 04:42 AM    CL 101 01/03/2023 04:42 AM    CO2 27 01/03/2023 04:42 AM    GLUCOSE 137 (H) 01/03/2023 04:42 AM    BUN 32  (H) 01/03/2023 04:42 AM       Recent Days:  Recent Labs     12/31/22  1433 01/01/23  0030 01/03/23  0442   WBC 34.5* 36.2* 26.9*   HGB 14.2 14.4 13.7   HCT 42.5 44.6 40.9   PLT 419 444 417     Recent Labs     12/31/22  1433 12/31/22  1654 01/01/23  0030 01/03/23  0442   NA 134*  --  135* 135*   K 3.9  --  3.9 3.7   CL 99  --  100 101   CO2 25  --  22 27   GLUCOSE 113*  --  157* 137*   BUN 30*  --  27* 32*   CREATININE 1.34*  --  1.26 1.21   MG  --   --   --  1.9   ALT 27  --  27  --    INR  --  1.7* 1.6*  --      Recent Labs     01/01/23  0038   PH 7.362   PCO2 42.6   PO2 102.0*   HCO3 24.2       Results       Procedure Component Value Units Date/Time    Culture, Body Fluid [[id number]] Collected: 01/01/23 1440    Order Status: Completed Specimen: Body fld Updated: 01/02/23 1250     Gram Stain Result Moderate WBC'S  No Organisms Seen        Culture Result No Growth To Date       Culture, Respiratory [[id number]]  (Abnormal) Collected: 12/31/22 1907    Order Status: Completed Specimen: RESPIRATORY Updated: 01/02/23 1036     Gram Stain Result       <10 Epithelial cells/lpf  >25 WBC's/lpf  Rare Gram Positive Cocci  Rare Gram Variable Bacilli       Culture Result       Moderate Commensal Flora Isolated After 48 Hours          Culture, Urine [[id number]]  (Abnormal) Collected: 12/31/22 1730    Order Status: Completed Specimen: Urine Updated: 01/02/23 0831     Culture Result --        60,000 CFU/mL  Skin and/or genital contamination      Legionella antigen, urine [[id number]] Collected: 12/31/22 1656    Order Status: Completed Specimen: Urine Updated: 12/31/22 1746     Legionella Urinary Ag NEGATIVE        Comment: Presumptive negative for L. pneumophila serogroup 1 antigen in urine, suggesting no recent or  current infection. Infection due to Legionella cannot be ruled out since other serogroups and  species may cause disease, antigen may not be present in urine in early infection and the level  of antigen present in the  urine may be below the detection limit of the test.           Strep Pneumoniae Antigen [[id number]] Collected: 12/31/22 1656    Order Status: Completed Specimen: Urine Updated: 12/31/22 1746     STREP PNEUMONIAE ANTIGEN, URINE NEGATIVE        Comment: Presumptive negative for pneumococcal pneumonia, suggesting no current or recent pneumococcal  infection.  Infection due to S. pneumoniae cannot be ruled out since the antigen present in the  sample may be below the detection limit of the test.         Blood Culture 1 [[id number]] Collected: 12/31/22 1637    Order Status: Completed Specimen: Blood Updated: 01/03/23 0711     BLOOD CULTURE RESULT No Growth At 48 Hours       Blood Culture 2 [[id number]] Collected: 12/31/22 1637    Order Status: Completed Specimen: Blood Updated: 01/03/23 0711     BLOOD CULTURE RESULT No Growth At 48 Hours       Respiratory Pathogen Panel [[id number]] Collected: 12/31/22 1545    Order Status: Completed Specimen: Nasopharyngeal Swab Updated: 12/31/22 1940     Adenovirus NOT DETECTED        Coronavirus 299E NOT DETECTED        Coronavirus HKU1 NOT DETECTED  Coronavirus NL63 NOT DETECTED        Coronavirus OC43 NOT DETECTED        Human Metapneumovirus NOT DETECTED        INFLUENZA A H1 NOT DETECTED        Influenza H1N1 NOT DETECTED        Influenza A PCR NOT DETECTED        Influenza B PCR NOT DETECTED        Influenza A H3 NOT DETECTED        Parainfluenza 1 NOT DETECTED        Parainfluenza 2 NOT DETECTED        Parainfluenza 3 NOT DETECTED        Parainfluenza NOT DETECTED        Rhinovirus/Enterovirus NOT DETECTED        RSV PCR NOT DETECTED        Bordetella Pertussis NOT DETECTED        Chlamydophilia pneumoniae NOT DETECTED        Mycoplasma pneumoniae NOT DETECTED        SARS-CoV-2 NOT DETECTED        Comment: A Not Detected (negative) test result for this test means that SARS- CoV-2 RNA was not present  in the specimen above the limit of detection. However, a negative result  does not rule out the  possibility of COVID-19 and should not be used as the sole basis for treatment or patient  management decisions.   This test for SARS-CoV2 has been authorized by FDA under an Emergency Use Authorization (EUA).         Rapid Strep Screen [[id number]] Collected: 12/31/22 1545    Order Status: Completed Specimen: Throat Updated: 12/31/22 1747     Strep A Ag       Negative - No Streptococcus Group A DNA was Detected.           Comment: NEGATIVE Streptococcus group A PCR do NOT REFLEX to culture since PCR is very sensitive (equal  to culture for Strep. group A), however, if pharyngitis is suspected by another beta hemolytic  Streptococcus group then you must order a throat culture. Call microbiology ext [phone] if culture  is needed to be added on for testing.         MRSA by PCR [[id number]] Collected: 12/31/22 1545    Order Status: Completed Specimen: Nares Updated: 12/31/22 1852     MRSA PCR screen NEGATIVE                RADIOLOGY:    CT CHEST W CONTRAST  Narrative: EXAM: CT CHEST W CONTRAST    INDICATION: Evaluate residual right pleural effusion for possible lytic  therapy..    TECHNIQUE: CT scan of the chest with IV contrast including coronal and sagittal  images.    All CT exams at this facility use one or more dose reduction techniques  including automatic exposure control, mA/kV adjustment per patient's size, or  iterative reconstruction technique.     COMPARISON:   01/01/2023    FINDINGS:  Pulmonary arteries: Unremarkable. No pulmonary embolism.  Aorta: No acute findings. No thoracic aortic aneurysm or dissection.  Lungs: Redemonstrated moderate-sized loculated right pleural effusion with  associated atelectasis. Indwelling right chest tube.  Lymph nodes: Unremarkable. No enlarged lymph nodes.  Esophagus: Normal  Pleural spaces: Unremarkable. No significant effusion. No pneumothorax.  Heart: Unremarkable. No cardiomegaly. No significant pericardial effusion. No  evidence of right ventricular  dysfunction.  Thyroid: Normal  Chest wall: No abnormal findings.  Bones: No fractures identified.    Upper abdomen: Normal  Impression: IMPRESSION:    1. Moderate-sized loculated right pleural effusion with atelectasis.  2. Right chest tube.     Electronically signed by: Cheryln Manly, MD 01/02/2023 12:09 PM EDT            Workstation ID: [id number]  XR CHEST PORTABLE  Narrative: Indication: Right chest tube  Frontal view chest     Compared to 01/01/2023 the heart is enlarged with ectatic tortuous aorta.  Moderate right pleural effusion decreased from prior study slightly with chest  tube at right base. No pneumothorax. Chest tube slightly retracted.  Impression: IMPRESSION: Mild decrease in right pleural effusion with slight retraction of  right chest tube from prior study. No other significant change.    Electronically signed by: Darlina Rumpf, MD 01/02/2023 7:50 AM EDT            Workstation ID: [id number]      Home Medications:  None        Current Medications:  Current Facility-Administered Medications   Medication Dose Route Frequency    heparin (porcine) injection 5,000 Units  5,000 Units SubCUTAneous BID    sodium chloride flush 0.9 % injection 10 mL  10 mL IntraVENous PRN    ALTEplase (CATHFLO) 10 mg in sodium chloride 0.9 % 30 mL  10 mg IntraPLEUral Q12H    And    dornase alpha (PULMOZYME) 5 mg in sterile water 30 mL  5 mg IntraPLEUral Q12H    oxyCODONE (ROXICODONE) immediate release tablet 5 mg  5 mg Oral Q4H PRN    bisacodyl (DULCOLAX) suppository 10 mg  10 mg Rectal Daily PRN    Magnesium Citrate solution 296 mL  296 mL Oral Once PRN    losartan (COZAAR) tablet 25 mg  25 mg Oral Daily    docusate sodium (COLACE) capsule 100 mg  100 mg Oral BID    piperacillin-tazobactam (ZOSYN) 4,500 mg in sodium chloride 0.9 % 100 mL IVPB (mini-bag)  4,500 mg IntraVENous q8h    benzonatate (TESSALON) capsule 100 mg  100 mg Oral TID PRN    albuterol (ACCUNEB) nebulizer solution 1.25 mg  1.25 mg Nebulization Q6H PRN     nystatin (MYCOSTATIN) powder   Topical BID    senna (SENOKOT) tablet 8.6 mg  1 tablet Oral Nightly    polyethylene glycol (GLYCOLAX) packet 17 g  17 g Oral Daily    furosemide (LASIX) injection 40 mg  40 mg IntraVENous BID    potassium chloride (KLOR-CON M) extended release tablet 20 mEq  20 mEq Oral Daily    carvedilol (COREG) tablet 6.25 mg  6.25 mg Oral BID WC    sodium chloride flush 0.9 % injection 5-40 mL  5-40 mL IntraVENous 2 times per day    sodium chloride flush 0.9 % injection 5-40 mL  5-40 mL IntraVENous PRN    0.9 % sodium chloride infusion   IntraVENous PRN    acetaminophen (TYLENOL) tablet 650 mg  650 mg Oral Q6H PRN    Or    acetaminophen (TYLENOL) suppository 650 mg  650 mg Rectal Q6H PRN    lidocaine 4 % external patch 1 patch  1 patch TransDERmal Daily           This note was generated with the aid of a dictation system.  Unintentional errors may be present.  Cheyenne Adas, MD  Jan 03, 2023  7:20 AM

## 2023-01-03 NOTE — Consults (Signed)
[facility]    Patient: Thomas Browning Age: 57 y.o. Sex: male    Date of Birth: 04-Jul-1966 Admit Date: 12/31/2022 PCP: Unknown, Provider, APRN - NP   MRN: [id number]  CSN: [id number]       Seen in follow up for CHF    CARDIOLOGY PROGRESS NOTE    RECS:  Cardiomyopathy // HFrEF // decreased RV function, EF 39%  Continue carvedilol and losartan.  May consider transitioning losartan to [facility].  Continue IV diuresis  Advance GDMT as allowable; eventual addition of SGLT2 inhibitor and MRA    Pleural effusion  Management per Dr. Effie Shy    Respiratory failure:  Remains on nasal cannula      Lauree Chandler, DO, [facility]  [facility]  [facility]  Pager: [phone]  Office: [phone]      ASSESSMENT:  Cardiomyopathy  Echo 5/58/24 LVEF 39%, grade II diastolic dysfunction, moderate to severe RV dilation with visually reduced systolic function , est RVSP 47 mmHg  Right pleural effusion // empyema  Status post right pigtail catheter placement 01/01/2023  Initial treatment with thrombolytic therapy  Leukocytosis  Up to 36 K with neutrophilia  Blood culture, pleural fluid culture negative  Urine culture consistent with contamination.  Respiratory culture with moderate commensal flora    Hospital Problems             Last Modified POA    * (Principal) Pleural effusion on right 12/31/2022 Yes    Overview Signed 12/31/2022  4:00 PM by Drinda Butts, MD     12/31/22 ER The patient presents for intermittent SOB for several weeks that increased today. The patient denies known CHF history, but reports bilateral leg swelling.  BP 153/88, HR 107, Temp 97.9,  K 3.9, BUN 30, Creat 1.34, BNP 1729, hsTrop I 27, WBC 34.5 with 81% poly and 9% Bands.   CXR  moderate to large right loculated pleural effusion with atelectasis in the underlying lung..  The left lung is clear and the heart is of normal size.         SOB (shortness of breath) 12/31/2022 Yes    Overview Signed  12/31/2022  8:55 PM by Drinda Butts, MD     06/10/20 ECHO EF 45-50%, LV 34/46, S/PW 11/10, LAVI 21, Tr MR/TR, AVG 5mm, Gr I DD.               INTERVAL HX:  No acute events overnight  Renal function stable  Na 135, BUN 32, Ca2+ 8.4  WBC 26.9    SUBJECTIVE:  Pleuritic chest pain at chest tube site.  Some sleepiness and confusion.    VS: BP 106/74   Pulse 69   Temp 97.1 F (36.2 C) (Temporal)   Resp (!) 6   Ht 1.803 m (5\' 11" )   Wt (!) 154.3 kg (340 lb 2.7 oz)   SpO2 97%   BMI 47.44 kg/m     Intake/Output Summary (Last 24 hours) at 01/03/2023 1600  Last data filed at 01/03/2023 1112  Gross per 24 hour   Intake 100 ml   Output 3900 ml   Net -3800 ml     TELE: SR    General: NAD sinus rhythm  Neck: + JVD, supple  Resp: CTA bilaterally; No wheezes or rales  CV: RRR, S1 S2, no murmurs, rubs or clicks  Abd: Soft, non-tender, ND  Ext: 1+  ,.edema  Neuro: Alert and oriented, moves all four extremities  Skin: Warm, dry, well perfused    Labs:    Lab Results   Component Value Date/Time    TROPHS 27 12/31/2022 02:33 PM       GFR: Estimated Creatinine Clearance: 102 mL/min (based on SCr of 1.21 mg/dL).     Lab Results   Component Value Date/Time    WBC 26.9 (H) 01/03/2023 04:42 AM    RBC 4.67 01/03/2023 04:42 AM    HGB 13.7 01/03/2023 04:42 AM    HCT 40.9 01/03/2023 04:42 AM    MCV 87.6 01/03/2023 04:42 AM    MCH 29.3 01/03/2023 04:42 AM    MCHC 33.5 01/03/2023 04:42 AM    RDW 46.2 (H) 01/03/2023 04:42 AM    PLT 417 01/03/2023 04:42 AM    MPV 9.1 01/03/2023 04:42 AM       Lab Results   Component Value Date/Time    MONOS 5.0 01/03/2023 04:42 AM       Lab Results   Component Value Date    NA 135 (L) 01/03/2023    K 3.7 01/03/2023    CL 101 01/03/2023    CO2 27 01/03/2023    BUN 32 (H) 01/03/2023    MG 1.9 01/03/2023      No results found for: "CPK", "CKMB", "BNP"     No results found for: "TSH", "TSH2", "TSH3", "TSHELE", "T3RIA", "T3UP", "FT3", "FT4", "T4", "T4P", "TT7"     Lab Results   Component Value Date    INR 1.6 (H)  01/01/2023    APTT 34.6 12/31/2022       No results found for: "HBA1C", "HBA1CPOC"      Lab Results   Component Value Date/Time    CHOL 114 12/31/2022 02:33 PM    HDL <20 12/31/2022 02:33 PM    LDL 70 12/31/2022 02:33 PM        Lab Results   Component Value Date    ALT 27 01/01/2023       Medications:    Current Facility-Administered Medications   Medication Dose Route Frequency Provider Last Rate Last Admin    morphine (PF) injection 2 mg  2 mg IntraVENous Q4H PRN Theodis Aguas, MD   2 mg at 01/03/23 1519    heparin (porcine) injection 5,000 Units  5,000 Units SubCUTAneous BID Verlan Friends, MD   5,000 Units at 01/03/23 0941    sodium chloride flush 0.9 % injection 10 mL  10 mL IntraVENous PRN Berton [place], MD   10 mL at 01/02/23 0930    ALTEplase (CATHFLO) 10 mg in sodium chloride 0.9 % 30 mL  10 mg IntraPLEUral Q12H Swiderski, Iris J, PA-C   10 mg at 01/03/23 1004    And    dornase alpha (PULMOZYME) 5 mg in sterile water 30 mL  5 mg IntraPLEUral Q12H Swiderski, Iris J, PA-C   5 mg at 01/03/23 1004    oxyCODONE (ROXICODONE) immediate release tablet 5 mg  5 mg Oral Q4H PRN Swiderski, Iris J, PA-C   5 mg at 01/03/23 1245    bisacodyl (DULCOLAX) suppository 10 mg  10 mg Rectal Daily PRN Theodis Aguas, MD        Magnesium Citrate solution 296 mL  296 mL Oral Once PRN Theodis Aguas, MD        losartan (COZAAR) tablet 25 mg  25 mg Oral Daily Burgess Estelle, MD   25 mg at 01/03/23 0940    docusate sodium (COLACE) capsule 100 mg  100 mg Oral BID Odelia Gage III, MD   100 mg at 01/03/23 0940    piperacillin-tazobactam (ZOSYN) 4,500 mg in sodium chloride 0.9 % 100 mL IVPB (mini-bag)  4,500 mg IntraVENous q8h Richarda Overlie, DO   Stopped at 01/03/23 1436    benzonatate (TESSALON) capsule 100 mg  100 mg Oral TID PRN Richarda Overlie, DO   100 mg at 01/01/23 2246    albuterol (ACCUNEB) nebulizer solution 1.25 mg  1.25 mg Nebulization Q6H PRN Richarda Overlie, DO        nystatin (MYCOSTATIN) powder    Topical BID Richarda Overlie, DO   Given at 01/03/23 1610    senna (SENOKOT) tablet 8.6 mg  1 tablet Oral Nightly Hutchinson, Anne, DO   8.6 mg at 01/01/23 2246    polyethylene glycol (GLYCOLAX) packet 17 g  17 g Oral Daily Richarda Overlie, DO   17 g at 01/01/23 9604    furosemide (LASIX) injection 40 mg  40 mg IntraVENous BID Richarda Overlie, DO   40 mg at 01/03/23 0943    potassium chloride (KLOR-CON M) extended release tablet 20 mEq  20 mEq Oral Daily Richarda Overlie, DO   20 mEq at 01/03/23 0940    carvedilol (COREG) tablet 6.25 mg  6.25 mg Oral BID WC Hutchinson, Anne, DO   6.25 mg at 01/03/23 0941    sodium chloride flush 0.9 % injection 5-40 mL  5-40 mL IntraVENous 2 times per day Richarda Overlie, DO   10 mL at 01/03/23 0947    sodium chloride flush 0.9 % injection 5-40 mL  5-40 mL IntraVENous PRN Hutchinson, Anne, DO        0.9 % sodium chloride infusion   IntraVENous PRN Richarda Overlie, DO        acetaminophen (TYLENOL) tablet 650 mg  650 mg Oral Q6H PRN Richarda Overlie, DO   650 mg at 01/03/23 1245    Or    acetaminophen (TYLENOL) suppository 650 mg  650 mg Rectal Q6H PRN Hutchinson, Anne, DO        lidocaine 4 % external patch 1 patch  1 patch TransDERmal Daily Hutchinson, Anne, DO   1 patch at 01/02/23 0902         I have personally and independently reviewed all imaging, diagnostic medical testing, and laboratory data to date.  I have integrated these findings into my Assessment and Plan outlined above.    York Pellant, FNP-C  [facility]  May 29, 20244:00 PM

## 2023-01-03 NOTE — Procedures (Signed)
PROCEDURE NOTE  Date: 01/03/2023   Name: Thomas Browning  Date of Birth: 1966-06-06    Procedures    Procedure Note  Right Chemical Pleurodesis    Patient: Thomas Browning MRN: [id number]  SSN: [id number]    Date of Birth: 05/28/1966  Age: 57 y.o.  Sex: Male      Date of Procedure: 01/03/2023     Pre-procedure Diagnosis: Right Empyema    Post-procedure Diagnosis: Right Empyema    Procedure: Chemical pleurodesis.  Instillation of TPA/Pulmozyme via right chest tube.    Indications: Right empyema      Procedure details:  The procedure and potential risks including bleeding with the possibility of transfusion requirement were explained to the patient.  Timeout was performed.  Under sterile conditions the connection between the chest tube and the Pleur-evac was disconnected.  5 mg of Pulmozyme which had been appropriately diluted by the pharmacist was injected into the chest tube.  Then, 10 mg of TPA which had been appropriately diluted by the pharmacist was injected into the chest tube.  Then, 10 cc of sterile saline was injected into the chest tube.  The chest tube was reconnected to the Pleur-evac.  The Pleur-evac tubing was clamped.  Orders were written. Patient tolerated the procedure well.    Performed By: Felix Pacini, PA-C     Anesthesia: None    Estimated Blood Loss: None    Specimens: None    Findings: Fluids injected easily.    Complications: None    Implants: None    Recommendations: Clamp chest tube for 4 hours.  Then return to suction.    Signed By: Felix Pacini, PA-C     Jan 03, 2023

## 2023-01-03 NOTE — Progress Notes (Signed)
Patient seen with Dr. Effie Shy, since un-clamping of right CT at 1415, output greater than .  Decision to forego further doses of thrombolyic therapy per Dr. Effie Shy.  Mary in Pharmacy notified by Dr. Effie Shy.  Will get CXR in AM.    Paulina Fusi, DHSc, PA-C  Cardiac Surgery  01/03/23 1725

## 2023-01-03 NOTE — Progress Notes (Signed)
[facility]     Name: Thomas Browning MRN: [id number]   DOB: 1966-05-23 Hospital: [facility]   Date: 01/03/2023        IMPRESSION:   Large loculated right pleural effusion noticed on chest x-ray obtained on 12/31/2022, and CT of the chest obtained on 01/01/2023..  Empyema versus parapneumonic effusion.  Elevated procalcitonin level.  Negative acute respiratory viral pathogen panel and urine antigen.  S/p CT-guided right pigtail catheter placement on 01/01/2023.   Exudative pleural effusion by protein criteria.  S/p DNase/tPA through right chest tube every 12 hours for total six doses started on  01/02/2023, resulting interval improvement in chest tube output  Acute on chronic systolic congestive heart failure.  Last echo obtained in October 2021 demonstrated LVEF around 45 to 50%.  Sepsis likely from pulmonary origin.  Hypertensive history present on hospitalization.  Prolonged QTc.  Suspected chronic kidney disease with baseline creatinine around 1.2-1.4.  NIDDM type II, last HbA1c 6.3 in 2021.  Morbid obesity.  Medical noncompliance      PLAN:   Currently on supplemental oxygen through nasal cannula.  Titrate, and target oxygen saturation greater than 88%.  Continue vancomycin,  and Zosyn.  Follow ID recommendations  Follow pleural fluid analysis, culture, and cytology  Chest tube management as per thoracic surgery.  Continue Lasix 40 mg IV twice a day.  Follow ins and out.  Follow renal function.  Avoid nephrotoxic agent.  Continue carvedilol 6.25 mg twice a day.  May need dose adjustment.  DVT prophylaxis: Continue subcu heparin     Subjective/Interval History:         Review of Systems   Respiratory:  Positive for shortness of breath.    Cardiovascular:  Positive for chest pain.   All other systems reviewed and are negative.        Objective:   Vital Signs:    BP 124/74   Pulse 70   Temp 97.1 F (36.2 C) (Temporal)   Resp 18   Ht 1.803 m (5\' 11" )    Wt (!) 154.3 kg (340 lb 2.7 oz)   SpO2 98%   BMI 47.44 kg/m             Temp (24hrs), Avg:97.5 F (36.4 C), Min:97.1 F (36.2 C), Max:98 F (36.7 C)       Intake/Output:   Last shift:      No intake/output data recorded.  Last 3 shifts: 05/27 1901 - 05/29 0700  In: 100   Out: 3900 [Urine:1450]    Intake/Output Summary (Last 24 hours) at 01/03/2023 0928  Last data filed at 01/03/2023 0223  Gross per 24 hour   Intake 100 ml   Output 3350 ml   Net -3250 ml          Physical Exam  Vitals and nursing note reviewed.   Eyes:      Pupils: Pupils are equal, round, and reactive to light.   Cardiovascular:      Rate and Rhythm: Regular rhythm.   Pulmonary:      Comments: Reduced air entry on right side  + Right chest tube   Skin:     General: Skin is warm and dry.   Neurological:      General: No focal deficit present.      Mental Status: He is alert and oriented to person, place, and time.             DATA:  Labs:  Recent Labs     12/31/22  1433 01/01/23  0030 01/03/23  0442   WBC 34.5* 36.2* 26.9*   HGB 14.2 14.4 13.7   HCT 42.5 44.6 40.9   PLT 419 444 417       Recent Labs     12/31/22  1433 12/31/22  1654 01/01/23  0030 01/03/23  0442   NA 134*  --  135* 135*   K 3.9  --  3.9 3.7   CL 99  --  100 101   CO2 25  --  22 27   BUN 30*  --  27* 32*   MG  --   --   --  1.9   ALT 27  --  27  --    INR  --  1.7* 1.6*  --        Recent Labs     01/01/23  0038   PH 7.362   PCO2 42.6   PO2 102.0*   HCO3 24.2            Results       Procedure Component Value Units Date/Time    Culture, Body Fluid [[id number]] Collected: 01/01/23 1440    Order Status: Completed Specimen: Body fld Updated: 01/02/23 1250     Gram Stain Result Moderate WBC'S  No Organisms Seen        Culture Result No Growth To Date       Culture, Respiratory [[id number]]  (Abnormal) Collected: 12/31/22 1907    Order Status: Completed Specimen: RESPIRATORY Updated: 01/02/23 1036     Gram Stain Result       <10 Epithelial cells/lpf  >25 WBC's/lpf  Rare Gram Positive  Cocci  Rare Gram Variable Bacilli       Culture Result       Moderate Commensal Flora Isolated After 48 Hours          Culture, Urine [[id number]]  (Abnormal) Collected: 12/31/22 1730    Order Status: Completed Specimen: Urine Updated: 01/02/23 0831     Culture Result --        60,000 CFU/mL  Skin and/or genital contamination      Legionella antigen, urine [[id number]] Collected: 12/31/22 1656    Order Status: Completed Specimen: Urine Updated: 12/31/22 1746     Legionella Urinary Ag NEGATIVE        Comment: Presumptive negative for L. pneumophila serogroup 1 antigen in urine, suggesting no recent or  current infection. Infection due to Legionella cannot be ruled out since other serogroups and  species may cause disease, antigen may not be present in urine in early infection and the level  of antigen present in the urine may be below the detection limit of the test.           Strep Pneumoniae Antigen [[id number]] Collected: 12/31/22 1656    Order Status: Completed Specimen: Urine Updated: 12/31/22 1746     STREP PNEUMONIAE ANTIGEN, URINE NEGATIVE        Comment: Presumptive negative for pneumococcal pneumonia, suggesting no current or recent pneumococcal  infection.  Infection due to S. pneumoniae cannot be ruled out since the antigen present in the  sample may be below the detection limit of the test.         Blood Culture 1 [[id number]] Collected: 12/31/22 1637    Order Status: Completed Specimen: Blood Updated: 01/03/23 0711     BLOOD CULTURE RESULT No Growth At 48 Hours  Blood Culture 2 [[id number]] Collected: 12/31/22 1637    Order Status: Completed Specimen: Blood Updated: 01/03/23 0711     BLOOD CULTURE RESULT No Growth At 48 Hours       Respiratory Pathogen Panel [[id number]] Collected: 12/31/22 1545    Order Status: Completed Specimen: Nasopharyngeal Swab Updated: 12/31/22 1940     Adenovirus NOT DETECTED        Coronavirus 299E NOT DETECTED        Coronavirus HKU1 NOT DETECTED        Coronavirus NL63  NOT DETECTED        Coronavirus OC43 NOT DETECTED        Human Metapneumovirus NOT DETECTED        INFLUENZA A H1 NOT DETECTED        Influenza H1N1 NOT DETECTED        Influenza A PCR NOT DETECTED        Influenza B PCR NOT DETECTED        Influenza A H3 NOT DETECTED        Parainfluenza 1 NOT DETECTED        Parainfluenza 2 NOT DETECTED        Parainfluenza 3 NOT DETECTED        Parainfluenza NOT DETECTED        Rhinovirus/Enterovirus NOT DETECTED        RSV PCR NOT DETECTED        Bordetella Pertussis NOT DETECTED        Chlamydophilia pneumoniae NOT DETECTED        Mycoplasma pneumoniae NOT DETECTED        SARS-CoV-2 NOT DETECTED        Comment: A Not Detected (negative) test result for this test means that SARS- CoV-2 RNA was not present  in the specimen above the limit of detection. However, a negative result does not rule out the  possibility of COVID-19 and should not be used as the sole basis for treatment or patient  management decisions.   This test for SARS-CoV2 has been authorized by FDA under an Emergency Use Authorization (EUA).         Rapid Strep Screen [[id number]] Collected: 12/31/22 1545    Order Status: Completed Specimen: Throat Updated: 12/31/22 1747     Strep A Ag       Negative - No Streptococcus Group A DNA was Detected.           Comment: NEGATIVE Streptococcus group A PCR do NOT REFLEX to culture since PCR is very sensitive (equal  to culture for Strep. group A), however, if pharyngitis is suspected by another beta hemolytic  Streptococcus group then you must order a throat culture. Call microbiology ext [phone] if culture  is needed to be added on for testing.         MRSA by PCR [[id number]] Collected: 12/31/22 1545    Order Status: Completed Specimen: Nares Updated: 12/31/22 1852     MRSA PCR screen NEGATIVE                 Imaging:   CT Result (most recent):  CT CHEST W CONTRAST 01/02/2023    Narrative  EXAM: CT CHEST W CONTRAST    INDICATION: Evaluate residual right pleural effusion for  possible lytic  therapy..    TECHNIQUE: CT scan of the chest with IV contrast including coronal and sagittal  images.    All CT exams at this facility use one or more dose  reduction techniques  including automatic exposure control, mA/kV adjustment per patient's size, or  iterative reconstruction technique.    COMPARISON:  01/01/2023    FINDINGS:  Pulmonary arteries: Unremarkable. No pulmonary embolism.  Aorta: No acute findings. No thoracic aortic aneurysm or dissection.  Lungs: Redemonstrated moderate-sized loculated right pleural effusion with  associated atelectasis. Indwelling right chest tube.  Lymph nodes: Unremarkable. No enlarged lymph nodes.  Esophagus: Normal  Pleural spaces: Unremarkable. No significant effusion. No pneumothorax.  Heart: Unremarkable. No cardiomegaly. No significant pericardial effusion. No  evidence of right ventricular dysfunction.  Thyroid: Normal  Chest wall: No abnormal findings.  Bones: No fractures identified.    Upper abdomen: Normal    Impression  IMPRESSION:    1. Moderate-sized loculated right pleural effusion with atelectasis.  2. Right chest tube.    Electronically signed by: Cheryln Manly, MD 01/02/2023 12:09 PM EDT  Workstation ID: [id number]            Verlan Friends, MD

## 2023-01-04 ENCOUNTER — Inpatient Hospital Stay: Admit: 2023-01-04 | Primary: Diagnostic Radiology

## 2023-01-04 DIAGNOSIS — J90 Pleural effusion, not elsewhere classified: Secondary | ICD-10-CM | POA: Diagnosis not present

## 2023-01-04 DIAGNOSIS — J302 Other seasonal allergic rhinitis: Secondary | ICD-10-CM | POA: Diagnosis not present

## 2023-01-04 DIAGNOSIS — I10 Essential (primary) hypertension: Secondary | ICD-10-CM | POA: Diagnosis not present

## 2023-01-04 LAB — CBC WITH AUTO DIFFERENTIAL
Anisocytosis: 1
Atypical Lymphocytes: 1 % — ABNORMAL HIGH (ref 0–0)
Band Neutrophils: 1 % (ref 0–11)
Basophils: 0.1 % (ref 0–3)
Eosinophils: 2 % (ref 0–5)
Giant PLTs: 2
Hematocrit: 44.1 % (ref 36.0–55.0)
Hemoglobin: 14 gm/dl (ref 13.0–18.0)
Immature Granulocytes %: 20.2 % (ref 0.0–3.0)
Lymphocytes: 13 % — ABNORMAL LOW (ref 28–48)
MCH: 28.4 pg (ref 23.0–34.6)
MCHC: 31.7 gm/dl (ref 30.0–36.0)
MCV: 89.5 fL (ref 80.0–98.0)
MPV: 9.5 fL (ref 6.0–10.0)
Macrocytes: 1
Metamyelocytes: 4 % — ABNORMAL HIGH (ref 0–0)
Monocytes: 4 % (ref 1–13)
Myelocytes: 4 % — ABNORMAL HIGH (ref 0–0)
Neutrophils Segmented: 71 % — ABNORMAL HIGH (ref 34–64)
Nucleated RBCs: 0 (ref 0–0)
Platelet Appearance: NORMAL
Platelets: 457 10*3/uL — ABNORMAL HIGH (ref 140–450)
Polychromasia: 1
RBC: 4.93 M/uL (ref 3.80–5.70)
RDW: 47.8 — ABNORMAL HIGH (ref 35.1–43.9)
Smudge Cells: 39
WBC: 27.2 10*3/uL — ABNORMAL HIGH (ref 4.0–11.0)

## 2023-01-04 LAB — BASIC METABOLIC PANEL
Anion Gap: 7 mmol/L (ref 5–15)
BUN: 25 mg/dl — ABNORMAL HIGH (ref 9–23)
CO2: 30 mEq/L (ref 20–31)
Calcium: 8.2 mg/dl — ABNORMAL LOW (ref 8.7–10.4)
Chloride: 99 mEq/L (ref 98–107)
Creatinine: 1.26 mg/dl (ref 0.70–1.30)
GFR African American: >60
GFR Non-African American: >60
Glucose: 87 mg/dl (ref 74–106)
Potassium: 4 mEq/L (ref 3.5–5.1)
Sodium: 135 mEq/L — ABNORMAL LOW (ref 136–145)

## 2023-01-04 LAB — CULTURE, RESPIRATORY: Gram Stain Result: <10 — AB

## 2023-01-04 LAB — MAGNESIUM: Magnesium: 1.9 mg/dL (ref 1.6–2.6)

## 2023-01-04 MED FILL — CATHFLO ACTIVASE 2 MG IJ SOLR: 2 MG | INTRAMUSCULAR | Qty: 10

## 2023-01-04 MED FILL — FUROSEMIDE 10 MG/ML IJ SOLN: 10 MG/ML | INTRAMUSCULAR | Qty: 4

## 2023-01-04 MED FILL — LOSARTAN POTASSIUM 25 MG PO TABS: 25 MG | ORAL | Qty: 1

## 2023-01-04 MED FILL — DOCUSATE SODIUM 100 MG PO CAPS: 100 MG | ORAL | Qty: 1

## 2023-01-04 MED FILL — PULMOZYME 2.5 MG/2.5ML IN SOLN: 2.5 MG/ML | RESPIRATORY_TRACT | Qty: 5

## 2023-01-04 MED FILL — HEPARIN SODIUM (PORCINE) 5000 UNIT/ML IJ SOLN: 5000 UNIT/ML | INTRAMUSCULAR | Qty: 1

## 2023-01-04 MED FILL — CARVEDILOL 6.25 MG PO TABS: 6.25 MG | ORAL | Qty: 1

## 2023-01-04 MED FILL — KLOR-CON M20 20 MEQ PO TBCR: 20 MEQ | ORAL | Qty: 1

## 2023-01-04 MED FILL — CEFTRIAXONE SODIUM 2 G IJ SOLR: 2 g | INTRAMUSCULAR | Qty: 2000

## 2023-01-04 MED FILL — GNP LIDOCAINE PAIN RELIEF 4 % EX PTCH: 4 % | CUTANEOUS | Qty: 1

## 2023-01-04 MED FILL — ACETAMINOPHEN 325 MG PO TABS: 325 MG | ORAL | Qty: 2

## 2023-01-04 MED FILL — POLYETHYLENE GLYCOL 3350 17 G PO PACK: 17 g | ORAL | Qty: 1

## 2023-01-04 NOTE — Progress Notes (Signed)
[facility]     Name: Thomas Browning MRN: [id number]   DOB: 11/25/1965 Hospital: [facility]   Date: 01/04/2023        IMPRESSION:   Large loculated right pleural effusion noticed on chest x-ray obtained on 12/31/2022, and CT of the chest obtained on 01/01/2023..  Empyema versus parapneumonic effusion.  Elevated procalcitonin level.  Negative acute respiratory viral pathogen panel and urine antigen.  S/p CT-guided right pigtail catheter placement on 01/01/2023.   Exudative pleural effusion by protein criteria.  S/p DNase/tPA through right chest tube every 12 hours for total six doses started on  01/02/2023, resulting interval improvement in chest tube output, repeat chest x-ray obtained on 01/04/2023 demonstrated improving right lung opacity.  Chest tube removed on 01/04/2023  Acute on chronic systolic congestive heart failure.  Last echo obtained in October 2021 demonstrated LVEF around 45 to 50%.  Sepsis likely from pulmonary origin.  Hypertensive history present on hospitalization.  Prolonged QTc.  Suspected chronic kidney disease with baseline creatinine around 1.2-1.4.  NIDDM type II, last HbA1c 6.3 in 2021.  Morbid obesity.  Medical noncompliance      PLAN:   Currently on supplemental oxygen through nasal cannula.  Titrate, and target oxygen saturation greater than 88%.  Continue ceftriaxone.  Follow ID recommendations  Follow pleural fluid analysis, culture, and cytology  Continue Lasix 40 mg IV twice a day.  Follow ins and out.  Follow renal function.  Avoid nephrotoxic agent.  Continue carvedilol 6.25 mg twice a day.  May need dose adjustment.  DVT prophylaxis: Continue subcu heparin     Subjective/Interval History:         Review of Systems   All other systems reviewed and are negative.        Objective:   Vital Signs:    BP (!) 145/80   Pulse 71   Temp 98 F (36.7 C) (Temporal)   Resp 20   Ht 1.803 m (5\' 11" )   Wt (!) 154.3 kg (340 lb 2.7 oz)    SpO2 (!) 88%   BMI 47.44 kg/m             Temp (24hrs), Avg:97.8 F (36.6 C), Min:97 F (36.1 C), Max:99.2 F (37.3 C)       Intake/Output:   Last shift:      No intake/output data recorded.  Last 3 shifts: 05/28 1901 - 05/30 0700  In: 640.8 [P.O.:240]  Out: 4980 [Urine:1600]    Intake/Output Summary (Last 24 hours) at 01/04/2023 1011  Last data filed at 01/04/2023 0402  Gross per 24 hour   Intake 540.83 ml   Output 1700 ml   Net -1159.17 ml          Physical Exam  Vitals and nursing note reviewed.   Eyes:      Pupils: Pupils are equal, round, and reactive to light.   Cardiovascular:      Rate and Rhythm: Regular rhythm.   Pulmonary:      Comments: Reduced air entry on right side  + Right chest tube   Skin:     General: Skin is warm and dry.   Neurological:      General: No focal deficit present.      Mental Status: He is alert and oriented to person, place, and time.             DATA:  Labs:  Recent Labs     01/03/23  5409 01/04/23  0411   WBC 26.9* 27.2*   HGB 13.7 14.0   HCT 40.9 44.1   PLT 417 457*       Recent Labs     01/03/23  0442 01/04/23  0411   NA 135* 135*   K 3.7 4.0   CL 101 99   CO2 27 30   BUN 32* 25*   MG 1.9 1.9       No results for input(s): "PH", "PCO2", "PO2", "HCO3", "FIO2" in the last 72 hours.         Results       Procedure Component Value Units Date/Time    Culture, Body Fluid [[id number]] Collected: 01/01/23 1440    Order Status: Completed Specimen: Body fld Updated: 01/03/23 1238     Gram Stain Result Moderate WBC'S  No Organisms Seen        Culture Result No Growth at 2 days       Culture, Respiratory [[id number]]  (Abnormal) Collected: 12/31/22 1907    Order Status: Completed Specimen: RESPIRATORY Updated: 01/04/23 0901     Gram Stain Result       <10 Epithelial cells/lpf  >25 WBC's/lpf  Rare Gram Positive Cocci  Rare Gram Variable Bacilli       Culture Result       Moderate Commensal Flora Isolated           ISOLATE --        Moderate  Haemophilus influenzae  Beta-Lactamase  Positive      Culture, Urine [[id number]]  (Abnormal) Collected: 12/31/22 1730    Order Status: Completed Specimen: Urine Updated: 01/02/23 0831     Culture Result --        60,000 CFU/mL  Skin and/or genital contamination      Legionella antigen, urine [[id number]] Collected: 12/31/22 1656    Order Status: Completed Specimen: Urine Updated: 12/31/22 1746     Legionella Urinary Ag NEGATIVE        Comment: Presumptive negative for L. pneumophila serogroup 1 antigen in urine, suggesting no recent or  current infection. Infection due to Legionella cannot be ruled out since other serogroups and  species may cause disease, antigen may not be present in urine in early infection and the level  of antigen present in the urine may be below the detection limit of the test.           Strep Pneumoniae Antigen [[id number]] Collected: 12/31/22 1656    Order Status: Completed Specimen: Urine Updated: 12/31/22 1746     STREP PNEUMONIAE ANTIGEN, URINE NEGATIVE        Comment: Presumptive negative for pneumococcal pneumonia, suggesting no current or recent pneumococcal  infection.  Infection due to S. pneumoniae cannot be ruled out since the antigen present in the  sample may be below the detection limit of the test.         Blood Culture 1 [[id number]] Collected: 12/31/22 1637    Order Status: Completed Specimen: Blood Updated: 01/04/23 0718     BLOOD CULTURE RESULT No Growth At 72 Hours       Blood Culture 2 [[id number]] Collected: 12/31/22 1637    Order Status: Completed Specimen: Blood Updated: 01/04/23 0718     BLOOD CULTURE RESULT No Growth At 72 Hours       Respiratory Pathogen Panel [[id number]] Collected: 12/31/22 1545    Order Status: Completed Specimen: Nasopharyngeal Swab Updated: 12/31/22 1940     Adenovirus NOT DETECTED  Coronavirus 299E NOT DETECTED        Coronavirus HKU1 NOT DETECTED        Coronavirus NL63 NOT DETECTED        Coronavirus OC43 NOT DETECTED        Human Metapneumovirus NOT DETECTED         INFLUENZA A H1 NOT DETECTED        Influenza H1N1 NOT DETECTED        Influenza A PCR NOT DETECTED        Influenza B PCR NOT DETECTED        Influenza A H3 NOT DETECTED        Parainfluenza 1 NOT DETECTED        Parainfluenza 2 NOT DETECTED        Parainfluenza 3 NOT DETECTED        Parainfluenza NOT DETECTED        Rhinovirus/Enterovirus NOT DETECTED        RSV PCR NOT DETECTED        Bordetella Pertussis NOT DETECTED        Chlamydophilia pneumoniae NOT DETECTED        Mycoplasma pneumoniae NOT DETECTED        SARS-CoV-2 NOT DETECTED        Comment: A Not Detected (negative) test result for this test means that SARS- CoV-2 RNA was not present  in the specimen above the limit of detection. However, a negative result does not rule out the  possibility of COVID-19 and should not be used as the sole basis for treatment or patient  management decisions.   This test for SARS-CoV2 has been authorized by FDA under an Emergency Use Authorization (EUA).         Rapid Strep Screen [[id number]] Collected: 12/31/22 1545    Order Status: Completed Specimen: Throat Updated: 12/31/22 1747     Strep A Ag       Negative - No Streptococcus Group A DNA was Detected.           Comment: NEGATIVE Streptococcus group A PCR do NOT REFLEX to culture since PCR is very sensitive (equal  to culture for Strep. group A), however, if pharyngitis is suspected by another beta hemolytic  Streptococcus group then you must order a throat culture. Call microbiology ext [phone] if culture  is needed to be added on for testing.         MRSA by PCR [[id number]] Collected: 12/31/22 1545    Order Status: Completed Specimen: Nares Updated: 12/31/22 1852     MRSA PCR screen NEGATIVE                 Imaging:   CT Result (most recent):  CT CHEST W CONTRAST 01/02/2023    Narrative  EXAM: CT CHEST W CONTRAST    INDICATION: Evaluate residual right pleural effusion for possible lytic  therapy..    TECHNIQUE: CT scan of the chest with IV contrast including coronal  and sagittal  images.    All CT exams at this facility use one or more dose reduction techniques  including automatic exposure control, mA/kV adjustment per patient's size, or  iterative reconstruction technique.    COMPARISON:  01/01/2023    FINDINGS:  Pulmonary arteries: Unremarkable. No pulmonary embolism.  Aorta: No acute findings. No thoracic aortic aneurysm or dissection.  Lungs: Redemonstrated moderate-sized loculated right pleural effusion with  associated atelectasis. Indwelling right chest tube.  Lymph nodes: Unremarkable. No enlarged lymph nodes.  Esophagus: Normal  Pleural spaces: Unremarkable. No significant effusion. No pneumothorax.  Heart: Unremarkable. No cardiomegaly. No significant pericardial effusion. No  evidence of right ventricular dysfunction.  Thyroid: Normal  Chest wall: No abnormal findings.  Bones: No fractures identified.    Upper abdomen: Normal    Impression  IMPRESSION:    1. Moderate-sized loculated right pleural effusion with atelectasis.  2. Right chest tube.    Electronically signed by: Cheryln Manly, MD 01/02/2023 12:09 PM EDT  Workstation ID: [id number]            Verlan Friends, MD

## 2023-01-04 NOTE — Progress Notes (Signed)
[facility] Progress Note    Thomas Browning 57 y.o.  DOB: [birth year]  MRN: [id number]  SSN: [id number]      PCP: Unknown, Provider, APRN - NP  Cardiologist:    DOA: 12/31/2022     Impression:   Cardiomyopathy  Echo 5/58/24 LVEF 39%, grade II diastolic dysfunction, moderate to severe RV dilation, est RVSP 47 mmHg  Chronic heart failure with reduced systolic function  Right pleural effusion  Status post right pigtail catheter placement 01/01/2023  Chest tube removed 01/04/2023     Leukocytosis  Up to 36 K with neutrophilia  Blood culture, pleural fluid culture negative  Urine culture consistent with contamination.  Respiratory culture with moderate commensal flora    Hospital Problems             Last Modified POA    * (Principal) Pleural effusion on right 12/31/2022 Yes    Overview Signed 12/31/2022  4:00 PM by Drinda Butts, MD     12/31/22 ER The patient presents for intermittent SOB for several weeks that increased today. The patient denies known CHF history, but reports bilateral leg swelling.  BP 153/88, HR 107, Temp 97.9,  K 3.9, BUN 30, Creat 1.34, BNP 1729, hsTrop I 27, WBC 34.5 with 81% poly and 9% Bands.   CXR  moderate to large right loculated pleural effusion with atelectasis in the underlying lung..  The left lung is clear and the heart is of normal size.         SOB (shortness of breath) 12/31/2022 Yes    Overview Signed 12/31/2022  8:55 PM by Drinda Butts, MD     06/10/20 ECHO EF 45-50%, LV 34/46, S/PW 11/10, LAVI 21, Tr MR/TR, AVG 5mm, Gr I DD.                Recommendations:   Chronic heart failure with reduced systolic function  Hemodynamics are stable.    Transition to oral Bumex 1 mg twice daily.  Transition losartan to Entresto.  Continue carvedilol.  Add spironolactone.    Reason for follow-up visit:   Follow-up visit for heart failure with reduced function, parapneumonic pleural effusion      Interval history:   Hemodynamics are stable  Renal function stable    Review of  Systems:   General: Feeling well.  No fatigue.  No fever.  No chills.  Pulmonary: Denies cough, denies shortness of breath  Cardiovascular: Denies chest pain.  Denies palpitations    Home Medications:     Current Facility-Administered Medications   Medication Dose Route Frequency Provider Last Rate Last Admin    morphine (PF) injection 2 mg  2 mg IntraVENous Q4H PRN Theodis Aguas, MD   2 mg at 01/03/23 2014    cefTRIAXone (ROCEPHIN) 2,000 mg in sodium chloride 0.9 % 50 mL IVPB (mini-bag)  2,000 mg IntraVENous Q24H Romulo, Gretel Acre, MD   Stopped at 01/03/23 2043    heparin (porcine) injection 5,000 Units  5,000 Units SubCUTAneous BID Verlan Friends, MD   5,000 Units at 01/04/23 1478    sodium chloride flush 0.9 % injection 10 mL  10 mL IntraVENous PRN Berton Cactus, MD   10 mL at 01/02/23 0930    oxyCODONE (ROXICODONE) immediate release tablet 5 mg  5 mg Oral Q4H PRN Swiderski, Iris J, PA-C   5 mg at 01/03/23 1245    bisacodyl (DULCOLAX) suppository 10 mg  10 mg Rectal Daily PRN Theodis Aguas, MD  losartan (COZAAR) tablet 25 mg  25 mg Oral Daily Burgess Estelle, MD   25 mg at 01/04/23 0102    docusate sodium (COLACE) capsule 100 mg  100 mg Oral BID Odelia Gage III, MD   100 mg at 01/03/23 0940    benzonatate (TESSALON) capsule 100 mg  100 mg Oral TID PRN Richarda Overlie, DO   100 mg at 01/01/23 2246    albuterol (ACCUNEB) nebulizer solution 1.25 mg  1.25 mg Nebulization Q6H PRN Richarda Overlie, DO        nystatin (MYCOSTATIN) powder   Topical BID Richarda Overlie, DO   Given at 01/04/23 7253    senna (SENOKOT) tablet 8.6 mg  1 tablet Oral Nightly Richarda Overlie, DO   8.6 mg at 01/01/23 2246    polyethylene glycol (GLYCOLAX) packet 17 g  17 g Oral Daily Richarda Overlie, DO   17 g at 01/01/23 6644    furosemide (LASIX) injection 40 mg  40 mg IntraVENous BID Richarda Overlie, DO   40 mg at 01/04/23 0347    potassium chloride (KLOR-CON M) extended release tablet 20 mEq  20 mEq Oral Daily  Richarda Overlie, DO   20 mEq at 01/04/23 0853    carvedilol (COREG) tablet 6.25 mg  6.25 mg Oral BID WC Hutchinson, Anne, DO   6.25 mg at 01/04/23 4259    sodium chloride flush 0.9 % injection 5-40 mL  5-40 mL IntraVENous 2 times per day Richarda Overlie, DO   10 mL at 01/04/23 0908    sodium chloride flush 0.9 % injection 5-40 mL  5-40 mL IntraVENous PRN Hutchinson, Anne, DO        0.9 % sodium chloride infusion   IntraVENous PRN Richarda Overlie, DO        acetaminophen (TYLENOL) tablet 650 mg  650 mg Oral Q6H PRN Richarda Overlie, DO   650 mg at 01/03/23 1245    Or    acetaminophen (TYLENOL) suppository 650 mg  650 mg Rectal Q6H PRN Hutchinson, Anne, DO        lidocaine 4 % external patch 1 patch  1 patch TransDERmal Daily Hutchinson, Anne, DO   1 patch at 01/02/23 0902       Physical Examination:     Patient Vitals for the past 6 hrs:   BP Temp Temp src Pulse Resp   01/04/23 1000 -- -- -- 77 20   01/04/23 0900 126/72 -- -- 78 18   01/04/23 0839 -- 98 F (36.7 C) Temporal -- --         Intake/Output Summary (Last 24 hours) at 01/04/2023 1139  Last data filed at 01/04/2023 0402  Gross per 24 hour   Intake 540.83 ml   Output 1150 ml   Net -609.17 ml       General appearance: The patient is well-appearing in no acute distress  Head: Normocephalic, atraumatic  Eyes: Anicteric sclera, EOMI  Neck: Supple, the JVP is normal  Lungs: Normal chest excursion, clear to auscultation bilaterally, no accessory muscle use  Heart: S1 normal, S2 normal, normal rate, regular rhythm, no murmur appreciated  Abdomen: Soft, nontender  Extremities: Warm, no edema  Skin: Turgor is normal, no obvious rash or ecchymosis    My personal review of the telemetry tracing: Sinus rhythm    Labs:    GFR: Estimated Creatinine Clearance: 98 mL/min (based on SCr of 1.26 mg/dL).     Lab Results   Component Value Date/Time  WBC 27.2 (H) 01/04/2023 04:11 AM    RBC 4.93 01/04/2023 04:11 AM    HGB 14.0 01/04/2023 04:11 AM    HCT 44.1 01/04/2023 04:11 AM     MCV 89.5 01/04/2023 04:11 AM    MCH 28.4 01/04/2023 04:11 AM    MCHC 31.7 01/04/2023 04:11 AM    RDW 47.8 (H) 01/04/2023 04:11 AM    PLT 457 (H) 01/04/2023 04:11 AM    MPV 9.5 01/04/2023 04:11 AM       Lab Results   Component Value Date/Time    BANDS 1.0 01/04/2023 04:11 AM    MONOS 4.0 01/04/2023 04:11 AM    BASOS 0.1 01/04/2023 04:11 AM       Lab Results   Component Value Date    NA 135 (L) 01/04/2023    K 4.0 01/04/2023    CL 99 01/04/2023    CO2 30 01/04/2023    BUN 25 (H) 01/04/2023    CREATININE 1.26 01/04/2023    GLUCOSE 87 01/04/2023    CALCIUM 8.2 (L) 01/04/2023    MG 1.9 01/04/2023       Lab Results   Component Value Date    TROPHS 27 12/31/2022       No results found for: "TSH", "TSH2", "TSH3", "TSHELE", "T3RIA", "T3UP", "FT3", "FT4", "T4", "T4P", "TT7"     Lab Results   Component Value Date    INR 1.6 (H) 01/01/2023    APTT 34.6 12/31/2022        Lab Results   Component Value Date    LABA1C 5.8 (H) 12/31/2022       Lab Results   Component Value Date    CHOL 114 12/31/2022    HDL <20 (L) 12/31/2022    CHOLHDLRATIO 6.0 (H) 12/31/2022    TRIG 127 12/31/2022       Lab Results   Component Value Date    ALT 27 01/01/2023       Lab Results   Component Value Date    PHUR 5.5 12/31/2022    GLUCOSEU Negative 12/31/2022    KETUA 40 (A) 12/31/2022    BLOODU Trace-intact (A) 12/31/2022    NITRU Negative 12/31/2022    LEUKOCYTESUR Negative 12/31/2022    WBCUA 1-4 (A) 12/31/2022    RBCUA OCCASIONAL (A) 12/31/2022    BACTERIA 2+ (A) 12/31/2022         Burgess Estelle, MD  Pager: [phone]  Jan 04, 2023, 11:39 AM

## 2023-01-04 NOTE — Progress Notes (Signed)
THORACIC SURGERY PROGRESS NOTE  Progress Note    Patient: Thomas Browning               Sex: male          DOA: 12/31/2022       Date of Birth:  1965-10-28      Age:  57 y.o.        LOS:  LOS: 4 days     Chart reviewed.    Assessment and Plan:   1.  Right parapneumonic effusion.  Cultures negative.  2.  Patient has responded well to lytic therapy.  Minimal drainage overnight.  Chest x-ray shows excellent resolution of fluid.  3.  Will remove chest tube and sign off case.  Thoracic surgery will be available on an as-needed basis.  No follow-up needed with thoracic surgery.    Problem List:  Principal Problem:    Pleural effusion on right  Active Problems:    SOB (shortness of breath)  Resolved Problems:    * No resolved hospital problems. *            Subjective:    Patient seen and examined on rounds today.  I have independently reviewed all pertinent labs and radiologic films.  Feels well.  No complaints.      Objective:     Vital Signs:  BP (!) 145/80   Pulse 71   Temp 97 F (36.1 C) (Temporal)   Resp 20   Ht 1.803 m (5\' 11" )   Wt (!) 154.3 kg (340 lb 2.7 oz)   SpO2 (!) 88%   BMI 47.44 kg/m   Temp (24hrs), Avg:97.8 F (36.6 C), Min:97 F (36.1 C), Max:99.2 F (37.3 C)    Admission Weight: Last Weight   Weight - Scale: (!) 149.7 kg (330 lb) Weight - Scale: (!) 154.3 kg (340 lb 2.7 oz)       Physical Examination:     General:  Alert, oriented   Lungs: Good inspiratory effort.  Clear.  Heart:  RRR   Abdomen: Benign  Extremities: Warm and well perfused.  Edema - none    Chest Tubes: Minimal drainage overnight.      Intake and Output:  Last three shifts:  05/28 1901 - 05/30 0700  In: 640.8 [P.O.:240]  Out: 4980 [Urine:1600]    Lab Results:  Lab Results   Component Value Date/Time    WBC 27.2 (H) 01/04/2023 04:11 AM    WBC 26.9 (H) 01/03/2023 04:42 AM    WBC 36.2 (H) 01/01/2023 12:30 AM    HCT 44.1 01/04/2023 04:11 AM    HCT 40.9 01/03/2023 04:42 AM    HCT 44.6 01/01/2023 12:30 AM    PLT 457 (H) 01/04/2023 04:11  AM      Lab Results   Component Value Date/Time    NA 135 (L) 01/04/2023 04:11 AM    K 4.0 01/04/2023 04:11 AM    CL 99 01/04/2023 04:11 AM    CO2 30 01/04/2023 04:11 AM    GLUCOSE 87 01/04/2023 04:11 AM    BUN 25 (H) 01/04/2023 04:11 AM       Recent Days:  Recent Labs     01/03/23  0442 01/04/23  0411   WBC 26.9* 27.2*   HGB 13.7 14.0   HCT 40.9 44.1   PLT 417 457*     Recent Labs     01/03/23  0442 01/04/23  0411   NA 135* 135*   K 3.7  4.0   CL 101 99   CO2 27 30   GLUCOSE 137* 87   BUN 32* 25*   CREATININE 1.21 1.26   MG 1.9 1.9     No results for input(s): "PH", "PCO2", "PO2", "HCO3", "FIO2" in the last 72 hours.    Results       Procedure Component Value Units Date/Time    Culture, Body Fluid [[id number]] Collected: 01/01/23 1440    Order Status: Completed Specimen: Body fld Updated: 01/03/23 1238     Gram Stain Result Moderate WBC'S  No Organisms Seen        Culture Result No Growth at 2 days       Culture, Respiratory [[id number]]  (Abnormal) Collected: 12/31/22 1907    Order Status: Completed Specimen: RESPIRATORY Updated: 01/03/23 1154     Gram Stain Result       <10 Epithelial cells/lpf  >25 WBC's/lpf  Rare Gram Positive Cocci  Rare Gram Variable Bacilli       Culture Result       Moderate Commensal Flora Isolated          Culture, Urine [[id number]]  (Abnormal) Collected: 12/31/22 1730    Order Status: Completed Specimen: Urine Updated: 01/02/23 0831     Culture Result --        60,000 CFU/mL  Skin and/or genital contamination      Legionella antigen, urine [[id number]] Collected: 12/31/22 1656    Order Status: Completed Specimen: Urine Updated: 12/31/22 1746     Legionella Urinary Ag NEGATIVE        Comment: Presumptive negative for L. pneumophila serogroup 1 antigen in urine, suggesting no recent or  current infection. Infection due to Legionella cannot be ruled out since other serogroups and  species may cause disease, antigen may not be present in urine in early infection and the level  of antigen  present in the urine may be below the detection limit of the test.           Strep Pneumoniae Antigen [[id number]] Collected: 12/31/22 1656    Order Status: Completed Specimen: Urine Updated: 12/31/22 1746     STREP PNEUMONIAE ANTIGEN, URINE NEGATIVE        Comment: Presumptive negative for pneumococcal pneumonia, suggesting no current or recent pneumococcal  infection.  Infection due to S. pneumoniae cannot be ruled out since the antigen present in the  sample may be below the detection limit of the test.         Blood Culture 1 [[id number]] Collected: 12/31/22 1637    Order Status: Completed Specimen: Blood Updated: 01/04/23 0718     BLOOD CULTURE RESULT No Growth At 72 Hours       Blood Culture 2 [[id number]] Collected: 12/31/22 1637    Order Status: Completed Specimen: Blood Updated: 01/04/23 0718     BLOOD CULTURE RESULT No Growth At 72 Hours       Respiratory Pathogen Panel [[id number]] Collected: 12/31/22 1545    Order Status: Completed Specimen: Nasopharyngeal Swab Updated: 12/31/22 1940     Adenovirus NOT DETECTED        Coronavirus 299E NOT DETECTED        Coronavirus HKU1 NOT DETECTED        Coronavirus NL63 NOT DETECTED        Coronavirus OC43 NOT DETECTED        Human Metapneumovirus NOT DETECTED        INFLUENZA A H1 NOT DETECTED  Influenza H1N1 NOT DETECTED        Influenza A PCR NOT DETECTED        Influenza B PCR NOT DETECTED        Influenza A H3 NOT DETECTED        Parainfluenza 1 NOT DETECTED        Parainfluenza 2 NOT DETECTED        Parainfluenza 3 NOT DETECTED        Parainfluenza NOT DETECTED        Rhinovirus/Enterovirus NOT DETECTED        RSV PCR NOT DETECTED        Bordetella Pertussis NOT DETECTED        Chlamydophilia pneumoniae NOT DETECTED        Mycoplasma pneumoniae NOT DETECTED        SARS-CoV-2 NOT DETECTED        Comment: A Not Detected (negative) test result for this test means that SARS- CoV-2 RNA was not present  in the specimen above the limit of detection. However, a  negative result does not rule out the  possibility of COVID-19 and should not be used as the sole basis for treatment or patient  management decisions.   This test for SARS-CoV2 has been authorized by FDA under an Emergency Use Authorization (EUA).         Rapid Strep Screen [[id number]] Collected: 12/31/22 1545    Order Status: Completed Specimen: Throat Updated: 12/31/22 1747     Strep A Ag       Negative - No Streptococcus Group A DNA was Detected.           Comment: NEGATIVE Streptococcus group A PCR do NOT REFLEX to culture since PCR is very sensitive (equal  to culture for Strep. group A), however, if pharyngitis is suspected by another beta hemolytic  Streptococcus group then you must order a throat culture. Call microbiology ext [phone] if culture  is needed to be added on for testing.         MRSA by PCR [[id number]] Collected: 12/31/22 1545    Order Status: Completed Specimen: Nares Updated: 12/31/22 1852     MRSA PCR screen NEGATIVE                RADIOLOGY:    XR CHEST PORTABLE  Narrative: XR CHEST PORTABLE    Clinical indication: re-evaluate right lung s/p lysis.    Comparison: 01/03/2023    Findings:    Support devices: Stable right lung base pigtail chest tube    Lungs: Slightly improved right lung base opacities. Persistent opacities remain.  Remaining lungs are clear.    Cardiomediastinal silhouette: Normal morphology for radiographic technique.    Bones/soft tissues: No acute abnormalities.  Impression: IMPRESSION:      1.  Improved right lung base opacities.  2.  Stable right chest tube    Electronically signed by: Linus Galas, DO 01/04/2023 5:08 AM EDT            Workstation ID: [id number]      Home Medications:  None        Current Medications:  Current Facility-Administered Medications   Medication Dose Route Frequency    morphine (PF) injection 2 mg  2 mg IntraVENous Q4H PRN    cefTRIAXone (ROCEPHIN) 2,000 mg in sodium chloride 0.9 % 50 mL IVPB (mini-bag)  2,000 mg IntraVENous Q24H    heparin  (porcine) injection 5,000 Units  5,000 Units SubCUTAneous BID  sodium chloride flush 0.9 % injection 10 mL  10 mL IntraVENous PRN    ALTEplase (CATHFLO) 10 mg in sodium chloride 0.9 % 30 mL  10 mg IntraPLEUral Q12H    And    dornase alpha (PULMOZYME) 5 mg in sterile water 30 mL  5 mg IntraPLEUral Q12H    oxyCODONE (ROXICODONE) immediate release tablet 5 mg  5 mg Oral Q4H PRN    bisacodyl (DULCOLAX) suppository 10 mg  10 mg Rectal Daily PRN    losartan (COZAAR) tablet 25 mg  25 mg Oral Daily    docusate sodium (COLACE) capsule 100 mg  100 mg Oral BID    benzonatate (TESSALON) capsule 100 mg  100 mg Oral TID PRN    albuterol (ACCUNEB) nebulizer solution 1.25 mg  1.25 mg Nebulization Q6H PRN    nystatin (MYCOSTATIN) powder   Topical BID    senna (SENOKOT) tablet 8.6 mg  1 tablet Oral Nightly    polyethylene glycol (GLYCOLAX) packet 17 g  17 g Oral Daily    furosemide (LASIX) injection 40 mg  40 mg IntraVENous BID    potassium chloride (KLOR-CON M) extended release tablet 20 mEq  20 mEq Oral Daily    carvedilol (COREG) tablet 6.25 mg  6.25 mg Oral BID WC    sodium chloride flush 0.9 % injection 5-40 mL  5-40 mL IntraVENous 2 times per day    sodium chloride flush 0.9 % injection 5-40 mL  5-40 mL IntraVENous PRN    0.9 % sodium chloride infusion   IntraVENous PRN    acetaminophen (TYLENOL) tablet 650 mg  650 mg Oral Q6H PRN    Or    acetaminophen (TYLENOL) suppository 650 mg  650 mg Rectal Q6H PRN    lidocaine 4 % external patch 1 patch  1 patch TransDERmal Daily           This note was generated with the aid of a dictation system.  Unintentional errors may be present.  Cheyenne Adas, MD  Jan 04, 2023  8:14 AM

## 2023-01-04 NOTE — Progress Notes (Signed)
Medical Progress Note      NAME: Thomas Browning   DOB:  12-01-1965  MRN:             [id number]    Date:  01/04/2023        Assessment:     Large loculated right pleural effusion  Acute hypoxic respiratory failure  Acute on chronic systolic, diastolic  Sepsis, suspect respiratory source  Hypertensive urgency  Prolonged QTc  DM 2,   Morbid obesity    Plan:     Chest tube removed  Respiratory status stable  Continue antibiotics per ID recommendation  Monitor renal function and electrolytes, replete as needed  Monitor blood glucose and continue insulin per Glucomander  Bowel Regimen for constipation  Patient has full CODE STATUS  DVT prophylaxis  Possible discharge tomorrow    Subjective:     Shortness of breathing has improved    Objective:     Vitals:    Last 24hrs VS reviewed since prior progress note. Most recent are:  Vitals:    01/04/23 1622   BP: (!) 154/91   Pulse: 80   Resp: 20   Temp: (!) 96.6 F (35.9 C)   SpO2: 94%     SpO2 Readings from Last 6 Encounters:   01/04/23 94%          Intake/Output Summary (Last 24 hours) at 01/04/2023 1720  Last data filed at 01/04/2023 0402  Gross per 24 hour   Intake 540.83 ml   Output 200 ml   Net 340.83 ml          Exam:     General:   Not in acute distress  HEENT: PERRLA, Neck Supple,  No JVD  Respiratory:   CTA bilaterally-no wheezes, rales, rhonchi, or crackles  Cardiac:  Regular Rate and Rythmn  - no murmurs, rubs or gallops  Abdominal:  Soft, non-tender, non-distended, positive bowel sounds  Extremities:  No cyanosis, or edema.  Skin: No rash  Neurological:  No focal neurological deficits    Medication:   Current Medications Reviewed    Current Facility-Administered Medications:     morphine (PF) injection 2 mg, 2 mg, IntraVENous, Q4H PRN, Theodis Aguas, MD, 2 mg at 01/03/23 2014    cefTRIAXone (ROCEPHIN) 2,000 mg in sodium chloride 0.9 % 50 mL IVPB (mini-bag), 2,000 mg, IntraVENous, Q24H, Romulo, Gretel Acre, MD, Stopped at 01/03/23 2043    heparin (porcine) injection 5,000  Units, 5,000 Units, SubCUTAneous, BID, Verlan Friends, MD, 5,000 Units at 01/04/23 4540    sodium chloride flush 0.9 % injection 10 mL, 10 mL, IntraVENous, PRN, Williams Che, Mohsin, MD, 10 mL at 01/02/23 0930    oxyCODONE (ROXICODONE) immediate release tablet 5 mg, 5 mg, Oral, Q4H PRN, Swiderski, Iris J, PA-C, 5 mg at 01/03/23 1245    bisacodyl (DULCOLAX) suppository 10 mg, 10 mg, Rectal, Daily PRN, Theodis Aguas, MD    losartan (COZAAR) tablet 25 mg, 25 mg, Oral, Daily, McCray, Robert D, MD, 25 mg at 01/04/23 9811    docusate sodium (COLACE) capsule 100 mg, 100 mg, Oral, BID, Odelia Gage III, MD, 100 mg at 01/03/23 0940    benzonatate (TESSALON) capsule 100 mg, 100 mg, Oral, TID PRN, Hutchinson, Anne, DO, 100 mg at 01/01/23 2246    albuterol (ACCUNEB) nebulizer solution 1.25 mg, 1.25 mg, Nebulization, Q6H PRN, Hutchinson, Anne, DO    nystatin (MYCOSTATIN) powder, , Topical, BID, Hutchinson, Anne, DO, Given at 01/04/23 0907    senna (  SENOKOT) tablet 8.6 mg, 1 tablet, Oral, Nightly, Hutchinson, Anne, DO, 8.6 mg at 01/01/23 2246    polyethylene glycol (GLYCOLAX) packet 17 g, 17 g, Oral, Daily, Hutchinson, Anne, DO, 17 g at 01/01/23 0925    furosemide (LASIX) injection 40 mg, 40 mg, IntraVENous, BID, Hutchinson, Anne, DO, 40 mg at 01/04/23 1610    potassium chloride (KLOR-CON M) extended release tablet 20 mEq, 20 mEq, Oral, Daily, Hutchinson, Anne, DO, 20 mEq at 01/04/23 0853    carvedilol (COREG) tablet 6.25 mg, 6.25 mg, Oral, BID WC, Hutchinson, Anne, DO, 6.25 mg at 01/04/23 9604    sodium chloride flush 0.9 % injection 5-40 mL, 5-40 mL, IntraVENous, 2 times per day, Hutchinson, Anne, DO, 10 mL at 01/04/23 0908    sodium chloride flush 0.9 % injection 5-40 mL, 5-40 mL, IntraVENous, PRN, Hutchinson, Anne, DO    0.9 % sodium chloride infusion, , IntraVENous, PRN, Hutchinson, Anne, DO    acetaminophen (TYLENOL) tablet 650 mg, 650 mg, Oral, Q6H PRN, 650 mg at 01/03/23 1245 **OR** acetaminophen (TYLENOL)  suppository 650 mg, 650 mg, Rectal, Q6H PRN, Hutchinson, Anne, DO    lidocaine 4 % external patch 1 patch, 1 patch, TransDERmal, Daily, Hutchinson, Anne, DO, 1 patch at 01/02/23 0902      Lab:     Lab Data Reviewed: (see below)  Recent Results (from the past 24 hour(s))   Basic Metabolic Panel    Collection Time: 01/04/23  4:11 AM   Result Value Ref Range    Potassium 4.0 3.5 - 5.1 mEq/L    Chloride 99 98 - 107 mEq/L    Sodium 135 (L) 136 - 145 mEq/L    CO2 30 20 - 31 mEq/L    Glucose 87 74 - 106 mg/dl    BUN 25 (H) 9 - 23 mg/dl    Creatinine 5.40 9.81 - 1.30 mg/dl    GFR African American >60      GFR Non-African American >60      Calcium 8.2 (L) 8.7 - 10.4 mg/dl    Anion Gap 7 5 - 15 mmol/L   CBC with Auto Differential    Collection Time: 01/04/23  4:11 AM   Result Value Ref Range    WBC 27.2 (H) 4.0 - 11.0 1000/mm3    Neutrophils Segmented 71.0 (H) 34 - 64 %    RBC 4.93 3.80 - 5.70 M/uL    Lymphocytes 13.0 (L) 28 - 48 %    Hemoglobin 14.0 13.0 - 18.0 gm/dl    Monocytes 4.0 1 - 13 %    Hematocrit 44.1 36.0 - 55.0 %    Eosinophils 2.0 0 - 5 %    MCV 89.5 80.0 - 98.0 fL    MCH 28.4 23.0 - 34.6 pg    Band Neutrophils 1.0 0 - 11 %    MCHC 31.7 30.0 - 36.0 gm/dl    Metamyelocytes 4.0 (H) 0 - 0 %    Platelets 457 (H) 140 - 450 1000/mm3    Myelocytes 4.0 (H) 0 - 0 %    MPV 9.5 6.0 - 10.0 fL    RDW 47.8 (H) 35.1 - 43.9      Atypical Lymphocytes 1.0 (H) 0 - 0 %    Nucleated RBCs 0 0 - 0      Immature Granulocytes % 20.2 (HH) 0.0 - 3.0 %    Anisocytosis 1      Polychromasia 1      Macrocytes 1  Basophils 0.1 0 - 3 %    Smudge Cells 39.0      Giant PLTs 2.0      Platelet Appearance NORMAL     Magnesium    Collection Time: 01/04/23  4:11 AM   Result Value Ref Range    Magnesium 1.9 1.6 - 2.6 mg/dL       @[id number] @    Principal Problem:    Pleural effusion on right  Active Problems:    SOB (shortness of breath)  Resolved Problems:    * No resolved hospital problems.  *    ____________________________________________________________________      Attending Physician: Theodis Aguas, MD       Dragon medical dictation software was used for portions of this report. Unintended voice recognition errors may occur.

## 2023-01-04 NOTE — Plan of Care (Signed)
Problem: Respiratory - Adult  Goal: Achieves optimal ventilation and oxygenation  Outcome: Progressing     Problem: Cardiovascular - Adult  Goal: Maintains optimal cardiac output and hemodynamic stability  Outcome: Progressing

## 2023-01-04 NOTE — Telephone Encounter (Signed)
----------  DocumentID: [id number] (AP)-------------------------------------------              [facility]                       Patient Education Report         Name: Thomas Browning, Thomas Browning                  Date: 01/04/2023    MRN: [id number]                    Time: 2:48:18 PM         Patient ordered video: 'Patient Safety: Stay Safe While you are in the Hospital'    from [id number] via phone number: [phone] at 2:48:18 PM    Description: This program outlines some of the precautions patients can take to ensure a speedy recovery without extra complications. The video emphasizes the importance of communicating with the healthcare team.

## 2023-01-04 NOTE — Progress Notes (Signed)
[facility]   (A Division of [facility])    Follow up Note      Date of Admission: 12/31/2022    Date of Note: 01/04/23        Subjective / Interval History:  Anxious about everything.  Worried that he's not moving his bowels enough.  Scared that food gets stuck in his throat and he might die.  Right chest tube removed today.            Current Antimicrobials:    Prior Antimicrobials:  Levofloxacin 5/30 - 0  Doxycycline 5/26 - 1  Vanc 5/26 - 1 Piptazo 5/26 - 3 Ceftriaxone 5/29 - 1          Assessment:  Rec / Plan:   Right Pleural effusion, parapneumonic  - two weeks worsening cough, some mild chills, sweats, no fever, + hoarseness  - WBC count 34.5  - CXR 5/26:  mod to large R loculated pleural effusion w/ atelectasis in underlying lung  - spcx 5/26 Haemophilus influenzae  - Urag neg Legionella, S pneumo  - refused CT placement in ER 5/26  - s/p 5/27 CT guided R chest tube placement - 300 cc dark yellow, mildly bloody fluid. 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  He has chronic "allergic" symptoms of rhinorrhea, cough that he uses OTC allergy medications for.  Around 2 weeks PTA he noticed worsening of his cough with shortness of breath.  Cough was productive of light to dark-brown sputum, occasionally blood-tinged.  Had no fever but had occasional mild chills and non-drenching sweats. Developed hoarseness which worsened around one week prior to admission and dyspnea worsened leading to his presentation at ER 5/26.   Afebrile but WBC count was 34.5 and CXR showed a moderated to large R loculated pleural effusion w/ atelectasis in underlying lung.  Left lung is clear.  Acute Respiratory Pathogen Panel was negative. Seen by Dr. Effie Shy of Cardiothoracic Surgery who offered Chest tube placement in ED but the patient declined so CT guided Chest tube placement was scheduled.  Vancomycin, Piperacillin-tazobactam and Doxycycline started 5/26.     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  He has no known exposure to TB, does not keep pet birds, is not exposed to poultry or other livestock.  He does not smoke cigarettes or vape but admits to occasional "weed".  Denies IV drug use       Current Facility-Administered Medications   Medication Dose Route Frequency Provider Last Rate Last Admin    morphine (PF) injection 2 mg  2 mg IntraVENous Q4H PRN Theodis Aguas, MD   2 mg at 01/03/23 2014    cefTRIAXone (ROCEPHIN) 2,000 mg in sodium chloride 0.9 % 50 mL IVPB (mini-bag)  2,000 mg IntraVENous Q24H Westen Dinino, Gretel Acre, MD   Stopped at 01/03/23 2043    heparin (porcine)  injection 5,000 Units  5,000 Units SubCUTAneous BID Verlan Friends, MD   5,000 Units at 01/04/23 4782    sodium chloride flush 0.9 % injection 10 mL  10 mL IntraVENous PRN Berton Higganum, MD   10 mL at 01/02/23 0930    oxyCODONE (ROXICODONE) immediate release tablet 5 mg  5 mg Oral Q4H PRN Swiderski, Iris J, PA-C   5 mg at 01/03/23 1245    bisacodyl (DULCOLAX) suppository 10 mg  10 mg Rectal Daily PRN Theodis Aguas, MD        losartan (COZAAR) tablet 25 mg  25 mg Oral Daily Burgess Estelle, MD   25 mg at 01/04/23 9562    docusate sodium (COLACE) capsule 100 mg  100 mg Oral BID Odelia Gage III, MD   100 mg at 01/03/23 0940    benzonatate (TESSALON) capsule 100 mg  100 mg Oral TID PRN Richarda Overlie, DO   100 mg at 01/01/23 2246    albuterol (ACCUNEB) nebulizer solution 1.25 mg  1.25 mg Nebulization Q6H PRN Richarda Overlie, DO         nystatin (MYCOSTATIN) powder   Topical BID Richarda Overlie, DO   Given at 01/04/23 1308    senna (SENOKOT) tablet 8.6 mg  1 tablet Oral Nightly Richarda Overlie, DO   8.6 mg at 01/01/23 2246    polyethylene glycol (GLYCOLAX) packet 17 g  17 g Oral Daily Richarda Overlie, DO   17 g at 01/01/23 6578    furosemide (LASIX) injection 40 mg  40 mg IntraVENous BID Richarda Overlie, DO   40 mg at 01/04/23 4696    potassium chloride (KLOR-CON M) extended release tablet 20 mEq  20 mEq Oral Daily Richarda Overlie, DO   20 mEq at 01/04/23 0853    carvedilol (COREG) tablet 6.25 mg  6.25 mg Oral BID WC Hutchinson, Anne, DO   6.25 mg at 01/04/23 2952    sodium chloride flush 0.9 % injection 5-40 mL  5-40 mL IntraVENous 2 times per day Richarda Overlie, DO   10 mL at 01/04/23 0908    sodium chloride flush 0.9 % injection 5-40 mL  5-40 mL IntraVENous PRN Hutchinson, Anne, DO        0.9 % sodium chloride infusion   IntraVENous PRN Richarda Overlie, DO        acetaminophen (TYLENOL) tablet 650 mg  650 mg Oral Q6H PRN Richarda Overlie, DO   650 mg at 01/03/23 1245    Or    acetaminophen (TYLENOL) suppository 650 mg  650 mg Rectal Q6H PRN Hutchinson, Anne, DO        lidocaine 4 % external patch 1 patch  1 patch TransDERmal Daily Hutchinson, Anne, DO   1 patch at 01/02/23 0902          ROS:  Negative except as in interval history      Objective:    Vitals:    01/04/23 1000   BP:    Pulse: 77   Resp: 20   Temp:    SpO2:           Physical Exam   Constitutional:       General: He is not in acute distress.     Appearance: He is obese. He is not toxic-appearing.   HENT:      Head: Normocephalic and atraumatic.      Nose: Nose normal.      Mouth/Throat:      Pharynx: Oropharynx is  clear.   Eyes:      Extraocular Movements: Extraocular movements intact.      Conjunctiva/sclera: Conjunctivae normal.   Cardiovascular:      Rate and Rhythm: Regular rhythm.      Heart sounds: No murmur heard.     No gallop.   Pulmonary:      Comments: improved  breath sounds right base, egophony right midlung.  few crackles R midlung no wheezes Chest tube out  Abdominal:      Palpations: Abdomen is soft.      Tenderness: There is no abdominal tenderness.   Musculoskeletal:      Cervical back: Neck supple.      Right lower leg: No edema.      Left lower leg: No edema.   Skin:     Findings: No rash.   Neurological:      Mental Status: He is alert and oriented to person, place, and time.   Psychiatric:         Mood and Affect: Mood normal.         Behavior: Behavior normal.     Recent Labs     01/04/23  0411 01/03/23  0442 01/01/23  0030   WBC 27.2* 26.9* 36.2*   HGB 14.0 13.7 14.4   HCT 44.1 40.9 44.6   MCV 89.5 87.6 87.3   PLT 457* 417 444          Recent Labs     01/04/23  0411 01/03/23  0442 01/01/23  0030   BUN 25* 32* 27*   CREATININE 1.26 1.21 1.26         Results       Procedure Component Value Units Date/Time    Culture, Body Fluid [[id number]] Collected: 01/01/23 1440    Order Status: Completed Specimen: Body fld Updated: 01/04/23 1055     Gram Stain Result Moderate WBC'S  No Organisms Seen        Culture Result No Growth at 3 days       Culture, Respiratory [[id number]]  (Abnormal) Collected: 12/31/22 1907    Order Status: Completed Specimen: RESPIRATORY Updated: 01/04/23 1237     Gram Stain Result       <10 Epithelial cells/lpf  >25 WBC's/lpf  Rare Gram Positive Cocci  Rare Gram Variable Bacilli       Culture Result       Moderate Commensal Flora Isolated           ISOLATE --        Moderate  Haemophilus influenzae  Beta-Lactamase Positive      Culture, Urine [[id number]]  (Abnormal) Collected: 12/31/22 1730    Order Status: Completed Specimen: Urine Updated: 01/02/23 0831     Culture Result --        60,000 CFU/mL  Skin and/or genital contamination      Legionella antigen, urine [[id number]] Collected: 12/31/22 1656    Order Status: Completed Specimen: Urine Updated: 12/31/22 1746     Legionella Urinary Ag NEGATIVE        Comment: Presumptive negative for L.  pneumophila serogroup 1 antigen in urine, suggesting no recent or  current infection. Infection due to Legionella cannot be ruled out since other serogroups and  species may cause disease, antigen may not be present in urine in early infection and the level  of antigen present in the urine may be below the detection limit of the test.  Strep Pneumoniae Antigen [[id number]] Collected: 12/31/22 1656    Order Status: Completed Specimen: Urine Updated: 12/31/22 1746     STREP PNEUMONIAE ANTIGEN, URINE NEGATIVE        Comment: Presumptive negative for pneumococcal pneumonia, suggesting no current or recent pneumococcal  infection.  Infection due to S. pneumoniae cannot be ruled out since the antigen present in the  sample may be below the detection limit of the test.         Blood Culture 1 [[id number]] Collected: 12/31/22 1637    Order Status: Completed Specimen: Blood Updated: 01/04/23 0718     BLOOD CULTURE RESULT No Growth At 72 Hours       Blood Culture 2 [[id number]] Collected: 12/31/22 1637    Order Status: Completed Specimen: Blood Updated: 01/04/23 0718     BLOOD CULTURE RESULT No Growth At 72 Hours       Respiratory Pathogen Panel [[id number]] Collected: 12/31/22 1545    Order Status: Completed Specimen: Nasopharyngeal Swab Updated: 12/31/22 1940     Adenovirus NOT DETECTED        Coronavirus 299E NOT DETECTED        Coronavirus HKU1 NOT DETECTED        Coronavirus NL63 NOT DETECTED        Coronavirus OC43 NOT DETECTED        Human Metapneumovirus NOT DETECTED        INFLUENZA A H1 NOT DETECTED        Influenza H1N1 NOT DETECTED        Influenza A PCR NOT DETECTED        Influenza B PCR NOT DETECTED        Influenza A H3 NOT DETECTED        Parainfluenza 1 NOT DETECTED        Parainfluenza 2 NOT DETECTED        Parainfluenza 3 NOT DETECTED        Parainfluenza NOT DETECTED        Rhinovirus/Enterovirus NOT DETECTED        RSV PCR NOT DETECTED        Bordetella Pertussis NOT DETECTED         Chlamydophilia pneumoniae NOT DETECTED        Mycoplasma pneumoniae NOT DETECTED        SARS-CoV-2 NOT DETECTED        Comment: A Not Detected (negative) test result for this test means that SARS- CoV-2 RNA was not present  in the specimen above the limit of detection. However, a negative result does not rule out the  possibility of COVID-19 and should not be used as the sole basis for treatment or patient  management decisions.   This test for SARS-CoV2 has been authorized by FDA under an Emergency Use Authorization (EUA).         Rapid Strep Screen [[id number]] Collected: 12/31/22 1545    Order Status: Completed Specimen: Throat Updated: 12/31/22 1747     Strep A Ag       Negative - No Streptococcus Group A DNA was Detected.           Comment: NEGATIVE Streptococcus group A PCR do NOT REFLEX to culture since PCR is very sensitive (equal  to culture for Strep. group A), however, if pharyngitis is suspected by another beta hemolytic  Streptococcus group then you must order a throat culture. Call microbiology ext [phone] if culture  is needed to be added on for testing.  MRSA by PCR [[id number]] Collected: 12/31/22 1545    Order Status: Completed Specimen: Nares Updated: 12/31/22 1852     MRSA PCR screen NEGATIVE                    Jerilynn Som, MD, FIDSA  [facility]

## 2023-01-04 NOTE — Progress Notes (Signed)
0825 - Right 12Fr chest tube removed at bedside.  Patient tolerated well.  Dressing to remain in place x 48 hour (reinforce if needed), then may be removed and open to air.  Per Dr. Mable Paris note, thoracic surgery signing off, no need for outpatient follow up.    Paulina Fusi, DHSc, PA-C  Cardiac and Thoracic Surgery  01/04/2023 [phone]

## 2023-01-05 DIAGNOSIS — I10 Essential (primary) hypertension: Secondary | ICD-10-CM | POA: Diagnosis not present

## 2023-01-05 DIAGNOSIS — J90 Pleural effusion, not elsewhere classified: Secondary | ICD-10-CM | POA: Diagnosis not present

## 2023-01-05 DIAGNOSIS — I429 Cardiomyopathy, unspecified: Secondary | ICD-10-CM | POA: Diagnosis not present

## 2023-01-05 DIAGNOSIS — J302 Other seasonal allergic rhinitis: Secondary | ICD-10-CM | POA: Diagnosis not present

## 2023-01-05 DIAGNOSIS — I5021 Acute systolic (congestive) heart failure: Secondary | ICD-10-CM | POA: Diagnosis not present

## 2023-01-05 MED ORDER — LEVOFLOXACIN 250 MG PO TABS
250 | Freq: Every day | ORAL | Status: DC
Start: 2023-01-05 — End: 2023-01-10
  Administered 2023-01-05 – 2023-01-10 (×6): 750 mg via ORAL

## 2023-01-05 MED FILL — FUROSEMIDE 10 MG/ML IJ SOLN: 10 MG/ML | INTRAMUSCULAR | Qty: 4

## 2023-01-05 MED FILL — HEPARIN SODIUM (PORCINE) 5000 UNIT/ML IJ SOLN: 5000 UNIT/ML | INTRAMUSCULAR | Qty: 1

## 2023-01-05 MED FILL — DOCUSATE SODIUM 100 MG PO CAPS: 100 MG | ORAL | Qty: 1

## 2023-01-05 MED FILL — OXYCODONE HCL 5 MG PO TABS: 5 MG | ORAL | Qty: 1

## 2023-01-05 MED FILL — CARVEDILOL 6.25 MG PO TABS: 6.25 MG | ORAL | Qty: 1

## 2023-01-05 MED FILL — POTASSIUM CHLORIDE CRYS ER 20 MEQ PO TBCR: 20 MEQ | ORAL | Qty: 1

## 2023-01-05 MED FILL — BENZONATATE 100 MG PO CAPS: 100 MG | ORAL | Qty: 1

## 2023-01-05 MED FILL — GNP LIDOCAINE PAIN RELIEF 4 % EX PTCH: 4 % | CUTANEOUS | Qty: 1

## 2023-01-05 MED FILL — MORPHINE SULFATE 2 MG/ML IJ SOLN: 2 mg/mL | INTRAMUSCULAR | Qty: 1

## 2023-01-05 MED FILL — POLYETHYLENE GLYCOL 3350 17 G PO PACK: 17 g | ORAL | Qty: 1

## 2023-01-05 MED FILL — LOSARTAN POTASSIUM 25 MG PO TABS: 25 MG | ORAL | Qty: 1

## 2023-01-05 MED FILL — SODIUM CHLORIDE FLUSH 0.9 % IV SOLN: 0.9 % | INTRAVENOUS | Qty: 10

## 2023-01-05 MED FILL — LEVOFLOXACIN 250 MG PO TABS: 250 MG | ORAL | Qty: 3

## 2023-01-05 MED FILL — GERI-KOT 8.6 MG PO TABS: 8.6 MG | ORAL | Qty: 1

## 2023-01-05 NOTE — Progress Notes (Signed)
[facility] staff attempted to contact patient to schedule a [facility] follow-up appointment            Patient didnt answer room phone. Left message for patient to call if he wants [facility] follow up/assistance with obtaining a PCP 05/31/24TLW

## 2023-01-05 NOTE — Progress Notes (Signed)
Cardiology Progress Note      Patient: Thomas Browning Age: 57 y.o. Sex: male    Date of Birth: [birth year] Admit Date: 12/31/2022 PCP: Unknown, Provider, APRN - NP   MRN: [id number]  CSN: [id number]        ASSESSMENT AND RECOMMENDATIONS:   57 year old gentleman with morbid obesity who presented with progressively worsening dyspnea for 2 weeks.  He was noted to have leukocytosis, right pleural effusion and elevated BNP.  His subsequent cardiac evaluation notable for cardiomyopathy.     Cardiomyopathy: LVEF 39%, grade 2 diastolic dysfunction.    Etiology of remains unclear.  Will plan for ischemic evaluation with a coronary CTA.  Continue carvedilol 6.25 mg twice daily and losartan 25 mg daily.  Will add MRA and SGLT2 inhibitors as tolerated.     Acute heart failure with reduced ejection fraction (HFrEF):   Continue diuresis with Lasix 40 mg IV push twice daily    3.  Pneumonia/pleural effusion: Empyema versus parapneumonic effusion.  Status post CT-guided right pigtail catheter placement on 01/01/2023.  Defer to pulmonology and the primary team.    4.  Pulmonary hypertension: Moderate pulmonary hypertension with RVSP 47 mmHg.  Likely related to heart failure and pulmonary disease, management of which is reasonable.     INTERIM EVENTS:   Reports that his dyspnea symptoms have significantly improved.  Denies any chest pain or palpitations.     PHYSICAL EXAM:     VS: BP 124/88   Pulse 72   Temp 97.2 F (36.2 C) (Temporal)   Resp 20   Ht 1.803 m (5\' 11" )   Wt (!) 154.3 kg (340 lb 2.7 oz)   SpO2 97%   BMI 47.44 kg/m   @WEIGHTLAST3IP @   Body mass index is 47.44 kg/m.     Intake/Output Summary (Last 24 hours) at 01/05/2023 1918  Last data filed at 01/05/2023 1458  Gross per 24 hour   Intake 120 ml   Output 1550 ml   Net -1430 ml       Physical examination:  GEN: alert, orientedx3, in respiratory distress  HEENT: PERRLA, mucous membranes are moist, No jugular venous distention or carotid bruit is noted  PULM: Decreased  breath sounds over left lung base, no wheezing  CARD:  Normal S1/S2, no m/r/g  ABD:  Soft, non-tender, non-distended  NEURO:  No focal deficit, CN II-XII intact  EXT: 1+ edema of the bilateral knees    CARDIOVASCULAR STUDIES:   ECG 12/31/22: NSR 96 bpm, iLBBB, prolonged QTc (485 ms)   TTE 12/25/2022: LVEF 39%, grade 2 diastolic function, moderately dilated right ventricle with normal function, no hemodynamically significant valvular disease    LABS:     Basic Metabolic Profile   Lab Results   Component Value Date/Time    NA 135 (L) 01/04/2023 04:11 AM    NA 135 (L) 01/03/2023 04:42 AM    NA 135 (L) 01/01/2023 12:30 AM    K 4.0 01/04/2023 04:11 AM    K 3.7 01/03/2023 04:42 AM    K 3.9 01/01/2023 12:30 AM    CL 99 01/04/2023 04:11 AM    CL 101 01/03/2023 04:42 AM    CL 100 01/01/2023 12:30 AM    CO2 30 01/04/2023 04:11 AM    CO2 27 01/03/2023 04:42 AM    CO2 22 01/01/2023 12:30 AM    BUN 25 (H) 01/04/2023 04:11 AM    BUN 32 (H) 01/03/2023 04:42 AM    BUN 27 (H) 01/01/2023  12:30 AM    CREATININE 1.26 01/04/2023 04:11 AM    CREATININE 1.21 01/03/2023 04:42 AM    CREATININE 1.26 01/01/2023 12:30 AM   .       Lab Results   Component Value Date/Time    WBC 27.2 (H) 01/04/2023 04:11 AM    WBC 26.9 (H) 01/03/2023 04:42 AM    HGB 14.0 01/04/2023 04:11 AM    HGB 13.7 01/03/2023 04:42 AM    HGB 14.4 01/01/2023 12:30 AM    HCT 44.1 01/04/2023 04:11 AM    HCT 40.9 01/03/2023 04:42 AM    HCT 44.6 01/01/2023 12:30 AM    PLT 457 (H) 01/04/2023 04:11 AM    PLT 417 01/03/2023 04:42 AM         Cardiac Enzymes   No components found for: "3", "TROP"     Coagulation   Lab Results   Component Value Date/Time    INR 1.6 (H) 01/01/2023 12:30 AM    INR 1.7 (H) 12/31/2022 04:54 PM    APTT 34.6 12/31/2022 04:54 PM        Lipids/A1C   Lab Results   Component Value Date    LABA1C 5.8 (H) 12/31/2022       Lab Results   Component Value Date    CHOL 114 12/31/2022    HDL <20 (L) 12/31/2022    CHOLHDLRATIO 6.0 (H) 12/31/2022    TRIG 127 12/31/2022         MEDICATIONS:     Current Facility-Administered Medications   Medication Dose Route Frequency Provider Last Rate Last Admin    levoFLOXacin (LEVAQUIN) tablet 750 mg  750 mg Oral Daily Romulo, Rodrigo L, MD   750 mg at 01/05/23 1610    morphine (PF) injection 2 mg  2 mg IntraVENous Q4H PRN Theodis Aguas, MD   2 mg at 01/05/23 1628    heparin (porcine) injection 5,000 Units  5,000 Units SubCUTAneous BID Verlan Friends, MD   5,000 Units at 01/05/23 9604    sodium chloride flush 0.9 % injection 10 mL  10 mL IntraVENous PRN Williams Che, Iley Deignan, MD   10 mL at 01/02/23 0930    bisacodyl (DULCOLAX) suppository 10 mg  10 mg Rectal Daily PRN Theodis Aguas, MD        losartan (COZAAR) tablet 25 mg  25 mg Oral Daily Burgess Estelle, MD   25 mg at 01/05/23 0818    docusate sodium (COLACE) capsule 100 mg  100 mg Oral BID Odelia Gage III, MD   100 mg at 01/05/23 0818    benzonatate (TESSALON) capsule 100 mg  100 mg Oral TID PRN Richarda Overlie, DO   100 mg at 01/05/23 1628    albuterol (ACCUNEB) nebulizer solution 1.25 mg  1.25 mg Nebulization Q6H PRN Richarda Overlie, DO        nystatin (MYCOSTATIN) powder   Topical BID Richarda Overlie, DO   Given at 01/05/23 1209    senna (SENOKOT) tablet 8.6 mg  1 tablet Oral Nightly Hutchinson, Anne, DO   8.6 mg at 01/01/23 2246    polyethylene glycol (GLYCOLAX) packet 17 g  17 g Oral Daily Hutchinson, Anne, DO   17 g at 01/05/23 0818    furosemide (LASIX) injection 40 mg  40 mg IntraVENous BID Hutchinson, Anne, DO   40 mg at 01/05/23 0818    potassium chloride (KLOR-CON M) extended release tablet 20 mEq  20 mEq Oral Daily Richarda Overlie, DO  20 mEq at 01/05/23 0818    carvedilol (COREG) tablet 6.25 mg  6.25 mg Oral BID WC Hutchinson, Anne, DO   6.25 mg at 01/05/23 1628    sodium chloride flush 0.9 % injection 5-40 mL  5-40 mL IntraVENous 2 times per day Richarda Overlie, DO   10 mL at 01/05/23 1610    sodium chloride flush 0.9 % injection 5-40 mL  5-40 mL IntraVENous PRN  Hutchinson, Anne, DO        0.9 % sodium chloride infusion   IntraVENous PRN Richarda Overlie, DO        acetaminophen (TYLENOL) tablet 650 mg  650 mg Oral Q6H PRN Richarda Overlie, DO   650 mg at 01/03/23 1245    Or    acetaminophen (TYLENOL) suppository 650 mg  650 mg Rectal Q6H PRN Hutchinson, Anne, DO        lidocaine 4 % external patch 1 patch  1 patch TransDERmal Daily Hutchinson, Anne, DO   1 patch at 01/05/23 9604     Thank you for involving [place] in Mr. Lapinsky's care.  We will continue to follow.  Please call with any questions or concerns.    Breane Grunwald Williams Che, MD, [facility], RPVI, [facility]  Interventional Cardiologist  [facility]  Pager: [phone]

## 2023-01-05 NOTE — Progress Notes (Signed)
[facility]     Name: Thomas Browning MRN: [id number]   DOB: Jul 29, 1966 Hospital: [facility]   Date: 01/05/2023        IMPRESSION:   Large loculated right pleural effusion noticed on chest x-ray obtained on 12/31/2022, and CT of the chest obtained on 01/01/2023..  Empyema versus parapneumonic effusion.  Elevated procalcitonin level.  Negative acute respiratory viral pathogen panel and urine antigen.Sputum culture obtained on 12/31/2022 demonstrated growth consistent with beta-lactamase positive moderate haemophilus influenza.  Pleural fluid cytology negative for malignancy  S/p CT-guided right pigtail catheter placement on 01/01/2023.   Exudative pleural effusion by protein criteria.  S/p DNase/tPA through right chest tube every 12 hours for total six doses started on  01/02/2023, resulting interval improvement in chest tube output, repeat chest x-ray obtained on 01/04/2023 demonstrated improving right lung opacity.  Chest tube removed on 01/04/2023.    Acute on chronic systolic congestive heart failure.  Last echo obtained in October 2021 demonstrated LVEF around 45 to 50%.  Sepsis likely from pulmonary origin.  Hypertensive history present on hospitalization.  Prolonged QTc.  Suspected chronic kidney disease with baseline creatinine around 1.2-1.4.  NIDDM type II, last HbA1c 6.3 in 2021.  Morbid obesity.  Medical noncompliance  Leukocytosis,stable       PLAN:   Currently on room air.  May consider supplemental oxygen if necessary.  Target oxygen saturation greater than 88%.  Obtain hallway oximetry before discharge  Continue p.o. levofloxacin.  Follow ID recommendations  Follow pleural fluid culture  Continue Lasix 40 mg IV twice a day.  Follow ins and out.  Follow renal function.  Avoid nephrotoxic agent.  Continue carvedilol 6.25 mg twice a day.  May need dose adjustment.  DVT prophylaxis: Continue subcu heparin  PCCM will sign off.  Follow-up with [facility] in 4 weeks     Subjective/Interval History:         Review of Systems   All other systems reviewed and are negative.        Objective:   Vital Signs:    BP (!) 141/92   Pulse 95   Temp 96.8 F (36 C) (Temporal)   Resp 18   Ht 1.803 m (5\' 11" )   Wt (!) 154.3 kg (340 lb 2.7 oz)   SpO2 97%   BMI 47.44 kg/m             Temp (24hrs), Avg:96.9 F (36.1 C), Min:96.3 F (35.7 C), Max:97.7 F (36.5 C)       Intake/Output:   Last shift:      No intake/output data recorded.  Last 3 shifts: 05/29 1901 - 05/31 0700  In: 660.8 [P.O.:360]  Out: 700 [Urine:500]    Intake/Output Summary (Last 24 hours) at 01/05/2023 0900  Last data filed at 01/04/2023 2130  Gross per 24 hour   Intake 120 ml   Output 500 ml   Net -380 ml          Physical Exam  Vitals and nursing note reviewed.   Eyes:      Pupils: Pupils are equal, round, and reactive to light.   Cardiovascular:      Rate and Rhythm: Regular rhythm.   Pulmonary:      Comments: Reduced air entry on right side     Skin:     General: Skin is warm and dry.   Neurological:      General: No focal deficit present.  Mental Status: He is alert and oriented to person, place, and time.             DATA:  Labs:  Recent Labs     01/03/23  0442 01/04/23  0411   WBC 26.9* 27.2*   HGB 13.7 14.0   HCT 40.9 44.1   PLT 417 457*       Recent Labs     01/03/23  0442 01/04/23  0411   NA 135* 135*   K 3.7 4.0   CL 101 99   CO2 27 30   BUN 32* 25*   MG 1.9 1.9       No results for input(s): "PH", "PCO2", "PO2", "HCO3", "FIO2" in the last 72 hours.         Results       Procedure Component Value Units Date/Time    Culture, Body Fluid [[id number]] Collected: 01/01/23 1440    Order Status: Completed Specimen: Body fld Updated: 01/04/23 1055     Gram Stain Result Moderate WBC'S  No Organisms Seen        Culture Result No Growth at 3 days       Culture, Respiratory [[id number]]  (Abnormal) Collected: 12/31/22 1907    Order Status: Completed Specimen: RESPIRATORY Updated: 01/04/23 1237      Gram Stain Result       <10 Epithelial cells/lpf  >25 WBC's/lpf  Rare Gram Positive Cocci  Rare Gram Variable Bacilli       Culture Result       Moderate Commensal Flora Isolated           ISOLATE --        Moderate  Haemophilus influenzae  Beta-Lactamase Positive      Culture, Urine [[id number]]  (Abnormal) Collected: 12/31/22 1730    Order Status: Completed Specimen: Urine Updated: 01/02/23 0831     Culture Result --        60,000 CFU/mL  Skin and/or genital contamination      Legionella antigen, urine [[id number]] Collected: 12/31/22 1656    Order Status: Completed Specimen: Urine Updated: 12/31/22 1746     Legionella Urinary Ag NEGATIVE        Comment: Presumptive negative for L. pneumophila serogroup 1 antigen in urine, suggesting no recent or  current infection. Infection due to Legionella cannot be ruled out since other serogroups and  species may cause disease, antigen may not be present in urine in early infection and the level  of antigen present in the urine may be below the detection limit of the test.           Strep Pneumoniae Antigen [[id number]] Collected: 12/31/22 1656    Order Status: Completed Specimen: Urine Updated: 12/31/22 1746     STREP PNEUMONIAE ANTIGEN, URINE NEGATIVE        Comment: Presumptive negative for pneumococcal pneumonia, suggesting no current or recent pneumococcal  infection.  Infection due to S. pneumoniae cannot be ruled out since the antigen present in the  sample may be below the detection limit of the test.         Blood Culture 1 [[id number]] Collected: 12/31/22 1637    Order Status: Completed Specimen: Blood Updated: 01/05/23 0734     BLOOD CULTURE RESULT No Growth at 4 days       Blood Culture 2 [[id number]] Collected: 12/31/22 1637    Order Status: Completed Specimen: Blood Updated: 01/05/23 0734     BLOOD CULTURE RESULT No Growth at  4 days       Respiratory Pathogen Panel [[id number]] Collected: 12/31/22 1545    Order Status: Completed Specimen: Nasopharyngeal Swab  Updated: 12/31/22 1940     Adenovirus NOT DETECTED        Coronavirus 299E NOT DETECTED        Coronavirus HKU1 NOT DETECTED        Coronavirus NL63 NOT DETECTED        Coronavirus OC43 NOT DETECTED        Human Metapneumovirus NOT DETECTED        INFLUENZA A H1 NOT DETECTED        Influenza H1N1 NOT DETECTED        Influenza A PCR NOT DETECTED        Influenza B PCR NOT DETECTED        Influenza A H3 NOT DETECTED        Parainfluenza 1 NOT DETECTED        Parainfluenza 2 NOT DETECTED        Parainfluenza 3 NOT DETECTED        Parainfluenza NOT DETECTED        Rhinovirus/Enterovirus NOT DETECTED        RSV PCR NOT DETECTED        Bordetella Pertussis NOT DETECTED        Chlamydophilia pneumoniae NOT DETECTED        Mycoplasma pneumoniae NOT DETECTED        SARS-CoV-2 NOT DETECTED        Comment: A Not Detected (negative) test result for this test means that SARS- CoV-2 RNA was not present  in the specimen above the limit of detection. However, a negative result does not rule out the  possibility of COVID-19 and should not be used as the sole basis for treatment or patient  management decisions.   This test for SARS-CoV2 has been authorized by FDA under an Emergency Use Authorization (EUA).         Rapid Strep Screen [[id number]] Collected: 12/31/22 1545    Order Status: Completed Specimen: Throat Updated: 12/31/22 1747     Strep A Ag       Negative - No Streptococcus Group A DNA was Detected.           Comment: NEGATIVE Streptococcus group A PCR do NOT REFLEX to culture since PCR is very sensitive (equal  to culture for Strep. group A), however, if pharyngitis is suspected by another beta hemolytic  Streptococcus group then you must order a throat culture. Call microbiology ext [phone] if culture  is needed to be added on for testing.         MRSA by PCR [[id number]] Collected: 12/31/22 1545    Order Status: Completed Specimen: Nares Updated: 12/31/22 1852     MRSA PCR screen NEGATIVE                 Imaging:   CT  Result (most recent):  CT CHEST W CONTRAST 01/02/2023    Narrative  EXAM: CT CHEST W CONTRAST    INDICATION: Evaluate residual right pleural effusion for possible lytic  therapy..    TECHNIQUE: CT scan of the chest with IV contrast including coronal and sagittal  images.    All CT exams at this facility use one or more dose reduction techniques  including automatic exposure control, mA/kV adjustment per patient's size, or  iterative reconstruction technique.    COMPARISON:  01/01/2023    FINDINGS:  Pulmonary arteries: Unremarkable. No pulmonary embolism.  Aorta: No acute findings. No thoracic aortic aneurysm or dissection.  Lungs: Redemonstrated moderate-sized loculated right pleural effusion with  associated atelectasis. Indwelling right chest tube.  Lymph nodes: Unremarkable. No enlarged lymph nodes.  Esophagus: Normal  Pleural spaces: Unremarkable. No significant effusion. No pneumothorax.  Heart: Unremarkable. No cardiomegaly. No significant pericardial effusion. No  evidence of right ventricular dysfunction.  Thyroid: Normal  Chest wall: No abnormal findings.  Bones: No fractures identified.    Upper abdomen: Normal    Impression  IMPRESSION:    1. Moderate-sized loculated right pleural effusion with atelectasis.  2. Right chest tube.    Electronically signed by: Cheryln Manly, MD 01/02/2023 12:09 PM EDT  Workstation ID: [id number]            Verlan Friends, MD

## 2023-01-05 NOTE — Progress Notes (Addendum)
Medical Progress Note      NAME: Thomas Browning   DOB:  19-Jan-1966  MRN:             [id number]    Date:  01/05/2023        Assessment:     Large loculated right pleural effusion  Acute hypoxic respiratory failure  Acute on chronic systolic, diastolic  Sepsis, suspect respiratory source  Hypertensive urgency  Prolonged QTc  DM 2,   Morbid obesity    Plan:     Continue to monitor respiratory status  Continue antibiotics with levofloxacin to complete course of treatment  Persistent leukocytosis, 27.2 today  Ambulate patient with PT and OT  Monitor renal function and electrolytes, replete as needed  Monitor blood glucose and continue insulin per Glucomander  Bowel Regimen for constipation  Patient has full CODE STATUS  DVT prophylaxis    Subjective:     Afebrile, no chest pain    Objective:     Vitals:    Last 24hrs VS reviewed since prior progress note. Most recent are:  Vitals:    01/05/23 1126   BP: 127/82   Pulse: 63   Resp: 20   Temp: (!) 96.6 F (35.9 C)   SpO2: 97%     SpO2 Readings from Last 6 Encounters:   01/05/23 97%          Intake/Output Summary (Last 24 hours) at 01/05/2023 1305  Last data filed at 01/05/2023 1129  Gross per 24 hour   Intake 120 ml   Output 1050 ml   Net -930 ml        Exam:     General:   Not in acute distress  HEENT: PERRLA, Neck Supple,  No JVD  Respiratory:   CTA bilaterally-no wheezes, rales, rhonchi, or crackles  Cardiac:  Regular Rate and Rythmn  - no murmurs, rubs or gallops  Abdominal:  Soft, non-tender, non-distended, positive bowel sounds  Extremities:  No cyanosis, or edema.  Skin: No rash  Neurological:  No focal neurological deficits    Medication:   Current Medications Reviewed    Current Facility-Administered Medications:     levoFLOXacin (LEVAQUIN) tablet 750 mg, 750 mg, Oral, Daily, Romulo, Rodrigo L, MD, 750 mg at 01/05/23 4540    morphine (PF) injection 2 mg, 2 mg, IntraVENous, Q4H PRN, Theodis Aguas, MD, 2 mg at 01/04/23 2132    heparin (porcine) injection 5,000 Units,  5,000 Units, SubCUTAneous, BID, Verlan Friends, MD, 5,000 Units at 01/05/23 9811    sodium chloride flush 0.9 % injection 10 mL, 10 mL, IntraVENous, PRN, Williams Che, Mohsin, MD, 10 mL at 01/02/23 0930    oxyCODONE (ROXICODONE) immediate release tablet 5 mg, 5 mg, Oral, Q4H PRN, Swiderski, Iris J, PA-C, 5 mg at 01/05/23 0818    bisacodyl (DULCOLAX) suppository 10 mg, 10 mg, Rectal, Daily PRN, Theodis Aguas, MD    losartan (COZAAR) tablet 25 mg, 25 mg, Oral, Daily, McCray, Robert D, MD, 25 mg at 01/05/23 0818    docusate sodium (COLACE) capsule 100 mg, 100 mg, Oral, BID, Odelia Gage III, MD, 100 mg at 01/05/23 0818    benzonatate (TESSALON) capsule 100 mg, 100 mg, Oral, TID PRN, Hutchinson, Anne, DO, 100 mg at 01/04/23 2133    albuterol (ACCUNEB) nebulizer solution 1.25 mg, 1.25 mg, Nebulization, Q6H PRN, Hutchinson, Anne, DO    nystatin (MYCOSTATIN) powder, , Topical, BID, Hutchinson, Anne, DO, Given at 01/05/23 1209    senna (SENOKOT)  tablet 8.6 mg, 1 tablet, Oral, Nightly, Hutchinson, Anne, DO, 8.6 mg at 01/01/23 2246    polyethylene glycol (GLYCOLAX) packet 17 g, 17 g, Oral, Daily, Hutchinson, Anne, DO, 17 g at 01/05/23 0818    furosemide (LASIX) injection 40 mg, 40 mg, IntraVENous, BID, Hutchinson, Anne, DO, 40 mg at 01/05/23 0818    potassium chloride (KLOR-CON M) extended release tablet 20 mEq, 20 mEq, Oral, Daily, Hutchinson, Anne, DO, 20 mEq at 01/05/23 0818    carvedilol (COREG) tablet 6.25 mg, 6.25 mg, Oral, BID WC, Hutchinson, Anne, DO, 6.25 mg at 01/05/23 0818    sodium chloride flush 0.9 % injection 5-40 mL, 5-40 mL, IntraVENous, 2 times per day, Hutchinson, Anne, DO, 10 mL at 01/05/23 0981    sodium chloride flush 0.9 % injection 5-40 mL, 5-40 mL, IntraVENous, PRN, Hutchinson, Anne, DO    0.9 % sodium chloride infusion, , IntraVENous, PRN, Hutchinson, Anne, DO    acetaminophen (TYLENOL) tablet 650 mg, 650 mg, Oral, Q6H PRN, 650 mg at 01/03/23 1245 **OR** acetaminophen (TYLENOL) suppository 650  mg, 650 mg, Rectal, Q6H PRN, Hutchinson, Anne, DO    lidocaine 4 % external patch 1 patch, 1 patch, TransDERmal, Daily, Hutchinson, Anne, DO, 1 patch at 01/05/23 0818      Lab:     Lab Data Reviewed: (see below)  No results found for this or any previous visit (from the past 24 hour(s)).      @[id number] @    Principal Problem:    Pleural effusion on right  Active Problems:    SOB (shortness of breath)  Resolved Problems:    * No resolved hospital problems. *    ____________________________________________________________________      Attending Physician: Theodis Aguas, MD       Dragon medical dictation software was used for portions of this report. Unintended voice recognition errors may occur.

## 2023-01-05 NOTE — Progress Notes (Addendum)
[facility]   (A Division of [facility])    Follow up Note      Date of Admission: 12/31/2022    Date of Note: 01/05/23        Subjective / Interval History:  Tolerated po Levofloxacin today.  No new complaints           Current Antimicrobials:    Prior Antimicrobials:  Levofloxacin 5/31 - 1  Doxycycline 5/26 - 1  Vanc 5/26 - 1 Piptazo 5/26 - 3 Ceftriaxone 5/29 - 1          Assessment:  Rec / Plan:   New cardiomyopathy  - LVEF 39%.   - Cardiology plans ischemic work-up -> per Cardiology   Acute HFrEF    Right Pleural effusion, parapneumonic  - two weeks worsening cough, some mild chills, sweats, no fever, + hoarseness  - WBC count 34.5  - CXR 5/26:  mod to large R loculated pleural effusion w/ atelectasis in underlying lung  - spcx 5/26 Haemophilus influenzae  - Urag neg Legionella, S pneumo  - refused CT placement in ER 5/26  - s/p 5/27 CT guided R chest tube placement - 300 cc dark yellow, mildly bloody fluid. 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  He has chronic "allergic" symptoms of rhinorrhea, cough that he uses OTC allergy medications for.  Around 2 weeks PTA he noticed worsening of his cough with shortness of breath.  Cough was productive of light to dark-brown sputum, occasionally blood-tinged.  Had no fever but had occasional mild chills and non-drenching sweats. Developed hoarseness which worsened around one week prior to admission and dyspnea worsened leading to his presentation at ER 5/26.  Afebrile but WBC count was 34.5 and CXR  showed a moderated to large R loculated pleural effusion w/ atelectasis in underlying lung.  Left lung is clear.  Acute Respiratory Pathogen Panel was negative. Seen by Dr. Effie Shy of Cardiothoracic Surgery who offered Chest tube placement in ED but the patient declined so CT guided Chest tube placement was scheduled.  Vancomycin, Piperacillin-tazobactam and Doxycycline started 5/26.     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  He has no known exposure to TB, does not keep pet birds, is not exposed to poultry or other livestock.  He does not smoke cigarettes or vape but admits to occasional "weed".  Denies IV drug use       Current Facility-Administered Medications   Medication Dose Route Frequency Provider Last Rate Last Admin    levoFLOXacin (LEVAQUIN) tablet 750 mg  750 mg Oral Daily Nyla Creason L, MD   750 mg at 01/05/23 1610    morphine (PF) injection 2 mg  2 mg IntraVENous Q4H PRN Theodis Aguas, MD   2 mg at 01/05/23 1628    heparin (porcine) injection 5,000 Units  5,000 Units SubCUTAneous BID Verlan Friends, MD  5,000 Units at 01/05/23 0817    sodium chloride flush 0.9 % injection 10 mL  10 mL IntraVENous PRN Berton Woodlawn, MD   10 mL at 01/02/23 0930    bisacodyl (DULCOLAX) suppository 10 mg  10 mg Rectal Daily PRN Theodis Aguas, MD        losartan (COZAAR) tablet 25 mg  25 mg Oral Daily Burgess Estelle, MD   25 mg at 01/05/23 0818    docusate sodium (COLACE) capsule 100 mg  100 mg Oral BID Odelia Gage III, MD   100 mg at 01/05/23 0818    benzonatate (TESSALON) capsule 100 mg  100 mg Oral TID PRN Richarda Overlie, DO   100 mg at 01/05/23 1628    albuterol (ACCUNEB) nebulizer solution 1.25 mg  1.25 mg Nebulization Q6H PRN Richarda Overlie, DO        nystatin (MYCOSTATIN) powder   Topical BID Richarda Overlie, DO   Given at 01/05/23 1209    senna (SENOKOT) tablet 8.6 mg  1 tablet Oral Nightly Richarda Overlie, DO   8.6 mg at 01/01/23 2246    polyethylene  glycol (GLYCOLAX) packet 17 g  17 g Oral Daily Richarda Overlie, DO   17 g at 01/05/23 0818    furosemide (LASIX) injection 40 mg  40 mg IntraVENous BID Hutchinson, Anne, DO   40 mg at 01/05/23 0818    potassium chloride (KLOR-CON M) extended release tablet 20 mEq  20 mEq Oral Daily Richarda Overlie, DO   20 mEq at 01/05/23 0818    carvedilol (COREG) tablet 6.25 mg  6.25 mg Oral BID WC Hutchinson, Anne, DO   6.25 mg at 01/05/23 1628    sodium chloride flush 0.9 % injection 5-40 mL  5-40 mL IntraVENous 2 times per day Richarda Overlie, DO   10 mL at 01/05/23 1914    sodium chloride flush 0.9 % injection 5-40 mL  5-40 mL IntraVENous PRN Hutchinson, Anne, DO        0.9 % sodium chloride infusion   IntraVENous PRN Richarda Overlie, DO        acetaminophen (TYLENOL) tablet 650 mg  650 mg Oral Q6H PRN Richarda Overlie, DO   650 mg at 01/03/23 1245    Or    acetaminophen (TYLENOL) suppository 650 mg  650 mg Rectal Q6H PRN Hutchinson, Anne, DO        lidocaine 4 % external patch 1 patch  1 patch TransDERmal Daily Hutchinson, Anne, DO   1 patch at 01/05/23 0818          ROS:  Negative except as in interval history      Objective:    Vitals:    01/05/23 1627   BP: 124/88   Pulse: 72   Resp: 20   Temp: 97.2 F (36.2 C)   SpO2: 97%          Physical Exam   Constitutional:       General: He is not in acute distress.     Appearance: He is obese. He is not toxic-appearing.   HENT:      Head: Normocephalic and atraumatic.      Nose: Nose normal.      Mouth/Throat:      Pharynx: Oropharynx is clear.   Eyes:      Extraocular Movements: Extraocular movements intact.      Conjunctiva/sclera: Conjunctivae normal.   Cardiovascular:      Rate and Rhythm: Regular rhythm.  Heart sounds: No murmur heard.     No gallop.   Pulmonary:      Comments: improved breath sounds right base, egophony right midlung.  few crackles R midlung no wheezes Chest tube out  Abdominal:      Palpations: Abdomen is soft.      Tenderness: There is no abdominal  tenderness.   Musculoskeletal:      Cervical back: Neck supple.      Right lower leg: No edema.      Left lower leg: No edema.   Skin:     Findings: No rash.   Neurological:      Mental Status: He is alert and oriented to person, place, and time.   Psychiatric:         Mood and Affect: Mood normal.         Behavior: Behavior normal.     Recent Labs     01/04/23  0411 01/03/23  0442 01/01/23  0030   WBC 27.2* 26.9* 36.2*   HGB 14.0 13.7 14.4   HCT 44.1 40.9 44.6   MCV 89.5 87.6 87.3   PLT 457* 417 444          Recent Labs     01/04/23  0411 01/03/23  0442 01/01/23  0030   BUN 25* 32* 27*   CREATININE 1.26 1.21 1.26         Results       Procedure Component Value Units Date/Time    Culture, Body Fluid [[id number]] Collected: 01/01/23 1440    Order Status: Completed Specimen: Body fld Updated: 01/05/23 1047     Gram Stain Result Moderate WBC'S  No Organisms Seen        Culture Result No Growth at 4 days       Culture, Respiratory [[id number]]  (Abnormal) Collected: 12/31/22 1907    Order Status: Completed Specimen: RESPIRATORY Updated: 01/04/23 1237     Gram Stain Result       <10 Epithelial cells/lpf  >25 WBC's/lpf  Rare Gram Positive Cocci  Rare Gram Variable Bacilli       Culture Result       Moderate Commensal Flora Isolated           ISOLATE --        Moderate  Haemophilus influenzae  Beta-Lactamase Positive      Culture, Urine [[id number]]  (Abnormal) Collected: 12/31/22 1730    Order Status: Completed Specimen: Urine Updated: 01/02/23 0831     Culture Result --        60,000 CFU/mL  Skin and/or genital contamination      Legionella antigen, urine [[id number]] Collected: 12/31/22 1656    Order Status: Completed Specimen: Urine Updated: 12/31/22 1746     Legionella Urinary Ag NEGATIVE        Comment: Presumptive negative for L. pneumophila serogroup 1 antigen in urine, suggesting no recent or  current infection. Infection due to Legionella cannot be ruled out since other serogroups and  species may cause disease,  antigen may not be present in urine in early infection and the level  of antigen present in the urine may be below the detection limit of the test.           Strep Pneumoniae Antigen [[id number]] Collected: 12/31/22 1656    Order Status: Completed Specimen: Urine Updated: 12/31/22 1746     STREP PNEUMONIAE ANTIGEN, URINE NEGATIVE        Comment: Presumptive negative for pneumococcal pneumonia,  suggesting no current or recent pneumococcal  infection.  Infection due to S. pneumoniae cannot be ruled out since the antigen present in the  sample may be below the detection limit of the test.         Blood Culture 1 [[id number]] Collected: 12/31/22 1637    Order Status: Completed Specimen: Blood Updated: 01/05/23 0734     BLOOD CULTURE RESULT No Growth at 4 days       Blood Culture 2 [[id number]] Collected: 12/31/22 1637    Order Status: Completed Specimen: Blood Updated: 01/05/23 0734     BLOOD CULTURE RESULT No Growth at 4 days       Respiratory Pathogen Panel [[id number]] Collected: 12/31/22 1545    Order Status: Completed Specimen: Nasopharyngeal Swab Updated: 12/31/22 1940     Adenovirus NOT DETECTED        Coronavirus 299E NOT DETECTED        Coronavirus HKU1 NOT DETECTED        Coronavirus NL63 NOT DETECTED        Coronavirus OC43 NOT DETECTED        Human Metapneumovirus NOT DETECTED        INFLUENZA A H1 NOT DETECTED        Influenza H1N1 NOT DETECTED        Influenza A PCR NOT DETECTED        Influenza B PCR NOT DETECTED        Influenza A H3 NOT DETECTED        Parainfluenza 1 NOT DETECTED        Parainfluenza 2 NOT DETECTED        Parainfluenza 3 NOT DETECTED        Parainfluenza NOT DETECTED        Rhinovirus/Enterovirus NOT DETECTED        RSV PCR NOT DETECTED        Bordetella Pertussis NOT DETECTED        Chlamydophilia pneumoniae NOT DETECTED        Mycoplasma pneumoniae NOT DETECTED        SARS-CoV-2 NOT DETECTED        Comment: A Not Detected (negative) test result for this test means that SARS- CoV-2  RNA was not present  in the specimen above the limit of detection. However, a negative result does not rule out the  possibility of COVID-19 and should not be used as the sole basis for treatment or patient  management decisions.   This test for SARS-CoV2 has been authorized by FDA under an Emergency Use Authorization (EUA).         Rapid Strep Screen [[id number]] Collected: 12/31/22 1545    Order Status: Completed Specimen: Throat Updated: 12/31/22 1747     Strep A Ag       Negative - No Streptococcus Group A DNA was Detected.           Comment: NEGATIVE Streptococcus group A PCR do NOT REFLEX to culture since PCR is very sensitive (equal  to culture for Strep. group A), however, if pharyngitis is suspected by another beta hemolytic  Streptococcus group then you must order a throat culture. Call microbiology ext [phone] if culture  is needed to be added on for testing.         MRSA by PCR [[id number]] Collected: 12/31/22 1545    Order Status: Completed Specimen: Nares Updated: 12/31/22 1852     MRSA PCR screen NEGATIVE  Jerilynn Som, MD, FIDSA  [facility]

## 2023-01-05 NOTE — Plan of Care (Signed)
Problem: Safety - Adult  Goal: Free from fall injury  Outcome: Progressing     Problem: Respiratory - Adult  Goal: Achieves optimal ventilation and oxygenation  01/05/2023 0034 by Earley Favor, RN  Outcome: Progressing  01/04/2023 1849 by Herma Mering, RN  Outcome: Progressing     Problem: Cardiovascular - Adult  Goal: Maintains optimal cardiac output and hemodynamic stability  01/05/2023 0034 by Earley Favor, RN  Outcome: Progressing  01/04/2023 1849 by Herma Mering, RN  Outcome: Progressing     Problem: Pain  Goal: Verbalizes/displays adequate comfort level or baseline comfort level  Outcome: Progressing

## 2023-01-06 LAB — CBC WITH AUTO DIFFERENTIAL
Anisocytosis: 2
Atypical Lymphocytes: 1 % — ABNORMAL HIGH (ref 0–0)
Atypical Lymphocytes: 1 % — ABNORMAL HIGH (ref 0–0)
Band Neutrophils: 1 % (ref 0–11)
Basophils: 1 % (ref 0–3)
Eosinophils: 2 % (ref 0–5)
Eosinophils: 5.1 % — ABNORMAL HIGH (ref 0–5)
Hematocrit: 43.6 % (ref 36.0–55.0)
Hematocrit: 43.5 % (ref 36.0–55.0)
Hemoglobin: 14 gm/dl (ref 13.0–18.0)
Hemoglobin: 13.9 gm/dl (ref 13.0–18.0)
Immature Granulocytes %: 17.2 % (ref 0.0–3.0)
Lymphocytes: 9 % — ABNORMAL LOW (ref 28–48)
Lymphocytes: 8.1 % — ABNORMAL LOW (ref 28–48)
MCH: 28.7 pg (ref 23.0–34.6)
MCH: 28 pg (ref 23.0–34.6)
MCHC: 32.1 gm/dl (ref 30.0–36.0)
MCHC: 32 gm/dl (ref 30.0–36.0)
MCV: 89.3 fL (ref 80.0–98.0)
MCV: 87.5 fL (ref 80.0–98.0)
MPV: 9.2 fL (ref 6.0–10.0)
MPV: 9.1 fL (ref 6.0–10.0)
Macrocytes: 2
Metamyelocytes: 6 % — ABNORMAL HIGH (ref 0–0)
Metamyelocytes: 1 % — ABNORMAL HIGH (ref 0–0)
Monocytes: 3 % (ref 1–13)
Monocytes: 2 % (ref 1–13)
Myelocytes: 8 % — ABNORMAL HIGH (ref 0–0)
Myelocytes: 7.1 % — ABNORMAL HIGH (ref 0–0)
Neutrophils Segmented: 70 % — ABNORMAL HIGH (ref 34–64)
Neutrophils Segmented: 73.7 % — ABNORMAL HIGH (ref 34–64)
Nucleated RBCs: 0 (ref 0–0)
Nucleated RBCs: 0 (ref 0–0)
Platelet Appearance: INCREASED
Platelet Appearance: INCREASED
Platelets: 456 10*3/uL — ABNORMAL HIGH (ref 140–450)
Platelets: 455 10*3/uL — ABNORMAL HIGH (ref 140–450)
Poikilocytosis: 2
Promyelocytes: 1 % — ABNORMAL HIGH (ref 0–0)
RBC Morphology: NORMAL
RBC: 4.88 M/uL (ref 3.80–5.70)
RBC: 4.97 M/uL (ref 3.80–5.70)
RDW: 46.3 — ABNORMAL HIGH (ref 35.1–43.9)
RDW: 46.1 — ABNORMAL HIGH (ref 35.1–43.9)
WBC: 18.5 10*3/uL — ABNORMAL HIGH (ref 4.0–11.0)
WBC: 18.9 10*3/uL — ABNORMAL HIGH (ref 4.0–11.0)

## 2023-01-06 LAB — CULTURE, BODY FLUID: Culture Result: NO GROWTH

## 2023-01-06 LAB — BASIC METABOLIC PANEL
Anion Gap: 7 mmol/L (ref 5–15)
BUN: 14 mg/dl (ref 9–23)
CO2: 28 mEq/L (ref 20–31)
Calcium: 8.6 mg/dl — ABNORMAL LOW (ref 8.7–10.4)
Chloride: 99 mEq/L (ref 98–107)
Creatinine: 0.93 mg/dl (ref 0.70–1.30)
GFR African American: >60
GFR Non-African American: >60
Glucose: 109 mg/dl — ABNORMAL HIGH (ref 74–106)
Potassium: 4.1 mEq/L (ref 3.5–5.1)
Sodium: 134 mEq/L — ABNORMAL LOW (ref 136–145)

## 2023-01-06 LAB — CULTURE, BLOOD 2: BLOOD CULTURE RESULT: NO GROWTH

## 2023-01-06 LAB — CULTURE, BLOOD 1: BLOOD CULTURE RESULT: NO GROWTH

## 2023-01-06 LAB — MAGNESIUM: Magnesium: 1.7 mg/dL (ref 1.6–2.6)

## 2023-01-06 MED FILL — CARVEDILOL 6.25 MG PO TABS: 6.25 MG | ORAL | Qty: 1

## 2023-01-06 MED FILL — DOCUSATE SODIUM 100 MG PO CAPS: 100 MG | ORAL | Qty: 1

## 2023-01-06 MED FILL — GERI-KOT 8.6 MG PO TABS: 8.6 MG | ORAL | Qty: 1

## 2023-01-06 MED FILL — SODIUM CHLORIDE FLUSH 0.9 % IV SOLN: 0.9 % | INTRAVENOUS | Qty: 10

## 2023-01-06 MED FILL — GNP LIDOCAINE PAIN RELIEF 4 % EX PTCH: 4 % | CUTANEOUS | Qty: 1

## 2023-01-06 MED FILL — POLYETHYLENE GLYCOL 3350 17 G PO PACK: 17 g | ORAL | Qty: 1

## 2023-01-06 MED FILL — FUROSEMIDE 10 MG/ML IJ SOLN: 10 MG/ML | INTRAMUSCULAR | Qty: 4

## 2023-01-06 MED FILL — HEPARIN SODIUM (PORCINE) 5000 UNIT/ML IJ SOLN: 5000 UNIT/ML | INTRAMUSCULAR | Qty: 1

## 2023-01-06 MED FILL — LOSARTAN POTASSIUM 25 MG PO TABS: 25 MG | ORAL | Qty: 1

## 2023-01-06 MED FILL — MORPHINE SULFATE 2 MG/ML IJ SOLN: 2 mg/mL | INTRAMUSCULAR | Qty: 1

## 2023-01-06 MED FILL — LEVOFLOXACIN 250 MG PO TABS: 250 MG | ORAL | Qty: 3

## 2023-01-06 MED FILL — POTASSIUM CHLORIDE CRYS ER 20 MEQ PO TBCR: 20 MEQ | ORAL | Qty: 1

## 2023-01-06 NOTE — Progress Notes (Signed)
Cardiology Progress Note      Patient: Thomas Browning Age: 57 y.o. Sex: male    Date of Birth: [birth year] Admit Date: 12/31/2022 PCP: Unknown, Provider, APRN - NP   MRN: [id number]  CSN: [id number]        ASSESSMENT AND RECOMMENDATIONS:   57 year old gentleman with morbid obesity who presented with progressively worsening dyspnea for 2 weeks.  He was noted to have leukocytosis, right pleural effusion and elevated BNP.  His subsequent cardiac evaluation notable for cardiomyopathy.     Cardiomyopathy: LVEF 39%, grade 2 diastolic dysfunction.    Etiology of remains unclear.  Will plan for ischemic evaluation with a coronary CTA.  Continue carvedilol 6.25 mg twice daily and losartan 25 mg daily.  Will add MRA and SGLT2 inhibitors as tolerated.     Acute heart failure with reduced ejection fraction (HFrEF):   Continue diuresis with Lasix 40 mg IV push twice daily    3.  Pneumonia/pleural effusion: Empyema versus parapneumonic effusion.  Status post CT-guided right pigtail catheter placement on 01/01/2023.  Defer to pulmonology and the primary team.    4.  Pulmonary hypertension: Moderate pulmonary hypertension with RVSP 47 mmHg.  Likely related to heart failure and pulmonary disease, management of which as detailed above.     INTERIM EVENTS:   Overall feels well.  Denies any chest pain, palpitations or dyspnea.     PHYSICAL EXAM:     VS: BP 113/69   Pulse 87   Temp (!) 96.6 F (35.9 C) (Temporal)   Resp 17   Ht 1.803 m (5\' 11" )   Wt (!) 154.3 kg (340 lb 2.7 oz)   SpO2 96%   BMI 47.44 kg/m   @[id number] @   Body mass index is 47.44 kg/m.     Intake/Output Summary (Last 24 hours) at 01/06/2023 1545  Last data filed at 01/06/2023 1503  Gross per 24 hour   Intake 480 ml   Output --   Net 480 ml       Physical examination:  GEN: alert, orientedx3, in respiratory distress  HEENT: PERRLA, mucous membranes are moist, No jugular venous distention or carotid bruit is noted  PULM: Decreased breath sounds over left lung base, no  wheezing  CARD:  Normal S1/S2, no m/r/g  ABD:  Soft, non-tender, non-distended  NEURO:  No focal deficit, CN II-XII intact  EXT: 1+ edema of the bilateral knees    CARDIOVASCULAR STUDIES:   ECG 12/31/22: NSR 96 bpm, iLBBB, prolonged QTc (485 ms)   TTE 12/25/2022: LVEF 39%, grade 2 diastolic function, moderately dilated right ventricle with normal function, no hemodynamically significant valvular disease    LABS:     Basic Metabolic Profile   Lab Results   Component Value Date/Time    NA 134 (L) 01/06/2023 08:08 AM    NA 135 (L) 01/04/2023 04:11 AM    NA 135 (L) 01/03/2023 04:42 AM    K 4.1 01/06/2023 08:08 AM    K 4.0 01/04/2023 04:11 AM    K 3.7 01/03/2023 04:42 AM    CL 99 01/06/2023 08:08 AM    CL 99 01/04/2023 04:11 AM    CL 101 01/03/2023 04:42 AM    CO2 28 01/06/2023 08:08 AM    CO2 30 01/04/2023 04:11 AM    CO2 27 01/03/2023 04:42 AM    BUN 14 01/06/2023 08:08 AM    BUN 25 (H) 01/04/2023 04:11 AM    BUN 32 (H) 01/03/2023 04:42 AM  CREATININE 0.93 01/06/2023 08:08 AM    CREATININE 1.26 01/04/2023 04:11 AM    CREATININE 1.21 01/03/2023 04:42 AM   .       Lab Results   Component Value Date/Time    WBC 18.9 (H) 01/06/2023 08:08 AM    WBC 18.5 (H) 01/05/2023 09:20 PM    HGB 13.9 01/06/2023 08:08 AM    HGB 14.0 01/05/2023 09:20 PM    HGB 14.0 01/04/2023 04:11 AM    HCT 43.5 01/06/2023 08:08 AM    HCT 43.6 01/05/2023 09:20 PM    HCT 44.1 01/04/2023 04:11 AM    PLT 455 (H) 01/06/2023 08:08 AM    PLT 456 (H) 01/05/2023 09:20 PM         Cardiac Enzymes   No components found for: "3", "TROP"     Coagulation   Lab Results   Component Value Date/Time    INR 1.6 (H) 01/01/2023 12:30 AM    INR 1.7 (H) 12/31/2022 04:54 PM    APTT 34.6 12/31/2022 04:54 PM        Lipids/A1C   Lab Results   Component Value Date    LABA1C 5.8 (H) 12/31/2022       Lab Results   Component Value Date    CHOL 114 12/31/2022    HDL <20 (L) 12/31/2022    CHOLHDLRATIO 6.0 (H) 12/31/2022    TRIG 127 12/31/2022        MEDICATIONS:     Current  Facility-Administered Medications   Medication Dose Route Frequency Provider Last Rate Last Admin    levoFLOXacin (LEVAQUIN) tablet 750 mg  750 mg Oral Daily Romulo, Rodrigo L, MD   750 mg at 01/06/23 1023    morphine (PF) injection 2 mg  2 mg IntraVENous Q4H PRN Theodis Aguas, MD   2 mg at 01/05/23 2105    heparin (porcine) injection 5,000 Units  5,000 Units SubCUTAneous BID Verlan Friends, MD   5,000 Units at 01/06/23 1022    sodium chloride flush 0.9 % injection 10 mL  10 mL IntraVENous PRN Williams Che, Sashia Campas, MD   10 mL at 01/02/23 0930    bisacodyl (DULCOLAX) suppository 10 mg  10 mg Rectal Daily PRN Theodis Aguas, MD        losartan (COZAAR) tablet 25 mg  25 mg Oral Daily Burgess Estelle, MD   25 mg at 01/06/23 1023    docusate sodium (COLACE) capsule 100 mg  100 mg Oral BID Odelia Gage III, MD   100 mg at 01/06/23 1024    benzonatate (TESSALON) capsule 100 mg  100 mg Oral TID PRN Richarda Overlie, DO   100 mg at 01/05/23 1628    albuterol (ACCUNEB) nebulizer solution 1.25 mg  1.25 mg Nebulization Q6H PRN Richarda Overlie, DO        nystatin (MYCOSTATIN) powder   Topical BID Hutchinson, Anne, DO   Given at 01/06/23 1024    senna (SENOKOT) tablet 8.6 mg  1 tablet Oral Nightly Hutchinson, Anne, DO   8.6 mg at 01/01/23 2246    polyethylene glycol (GLYCOLAX) packet 17 g  17 g Oral Daily Hutchinson, Anne, DO   17 g at 01/06/23 1022    furosemide (LASIX) injection 40 mg  40 mg IntraVENous BID Hutchinson, Anne, DO   40 mg at 01/06/23 1022    potassium chloride (KLOR-CON M) extended release tablet 20 mEq  20 mEq Oral Daily Hutchinson, Anne, DO   20 mEq at 01/06/23  1023    carvedilol (COREG) tablet 6.25 mg  6.25 mg Oral BID WC Hutchinson, Anne, DO   6.25 mg at 01/06/23 1024    sodium chloride flush 0.9 % injection 5-40 mL  5-40 mL IntraVENous 2 times per day Hutchinson, Anne, DO   10 mL at 01/06/23 1024    sodium chloride flush 0.9 % injection 5-40 mL  5-40 mL IntraVENous PRN Hutchinson, Anne, DO        0.9  % sodium chloride infusion   IntraVENous PRN Hutchinson, Anne, DO        acetaminophen (TYLENOL) tablet 650 mg  650 mg Oral Q6H PRN Richarda Overlie, DO   650 mg at 01/03/23 1245    Or    acetaminophen (TYLENOL) suppository 650 mg  650 mg Rectal Q6H PRN Hutchinson, Anne, DO        lidocaine 4 % external patch 1 patch  1 patch TransDERmal Daily Hutchinson, Anne, DO   1 patch at 01/06/23 1020     Thank you for involving [place] in Mr. Shipper's care.  We will continue to follow.  Please call with any questions or concerns.    Fremon Zacharia Williams Che, MD, [facility], RPVI, [facility]  Interventional Cardiologist  [facility]  Pager: [phone]

## 2023-01-06 NOTE — Progress Notes (Signed)
Medical Progress Note      NAME: Thomas Browning   DOB:  May 01, 1966  MRN:             [id number]    Date:  01/06/2023        Assessment:     Large loculated right pleural effusion  Acute hypoxic respiratory failure  Acute on chronic systolic, diastolic  Sepsis, suspect respiratory source  Hypertensive urgency  Prolonged QTc  DM 2,   Morbid obesity    Plan:     Cardiology planning coronary CTA on Monday  Continue current medical management  Continue to monitor respiratory status  Continue antibiotics with levofloxacin to complete course of treatment  Monitor CBC  Ambulate patient with PT and OT  Monitor renal function and electrolytes, replete as needed  Monitor blood glucose and continue insulin per Glucomander  Bowel Regimen for constipation  Patient has full CODE STATUS  DVT prophylaxis    Subjective:     Afebrile, no chest pain    Objective:     Vitals:    Last 24hrs VS reviewed since prior progress note. Most recent are:  Vitals:    01/06/23 1145   BP: 113/69   Pulse: 87   Resp: 17   Temp: (!) 96.6 F (35.9 C)   SpO2: 96%     SpO2 Readings from Last 6 Encounters:   01/06/23 96%          Intake/Output Summary (Last 24 hours) at 01/06/2023 1220  Last data filed at 01/06/2023 1019  Gross per 24 hour   Intake 240 ml   Output 500 ml   Net -260 ml          Exam:     General:   Not in acute distress  HEENT: PERRLA, Neck Supple,  No JVD  Respiratory:   CTA bilaterally-no wheezes, rales, rhonchi, or crackles  Cardiac:  Regular Rate and Rythmn  - no murmurs, rubs or gallops  Abdominal:  Soft, non-tender, non-distended, positive bowel sounds  Extremities:  No cyanosis, or edema.  Skin: No rash  Neurological:  No focal neurological deficits    Medication:   Current Medications Reviewed    Current Facility-Administered Medications:     levoFLOXacin (LEVAQUIN) tablet 750 mg, 750 mg, Oral, Daily, Romulo, Rodrigo L, MD, 750 mg at 01/06/23 1023    morphine (PF) injection 2 mg, 2 mg, IntraVENous, Q4H PRN, Theodis Aguas, MD, 2 mg at  01/05/23 2105    heparin (porcine) injection 5,000 Units, 5,000 Units, SubCUTAneous, BID, Verlan Friends, MD, 5,000 Units at 01/06/23 1022    sodium chloride flush 0.9 % injection 10 mL, 10 mL, IntraVENous, PRN, Williams Che, Mohsin, MD, 10 mL at 01/02/23 0930    bisacodyl (DULCOLAX) suppository 10 mg, 10 mg, Rectal, Daily PRN, Theodis Aguas, MD    losartan (COZAAR) tablet 25 mg, 25 mg, Oral, Daily, Burgess Estelle, MD, 25 mg at 01/06/23 1023    docusate sodium (COLACE) capsule 100 mg, 100 mg, Oral, BID, Odelia Gage III, MD, 100 mg at 01/06/23 1024    benzonatate (TESSALON) capsule 100 mg, 100 mg, Oral, TID PRN, Hutchinson, Anne, DO, 100 mg at 01/05/23 1628    albuterol (ACCUNEB) nebulizer solution 1.25 mg, 1.25 mg, Nebulization, Q6H PRN, Hutchinson, Anne, DO    nystatin (MYCOSTATIN) powder, , Topical, BID, Hutchinson, Anne, DO, Given at 01/06/23 1024    senna (SENOKOT) tablet 8.6 mg, 1 tablet, Oral, Nightly, Hutchinson, Anne, DO, 8.6 mg  at 01/01/23 2246    polyethylene glycol (GLYCOLAX) packet 17 g, 17 g, Oral, Daily, Hutchinson, Anne, DO, 17 g at 01/06/23 1022    furosemide (LASIX) injection 40 mg, 40 mg, IntraVENous, BID, Hutchinson, Anne, DO, 40 mg at 01/06/23 1022    potassium chloride (KLOR-CON M) extended release tablet 20 mEq, 20 mEq, Oral, Daily, Hutchinson, Anne, DO, 20 mEq at 01/06/23 1023    carvedilol (COREG) tablet 6.25 mg, 6.25 mg, Oral, BID WC, Hutchinson, Anne, DO, 6.25 mg at 01/06/23 1024    sodium chloride flush 0.9 % injection 5-40 mL, 5-40 mL, IntraVENous, 2 times per day, Hutchinson, Anne, DO, 10 mL at 01/06/23 1024    sodium chloride flush 0.9 % injection 5-40 mL, 5-40 mL, IntraVENous, PRN, Hutchinson, Anne, DO    0.9 % sodium chloride infusion, , IntraVENous, PRN, Hutchinson, Anne, DO    acetaminophen (TYLENOL) tablet 650 mg, 650 mg, Oral, Q6H PRN, 650 mg at 01/03/23 1245 **OR** acetaminophen (TYLENOL) suppository 650 mg, 650 mg, Rectal, Q6H PRN, Hutchinson, Anne, DO    lidocaine 4 %  external patch 1 patch, 1 patch, TransDERmal, Daily, Hutchinson, Anne, DO, 1 patch at 01/06/23 1020      Lab:     Lab Data Reviewed: (see below)  Recent Results (from the past 24 hour(s))   CBC with Auto Differential    Collection Time: 01/05/23  9:20 PM   Result Value Ref Range    WBC 18.5 (H) 4.0 - 11.0 1000/mm3    RBC 4.88 3.80 - 5.70 M/uL    Hemoglobin 14.0 13.0 - 18.0 gm/dl    Hematocrit 86.5 78.4 - 55.0 %    MCV 89.3 80.0 - 98.0 fL    MCH 28.7 23.0 - 34.6 pg    MCHC 32.1 30.0 - 36.0 gm/dl    Platelets 696 (H) 295 - 450 1000/mm3    MPV 9.2 6.0 - 10.0 fL    RDW 46.3 (H) 35.1 - 43.9      Nucleated RBCs 0 0 - 0      Immature Granulocytes % 17.2 (HH) 0.0 - 3.0 %    Neutrophils Segmented 70.0 (H) 34 - 64 %    Lymphocytes 9.0 (L) 28 - 48 %    Atypical Lymphocytes 1.0 (H) 0 - 0 %    Monocytes 3.0 1 - 13 %    Eosinophils 2.0 0 - 5 %    Metamyelocytes 6.0 (H) 0 - 0 %    Myelocytes 8.0 (H) 0 - 0 %    Promyelocytes 1.0 (H) 0 - 0 %    RBC Morphology NORMAL      Platelet Appearance INCREASED     Basic Metabolic Panel    Collection Time: 01/06/23  8:08 AM   Result Value Ref Range    Potassium 4.1 3.5 - 5.1 mEq/L    Chloride 99 98 - 107 mEq/L    Sodium 134 (L) 136 - 145 mEq/L    CO2 28 20 - 31 mEq/L    Glucose 109 (H) 74 - 106 mg/dl    BUN 14 9 - 23 mg/dl    Creatinine 2.84 1.32 - 1.30 mg/dl    GFR African American >60      GFR Non-African American >60      Calcium 8.6 (L) 8.7 - 10.4 mg/dl    Anion Gap 7 5 - 15 mmol/L   CBC with Auto Differential    Collection Time: 01/06/23  8:08 AM   Result  Value Ref Range    WBC 18.9 (H) 4.0 - 11.0 1000/mm3    RBC 4.97 3.80 - 5.70 M/uL    Hemoglobin 13.9 13.0 - 18.0 gm/dl    Hematocrit 16.6 06.3 - 55.0 %    MCV 87.5 80.0 - 98.0 fL    MCH 28.0 23.0 - 34.6 pg    MCHC 32.0 30.0 - 36.0 gm/dl    Platelets 016 (H) 010 - 450 1000/mm3    MPV 9.1 6.0 - 10.0 fL    RDW 46.1 (H) 35.1 - 43.9     Magnesium    Collection Time: 01/06/23  8:08 AM   Result Value Ref Range    Magnesium 1.7 1.6 - 2.6 mg/dL          @[id number] @    Principal Problem:    Pleural effusion on right  Active Problems:    SOB (shortness of breath)  Resolved Problems:    * No resolved hospital problems. *    ____________________________________________________________________      Attending Physician: Theodis Aguas, MD       Dragon medical dictation software was used for portions of this report. Unintended voice recognition errors may occur.

## 2023-01-07 LAB — BASIC METABOLIC PANEL
Anion Gap: 8 mmol/L (ref 5–15)
BUN: 16 mg/dl (ref 9–23)
CO2: 30 mEq/L (ref 20–31)
Calcium: 9.1 mg/dl (ref 8.7–10.4)
Chloride: 97 mEq/L — ABNORMAL LOW (ref 98–107)
Creatinine: 1.11 mg/dl (ref 0.70–1.30)
GFR African American: >60
GFR Non-African American: >60
Glucose: 97 mg/dl (ref 74–106)
Potassium: 3.8 mEq/L (ref 3.5–5.1)
Sodium: 134 mEq/L — ABNORMAL LOW (ref 136–145)

## 2023-01-07 LAB — CBC WITH AUTO DIFFERENTIAL
Anisocytosis: 2
Atypical Lymphocytes: 4 % — ABNORMAL HIGH (ref 0–0)
Eosinophils: 3 % (ref 0–5)
Hematocrit: 41.8 % (ref 36.0–55.0)
Hemoglobin: 13.5 gm/dl (ref 13.0–18.0)
Lymphocytes: 7 % — ABNORMAL LOW (ref 28–48)
MCH: 28.3 pg (ref 23.0–34.6)
MCHC: 32.3 gm/dl (ref 30.0–36.0)
MCV: 87.6 fL (ref 80.0–98.0)
MPV: 9.3 fL (ref 6.0–10.0)
Macrocytes: 2
Monocytes: 3 % (ref 1–13)
Myelocytes: 7 % — ABNORMAL HIGH (ref 0–0)
Neutrophils Segmented: 76 % — ABNORMAL HIGH (ref 34–64)
Nucleated RBCs: 0 (ref 0–0)
Platelet Appearance: INCREASED
Platelets: 474 10*3/uL — ABNORMAL HIGH (ref 140–450)
RBC: 4.77 M/uL (ref 3.80–5.70)
RDW: 45.8 — ABNORMAL HIGH (ref 35.1–43.9)
WBC: 17.2 10*3/uL — ABNORMAL HIGH (ref 4.0–11.0)

## 2023-01-07 LAB — MAGNESIUM: Magnesium: 1.8 mg/dL (ref 1.6–2.6)

## 2023-01-07 MED ORDER — FUROSEMIDE 40 MG PO TABS
40 | Freq: Two times a day (BID) | ORAL | Status: DC
Start: 2023-01-07 — End: 2023-01-08
  Administered 2023-01-07 – 2023-01-08 (×2): 40 mg via ORAL

## 2023-01-07 MED FILL — MORPHINE SULFATE 2 MG/ML IJ SOLN: 2 mg/mL | INTRAMUSCULAR | Qty: 1

## 2023-01-07 MED FILL — POLYETHYLENE GLYCOL 3350 17 G PO PACK: 17 g | ORAL | Qty: 1

## 2023-01-07 MED FILL — CARVEDILOL 6.25 MG PO TABS: 6.25 MG | ORAL | Qty: 1

## 2023-01-07 MED FILL — POTASSIUM CHLORIDE CRYS ER 20 MEQ PO TBCR: 20 MEQ | ORAL | Qty: 1

## 2023-01-07 MED FILL — HEPARIN SODIUM (PORCINE) 5000 UNIT/ML IJ SOLN: 5000 UNIT/ML | INTRAMUSCULAR | Qty: 1

## 2023-01-07 MED FILL — LOSARTAN POTASSIUM 25 MG PO TABS: 25 MG | ORAL | Qty: 1

## 2023-01-07 MED FILL — FUROSEMIDE 10 MG/ML IJ SOLN: 10 MG/ML | INTRAMUSCULAR | Qty: 4

## 2023-01-07 MED FILL — FUROSEMIDE 40 MG PO TABS: 40 MG | ORAL | Qty: 1

## 2023-01-07 MED FILL — DOCUSATE SODIUM 100 MG PO CAPS: 100 MG | ORAL | Qty: 1

## 2023-01-07 MED FILL — GNP LIDOCAINE PAIN RELIEF 4 % EX PTCH: 4 % | CUTANEOUS | Qty: 1

## 2023-01-07 MED FILL — SODIUM CHLORIDE FLUSH 0.9 % IV SOLN: 0.9 % | INTRAVENOUS | Qty: 10

## 2023-01-07 MED FILL — LEVOFLOXACIN 250 MG PO TABS: 250 MG | ORAL | Qty: 3

## 2023-01-07 MED FILL — GERI-KOT 8.6 MG PO TABS: 8.6 MG | ORAL | Qty: 1

## 2023-01-07 NOTE — Progress Notes (Signed)
Patient requests not be to awaken for 12 am vital signs. Writer communicated patient request with Jasmin, NCP.

## 2023-01-07 NOTE — Progress Notes (Signed)
Brief Nutrition Note (Length of Stay):    Pt was identified by registered dietitian (RD) as being at nutritional risk related to length of stay. Pt with another provider at time of attempted visit. Currently on a 4CCHO diet with documented PO intake by nursing of 76-100% of meals this admission. No noted N/V, abd pain, constipation, diarrhea; denies chewing/swallowing issues with current diet order; NKFA. In-house intake has been adequate and/or improving.     Upon review by the RD, there is no indication that this pt is at nutritional risk or requires further nutrition intervention at this time. Will re-evaluate per protocols.   Thank you.    Current Diet: ADULT DIET; Regular; 4 carb choices (60 gm/meal)       Thomas Browning, RD, CNSC  01/07/23   Office: ext [phone]  On-Call: [phone]

## 2023-01-07 NOTE — Progress Notes (Signed)
Medical Progress Note      NAME: Thomas Browning   DOB:  12-Jul-1966  MRN:             [id number]    Date:  01/07/2023        Assessment:     Large loculated right pleural effusion  Acute hypoxic respiratory failure  Acute on chronic systolic, diastolic  Sepsis, suspect respiratory source  Hypertensive urgency  Prolonged QTc  DM 2,   Morbid obesity    Plan:     Awaiting coronary CTA tomorrow  Continue current medical management  Continue to monitor respiratory status  Continue antibiotics with levofloxacin to complete course of treatment  Monitor CBC  Ambulate patient with PT and OT  Monitor renal function and electrolytes, replete as needed  Monitor blood glucose and continue insulin per Glucomander  Bowel Regimen for constipation  Patient has full CODE STATUS  DVT prophylaxis    Subjective:     Afebrile, no chest pain    Objective:     Vitals:    Last 24hrs VS reviewed since prior progress note. Most recent are:  Vitals:    01/07/23 1231   BP: 116/81   Pulse: 74   Resp: 16   Temp: 97.2 F (36.2 C)   SpO2:      SpO2 Readings from Last 6 Encounters:   01/07/23 96%          Intake/Output Summary (Last 24 hours) at 01/07/2023 1412  Last data filed at 01/07/2023 0517  Gross per 24 hour   Intake 720 ml   Output 400 ml   Net 320 ml          Exam:     General:   Not in acute distress  HEENT: PERRLA, Neck Supple,  No JVD  Respiratory:   CTA bilaterally-no wheezes, rales, rhonchi, or crackles  Cardiac:  Regular Rate and Rythmn  - no murmurs, rubs or gallops  Abdominal:  Soft, non-tender, non-distended, positive bowel sounds  Extremities:  No cyanosis, or edema.  Skin: No rash  Neurological:  No focal neurological deficits    Medication:   Current Medications Reviewed    Current Facility-Administered Medications:     levoFLOXacin (LEVAQUIN) tablet 750 mg, 750 mg, Oral, Daily, Romulo, Rodrigo L, MD, 750 mg at 01/07/23 1016    morphine (PF) injection 2 mg, 2 mg, IntraVENous, Q4H PRN, Theodis Aguas, MD, 2 mg at 01/07/23 0651    heparin  (porcine) injection 5,000 Units, 5,000 Units, SubCUTAneous, BID, Verlan Friends, MD, 5,000 Units at 01/07/23 1016    sodium chloride flush 0.9 % injection 10 mL, 10 mL, IntraVENous, PRN, Williams Che, Mohsin, MD, 10 mL at 01/02/23 0930    bisacodyl (DULCOLAX) suppository 10 mg, 10 mg, Rectal, Daily PRN, Theodis Aguas, MD    losartan (COZAAR) tablet 25 mg, 25 mg, Oral, Daily, Burgess Estelle, MD, 25 mg at 01/07/23 1015    docusate sodium (COLACE) capsule 100 mg, 100 mg, Oral, BID, Odelia Gage III, MD, 100 mg at 01/07/23 1015    benzonatate (TESSALON) capsule 100 mg, 100 mg, Oral, TID PRN, Hutchinson, Anne, DO, 100 mg at 01/05/23 1628    albuterol (ACCUNEB) nebulizer solution 1.25 mg, 1.25 mg, Nebulization, Q6H PRN, Hutchinson, Anne, DO    nystatin (MYCOSTATIN) powder, , Topical, BID, Hutchinson, Anne, DO, Given at 01/07/23 1018    senna (SENOKOT) tablet 8.6 mg, 1 tablet, Oral, Nightly, Hutchinson, Anne, DO, 8.6 mg at 01/01/23 2246  polyethylene glycol (GLYCOLAX) packet 17 g, 17 g, Oral, Daily, Hutchinson, Anne, DO, 17 g at 01/06/23 1022    furosemide (LASIX) injection 40 mg, 40 mg, IntraVENous, BID, Hutchinson, Anne, DO, 40 mg at 01/07/23 1016    potassium chloride (KLOR-CON M) extended release tablet 20 mEq, 20 mEq, Oral, Daily, Hutchinson, Anne, DO, 20 mEq at 01/07/23 1016    carvedilol (COREG) tablet 6.25 mg, 6.25 mg, Oral, BID WC, Hutchinson, Anne, DO, 6.25 mg at 01/07/23 1016    sodium chloride flush 0.9 % injection 5-40 mL, 5-40 mL, IntraVENous, 2 times per day, Hutchinson, Anne, DO, 5 mL at 01/07/23 1051    sodium chloride flush 0.9 % injection 5-40 mL, 5-40 mL, IntraVENous, PRN, Hutchinson, Anne, DO    0.9 % sodium chloride infusion, , IntraVENous, PRN, Hutchinson, Anne, DO    acetaminophen (TYLENOL) tablet 650 mg, 650 mg, Oral, Q6H PRN, 650 mg at 01/03/23 1245 **OR** acetaminophen (TYLENOL) suppository 650 mg, 650 mg, Rectal, Q6H PRN, Hutchinson, Anne, DO    lidocaine 4 % external patch 1 patch, 1  patch, TransDERmal, Daily, Hutchinson, Anne, DO, 1 patch at 01/06/23 1020      Lab:     Lab Data Reviewed: (see below)  Recent Results (from the past 24 hour(s))   CBC with Auto Differential    Collection Time: 01/07/23  6:09 AM   Result Value Ref Range    WBC 17.2 (H) 4.0 - 11.0 1000/mm3    Neutrophils Segmented 76.0 (H) 34 - 64 %    RBC 4.77 3.80 - 5.70 M/uL    Lymphocytes 7.0 (L) 28 - 48 %    Hemoglobin 13.5 13.0 - 18.0 gm/dl    Monocytes 3.0 1 - 13 %    Hematocrit 41.8 36.0 - 55.0 %    Eosinophils 3.0 0 - 5 %    MCV 87.6 80.0 - 98.0 fL    MCH 28.3 23.0 - 34.6 pg    MCHC 32.3 30.0 - 36.0 gm/dl    Platelets 161 (H) 096 - 450 1000/mm3    Myelocytes 7.0 (H) 0 - 0 %    MPV 9.3 6.0 - 10.0 fL    RDW 45.8 (H) 35.1 - 43.9      Atypical Lymphocytes 4.0 (H) 0 - 0 %    Nucleated RBCs 0 0 - 0      Anisocytosis 2      Macrocytes 2      Platelet Appearance INCREASED     Basic Metabolic Panel    Collection Time: 01/07/23  6:09 AM   Result Value Ref Range    Potassium 3.8 3.5 - 5.1 mEq/L    Chloride 97 (L) 98 - 107 mEq/L    Sodium 134 (L) 136 - 145 mEq/L    CO2 30 20 - 31 mEq/L    Glucose 97 74 - 106 mg/dl    BUN 16 9 - 23 mg/dl    Creatinine 0.45 4.09 - 1.30 mg/dl    GFR African American >60      GFR Non-African American >60      Calcium 9.1 8.7 - 10.4 mg/dl    Anion Gap 8 5 - 15 mmol/L   Magnesium    Collection Time: 01/07/23  6:09 AM   Result Value Ref Range    Magnesium 1.8 1.6 - 2.6 mg/dL         @[email or web address] @    Principal Problem:    Pleural effusion on right  Active Problems:  SOB (shortness of breath)  Resolved Problems:    * No resolved hospital problems. *    ____________________________________________________________________      Attending Physician: Theodis Aguas, MD       Dragon medical dictation software was used for portions of this report. Unintended voice recognition errors may occur.

## 2023-01-07 NOTE — Plan of Care (Signed)
Problem: Pain  Goal: Verbalizes/displays adequate comfort level or baseline comfort level  01/07/2023 1108 by Synetta Fail, RN  Outcome: Progressing  01/07/2023 0152 by Rogelio Seen, RN  Outcome: Progressing

## 2023-01-07 NOTE — Progress Notes (Signed)
Cardiology Progress Note      Patient: Thomas Browning Age: 57 y.o. Sex: male    Date of Birth: 1966/03/17 Admit Date: 12/31/2022 PCP: Unknown, Provider, APRN - NP   MRN: [id number]  CSN: [id number]        ASSESSMENT AND RECOMMENDATIONS:   57 year old gentleman with morbid obesity who presented with progressively worsening dyspnea for 2 weeks.  He was noted to have leukocytosis, right pleural effusion and elevated BNP.  His subsequent cardiac evaluation notable for cardiomyopathy.     Cardiomyopathy: LVEF 39%, grade 2 diastolic dysfunction.    Etiology of remains unclear.  Will plan for ischemic evaluation with a coronary CTA.  Continue carvedilol 6.25 mg twice daily and losartan 25 mg daily.  Will add MRA and SGLT2 inhibitors as tolerated.     Acute heart failure with reduced ejection fraction (HFrEF):   Appears to be close to euvolemic.    Transition to furosemide 40 mg PO twice daily (from furosemide 40 mg IVP bid)    3.  Pneumonia/pleural effusion: Empyema versus parapneumonic effusion.  Status post CT-guided right pigtail catheter placement on 01/01/2023.  Defer to pulmonology and the primary team.    4.  Pulmonary hypertension: Moderate pulmonary hypertension with RVSP 47 mmHg.  Likely related to heart failure and pulmonary disease, management of which as detailed above.     INTERIM EVENTS:   Overall feels well.  Denies any chest pain, palpitations or dyspnea.     PHYSICAL EXAM:     VS: BP 116/81   Pulse 74   Temp 97.2 F (36.2 C) (Temporal)   Resp 16   Ht 1.803 m (5\' 11" )   Wt (!) 154.3 kg (340 lb 2.7 oz)   SpO2 96%   BMI 47.44 kg/m   @[id number] @   Body mass index is 47.44 kg/m.     Intake/Output Summary (Last 24 hours) at 01/07/2023 1619  Last data filed at 01/07/2023 0517  Gross per 24 hour   Intake 480 ml   Output 400 ml   Net 80 ml       Physical examination:  GEN: alert, orientedx3, in respiratory distress  HEENT: PERRLA, mucous membranes are moist, No jugular venous distention or carotid bruit is  noted  PULM: Decreased breath sounds over left lung base, no wheezing  CARD:  Normal S1/S2, no m/r/g  ABD:  Soft, non-tender, non-distended  NEURO:  No focal deficit, CN II-XII intact  EXT: 1+ edema of the bilateral knees    CARDIOVASCULAR STUDIES:   ECG 12/31/22: NSR 96 bpm, iLBBB, prolonged QTc (485 ms)   TTE 12/25/2022: LVEF 39%, grade 2 diastolic function, moderately dilated right ventricle with normal function, no hemodynamically significant valvular disease    LABS:     Basic Metabolic Profile   Lab Results   Component Value Date/Time    NA 134 (L) 01/07/2023 06:09 AM    NA 134 (L) 01/06/2023 08:08 AM    NA 135 (L) 01/04/2023 04:11 AM    K 3.8 01/07/2023 06:09 AM    K 4.1 01/06/2023 08:08 AM    K 4.0 01/04/2023 04:11 AM    CL 97 (L) 01/07/2023 06:09 AM    CL 99 01/06/2023 08:08 AM    CL 99 01/04/2023 04:11 AM    CO2 30 01/07/2023 06:09 AM    CO2 28 01/06/2023 08:08 AM    CO2 30 01/04/2023 04:11 AM    BUN 16 01/07/2023 06:09 AM    BUN 14 01/06/2023  08:08 AM    BUN 25 (H) 01/04/2023 04:11 AM    CREATININE 1.11 01/07/2023 06:09 AM    CREATININE 0.93 01/06/2023 08:08 AM    CREATININE 1.26 01/04/2023 04:11 AM   .       Lab Results   Component Value Date/Time    WBC 17.2 (H) 01/07/2023 06:09 AM    WBC 18.9 (H) 01/06/2023 08:08 AM    HGB 13.5 01/07/2023 06:09 AM    HGB 13.9 01/06/2023 08:08 AM    HGB 14.0 01/05/2023 09:20 PM    HCT 41.8 01/07/2023 06:09 AM    HCT 43.5 01/06/2023 08:08 AM    HCT 43.6 01/05/2023 09:20 PM    PLT 474 (H) 01/07/2023 06:09 AM    PLT 455 (H) 01/06/2023 08:08 AM         Cardiac Enzymes   No components found for: "3", "TROP"     Coagulation   Lab Results   Component Value Date/Time    INR 1.6 (H) 01/01/2023 12:30 AM    INR 1.7 (H) 12/31/2022 04:54 PM    APTT 34.6 12/31/2022 04:54 PM        Lipids/A1C   Lab Results   Component Value Date    LABA1C 5.8 (H) 12/31/2022       Lab Results   Component Value Date    CHOL 114 12/31/2022    HDL <20 (L) 12/31/2022    CHOLHDLRATIO 6.0 (H) 12/31/2022     TRIG 127 12/31/2022        MEDICATIONS:     Current Facility-Administered Medications   Medication Dose Route Frequency Provider Last Rate Last Admin    levoFLOXacin (LEVAQUIN) tablet 750 mg  750 mg Oral Daily Romulo, Rodrigo L, MD   750 mg at 01/07/23 1016    morphine (PF) injection 2 mg  2 mg IntraVENous Q4H PRN Theodis Aguas, MD   2 mg at 01/07/23 0651    heparin (porcine) injection 5,000 Units  5,000 Units SubCUTAneous BID Verlan Friends, MD   5,000 Units at 01/07/23 1016    sodium chloride flush 0.9 % injection 10 mL  10 mL IntraVENous PRN Williams Che, Alfhild Partch, MD   10 mL at 01/02/23 0930    bisacodyl (DULCOLAX) suppository 10 mg  10 mg Rectal Daily PRN Theodis Aguas, MD        losartan (COZAAR) tablet 25 mg  25 mg Oral Daily Burgess Estelle, MD   25 mg at 01/07/23 1015    docusate sodium (COLACE) capsule 100 mg  100 mg Oral BID Odelia Gage III, MD   100 mg at 01/07/23 1015    benzonatate (TESSALON) capsule 100 mg  100 mg Oral TID PRN Richarda Overlie, DO   100 mg at 01/05/23 1628    albuterol (ACCUNEB) nebulizer solution 1.25 mg  1.25 mg Nebulization Q6H PRN Richarda Overlie, DO        nystatin (MYCOSTATIN) powder   Topical BID Hutchinson, Anne, DO   Given at 01/07/23 1018    senna (SENOKOT) tablet 8.6 mg  1 tablet Oral Nightly Hutchinson, Anne, DO   8.6 mg at 01/01/23 2246    polyethylene glycol (GLYCOLAX) packet 17 g  17 g Oral Daily Hutchinson, Anne, DO   17 g at 01/06/23 1022    furosemide (LASIX) injection 40 mg  40 mg IntraVENous BID Hutchinson, Anne, DO   40 mg at 01/07/23 1016    potassium chloride (KLOR-CON M) extended release tablet 20 mEq  20 mEq Oral Daily Richarda Overlie, DO   20 mEq at 01/07/23 1016    carvedilol (COREG) tablet 6.25 mg  6.25 mg Oral BID WC Hutchinson, Anne, DO   6.25 mg at 01/07/23 1016    sodium chloride flush 0.9 % injection 5-40 mL  5-40 mL IntraVENous 2 times per day Richarda Overlie, DO   5 mL at 01/07/23 1051    sodium chloride flush 0.9 % injection 5-40 mL  5-40  mL IntraVENous PRN Hutchinson, Anne, DO        0.9 % sodium chloride infusion   IntraVENous PRN Hutchinson, Anne, DO        acetaminophen (TYLENOL) tablet 650 mg  650 mg Oral Q6H PRN Richarda Overlie, DO   650 mg at 01/03/23 1245    Or    acetaminophen (TYLENOL) suppository 650 mg  650 mg Rectal Q6H PRN Hutchinson, Anne, DO        lidocaine 4 % external patch 1 patch  1 patch TransDERmal Daily Hutchinson, Anne, DO   1 patch at 01/06/23 1020     Thank you for involving [place] in Mr. Gangl's care.  We will continue to follow.  Please call with any questions or concerns.    Etola Mull Williams Che, MD, [facility], RPVI, [facility]  Interventional Cardiologist  [facility]  Pager: [phone]

## 2023-01-08 DIAGNOSIS — I10 Essential (primary) hypertension: Secondary | ICD-10-CM | POA: Diagnosis not present

## 2023-01-08 DIAGNOSIS — J302 Other seasonal allergic rhinitis: Secondary | ICD-10-CM | POA: Diagnosis not present

## 2023-01-08 DIAGNOSIS — I5021 Acute systolic (congestive) heart failure: Secondary | ICD-10-CM | POA: Diagnosis not present

## 2023-01-08 DIAGNOSIS — I429 Cardiomyopathy, unspecified: Secondary | ICD-10-CM | POA: Diagnosis not present

## 2023-01-08 DIAGNOSIS — J90 Pleural effusion, not elsewhere classified: Secondary | ICD-10-CM | POA: Diagnosis not present

## 2023-01-08 LAB — CBC WITH AUTO DIFFERENTIAL
Anisocytosis: 1
Atypical Lymphocytes: 2 % — ABNORMAL HIGH (ref 0–0)
Basophils: 0.9 % (ref 0–3)
Eosinophils: 3 % (ref 0–5)
Hematocrit: 43.1 % (ref 36.0–55.0)
Hemoglobin: 13.8 gm/dl (ref 13.0–18.0)
Lymphocytes: 13 % — ABNORMAL LOW (ref 28–48)
MCH: 28.7 pg (ref 23.0–34.6)
MCHC: 32 gm/dl (ref 30.0–36.0)
MCV: 89.6 fL (ref 80.0–98.0)
MPV: 9.5 fL (ref 6.0–10.0)
Macrocytes: 1
Metamyelocytes: 1 % — ABNORMAL HIGH (ref 0–0)
Monocytes: 14 % — ABNORMAL HIGH (ref 1–13)
Myelocytes: 4 % — ABNORMAL HIGH (ref 0–0)
Neutrophils Segmented: 63 % (ref 34–64)
Nucleated RBCs: 0 (ref 0–0)
Platelet Appearance: INCREASED
Platelets: 492 10*3/uL — ABNORMAL HIGH (ref 140–450)
RBC: 4.81 M/uL (ref 3.80–5.70)
RDW: 45.7 — ABNORMAL HIGH (ref 35.1–43.9)
WBC: 13.1 10*3/uL — ABNORMAL HIGH (ref 4.0–11.0)

## 2023-01-08 LAB — BASIC METABOLIC PANEL
Anion Gap: 7 mmol/L (ref 5–15)
BUN: 13 mg/dl (ref 9–23)
CO2: 29 mEq/L (ref 20–31)
Calcium: 8.9 mg/dl (ref 8.7–10.4)
Chloride: 99 mEq/L (ref 98–107)
Creatinine: 1.04 mg/dl (ref 0.70–1.30)
GFR African American: >60
GFR Non-African American: >60
Glucose: 99 mg/dl (ref 74–106)
Potassium: 3.9 mEq/L (ref 3.5–5.1)
Sodium: 135 mEq/L — ABNORMAL LOW (ref 136–145)

## 2023-01-08 LAB — MAGNESIUM: Magnesium: 1.8 mg/dL (ref 1.6–2.6)

## 2023-01-08 MED ORDER — NITROGLYCERIN 0.4 MG/SPRAY TL SOLN
0.4 | Freq: Once | Status: DC
Start: 2023-01-08 — End: 2023-01-08

## 2023-01-08 MED ORDER — FUROSEMIDE 40 MG PO TABS
40 MG | Freq: Every day | ORAL | Status: AC
Start: 2023-01-08 — End: 2023-01-10
  Administered 2023-01-09 – 2023-01-10 (×2): 40 mg via ORAL

## 2023-01-08 MED ORDER — SACUBITRIL-VALSARTAN 24-26 MG PO TABS
24-26 MG | Freq: Two times a day (BID) | ORAL | Status: AC
Start: 2023-01-08 — End: 2023-01-10
  Administered 2023-01-09 – 2023-01-10 (×3): 1 via ORAL

## 2023-01-08 MED ORDER — METOPROLOL TARTRATE 100 MG PO TABS
100 | Freq: Once | ORAL | Status: DC
Start: 2023-01-08 — End: 2023-01-08

## 2023-01-08 MED FILL — GERI-KOT 8.6 MG PO TABS: 8.6 MG | ORAL | Qty: 1

## 2023-01-08 MED FILL — CARVEDILOL 6.25 MG PO TABS: 6.25 MG | ORAL | Qty: 1

## 2023-01-08 MED FILL — DOCUSATE SODIUM 100 MG PO CAPS: 100 MG | ORAL | Qty: 1

## 2023-01-08 MED FILL — POLYETHYLENE GLYCOL 3350 17 G PO PACK: 17 g | ORAL | Qty: 1

## 2023-01-08 MED FILL — LEVOFLOXACIN 250 MG PO TABS: 250 MG | ORAL | Qty: 3

## 2023-01-08 MED FILL — SODIUM CHLORIDE FLUSH 0.9 % IV SOLN: 0.9 % | INTRAVENOUS | Qty: 10

## 2023-01-08 MED FILL — HEPARIN SODIUM (PORCINE) 5000 UNIT/ML IJ SOLN: 5000 UNIT/ML | INTRAMUSCULAR | Qty: 1

## 2023-01-08 MED FILL — MORPHINE SULFATE 2 MG/ML IJ SOLN: 2 MG/ML | INTRAMUSCULAR | Qty: 1

## 2023-01-08 MED FILL — MORPHINE SULFATE 2 MG/ML IJ SOLN: 2 mg/mL | INTRAMUSCULAR | Qty: 1

## 2023-01-08 MED FILL — POTASSIUM CHLORIDE CRYS ER 20 MEQ PO TBCR: 20 MEQ | ORAL | Qty: 1

## 2023-01-08 MED FILL — METOPROLOL TARTRATE 100 MG PO TABS: 100 MG | ORAL | Qty: 1

## 2023-01-08 MED FILL — LOSARTAN POTASSIUM 25 MG PO TABS: 25 MG | ORAL | Qty: 1

## 2023-01-08 MED FILL — GNP LIDOCAINE PAIN RELIEF 4 % EX PTCH: 4 % | CUTANEOUS | Qty: 1

## 2023-01-08 MED FILL — FUROSEMIDE 40 MG PO TABS: 40 MG | ORAL | Qty: 1

## 2023-01-08 NOTE — Progress Notes (Signed)
Dr Tenny Craw               Page Sent        PAGER ID: [phone]  MESSAGE: [phone] RF PT OT please when appropriate. to assist with dc planning ty [facility]

## 2023-01-08 NOTE — Progress Notes (Addendum)
[facility]   (A Division of [facility])    Follow up Note      Date of Admission: 12/31/2022    Date of Note: 01/08/23        Subjective / Interval History:  Coronary CTA postponed today because patient ate.  Afebrile.  WBC down to 13.1.  Still has intermittent cough but improving.           Current Antimicrobials:    Prior Antimicrobials:  Levofloxacin 5/31 - 3  Doxycycline 5/26 - 1  Vanc 5/26 - 1 Piptazo 5/26 - 3 Ceftriaxone 5/29 - 1          Assessment:  Rec / Plan:   Right Pleural effusion, parapneumonic  - two weeks worsening cough, some mild chills, sweats, no fever, + hoarseness  - WBC count 34.5  - CXR 5/26:  mod to large R loculated pleural effusion w/ atelectasis in underlying lung  - spcx 5/26 Haemophilus influenzae  - Urag neg Legionella, S pneumo  - refused CT placement in ER 5/26  - s/p 5/27 CT guided R chest tube placement - 300 cc dark yellow, mildly bloody fluid. 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  He has chronic "allergic" symptoms of rhinorrhea, cough that he uses OTC allergy medications for.  Around 2 weeks PTA he noticed worsening of his cough with shortness of breath.  Cough was productive of light to dark-brown sputum, occasionally blood-tinged.  Had no fever but had occasional mild chills and non-drenching sweats. Developed hoarseness which worsened around one week prior to admission and  dyspnea worsened leading to his presentation at ER 5/26.  Afebrile but WBC count was 34.5 and CXR showed a moderated to large R loculated pleural effusion w/ atelectasis in underlying lung.  Left lung is clear.  Acute Respiratory Pathogen Panel was negative. Seen by Dr. Effie Shy of Cardiothoracic Surgery who offered Chest tube placement in ED but the patient declined so CT guided Chest tube placement was scheduled.  Vancomycin, Piperacillin-tazobactam and Doxycycline started 5/26.     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  He has no known exposure to TB, does not keep pet birds, is not exposed to poultry or other livestock.  He does not smoke cigarettes or vape but admits to occasional "weed".  Denies IV drug use       Current Facility-Administered Medications   Medication Dose Route Frequency Provider Last Rate Last Admin    [START ON 01/09/2023] sacubitril-valsartan (ENTRESTO) 24-26 MG per tablet 1 tablet  1 tablet Oral BID Rolin Barry, DO        [START ON 01/09/2023] furosemide (LASIX) tablet 40 mg  40 mg Oral Daily Rolin Barry,  DO        levoFLOXacin (LEVAQUIN) tablet 750 mg  750 mg Oral Daily Andrej Spagnoli L, MD   750 mg at 01/08/23 0840    morphine (PF) injection 2 mg  2 mg IntraVENous Q4H PRN Theodis Aguas, MD   2 mg at 01/08/23 1119    heparin (porcine) injection 5,000 Units  5,000 Units SubCUTAneous BID Verlan Friends, MD   5,000 Units at 01/08/23 0840    sodium chloride flush 0.9 % injection 10 mL  10 mL IntraVENous PRN Berton [place], MD   10 mL at 01/02/23 0930    bisacodyl (DULCOLAX) suppository 10 mg  10 mg Rectal Daily PRN Theodis Aguas, MD        docusate sodium (COLACE) capsule 100 mg  100 mg Oral BID Odelia Gage III, MD   100 mg at 01/07/23 2126    benzonatate (TESSALON) capsule 100 mg  100 mg Oral TID PRN Richarda Overlie, DO   100 mg at 01/05/23 1628    albuterol (ACCUNEB) nebulizer solution 1.25 mg  1.25 mg Nebulization Q6H PRN Richarda Overlie, DO        nystatin (MYCOSTATIN) powder   Topical BID Richarda Overlie, DO   Given at 01/08/23 1610    senna (SENOKOT) tablet 8.6 mg  1 tablet Oral Nightly Richarda Overlie, DO   8.6 mg at 01/07/23 2126    polyethylene glycol (GLYCOLAX) packet 17 g  17 g Oral Daily Richarda Overlie, DO   17 g at 01/06/23 1022    potassium chloride (KLOR-CON M) extended release tablet 20 mEq  20 mEq Oral Daily Hutchinson, Anne, DO   20 mEq at 01/08/23 0840    carvedilol (COREG) tablet 6.25 mg  6.25 mg Oral BID WC Hutchinson, Anne, DO   6.25 mg at 01/08/23 1741    sodium chloride flush 0.9 % injection 5-40 mL  5-40 mL IntraVENous 2 times per day Richarda Overlie, DO   10 mL at 01/08/23 0841    sodium chloride flush 0.9 % injection 5-40 mL  5-40 mL IntraVENous PRN Hutchinson, Anne, DO        0.9 % sodium chloride infusion   IntraVENous PRN Richarda Overlie, DO        acetaminophen (TYLENOL) tablet 650 mg  650 mg Oral Q6H PRN Richarda Overlie, DO   650 mg at 01/03/23 1245    Or    acetaminophen (TYLENOL) suppository 650 mg  650 mg Rectal Q6H PRN Hutchinson, Anne, DO        lidocaine 4 % external patch 1 patch  1 patch TransDERmal Daily Hutchinson, Anne, DO   1 patch at 01/08/23 0840        ROS:  Negative except as in interval history      Objective:    Vitals:    01/08/23 1601   BP: 99/87   Pulse: 77   Resp: 18   Temp: 97.5 F (36.4 C)   SpO2: 99%        Physical Exam   Constitutional:       General: He is not in acute distress.     Appearance: He is obese. He is not toxic-appearing.   HENT:      Head: Normocephalic and atraumatic.      Nose: Nose normal.      Mouth/Throat:      Pharynx: Oropharynx is clear.   Eyes:      Extraocular Movements: Extraocular movements intact.  Conjunctiva/sclera: Conjunctivae normal.   Cardiovascular:      Rate and Rhythm: Regular rhythm.      Heart sounds: No murmur heard.     No gallop.   Pulmonary:       no crackles or wheezes  Abdominal:      Palpations: Abdomen is soft.      Tenderness:  There is no abdominal tenderness.   Musculoskeletal:      Cervical back: Neck supple.      Right lower leg: No edema.      Left lower leg: No edema.   Skin:     Findings: No rash.   Neurological:      Mental Status: He is alert and oriented to person, place, and time.   Psychiatric:         Mood and Affect: Mood normal.         Behavior: Behavior normal.     Recent Labs     01/08/23  0607 01/07/23  0609 01/06/23  0808   WBC 13.1* 17.2* 18.9*   HGB 13.8 13.5 13.9   HCT 43.1 41.8 43.5   MCV 89.6 87.6 87.5   PLT 492* 474* 455*          Recent Labs     01/08/23  0607 01/07/23  0609 01/06/23  0808   BUN 13 16 14    CREATININE 1.04 1.11 0.93         Results       Procedure Component Value Units Date/Time    Culture, Body Fluid [[id number]] Collected: 01/01/23 1440    Order Status: Completed Specimen: Body fld Updated: 01/06/23 0949     Gram Stain Result Moderate WBC'S  No Organisms Seen        Culture Result No Growth After 5 Days       Culture, Respiratory [[id number]]  (Abnormal) Collected: 12/31/22 1907    Order Status: Completed Specimen: RESPIRATORY Updated: 01/04/23 1237     Gram Stain Result       <10 Epithelial cells/lpf  >25 WBC's/lpf  Rare Gram Positive Cocci  Rare Gram Variable Bacilli       Culture Result       Moderate Commensal Flora Isolated           ISOLATE --        Moderate  Haemophilus influenzae  Beta-Lactamase Positive      Culture, Urine [[id number]]  (Abnormal) Collected: 12/31/22 1730    Order Status: Completed Specimen: Urine Updated: 01/02/23 0831     Culture Result --        60,000 CFU/mL  Skin and/or genital contamination      Legionella antigen, urine [[id number]] Collected: 12/31/22 1656    Order Status: Completed Specimen: Urine Updated: 12/31/22 1746     Legionella Urinary Ag NEGATIVE        Comment: Presumptive negative for L. pneumophila serogroup 1 antigen in urine, suggesting no recent or  current infection. Infection due to Legionella cannot be ruled out since other serogroups  and  species may cause disease, antigen may not be present in urine in early infection and the level  of antigen present in the urine may be below the detection limit of the test.           Strep Pneumoniae Antigen [[id number]] Collected: 12/31/22 1656    Order Status: Completed Specimen: Urine Updated: 12/31/22 1746     STREP PNEUMONIAE ANTIGEN, URINE NEGATIVE  Comment: Presumptive negative for pneumococcal pneumonia, suggesting no current or recent pneumococcal  infection.  Infection due to S. pneumoniae cannot be ruled out since the antigen present in the  sample may be below the detection limit of the test.         Blood Culture 1 [[id number]] Collected: 12/31/22 1637    Order Status: Completed Specimen: Blood Updated: 01/06/23 0719     BLOOD CULTURE RESULT No Growth at 5 days       Blood Culture 2 [[id number]] Collected: 12/31/22 1637    Order Status: Completed Specimen: Blood Updated: 01/06/23 0719     BLOOD CULTURE RESULT No Growth at 5 days       Respiratory Pathogen Panel [[id number]] Collected: 12/31/22 1545    Order Status: Completed Specimen: Nasopharyngeal Swab Updated: 12/31/22 1940     Adenovirus NOT DETECTED        Coronavirus 299E NOT DETECTED        Coronavirus HKU1 NOT DETECTED        Coronavirus NL63 NOT DETECTED        Coronavirus OC43 NOT DETECTED        Human Metapneumovirus NOT DETECTED        INFLUENZA A H1 NOT DETECTED        Influenza H1N1 NOT DETECTED        Influenza A PCR NOT DETECTED        Influenza B PCR NOT DETECTED        Influenza A H3 NOT DETECTED        Parainfluenza 1 NOT DETECTED        Parainfluenza 2 NOT DETECTED        Parainfluenza 3 NOT DETECTED        Parainfluenza NOT DETECTED        Rhinovirus/Enterovirus NOT DETECTED        RSV PCR NOT DETECTED        Bordetella Pertussis NOT DETECTED        Chlamydophilia pneumoniae NOT DETECTED        Mycoplasma pneumoniae NOT DETECTED        SARS-CoV-2 NOT DETECTED        Comment: A Not Detected (negative) test result for  this test means that SARS- CoV-2 RNA was not present  in the specimen above the limit of detection. However, a negative result does not rule out the  possibility of COVID-19 and should not be used as the sole basis for treatment or patient  management decisions.   This test for SARS-CoV2 has been authorized by FDA under an Emergency Use Authorization (EUA).         Rapid Strep Screen [[id number]] Collected: 12/31/22 1545    Order Status: Completed Specimen: Throat Updated: 12/31/22 1747     Strep A Ag       Negative - No Streptococcus Group A DNA was Detected.           Comment: NEGATIVE Streptococcus group A PCR do NOT REFLEX to culture since PCR is very sensitive (equal  to culture for Strep. group A), however, if pharyngitis is suspected by another beta hemolytic  Streptococcus group then you must order a throat culture. Call microbiology ext [phone] if culture  is needed to be added on for testing.         MRSA by PCR [[id number]] Collected: 12/31/22 1545    Order Status: Completed Specimen: Nares Updated: 12/31/22 1852     MRSA PCR screen NEGATIVE  Jerilynn Som, MD, FIDSA  [facility]

## 2023-01-08 NOTE — Progress Notes (Signed)
Pt had lunch. CTA cancelled per MD Corinna Capra and MD Chowdury.    Per MD, NPO at MN for possible cath vs CTA    Updated Sonya RN on plan

## 2023-01-08 NOTE — Progress Notes (Signed)
Medical Progress Note      NAME: Thomas Browning   DOB:  15-Oct-1965  MRN:             [id number]    Date:  01/08/2023        Assessment:     Large loculated right pleural effusion  Acute hypoxic respiratory failure  Acute on chronic systolic, diastolic  Sepsis, suspect respiratory source  Hypertensive urgency  Prolonged QTc  DM 2,   Morbid obesity    Plan:     Coronary CTA versus cardiac cath per cardiology  Continue current medical management  Continue to monitor respiratory status  Continue antibiotics with levofloxacin to complete course of treatment  Monitor CBC  Ambulate patient with PT and OT  Monitor renal function and electrolytes, replete as needed  Monitor blood glucose and continue insulin per Glucomander  Bowel Regimen for constipation  Patient has full CODE STATUS  DVT prophylaxis    Subjective:     Afebrile, no chest pain    Objective:     Vitals:    Last 24hrs VS reviewed since prior progress note. Most recent are:  Vitals:    01/08/23 1145   BP: (!) 126/92   Pulse: 81   Resp: 18   Temp: 98.1 F (36.7 C)   SpO2: 93%     SpO2 Readings from Last 6 Encounters:   01/08/23 93%        No intake or output data in the 24 hours ending 01/08/23 1251       Exam:     General:   Not in acute distress  HEENT: PERRLA, Neck Supple,  No JVD  Respiratory:   CTA bilaterally-no wheezes, rales, rhonchi, or crackles  Cardiac:  Regular Rate and Rythmn  - no murmurs, rubs or gallops  Abdominal:  Soft, non-tender, non-distended, positive bowel sounds  Extremities:  No cyanosis, or edema.  Skin: No rash  Neurological:  No focal neurological deficits    Medication:   Current Medications Reviewed    Current Facility-Administered Medications:     [START ON 01/09/2023] sacubitril-valsartan (ENTRESTO) 24-26 MG per tablet 1 tablet, 1 tablet, Oral, BID, Sposato, Joseph J, DO    furosemide (LASIX) tablet 40 mg, 40 mg, Oral, BID, Williams Che, Mohsin, MD, 40 mg at 01/08/23 0840    levoFLOXacin (LEVAQUIN) tablet 750 mg, 750 mg, Oral, Daily,  Romulo, Rodrigo L, MD, 750 mg at 01/08/23 0840    morphine (PF) injection 2 mg, 2 mg, IntraVENous, Q4H PRN, Theodis Aguas, MD, 2 mg at 01/08/23 1119    heparin (porcine) injection 5,000 Units, 5,000 Units, SubCUTAneous, BID, Verlan Friends, MD, 5,000 Units at 01/08/23 0840    sodium chloride flush 0.9 % injection 10 mL, 10 mL, IntraVENous, PRN, Williams Che, Mohsin, MD, 10 mL at 01/02/23 0930    bisacodyl (DULCOLAX) suppository 10 mg, 10 mg, Rectal, Daily PRN, Theodis Aguas, MD    docusate sodium (COLACE) capsule 100 mg, 100 mg, Oral, BID, Odelia Gage III, MD, 100 mg at 01/07/23 2126    benzonatate (TESSALON) capsule 100 mg, 100 mg, Oral, TID PRN, Hutchinson, Anne, DO, 100 mg at 01/05/23 1628    albuterol (ACCUNEB) nebulizer solution 1.25 mg, 1.25 mg, Nebulization, Q6H PRN, Hutchinson, Anne, DO    nystatin (MYCOSTATIN) powder, , Topical, BID, Hutchinson, Anne, DO, Given at 01/08/23 4540    senna (SENOKOT) tablet 8.6 mg, 1 tablet, Oral, Nightly, Hutchinson, Anne, DO, 8.6 mg at 01/07/23 2126  polyethylene glycol (GLYCOLAX) packet 17 g, 17 g, Oral, Daily, Hutchinson, Anne, DO, 17 g at 01/06/23 1022    potassium chloride (KLOR-CON M) extended release tablet 20 mEq, 20 mEq, Oral, Daily, Hutchinson, Anne, DO, 20 mEq at 01/08/23 0840    carvedilol (COREG) tablet 6.25 mg, 6.25 mg, Oral, BID WC, Hutchinson, Anne, DO, 6.25 mg at 01/08/23 0840    sodium chloride flush 0.9 % injection 5-40 mL, 5-40 mL, IntraVENous, 2 times per day, Hutchinson, Anne, DO, 10 mL at 01/08/23 0841    sodium chloride flush 0.9 % injection 5-40 mL, 5-40 mL, IntraVENous, PRN, Hutchinson, Anne, DO    0.9 % sodium chloride infusion, , IntraVENous, PRN, Hutchinson, Anne, DO    acetaminophen (TYLENOL) tablet 650 mg, 650 mg, Oral, Q6H PRN, 650 mg at 01/03/23 1245 **OR** acetaminophen (TYLENOL) suppository 650 mg, 650 mg, Rectal, Q6H PRN, Hutchinson, Anne, DO    lidocaine 4 % external patch 1 patch, 1 patch, TransDERmal, Daily, Hutchinson, Anne,  DO, 1 patch at 01/08/23 0840      Lab:     Lab Data Reviewed: (see below)  Recent Results (from the past 24 hour(s))   CBC with Auto Differential    Collection Time: 01/08/23  6:07 AM   Result Value Ref Range    WBC 13.1 (H) 4.0 - 11.0 1000/mm3    Neutrophils Segmented 63.0 34 - 64 %    RBC 4.81 3.80 - 5.70 M/uL    Lymphocytes 13.0 (L) 28 - 48 %    Hemoglobin 13.8 13.0 - 18.0 gm/dl    Monocytes 86.5 (H) 1 - 13 %    Hematocrit 43.1 36.0 - 55.0 %    Eosinophils 3.0 0 - 5 %    MCV 89.6 80.0 - 98.0 fL    MCH 28.7 23.0 - 34.6 pg    MCHC 32.0 30.0 - 36.0 gm/dl    Metamyelocytes 1.0 (H) 0 - 0 %    Platelets 492 (H) 140 - 450 1000/mm3    Myelocytes 4.0 (H) 0 - 0 %    MPV 9.5 6.0 - 10.0 fL    RDW 45.7 (H) 35.1 - 43.9      Atypical Lymphocytes 2.0 (H) 0 - 0 %    Nucleated RBCs 0 0 - 0      Anisocytosis 1      Macrocytes 1      Basophils 0.9 0 - 3 %    Platelet Appearance INCREASED     Basic Metabolic Panel    Collection Time: 01/08/23  6:07 AM   Result Value Ref Range    Potassium 3.9 3.5 - 5.1 mEq/L    Chloride 99 98 - 107 mEq/L    Sodium 135 (L) 136 - 145 mEq/L    CO2 29 20 - 31 mEq/L    Glucose 99 74 - 106 mg/dl    BUN 13 9 - 23 mg/dl    Creatinine 7.84 6.96 - 1.30 mg/dl    GFR African American >60      GFR Non-African American >60      Calcium 8.9 8.7 - 10.4 mg/dl    Anion Gap 7 5 - 15 mmol/L   Magnesium    Collection Time: 01/08/23  6:07 AM   Result Value Ref Range    Magnesium 1.8 1.6 - 2.6 mg/dL         @[id number] @    Principal Problem:    Pleural effusion on right  Active Problems:  SOB (shortness of breath)  Resolved Problems:    * No resolved hospital problems. *    ____________________________________________________________________      Attending Physician: Theodis Aguas, MD       Dragon medical dictation software was used for portions of this report. Unintended voice recognition errors may occur.

## 2023-01-08 NOTE — Progress Notes (Signed)
[facility]    PROGRESS NOTE    Patient seen in follow-up for: New biventricular cardiomyopathy, right pleural effusion    PLAN:  Cardiomyopathy: Coronary CTA canceled today due to eating.  Will reschedule CTA for tomorrow.  Continue GDMT to include Coreg 6.25 mg twice daily, losartan 25 mg daily.  Will change losartan to [facility] tomorrow.  Will add spironolactone as an outpatient.  Reduce Lasix to once daily.  Pleural effusion: Status post pigtail catheter placement.  Breathing normally  Leukocytosis: White count continues to improve 13.1 today.    ASSESSMENT:  Thomas Browning is a 57 y.o. male with:  Cardiomyopathy  Echo 5/58/24 LVEF 39%, grade II diastolic dysfunction, moderate to severe RV dilation with visually reduced systolic function , est RVSP 47 mmHg  Right pleural effusion // empyema  Status post right pigtail catheter placement 01/01/2023, removed 01/04/2023  Initial treatment with thrombolytic therapy  Leukocytosis  Blood culture, pleural fluid culture negative  Urine culture consistent with contamination.  Respiratory culture with moderate commensal flora    Principal Problem:    Pleural effusion on right  Active Problems:    SOB (shortness of breath)  Resolved Problems:    * No resolved hospital problems. *      SUBJECTIVE:  No CP or SOB    Hospital Problems             Last Modified POA    * (Principal) Pleural effusion on right 12/31/2022 Yes    Overview Signed 12/31/2022  4:00 PM by Drinda Butts, MD     12/31/22 ER The patient presents for intermittent SOB for several weeks that increased today. The patient denies known CHF history, but reports bilateral leg swelling.  BP 153/88, HR 107, Temp 97.9,  K 3.9, BUN 30, Creat 1.34, BNP 1729, hsTrop I 27, WBC 34.5 with 81% poly and 9% Bands.   CXR  moderate to large right loculated pleural effusion with atelectasis in the underlying lung..  The left lung is clear and the heart is of normal size.         SOB (shortness of breath)  12/31/2022 Yes    Overview Signed 12/31/2022  8:55 PM by Drinda Butts, MD     06/10/20 ECHO EF 45-50%, LV 34/46, S/PW 11/10, LAVI 21, Tr MR/TR, AVG 5mm, Gr I DD.              VS: Blood pressure 119/73, pulse 96, temperature 97.3 F (36.3 C), temperature source Temporal, resp. rate 19, height 1.803 m (5\' 11" ), weight (!) 154.3 kg (340 lb 2.7 oz), SpO2 96 %.  Wt Readings from Last 3 Encounters:   12/31/22 (!) 154.3 kg (340 lb 2.7 oz)      Body mass index is 47.44 kg/m.     Intake/Output Summary (Last 24 hours) at 01/08/2023 1010  Last data filed at 01/07/2023 1231  Gross per 24 hour   Intake 250 ml   Output --   Net 250 ml         EXAM:  General:  No acute distress  Eyes: EOMI  Neck: JVD is absent  Lungs: clear, no rales, no rhonchi   Cardiac:  Regular Rhythm, No murmur,   Abdomen: soft, non-tender,  Ext: Edema is 1+  Neuro: Alert and oriented    Labs:  CMP:   Recent Labs     01/06/23  0808 01/07/23  0609 01/08/23  0607   NA 134* 134* 135*   K 4.1  3.8 3.9   CL 99 97* 99   CO2 28 30 29    BUN 14 16 13    CREATININE 0.93 1.11 1.04   GLUCOSE 109* 97 99   CALCIUM 8.6* 9.1 8.9        CBC:  Recent Labs     01/06/23  0808 01/07/23  0609 01/08/23  0607   WBC 18.9* 17.2* 13.1*   RBC 4.97 4.77 4.81   HGB 13.9 13.5 13.8   HCT 43.5 41.8 43.1   MCV 87.5 87.6 89.6   MCH 28.0 28.3 28.7   MCHC 32.0 32.3 32.0   RDW 46.1* 45.8* 45.7*   PLT 455* 474* 492*   MPV 9.1 9.3 9.5        HS-Troponin:  Lab Results   Component Value Date/Time    TROPHS 27 12/31/2022 02:33 PM         Medications:  Current Facility-Administered Medications   Medication Dose Route Frequency Provider Last Rate Last Admin    furosemide (LASIX) tablet 40 mg  40 mg Oral BID Williams Che, Mohsin, MD   40 mg at 01/08/23 0840    levoFLOXacin (LEVAQUIN) tablet 750 mg  750 mg Oral Daily Romulo, Rodrigo L, MD   750 mg at 01/08/23 0840    morphine (PF) injection 2 mg  2 mg IntraVENous Q4H PRN Theodis Aguas, MD   2 mg at 01/07/23 1645    heparin (porcine) injection 5,000 Units  5,000  Units SubCUTAneous BID Verlan Friends, MD   5,000 Units at 01/08/23 0840    sodium chloride flush 0.9 % injection 10 mL  10 mL IntraVENous PRN Williams Che, Mohsin, MD   10 mL at 01/02/23 0930    bisacodyl (DULCOLAX) suppository 10 mg  10 mg Rectal Daily PRN Theodis Aguas, MD        losartan (COZAAR) tablet 25 mg  25 mg Oral Daily Burgess Estelle, MD   25 mg at 01/08/23 0840    docusate sodium (COLACE) capsule 100 mg  100 mg Oral BID Odelia Gage III, MD   100 mg at 01/07/23 2126    benzonatate (TESSALON) capsule 100 mg  100 mg Oral TID PRN Richarda Overlie, DO   100 mg at 01/05/23 1628    albuterol (ACCUNEB) nebulizer solution 1.25 mg  1.25 mg Nebulization Q6H PRN Richarda Overlie, DO        nystatin (MYCOSTATIN) powder   Topical BID Richarda Overlie, DO   Given at 01/08/23 9147    senna (SENOKOT) tablet 8.6 mg  1 tablet Oral Nightly Hutchinson, Anne, DO   8.6 mg at 01/07/23 2126    polyethylene glycol (GLYCOLAX) packet 17 g  17 g Oral Daily Richarda Overlie, DO   17 g at 01/06/23 1022    potassium chloride (KLOR-CON M) extended release tablet 20 mEq  20 mEq Oral Daily Hutchinson, Anne, DO   20 mEq at 01/08/23 0840    carvedilol (COREG) tablet 6.25 mg  6.25 mg Oral BID WC Hutchinson, Anne, DO   6.25 mg at 01/08/23 0840    sodium chloride flush 0.9 % injection 5-40 mL  5-40 mL IntraVENous 2 times per day Richarda Overlie, DO   10 mL at 01/08/23 0841    sodium chloride flush 0.9 % injection 5-40 mL  5-40 mL IntraVENous PRN Hutchinson, Anne, DO        0.9 % sodium chloride infusion   IntraVENous PRN Richarda Overlie, DO  acetaminophen (TYLENOL) tablet 650 mg  650 mg Oral Q6H PRN Richarda Overlie, DO   650 mg at 01/03/23 1245    Or    acetaminophen (TYLENOL) suppository 650 mg  650 mg Rectal Q6H PRN Richarda Overlie, DO        lidocaine 4 % external patch 1 patch  1 patch TransDERmal Daily Hutchinson, Anne, DO   1 patch at 01/08/23 0840        Rolin Barry, DO  [facility]    January 08, 2023  10:10 AM

## 2023-01-09 ENCOUNTER — Ambulatory Visit: Admit: 2023-01-09 | Primary: Diagnostic Radiology

## 2023-01-09 DIAGNOSIS — J302 Other seasonal allergic rhinitis: Secondary | ICD-10-CM | POA: Diagnosis not present

## 2023-01-09 DIAGNOSIS — I10 Essential (primary) hypertension: Secondary | ICD-10-CM | POA: Diagnosis not present

## 2023-01-09 DIAGNOSIS — I429 Cardiomyopathy, unspecified: Secondary | ICD-10-CM | POA: Diagnosis not present

## 2023-01-09 DIAGNOSIS — J90 Pleural effusion, not elsewhere classified: Secondary | ICD-10-CM | POA: Diagnosis not present

## 2023-01-09 DIAGNOSIS — I5021 Acute systolic (congestive) heart failure: Secondary | ICD-10-CM | POA: Diagnosis not present

## 2023-01-09 LAB — BASIC METABOLIC PANEL
Anion Gap: 6 mmol/L (ref 5–15)
BUN: 13 mg/dl (ref 9–23)
CO2: 28 mEq/L (ref 20–31)
Calcium: 8.7 mg/dl (ref 8.7–10.4)
Chloride: 101 mEq/L (ref 98–107)
Creatinine: 1.14 mg/dl (ref 0.70–1.30)
GFR African American: >60
GFR Non-African American: >60
Glucose: 144 mg/dl — ABNORMAL HIGH (ref 74–106)
Potassium: 3.7 mEq/L (ref 3.5–5.1)
Sodium: 134 mEq/L — ABNORMAL LOW (ref 136–145)

## 2023-01-09 LAB — CBC WITH AUTO DIFFERENTIAL
Basophils: 0.6 % (ref 0–3)
Eosinophils: 1.7 % (ref 0–5)
Hematocrit: 40.7 % (ref 36.0–55.0)
Hemoglobin: 13 gm/dl (ref 13.0–18.0)
Immature Granulocytes %: 2.2 % (ref 0.0–3.0)
Lymphocytes: 17.7 % — ABNORMAL LOW (ref 28–48)
MCH: 28.2 pg (ref 23.0–34.6)
MCHC: 31.9 gm/dl (ref 30.0–36.0)
MCV: 88.3 fL (ref 80.0–98.0)
MPV: 8.8 fL (ref 6.0–10.0)
Monocytes: 10.2 % (ref 1–13)
Neutrophils Segmented: 67.6 % — ABNORMAL HIGH (ref 34–64)
Nucleated RBCs: 0 (ref 0–0)
Platelets: 517 10*3/uL — ABNORMAL HIGH (ref 140–450)
RBC: 4.61 M/uL (ref 3.80–5.70)
RDW: 45.7 — ABNORMAL HIGH (ref 35.1–43.9)
WBC: 11 10*3/uL (ref 4.0–11.0)

## 2023-01-09 LAB — MAGNESIUM: Magnesium: 1.7 mg/dL (ref 1.6–2.6)

## 2023-01-09 MED ORDER — FENTANYL CITRATE (PF) 100 MCG/2ML IJ SOLN
100 | INTRAMUSCULAR | Status: DC | PRN
Start: 2023-01-09 — End: 2023-01-09

## 2023-01-09 MED ORDER — LIDOCAINE HCL 1 % IJ SOLN
1 | INTRAMUSCULAR | Status: DC | PRN
Start: 2023-01-09 — End: 2023-01-09
  Administered 2023-01-09: 16:00:00 2 mL via INTRADERMAL

## 2023-01-09 MED ORDER — NORMAL SALINE FLUSH 0.9 % IV SOLN
0.9 % | INTRAVENOUS | Status: AC | PRN
Start: 2023-01-09 — End: 2023-01-10

## 2023-01-09 MED ORDER — NORMAL SALINE FLUSH 0.9 % IV SOLN
0.9 % | Freq: Two times a day (BID) | INTRAVENOUS | Status: AC
Start: 2023-01-09 — End: 2023-01-10
  Administered 2023-01-10: 13:00:00 10 mL via INTRAVENOUS

## 2023-01-09 MED ORDER — NITROGLYCERIN 500 MCG / 10 ML VIAL
Status: DC | PRN
Start: 2023-01-09 — End: 2023-01-09
  Administered 2023-01-09: 16:00:00 200 ug via INTRA_ARTERIAL

## 2023-01-09 MED ORDER — NITROGLYCERIN 500 MCG / 10 ML VIAL
Status: DC | PRN
Start: 2023-01-09 — End: 2023-01-09

## 2023-01-09 MED ORDER — SODIUM CHLORIDE 0.9 % IV SOLN
0.9 % | INTRAVENOUS | Status: AC | PRN
Start: 2023-01-09 — End: 2023-01-10

## 2023-01-09 MED ORDER — MELATONIN 3 MG PO TABS
3 MG | Freq: Every evening | ORAL | Status: AC | PRN
Start: 2023-01-09 — End: 2023-01-10
  Administered 2023-01-10: 3 mg via ORAL

## 2023-01-09 MED ORDER — DIPHENHYDRAMINE HCL 25 MG PO CAPS
25 MG | Freq: Four times a day (QID) | ORAL | Status: AC | PRN
Start: 2023-01-09 — End: 2023-01-10
  Administered 2023-01-10 (×2): 25 mg via ORAL

## 2023-01-09 MED ORDER — MIDAZOLAM HCL 2 MG/2ML IJ SOLN
2 | INTRAMUSCULAR | Status: DC | PRN
Start: 2023-01-09 — End: 2023-01-09

## 2023-01-09 MED ORDER — VERAPAMIL HCL 2.5 MG/ML IV SOLN
2.5 | Freq: Once | INTRAVENOUS | Status: AC
Start: 2023-01-09 — End: 2023-01-09
  Administered 2023-01-09: 16:00:00 2.5 mg via INTRA_ARTERIAL

## 2023-01-09 MED ORDER — SPIRONOLACTONE 25 MG PO TABS
25 MG | Freq: Every day | ORAL | Status: AC
Start: 2023-01-09 — End: 2023-01-10
  Administered 2023-01-10 (×2): 25 mg via ORAL

## 2023-01-09 MED ORDER — OXYCODONE-ACETAMINOPHEN 5-325 MG PO TABS
5-325 | ORAL | Status: DC | PRN
Start: 2023-01-09 — End: 2023-01-10

## 2023-01-09 MED ORDER — HEPARIN SODIUM (PORCINE) 1000 UNIT/ML IJ SOLN
1000 | INTRAMUSCULAR | Status: DC | PRN
Start: 2023-01-09 — End: 2023-01-09
  Administered 2023-01-09: 16:00:00 5000 [IU] via INTRAVENOUS

## 2023-01-09 MED ORDER — IOPAMIDOL 61 % IV SOLN
61 | INTRAVENOUS | Status: AC | PRN
Start: 2023-01-09 — End: 2023-01-09
  Administered 2023-01-09: 16:00:00 60 mL via INTRA_ARTERIAL

## 2023-01-09 MED ORDER — ACETAMINOPHEN 325 MG PO TABS
325 | ORAL | Status: DC | PRN
Start: 2023-01-09 — End: 2023-01-10

## 2023-01-09 MED ORDER — SODIUM CHLORIDE 0.9 % IV SOLN
0.9 | INTRAVENOUS | Status: DC
Start: 2023-01-09 — End: 2023-01-09

## 2023-01-09 MED ORDER — HEPARIN (PORCINE) IN NACL 1000-0.9 UT/500ML-% IV SOLN
INTRAVENOUS | Status: DC
Start: 2023-01-09 — End: 2023-01-09
  Administered 2023-01-09: 16:00:00 1000 mL

## 2023-01-09 MED FILL — VERAPAMIL HCL 2.5 MG/ML IV SOLN: 2.5 MG/ML | INTRAVENOUS | Qty: 2

## 2023-01-09 MED FILL — SPIRONOLACTONE 25 MG PO TABS: 25 MG | ORAL | Qty: 1

## 2023-01-09 MED FILL — POTASSIUM CHLORIDE CRYS ER 20 MEQ PO TBCR: 20 MEQ | ORAL | Qty: 1

## 2023-01-09 MED FILL — HEPARIN SODIUM (PORCINE) 1000 UNIT/ML IJ SOLN: 1000 UNIT/ML | INTRAMUSCULAR | Qty: 10

## 2023-01-09 MED FILL — ENTRESTO 24-26 MG PO TABS: 24-26 MG | ORAL | Qty: 1

## 2023-01-09 MED FILL — SODIUM CHLORIDE FLUSH 0.9 % IV SOLN: 0.9 % | INTRAVENOUS | Qty: 10

## 2023-01-09 MED FILL — DOCUSATE SODIUM 100 MG PO CAPS: 100 MG | ORAL | Qty: 1

## 2023-01-09 MED FILL — MORPHINE SULFATE 2 MG/ML IJ SOLN: 2 mg/mL | INTRAMUSCULAR | Qty: 1

## 2023-01-09 MED FILL — LEVOFLOXACIN 250 MG PO TABS: 250 MG | ORAL | Qty: 3

## 2023-01-09 MED FILL — FUROSEMIDE 40 MG PO TABS: 40 MG | ORAL | Qty: 1

## 2023-01-09 MED FILL — GERI-KOT 8.6 MG PO TABS: 8.6 MG | ORAL | Qty: 1

## 2023-01-09 MED FILL — GNP LIDOCAINE PAIN RELIEF 4 % EX PTCH: 4 % | CUTANEOUS | Qty: 1

## 2023-01-09 MED FILL — HEPARIN SODIUM (PORCINE) 5000 UNIT/ML IJ SOLN: 5000 UNIT/ML | INTRAMUSCULAR | Qty: 1

## 2023-01-09 MED FILL — CARVEDILOL 6.25 MG PO TABS: 6.25 MG | ORAL | Qty: 1

## 2023-01-09 MED FILL — NITROGLYCERIN 500 MCG / 10 ML VIAL: Qty: 1

## 2023-01-09 MED FILL — MELATONIN 3 MG PO TABS: 3 MG | ORAL | Qty: 1

## 2023-01-09 MED FILL — LIDOCAINE HCL 1 % IJ SOLN: 1 % | INTRAMUSCULAR | Qty: 20

## 2023-01-09 MED FILL — SODIUM CHLORIDE 0.9 % IV SOLN: 0.9 % | INTRAVENOUS | Qty: 1000

## 2023-01-09 MED FILL — HEPARIN (PORCINE) IN NACL 1000-0.9 UT/500ML-% IV SOLN: INTRAVENOUS | Qty: 1500

## 2023-01-09 NOTE — Progress Notes (Signed)
Cardiology Progress Note      Patient: Thomas Browning Age: 57 y.o. Sex: male    Date of Birth: September 04, 1965 Admit Date: 12/31/2022 PCP: Unknown, Provider, APRN - NP   MRN: [id number]  CSN: [id number]        ASSESSMENT AND RECOMMENDATIONS:   57 year old gentleman with morbid obesity who presented with progressively worsening dyspnea for 2 weeks.  He was noted to have leukocytosis, right pleural effusion and elevated BNP.  His subsequent cardiac evaluation notable for cardiomyopathy.     Non-ischemic Cardiomyopathy: LVEF 39%, grade 2 diastolic dysfunction.    Coronary angiography today did not show any obstructive coronary disease.  LVEDP noted to be 16 mm Hg  Continue carvedilol 6.25 mg twice daily and losartan 25 mg daily.  Start spironolactone 25 mg daily.  Will defer SGLT2 inhibitors to outpatient basis     2.  Acute heart failure with reduced ejection fraction (HFrEF):   LVEDP during cardiac catheterization today 16 mmHg.    Transition diuretics to furosemide 40 mg p.o. daily    3.  Pneumonia/pleural effusion: Empyema versus parapneumonic effusion.  Status post CT-guided right pigtail catheter placement on 01/01/2023.  Defer to pulmonology and the primary team.    4.  Pulmonary hypertension: Moderate pulmonary hypertension with RVSP 47 mmHg.  Likely related to heart failure and pulmonary disease, management of which as detailed above.     INTERIM EVENTS:   Overall feels well.  Underwent coronary angiography today which did not show any obstructive coronary disease.     PHYSICAL EXAM:     VS: BP 105/68   Pulse 51   Temp 98.2 F (36.8 C) (Temporal)   Resp 20   Ht 1.803 m (5\' 11" )   Wt (!) 154.3 kg (340 lb 2.7 oz)   SpO2 95%   BMI 47.44 kg/m   @[id number] @   Body mass index is 47.44 kg/m.     Intake/Output Summary (Last 24 hours) at 01/09/2023 1921  Last data filed at 01/09/2023 1843  Gross per 24 hour   Intake 720 ml   Output 1200 ml   Net -480 ml       Physical examination:  GEN: alert, orientedx3, in  respiratory distress  HEENT: PERRLA, mucous membranes are moist, No jugular venous distention or carotid bruit is noted  PULM: Decreased breath sounds over left lung base, no wheezing  CARD:  Normal S1/S2, no m/r/g  ABD:  Soft, non-tender, non-distended  NEURO:  No focal deficit, CN II-XII intact  EXT: 1+ edema of the bilateral knees    CARDIOVASCULAR STUDIES:   ECG 12/31/22: NSR 96 bpm, iLBBB, prolonged QTc (485 ms)   TTE 12/25/2022: LVEF 39%, grade 2 diastolic function, moderately dilated right ventricle with normal function, no hemodynamically significant valvular disease    LABS:     Basic Metabolic Profile   Lab Results   Component Value Date/Time    NA 134 (L) 01/09/2023 03:22 PM    NA 135 (L) 01/08/2023 06:07 AM    NA 134 (L) 01/07/2023 06:09 AM    K 3.7 01/09/2023 03:22 PM    K 3.9 01/08/2023 06:07 AM    K 3.8 01/07/2023 06:09 AM    CL 101 01/09/2023 03:22 PM    CL 99 01/08/2023 06:07 AM    CL 97 (L) 01/07/2023 06:09 AM    CO2 28 01/09/2023 03:22 PM    CO2 29 01/08/2023 06:07 AM    CO2 30 01/07/2023 06:09 AM  BUN 13 01/09/2023 03:22 PM    BUN 13 01/08/2023 06:07 AM    BUN 16 01/07/2023 06:09 AM    CREATININE 1.14 01/09/2023 03:22 PM    CREATININE 1.04 01/08/2023 06:07 AM    CREATININE 1.11 01/07/2023 06:09 AM   .       Lab Results   Component Value Date/Time    WBC 11.0 01/09/2023 03:22 PM    WBC 13.1 (H) 01/08/2023 06:07 AM    HGB 13.0 01/09/2023 03:22 PM    HGB 13.8 01/08/2023 06:07 AM    HGB 13.5 01/07/2023 06:09 AM    HCT 40.7 01/09/2023 03:22 PM    HCT 43.1 01/08/2023 06:07 AM    HCT 41.8 01/07/2023 06:09 AM    PLT 517 (H) 01/09/2023 03:22 PM    PLT 492 (H) 01/08/2023 06:07 AM         Cardiac Enzymes   No components found for: "3", "TROP"     Coagulation   Lab Results   Component Value Date/Time    INR 1.6 (H) 01/01/2023 12:30 AM    INR 1.7 (H) 12/31/2022 04:54 PM    APTT 34.6 12/31/2022 04:54 PM        Lipids/A1C   Lab Results   Component Value Date    LABA1C 5.8 (H) 12/31/2022       Lab Results    Component Value Date    CHOL 114 12/31/2022    HDL <20 (L) 12/31/2022    CHOLHDLRATIO 6.0 (H) 12/31/2022    TRIG 127 12/31/2022        MEDICATIONS:     Current Facility-Administered Medications   Medication Dose Route Frequency Provider Last Rate Last Admin    oxyCODONE-acetaminophen (PERCOCET) 5-325 MG per tablet 1 tablet  1 tablet Oral Q4H PRN Theodis Aguas, MD        sodium chloride flush 0.9 % injection 5-40 mL  5-40 mL IntraVENous 2 times per day Williams Che, Blondie Riggsbee, MD        sodium chloride flush 0.9 % injection 5-40 mL  5-40 mL IntraVENous PRN Williams Che, Yakelin Grenier, MD        0.9 % sodium chloride infusion   IntraVENous PRN Williams Che, Kasheem Toner, MD        acetaminophen (TYLENOL) tablet 650 mg  650 mg Oral Q4H PRN Williams Che, Kerrilyn Azbill, MD        diphenhydrAMINE (BENADRYL) capsule 25 mg  25 mg Oral Q6H PRN Terefe, Meta Hatchet, MD        melatonin tablet 3 mg  3 mg Oral Nightly PRN Theodis Aguas, MD        sacubitril-valsartan (ENTRESTO) 24-26 MG per tablet 1 tablet  1 tablet Oral BID Rolin Barry, DO   1 tablet at 01/09/23 1610    furosemide (LASIX) tablet 40 mg  40 mg Oral Daily Rolin Barry, DO   40 mg at 01/09/23 0824    levoFLOXacin (LEVAQUIN) tablet 750 mg  750 mg Oral Daily Romulo, Rodrigo L, MD   750 mg at 01/09/23 0823    morphine (PF) injection 2 mg  2 mg IntraVENous Q4H PRN Theodis Aguas, MD   2 mg at 01/09/23 0616    heparin (porcine) injection 5,000 Units  5,000 Units SubCUTAneous BID Verlan Friends, MD   5,000 Units at 01/09/23 0825    sodium chloride flush 0.9 % injection 10 mL  10 mL IntraVENous PRN Berton [place], MD   10 mL at 01/02/23 0930  bisacodyl (DULCOLAX) suppository 10 mg  10 mg Rectal Daily PRN Theodis Aguas, MD        docusate sodium (COLACE) capsule 100 mg  100 mg Oral BID Odelia Gage III, MD   100 mg at 01/09/23 5409    benzonatate (TESSALON) capsule 100 mg  100 mg Oral TID PRN Richarda Overlie, DO   100 mg at 01/05/23 1628    albuterol (ACCUNEB) nebulizer solution  1.25 mg  1.25 mg Nebulization Q6H PRN Richarda Overlie, DO        nystatin (MYCOSTATIN) powder   Topical BID Richarda Overlie, DO   Given at 01/09/23 8119    senna (SENOKOT) tablet 8.6 mg  1 tablet Oral Nightly Richarda Overlie, DO   8.6 mg at 01/08/23 2108    polyethylene glycol (GLYCOLAX) packet 17 g  17 g Oral Daily Richarda Overlie, DO   17 g at 01/06/23 1022    potassium chloride (KLOR-CON M) extended release tablet 20 mEq  20 mEq Oral Daily Hutchinson, Anne, DO   20 mEq at 01/09/23 0827    carvedilol (COREG) tablet 6.25 mg  6.25 mg Oral BID WC Hutchinson, Anne, DO   6.25 mg at 01/09/23 1657    sodium chloride flush 0.9 % injection 5-40 mL  5-40 mL IntraVENous 2 times per day Richarda Overlie, DO   10 mL at 01/09/23 1478    sodium chloride flush 0.9 % injection 5-40 mL  5-40 mL IntraVENous PRN Hutchinson, Anne, DO        0.9 % sodium chloride infusion   IntraVENous PRN Richarda Overlie, DO        acetaminophen (TYLENOL) tablet 650 mg  650 mg Oral Q6H PRN Richarda Overlie, DO   650 mg at 01/03/23 1245    Or    acetaminophen (TYLENOL) suppository 650 mg  650 mg Rectal Q6H PRN Hutchinson, Anne, DO        lidocaine 4 % external patch 1 patch  1 patch TransDERmal Daily Hutchinson, Anne, DO   1 patch at 01/08/23 0840     Thank you for involving [place] in Mr. Ragan's care.  We will sign off for now.  Please call with any questions or concerns.    Alexx Giambra Williams Che, MD, [facility], RPVI, [facility]  Interventional Cardiologist  [facility]  Pager: [phone]

## 2023-01-09 NOTE — Progress Notes (Signed)
OCCUPATIONAL THERAPY EVALUATION   Acknowledge Orders  Time  OT Charge Capture  Rehab Caseload Tracker   [employer] AM-PAC "6 Clicks" Daily Activity Inpatient Short Form      Patient: Thomas Browning 57 y.o. male)  Room: 5128/5128    Primary Diagnosis: SOB (shortness of breath) [R06.02]  Pleural effusion on right [J90]       Date of Admission: 12/31/2022   Length of Stay:  9 day(s)  Insurance: Payor: /      Date: 01/09/2023  Time In: 0735        Time Out: 0744       Total Minutes: 9     Isolation:  No active isolations       MDRO: No active infections    Precautions: none   Ordered weight bearing status: none    Current diet order: Diet NPO Exceptions are: Sips of Water with Meds    ASSESSMENT       Based on the objective data described below, the patient presents near/at baseline ADL performance this date.     -pt demonstrating continued IND with ADLs at this time and reports up ad lib for ADLs while in house  -no skilled acute OT needs    No further skilled OT indicated at this time. Will sign off. Please reconsult if patient condition changes.    Recommendations:  -  No skilled occupational therapy intervention recommended during acute stay.    Discharge Recommendations: -  Home alone    FUNCTIONAL ASSESSMENT  AM-PAC Inpatient Daily Activity Raw Score: 24 (01/09/23 0748)    -  Home:   At this time and based on an AM-PAC score, no further OT is recommended upon discharge due to (i.e. patient at baseline functional status).  Recommend patient returns to prior setting with prior services.    This AMPAC score should be considered in conjunction with interdisciplinary team recommendations to determine the most appropriate discharge setting. Patient's social support, diagnosis, medical stability, and prior level of function should also be taken into consideration.    Equipment Recommendations for Discharge: -  none identified at this time     PRIOR LEVEL OF FUNCTION     Information was obtained by: patient  Home  environment: Patient lives alone in a 1 story home.   Patient's bathroom has walk in shower.   Prior level of function: Patient is independent with no limitations.  Prior level of Instrumental Activities of Daily Living: Patient is independent with all IADLs.  Home equipment: none reported    PLAN      The role of Occupational Therapy was explained to the patient and that the evaluation would consist of assessing items such as; self-care, mobility, cognition and upper extremity skills. Patient presents at functional baseline this date for ADL/IADL performance. Patient in agreement to discontinue occupational therapy services at this time. If patient has a change in medical status or becomes medically appropriate for skilled occupational therapy, please re-order services. Thank you!    Patient will be discharged from Occupational Therapy services at this time.      EDUCATION/COMMUNICATION     Barriers to learning/limitations: None    Education provided ZO:[id number] on (+) role of OT, (+) OT plan of care    Educational handouts issued: none this session    Patient / family response to education: verbalized and demonstrated understanding    SUBJECTIVE     Patient "I am supposed to have a CT today, just need  to get that done."    Pain Assessment: 9 / 10, location: head, neck, back    OBJECTIVE DATA SUMMARY     Orders, labs, and chart reviewed on Thomas Browning. Communicated with nursing staff. Patient cleared to participate in Occupational Therapy evaluation.    Patient was admitted to the hospital on 12/31/2022 with   Chief Complaint   Patient presents with    Shortness of Breath     Present illness history:   Patient Active Problem List    Diagnosis Date Noted    Pleural effusion on right 12/31/2022    SOB (shortness of breath) 12/31/2022      Previous medical history:   Past Medical History:   Diagnosis Date    HTN (hypertension)     Morbid obesity (HCC)        PATIENT FOUND     Bed      COGNITIVE STATUS     Mental  status:  Orientation: Patient is oriented to person, place, month and year  Communication: normal, speech and language intact  Attention Span:  normal  General Cognition:  intact   Follows commands: intact  Safety/Judgement: good safety awareness  Hearing:   grossly intact  Vision:   grossly intact    UPPER EXTREMITY ASSESSMENT     RIGHT:  ACTIVE range of motion is WNL Strength is grossly graded as  WNL's   LEFT:   ACTIVE range of motion is WNL  Strength is grossly graded as WNL's         ACTIVITIES OF DAILY LIVING     Based on direct observation, simulation and clinical assessment.  (clincial judgement based on: activity tolerance, balance, safety awareness, cognition, functional strength/ROM/coordination of all extremities)    Eating:           - independent  Grooming:     - independent  UB bathing:   - independent  LB bathing:   -  independent  UB dressing: - independent  LB dressing: - independent  Toileting:       - independent      FUNCTIONAL MOBILITY STATUS     Mobility:  -  Supine to sit -  independent  -  Sit to supine -  independent  -  Sit to stand -  independent  -  Stand to sit -  independent       BALANCE AND ACTIVITY TOLERANCE       Activity Tolerance:   - education provided on breathing techniques d/t hospitalization in setting of DOE    FINAL LOCATION     Patient sitting edge of bed, all needs within reach, agrees to call for assistance    Thank you for this referral.    Beryle Lathe, OTR/L  January 09, 2023

## 2023-01-09 NOTE — Progress Notes (Signed)
TRANSFER - OUT REPORT:  ?  Verbal report given to Adrian(name) on pt being transferred to 5128(unit) for routine progression of care.  ?  Report consisted of patient's Situation, Background, Assessment and   Recommendations(SBAR).   ?  Information from the following report(s) Snapshot was reviewed with the receiving nurse.  Opportunity for questions and clarification was provided.     All belongings transferred with pt.     R Radial Site CDI, Soft, No Hematoma.     Awaiting transport to assist RN    Site verified with Samara Deist

## 2023-01-09 NOTE — Progress Notes (Signed)
Medical Progress Note      NAME: Thomas Browning   DOB:  25-Aug-1965  MRN:             [id number]    Date:  01/09/2023        Assessment:     Large loculated right pleural effusion  Acute hypoxic respiratory failure  Acute on chronic systolic, diastolic  Sepsis, suspect respiratory source  Hypertensive urgency  Prolonged QTc  DM 2,   Morbid obesity    Plan:     Status postcardiac catheterization today  Continue management per cardiology recommendation  Continue antibiotics with levofloxacin to complete course of treatment  Monitor CBC  Ambulate patient with PT and OT  Monitor renal function and electrolytes, replete as needed  Monitor blood glucose and continue insulin per Glucomander  Bowel Regimen for constipation  Patient has full CODE STATUS  DVT prophylaxis    Subjective:     Afebrile, no chest pain    Objective:     Vitals:    Last 24hrs VS reviewed since prior progress note. Most recent are:  Vitals:    01/09/23 1515   BP: 105/68   Pulse: 51   Resp: 20   Temp: 98.2 F (36.8 C)   SpO2: 95%     SpO2 Readings from Last 6 Encounters:   01/09/23 95%          Intake/Output Summary (Last 24 hours) at 01/09/2023 1705  Last data filed at 01/09/2023 1515  Gross per 24 hour   Intake 240 ml   Output 900 ml   Net -660 ml          Exam:     General:   Not in acute distress  HEENT: PERRLA, Neck Supple,  No JVD  Respiratory:   CTA bilaterally-no wheezes, rales, rhonchi, or crackles  Cardiac:  Regular Rate and Rythmn  - no murmurs, rubs or gallops  Abdominal:  Soft, non-tender, non-distended, positive bowel sounds  Extremities:  No cyanosis, or edema.  Skin: No rash  Neurological:  No focal neurological deficits    Medication:   Current Medications Reviewed    Current Facility-Administered Medications:     oxyCODONE-acetaminophen (PERCOCET) 5-325 MG per tablet 1 tablet, 1 tablet, Oral, Q4H PRN, Doneshia Hill, Meta Hatchet, MD    sodium chloride flush 0.9 % injection 5-40 mL, 5-40 mL, IntraVENous, 2 times per day, Williams Che, Mohsin, MD    sodium  chloride flush 0.9 % injection 5-40 mL, 5-40 mL, IntraVENous, PRN, Williams Che, Mohsin, MD    0.9 % sodium chloride infusion, , IntraVENous, PRN, Williams Che, Mohsin, MD    acetaminophen (TYLENOL) tablet 650 mg, 650 mg, Oral, Q4H PRN, Williams Che, Mohsin, MD    sacubitril-valsartan (ENTRESTO) 24-26 MG per tablet 1 tablet, 1 tablet, Oral, BID, Sposato, Joseph J, DO, 1 tablet at 01/09/23 4540    furosemide (LASIX) tablet 40 mg, 40 mg, Oral, Daily, Rolin Barry, DO, 40 mg at 01/09/23 0824    levoFLOXacin (LEVAQUIN) tablet 750 mg, 750 mg, Oral, Daily, Romulo, Rodrigo L, MD, 750 mg at 01/09/23 0823    morphine (PF) injection 2 mg, 2 mg, IntraVENous, Q4H PRN, Theodis Aguas, MD, 2 mg at 01/09/23 0616    heparin (porcine) injection 5,000 Units, 5,000 Units, SubCUTAneous, BID, Verlan Friends, MD, 5,000 Units at 01/09/23 0825    sodium chloride flush 0.9 % injection 10 mL, 10 mL, IntraVENous, PRN, Williams Che, Mohsin, MD, 10 mL at 01/02/23 0930    bisacodyl (DULCOLAX) suppository  10 mg, 10 mg, Rectal, Daily PRN, Theodis Aguas, MD    docusate sodium (COLACE) capsule 100 mg, 100 mg, Oral, BID, Odelia Gage III, MD, 100 mg at 01/09/23 9811    benzonatate (TESSALON) capsule 100 mg, 100 mg, Oral, TID PRN, Hutchinson, Anne, DO, 100 mg at 01/05/23 1628    albuterol (ACCUNEB) nebulizer solution 1.25 mg, 1.25 mg, Nebulization, Q6H PRN, Hutchinson, Anne, DO    nystatin (MYCOSTATIN) powder, , Topical, BID, Hutchinson, Anne, DO, Given at 01/09/23 9147    senna (SENOKOT) tablet 8.6 mg, 1 tablet, Oral, Nightly, Hutchinson, Anne, DO, 8.6 mg at 01/08/23 2108    polyethylene glycol (GLYCOLAX) packet 17 g, 17 g, Oral, Daily, Hutchinson, Anne, DO, 17 g at 01/06/23 1022    potassium chloride (KLOR-CON M) extended release tablet 20 mEq, 20 mEq, Oral, Daily, Hutchinson, Anne, DO, 20 mEq at 01/09/23 0827    carvedilol (COREG) tablet 6.25 mg, 6.25 mg, Oral, BID WC, Hutchinson, Anne, DO, 6.25 mg at 01/09/23 1657    sodium chloride flush 0.9 %  injection 5-40 mL, 5-40 mL, IntraVENous, 2 times per day, Hutchinson, Anne, DO, 10 mL at 01/09/23 8295    sodium chloride flush 0.9 % injection 5-40 mL, 5-40 mL, IntraVENous, PRN, Hutchinson, Anne, DO    0.9 % sodium chloride infusion, , IntraVENous, PRN, Hutchinson, Anne, DO    acetaminophen (TYLENOL) tablet 650 mg, 650 mg, Oral, Q6H PRN, 650 mg at 01/03/23 1245 **OR** acetaminophen (TYLENOL) suppository 650 mg, 650 mg, Rectal, Q6H PRN, Hutchinson, Anne, DO    lidocaine 4 % external patch 1 patch, 1 patch, TransDERmal, Daily, Hutchinson, Anne, DO, 1 patch at 01/08/23 0840      Lab:     Lab Data Reviewed: (see below)  Recent Results (from the past 24 hour(s))   Basic Metabolic Panel    Collection Time: 01/09/23  3:22 PM   Result Value Ref Range    Potassium 3.7 3.5 - 5.1 mEq/L    Chloride 101 98 - 107 mEq/L    Sodium 134 (L) 136 - 145 mEq/L    CO2 28 20 - 31 mEq/L    Glucose 144 (H) 74 - 106 mg/dl    BUN 13 9 - 23 mg/dl    Creatinine 6.21 3.08 - 1.30 mg/dl    GFR African American >60      GFR Non-African American >60      Calcium 8.7 8.7 - 10.4 mg/dl    Anion Gap 6 5 - 15 mmol/L   CBC with Auto Differential    Collection Time: 01/09/23  3:22 PM   Result Value Ref Range    WBC 11.0 4.0 - 11.0 1000/mm3    RBC 4.61 3.80 - 5.70 M/uL    Hemoglobin 13.0 13.0 - 18.0 gm/dl    Hematocrit 65.7 84.6 - 55.0 %    MCV 88.3 80.0 - 98.0 fL    MCH 28.2 23.0 - 34.6 pg    MCHC 31.9 30.0 - 36.0 gm/dl    Platelets 962 (H) 952 - 450 1000/mm3    MPV 8.8 6.0 - 10.0 fL    RDW 45.7 (H) 35.1 - 43.9      Nucleated RBCs 0 0 - 0      Immature Granulocytes % 2.2 0.0 - 3.0 %    Neutrophils Segmented 67.6 (H) 34 - 64 %    Lymphocytes 17.7 (L) 28 - 48 %    Monocytes 10.2 1 - 13 %  Eosinophils 1.7 0 - 5 %    Basophils 0.6 0 - 3 %   Magnesium    Collection Time: 01/09/23  3:22 PM   Result Value Ref Range    Magnesium 1.7 1.6 - 2.6 mg/dL         @[id number] @    Principal Problem:    Pleural effusion on right  Active Problems:    Acute on chronic  systolic congestive heart failure, NYHA class 2 (HCC)  Resolved Problems:    * No resolved hospital problems. *    ____________________________________________________________________      Attending Physician: Theodis Aguas, MD       Dragon medical dictation software was used for portions of this report. Unintended voice recognition errors may occur.

## 2023-01-09 NOTE — Procedures (Signed)
[facility]  Cardiac Catheterization Report  CONSENT  The benefits and risks of the procedure were discussed at length with the patient. The patient's questions were answered and the patient wished to proceed.  Patient was brought to the cardiac cath lab after informed consent was obtained.  Patient was prepped and draped in sterile fashion.     PROCEDURES PERFORMED  Coronary angiography and left heart catheterization (16109)     INDICATION FOR PROCEDURE  57 year old gentleman with history of hypertension, hyperlipidemia who presented with dyspnea and noted to have new heart failure and new cardiomyopathy (LVEF 39%).  Initial plan was for patient to undergo coronary CTA, however he was unable to undergo the study, thus he was referred for coronary angiography to evaluate for ischemic heart disease.    ARTERIAL ACCESS  Arterial access was performed via right radial approach using a 6 French 10 cm Glide sheath. The shealth was flushed immediately after placemement and intermittently during the procedure.  The patient received intra-arterial administration of verapamil and nitroglycerin folllowed by intravenous UFH.     DIAGNOSTIC CORONARY ANGIOGRAPHY  A 5 [place] JR4 diagnostic catheter was advanced to the ascending aorta and used to engage the right coronary artery.  A 5 French JL3.5 diagnostic catheter was advanced to the ascending aorta and used to engage the left main coronary artery. Coronary angiography was performed in multiple projections using manual contrast injections.      LEFT HEART CATHETERIZATION  A 5 [place] JR4 was inserted retrogradely across the aortic valve into the left ventricle.  Pressure measurements were obtained, and a pullback pressure measurement across the aortic valve was performed.    CORONARY ANGIOGRAPHIC DATA    Right dominant    1. Left Main: The LMCA is a large vessel and bifurcates into LAD and LCx.  There is no significant LMCA disease.  2. Left Anterior  Descending(LAD): The LAD is a large vessel and wraps around the apex.  There is no significant LAD disease.  3. Left Circumflex (LCX): The LCx is a large vessel.  There is no significant LCx disease.  4. Right Coronary Artery (RCA): The right coronary artery is a large, dominant vessel.  There is no significant disease involving the RCA.      HEMODYNAMICS  Ao 105/77/87  LV 116/15  LVEDP 18    IMPRESSION:  No epicardial, obstructive coronary disease.  Nonischemic cardiomyopathy.  Mildly elevated LVEDP and mild transaortic gradient (peak to peak gradient 11 mmHg).    PLAN:  Routine post-procedure care and hydration post-procedure per protocol  Secondary prevention for cardiomyopathy    Electronically signed by:    Berton Burnside, MD, [facility], RPVI, [facility]  Interventional Cardiologist  [facility]  Pager: [phone]

## 2023-01-09 NOTE — Progress Notes (Signed)
TRANSFER - IN REPORT    Patient transferred to Cardiac Cath Holding area bay 4.   Placed on monitor. VSS.   Verbal report received from  [facility]    Report consisted of patient's Situation, Background, Assessment and   Recommendations(SBAR).     R Radial Site CDI, Soft, No Hematoma.    Will continue to monitor patient.            ?

## 2023-01-09 NOTE — Progress Notes (Signed)
PHYSICAL  THERAPY SCREEN   Acknowledge Orders Time PT Charge Capture Rehab Caseload Tracker   -    Patient: Thomas Browning 57 y.o. male)  Room: 5128/5128    Primary Diagnosis: SOB (shortness of breath) [R06.02]  Pleural effusion on right [J90]       Date of Admission: 12/31/2022   Length of Stay:  9 day(s)  Insurance: Payor: /      Date: 01/09/2023  Time In: 0921       Time Out: 0923   Total Minutes: 2      Isolation:  No active isolations       MDRO: No active infections    Precautions: falls   Ordered weight bearing status: no weight bearing restriction       Orders, labs, and chart reviewed on Thomas Browning. Discussed with RN.    SUBJECTIVE:     Patient reports "wasn't someone just in here?" Referring to OT performing evaluation.     OBJECTIVE/ASSESSMENT:     Physical Therapy order received. The role of Physical Therapy was explained to the patients. Pt reported that he has been up ambulating around room and hallways without AD ad lib without instability or LOB noted. Therapist observed pt ambulating to bathroom at end of session, pt demo (I) and good balance.     Patient found: Seated in bed side chair. (-) bed alarm.    Patient was not seen for skilled Physical Therapy evaluation secondary to patient declined need for Physical Therapy.    PT SCREEN  Patient was asked if they were having any difficulties or changes in their ability to perform the below activities since hospitalization.     ACTIVITY  PATIENT'S RESPONSE   Do you feel there has been a change in your ability to perform ambulation?   []   Yes, but feel they will return to baseline as they mobilize and their medical status improve    [x]   No, they are at baseline     Do you feel there has been a change in your ability to perform functional mobility (getting in/out of bed, getting on/off commode, standing with ADLs)? []   Yes, but feel they will return to baseline as they mobilize and their medical status improves    [x]   No, they are at baseline       Do  you feel there has been a change in your arm or leg movements or strength?   []   Yes, but feel they will return to baseline as  their medical status improves    [x]   No, they are at baseline       Do you feel you need Physical Therapy?   []   Yes  [x]   No, they are at baseline       PLAN:     PT will sign off at this time      Thomas Browning, PT

## 2023-01-09 NOTE — Progress Notes (Signed)
[facility]   (A Division of [facility])    Follow up Note      Date of Admission: 12/31/2022    Date of Note: 01/09/23        Subjective / Interval History:  Went for cardiac cath today.  Says it went "great".  Awaiting report.  No complaints         Current Antimicrobials:    Prior Antimicrobials:  Levofloxacin 5/31 - 3  Doxycycline 5/26 - 1  Vanc 5/26 - 1 Piptazo 5/26 - 3 Ceftriaxone 5/29 - 1          Assessment:  Rec / Plan:   Right Pleural effusion, parapneumonic  - two weeks worsening cough, some mild chills, sweats, no fever, + hoarseness  - WBC count 34.5  - CXR 5/26:  mod to large R loculated pleural effusion w/ atelectasis in underlying lung  - spcx 5/26 Haemophilus influenzae  - Urag neg Legionella, S pneumo  - refused CT placement in ER 5/26  - s/p 5/27 CT guided R chest tube placement - 300 cc dark yellow, mildly bloody fluid. 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  He has chronic "allergic" symptoms of rhinorrhea, cough that he uses OTC allergy medications for.  Around 2 weeks PTA he noticed worsening of his cough with shortness of breath.  Cough was productive of light to dark-brown sputum, occasionally blood-tinged.  Had no fever but had occasional mild chills and non-drenching sweats. Developed hoarseness which worsened around one week prior to admission and dyspnea worsened leading to his presentation at ER 5/26.   Afebrile but WBC count was 34.5 and CXR showed a moderated to large R loculated pleural effusion w/ atelectasis in underlying lung.  Left lung is clear.  Acute Respiratory Pathogen Panel was negative. Seen by Dr. Effie Shy of Cardiothoracic Surgery who offered Chest tube placement in ED but the patient declined so CT guided Chest tube placement was scheduled.  Vancomycin, Piperacillin-tazobactam and Doxycycline started 5/26.     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  He has no known exposure to TB, does not keep pet birds, is not exposed to poultry or other livestock.  He does not smoke cigarettes or vape but admits to occasional "weed".  Denies IV drug use       Current Facility-Administered Medications   Medication Dose Route Frequency Provider Last Rate Last Admin    oxyCODONE-acetaminophen (PERCOCET) 5-325 MG per tablet 1 tablet  1 tablet Oral Q4H PRN Theodis Aguas, MD        sodium chloride flush 0.9 % injection 5-40 mL  5-40 mL IntraVENous 2 times per day Berton Collyer, MD  sodium chloride flush 0.9 % injection 5-40 mL  5-40 mL IntraVENous PRN Williams Che, Mohsin, MD        0.9 % sodium chloride infusion   IntraVENous PRN Williams Che, Mohsin, MD        acetaminophen (TYLENOL) tablet 650 mg  650 mg Oral Q4H PRN Williams Che, Mohsin, MD        sacubitril-valsartan (ENTRESTO) 24-26 MG per tablet 1 tablet  1 tablet Oral BID Rolin Barry, DO   1 tablet at 01/09/23 1610    furosemide (LASIX) tablet 40 mg  40 mg Oral Daily Rolin Barry, DO   40 mg at 01/09/23 0824    levoFLOXacin (LEVAQUIN) tablet 750 mg  750 mg Oral Daily Nicolette Gieske, Gretel Acre, MD   750 mg at 01/09/23 9604    morphine (PF) injection 2 mg  2 mg IntraVENous Q4H PRN Theodis Aguas, MD   2 mg at 01/09/23 0616    heparin (porcine) injection 5,000 Units  5,000 Units SubCUTAneous BID Verlan Friends, MD   5,000 Units at 01/09/23 0825    sodium chloride flush 0.9 % injection 10 mL  10 mL IntraVENous PRN  Berton [place], MD   10 mL at 01/02/23 0930    bisacodyl (DULCOLAX) suppository 10 mg  10 mg Rectal Daily PRN Theodis Aguas, MD        docusate sodium (COLACE) capsule 100 mg  100 mg Oral BID Odelia Gage III, MD   100 mg at 01/09/23 5409    benzonatate (TESSALON) capsule 100 mg  100 mg Oral TID PRN Richarda Overlie, DO   100 mg at 01/05/23 1628    albuterol (ACCUNEB) nebulizer solution 1.25 mg  1.25 mg Nebulization Q6H PRN Richarda Overlie, DO        nystatin (MYCOSTATIN) powder   Topical BID Richarda Overlie, DO   Given at 01/09/23 8119    senna (SENOKOT) tablet 8.6 mg  1 tablet Oral Nightly Richarda Overlie, DO   8.6 mg at 01/08/23 2108    polyethylene glycol (GLYCOLAX) packet 17 g  17 g Oral Daily Richarda Overlie, DO   17 g at 01/06/23 1022    potassium chloride (KLOR-CON M) extended release tablet 20 mEq  20 mEq Oral Daily Hutchinson, Anne, DO   20 mEq at 01/09/23 0827    carvedilol (COREG) tablet 6.25 mg  6.25 mg Oral BID WC Hutchinson, Anne, DO   6.25 mg at 01/09/23 1657    sodium chloride flush 0.9 % injection 5-40 mL  5-40 mL IntraVENous 2 times per day Richarda Overlie, DO   10 mL at 01/09/23 1478    sodium chloride flush 0.9 % injection 5-40 mL  5-40 mL IntraVENous PRN Hutchinson, Anne, DO        0.9 % sodium chloride infusion   IntraVENous PRN Richarda Overlie, DO        acetaminophen (TYLENOL) tablet 650 mg  650 mg Oral Q6H PRN Richarda Overlie, DO   650 mg at 01/03/23 1245    Or    acetaminophen (TYLENOL) suppository 650 mg  650 mg Rectal Q6H PRN Hutchinson, Anne, DO        lidocaine 4 % external patch 1 patch  1 patch TransDERmal Daily Hutchinson, Anne, DO   1 patch at 01/08/23 0840        ROS:  Negative except as in interval history      Objective:    Vitals:    01/09/23 1515   BP: 105/68  Pulse: 51   Resp: 20   Temp: 98.2 F (36.8 C)   SpO2: 95%        Physical Exam   Constitutional:       General: He is not in acute distress.     Appearance: He is obese. He is not toxic-appearing.    HENT:      Head: Normocephalic and atraumatic.      Nose: Nose normal.      Mouth/Throat:      Pharynx: Oropharynx is clear.   Eyes:      Extraocular Movements: Extraocular movements intact.      Conjunctiva/sclera: Conjunctivae normal.   Cardiovascular:      Rate and Rhythm: Regular rhythm.      Heart sounds: No murmur heard.     No gallop.   Pulmonary:       no crackles or wheezes  Abdominal:      Palpations: Abdomen is soft.      Tenderness: There is no abdominal tenderness.   Musculoskeletal:      Cervical back: Neck supple.      Right lower leg: No edema.      Left lower leg: No edema.   Skin:     Findings: No rash.   Neurological:      Mental Status: He is alert and oriented to person, place, and time.   Psychiatric:         Mood and Affect: Mood normal.         Behavior: Behavior normal.     Recent Labs     01/09/23  1522 01/08/23  0607 01/07/23  0609   WBC 11.0 13.1* 17.2*   HGB 13.0 13.8 13.5   HCT 40.7 43.1 41.8   MCV 88.3 89.6 87.6   PLT 517* 492* 474*          Recent Labs     01/09/23  1522 01/08/23  0607 01/07/23  0609   BUN 13 13 16    CREATININE 1.14 1.04 1.11         Results       Procedure Component Value Units Date/Time    Culture, Body Fluid [[id number]] Collected: 01/01/23 1440    Order Status: Completed Specimen: Body fld Updated: 01/06/23 0949     Gram Stain Result Moderate WBC'S  No Organisms Seen        Culture Result No Growth After 5 Days       Culture, Respiratory [[id number]]  (Abnormal) Collected: 12/31/22 1907    Order Status: Completed Specimen: RESPIRATORY Updated: 01/04/23 1237     Gram Stain Result       <10 Epithelial cells/lpf  >25 WBC's/lpf  Rare Gram Positive Cocci  Rare Gram Variable Bacilli       Culture Result       Moderate Commensal Flora Isolated           ISOLATE --        Moderate  Haemophilus influenzae  Beta-Lactamase Positive      Culture, Urine [[id number]]  (Abnormal) Collected: 12/31/22 1730    Order Status: Completed Specimen: Urine Updated: 01/02/23 0831      Culture Result --        60,000 CFU/mL  Skin and/or genital contamination      Legionella antigen, urine [[id number]] Collected: 12/31/22 1656    Order Status: Completed Specimen: Urine Updated: 12/31/22 1746     Legionella Urinary Ag NEGATIVE  Comment: Presumptive negative for L. pneumophila serogroup 1 antigen in urine, suggesting no recent or  current infection. Infection due to Legionella cannot be ruled out since other serogroups and  species may cause disease, antigen may not be present in urine in early infection and the level  of antigen present in the urine may be below the detection limit of the test.           Strep Pneumoniae Antigen [[id number]] Collected: 12/31/22 1656    Order Status: Completed Specimen: Urine Updated: 12/31/22 1746     STREP PNEUMONIAE ANTIGEN, URINE NEGATIVE        Comment: Presumptive negative for pneumococcal pneumonia, suggesting no current or recent pneumococcal  infection.  Infection due to S. pneumoniae cannot be ruled out since the antigen present in the  sample may be below the detection limit of the test.         Blood Culture 1 [[id number]] Collected: 12/31/22 1637    Order Status: Completed Specimen: Blood Updated: 01/06/23 0719     BLOOD CULTURE RESULT No Growth at 5 days       Blood Culture 2 [[id number]] Collected: 12/31/22 1637    Order Status: Completed Specimen: Blood Updated: 01/06/23 0719     BLOOD CULTURE RESULT No Growth at 5 days       Respiratory Pathogen Panel [[id number]] Collected: 12/31/22 1545    Order Status: Completed Specimen: Nasopharyngeal Swab Updated: 12/31/22 1940     Adenovirus NOT DETECTED        Coronavirus 299E NOT DETECTED        Coronavirus HKU1 NOT DETECTED        Coronavirus NL63 NOT DETECTED        Coronavirus OC43 NOT DETECTED        Human Metapneumovirus NOT DETECTED        INFLUENZA A H1 NOT DETECTED        Influenza H1N1 NOT DETECTED        Influenza A PCR NOT DETECTED        Influenza B PCR NOT DETECTED        Influenza A H3  NOT DETECTED        Parainfluenza 1 NOT DETECTED        Parainfluenza 2 NOT DETECTED        Parainfluenza 3 NOT DETECTED        Parainfluenza NOT DETECTED        Rhinovirus/Enterovirus NOT DETECTED        RSV PCR NOT DETECTED        Bordetella Pertussis NOT DETECTED        Chlamydophilia pneumoniae NOT DETECTED        Mycoplasma pneumoniae NOT DETECTED        SARS-CoV-2 NOT DETECTED        Comment: A Not Detected (negative) test result for this test means that SARS- CoV-2 RNA was not present  in the specimen above the limit of detection. However, a negative result does not rule out the  possibility of COVID-19 and should not be used as the sole basis for treatment or patient  management decisions.   This test for SARS-CoV2 has been authorized by FDA under an Emergency Use Authorization (EUA).         Rapid Strep Screen [[id number]] Collected: 12/31/22 1545    Order Status: Completed Specimen: Throat Updated: 12/31/22 1747     Strep A Ag       Negative - No Streptococcus  Group A DNA was Detected.           Comment: NEGATIVE Streptococcus group A PCR do NOT REFLEX to culture since PCR is very sensitive (equal  to culture for Strep. group A), however, if pharyngitis is suspected by another beta hemolytic  Streptococcus group then you must order a throat culture. Call microbiology ext [phone] if culture  is needed to be added on for testing.         MRSA by PCR [[id number]] Collected: 12/31/22 1545    Order Status: Completed Specimen: Nares Updated: 12/31/22 1852     MRSA PCR screen NEGATIVE                    Jerilynn Som, MD, FIDSA  [facility]

## 2023-01-10 LAB — BASIC METABOLIC PANEL
Anion Gap: 8 mmol/L (ref 5–15)
BUN: 14 mg/dl (ref 9–23)
CO2: 26 mEq/L (ref 20–31)
Calcium: 9.1 mg/dl (ref 8.7–10.4)
Chloride: 98 mEq/L (ref 98–107)
Creatinine: 1.16 mg/dl (ref 0.70–1.30)
GFR African American: >60
GFR Non-African American: >60
Glucose: 147 mg/dl — ABNORMAL HIGH (ref 74–106)
Potassium: 5 mEq/L (ref 3.5–5.1)
Sodium: 132 mEq/L — ABNORMAL LOW (ref 136–145)

## 2023-01-10 LAB — CBC WITH AUTO DIFFERENTIAL
Basophils: 0.5 % (ref 0–3)
Eosinophils: 1.5 % (ref 0–5)
Hematocrit: 41.7 % (ref 36.0–55.0)
Hemoglobin: 13.5 gm/dl (ref 13.0–18.0)
Immature Granulocytes %: 1.7 % (ref 0.0–3.0)
Lymphocytes: 16 % — ABNORMAL LOW (ref 28–48)
MCH: 28.8 pg (ref 23.0–34.6)
MCHC: 32.4 gm/dl (ref 30.0–36.0)
MCV: 89.1 fL (ref 80.0–98.0)
MPV: 9 fL (ref 6.0–10.0)
Monocytes: 10.1 % (ref 1–13)
Neutrophils Segmented: 70.2 % — ABNORMAL HIGH (ref 34–64)
Nucleated RBCs: 0 (ref 0–0)
Platelets: 607 10*3/uL — ABNORMAL HIGH (ref 140–450)
RBC: 4.68 M/uL (ref 3.80–5.70)
RDW: 45.7 — ABNORMAL HIGH (ref 35.1–43.9)
WBC: 12.3 10*3/uL — ABNORMAL HIGH (ref 4.0–11.0)

## 2023-01-10 LAB — MAGNESIUM: Magnesium: 1.9 mg/dL (ref 1.6–2.6)

## 2023-01-10 MED ORDER — CARVEDILOL 6.25 MG PO TABS
6.25 MG | ORAL_TABLET | Freq: Two times a day (BID) | ORAL | 3 refills | Status: AC
Start: 2023-01-10 — End: ?

## 2023-01-10 MED ORDER — ATORVASTATIN CALCIUM 10 MG PO TABS
10 MG | ORAL_TABLET | Freq: Every day | ORAL | 3 refills | Status: AC
Start: 2023-01-10 — End: ?

## 2023-01-10 MED ORDER — SPIRONOLACTONE 25 MG PO TABS
25 MG | ORAL_TABLET | Freq: Every day | ORAL | 3 refills | Status: AC
Start: 2023-01-10 — End: ?

## 2023-01-10 MED ORDER — FUROSEMIDE 40 MG PO TABS
40 MG | ORAL_TABLET | Freq: Every day | ORAL | 3 refills | Status: AC
Start: 2023-01-10 — End: ?

## 2023-01-10 MED ORDER — SACUBITRIL-VALSARTAN 24-26 MG PO TABS
24-26 MG | ORAL_TABLET | Freq: Two times a day (BID) | ORAL | 1 refills | Status: AC
Start: 2023-01-10 — End: ?

## 2023-01-10 MED ORDER — LEVOFLOXACIN 750 MG PO TABS
750 MG | ORAL_TABLET | Freq: Every day | ORAL | 0 refills | Status: AC
Start: 2023-01-10 — End: 2023-01-12

## 2023-01-10 MED ORDER — ASPIRIN 81 MG PO TBEC
81 MG | ORAL_TABLET | Freq: Every day | ORAL | 0 refills | Status: AC
Start: 2023-01-10 — End: ?

## 2023-01-10 MED FILL — SPIRONOLACTONE 25 MG PO TABS: 25 MG | ORAL | Qty: 1

## 2023-01-10 MED FILL — SODIUM CHLORIDE FLUSH 0.9 % IV SOLN: 0.9 % | INTRAVENOUS | Qty: 10

## 2023-01-10 MED FILL — DIPHENHYDRAMINE HCL 25 MG PO CAPS: 25 MG | ORAL | Qty: 1

## 2023-01-10 MED FILL — POTASSIUM CHLORIDE CRYS ER 20 MEQ PO TBCR: 20 MEQ | ORAL | Qty: 1

## 2023-01-10 MED FILL — HEPARIN SODIUM (PORCINE) 5000 UNIT/ML IJ SOLN: 5000 UNIT/ML | INTRAMUSCULAR | Qty: 1

## 2023-01-10 MED FILL — LEVOFLOXACIN 250 MG PO TABS: 250 MG | ORAL | Qty: 3

## 2023-01-10 MED FILL — CARVEDILOL 6.25 MG PO TABS: 6.25 MG | ORAL | Qty: 1

## 2023-01-10 MED FILL — DOCUSATE SODIUM 100 MG PO CAPS: 100 MG | ORAL | Qty: 1

## 2023-01-10 MED FILL — ENTRESTO 24-26 MG PO TABS: 24-26 MG | ORAL | Qty: 1

## 2023-01-10 MED FILL — FUROSEMIDE 40 MG PO TABS: 40 MG | ORAL | Qty: 1

## 2023-01-10 MED FILL — POLYETHYLENE GLYCOL 3350 17 G PO PACK: 17 g | ORAL | Qty: 1

## 2023-01-10 MED FILL — MORPHINE SULFATE 2 MG/ML IJ SOLN: 2 mg/mL | INTRAMUSCULAR | Qty: 1

## 2023-01-10 MED FILL — GNP LIDOCAINE PAIN RELIEF 4 % EX PTCH: 4 % | CUTANEOUS | Qty: 1

## 2023-01-10 NOTE — Progress Notes (Signed)
CM received notification of the pt d/c.  No care needs noted.  Pt family to provide him with transportation home.

## 2023-01-10 NOTE — Progress Notes (Addendum)
The patient had an episode of 8 beats of VT Dr Tenny Craw notified said cancel discharge home and notify the cardiologist.  At [phone] paged Dr Williams Che ( Cardiologist ) waiting for the call back.  AT 1914 Dr Williams Che called back asked the K level today which is 5.0 and said its OK to go home.  AT 0958 Dr Tenny Craw said to call back the cardiologist needed to write on notes that the patient is clear to go home.  At 49 Spoke with Dr Johney Maine said " I'm at the office and you call Dr Avon Gully to address the notes"  At 1008 Leave voice mail to Dr Lauree Chandler [phone] waiting for the call back.  AT 1013 Dr Avon Gully called back said he will write the notes OK for D/C home.

## 2023-01-10 NOTE — Plan of Care (Signed)
Problem: Safety - Adult  Goal: Free from fall injury  01/10/2023 1038 by Everlena Cooper, RN  Outcome: Adequate for Discharge  01/09/2023 2232 by Sherlene Shams, RN  Outcome: Progressing

## 2023-01-10 NOTE — Progress Notes (Signed)
Cardiology Progress Note      Patient: Thomas Browning Age: 57 y.o. 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    Coronary angiography without significant obstructive disease.  Continue medical therapy.     2.  Nonsustained ventricular tachycardia  Brief 8 beat run during coughing.  No significant symptoms.  Patient may discharge to home today as scheduled.  No indication for LifeVest.  Will arrange for outpatient MCOT      Lauree Chandler, DO, [facility]  [facility]  [facility]  Pager: [phone]  Office: [phone]       INTERIM EVENTS:   Overall feels well.  Afebrile intermittent SVT overnight.     PHYSICAL EXAM:     VS: BP 128/84   Pulse 70   Temp 97.5 F (36.4 C) (Temporal)   Resp 20   Ht 1.803 m (5\' 11" )   Wt (!) 154.3 kg (340 lb 2.7 oz)   SpO2 (!) 88%   BMI 47.44 kg/m   @WEIGHTLAST3IP @   Body mass index is 47.44 kg/m.     Intake/Output Summary (Last 24 hours) at 01/10/2023 1226  Last data filed at 01/10/2023 0546  Gross per 24 hour   Intake 956 ml   Output 1100 ml   Net -144 ml       Physical examination:  GEN: alert, orientedx3, in respiratory distress  HEENT: No jugular venous distention or carotid bruit is noted  PULM: Decreased breath sounds over left lung base, no wheezing  CARD:  Normal S1/S2, no m/r/g  ABD:  Soft, non-tender, non-distended  NEURO:  No focal deficit, CN II-XII intact  EXT: 1+ edema of the bilateral knees    CARDIOVASCULAR STUDIES:   ECG 12/31/22: NSR 96 bpm, iLBBB, prolonged QTc (485 ms)   TTE 12/25/2022: LVEF 39%, grade 2 diastolic function, moderately dilated right ventricle with normal function, no hemodynamically significant valvular disease    LABS:     Basic Metabolic Profile   Lab Results   Component Value Date/Time    NA 132 (L) 01/10/2023 06:09 AM    NA  134 (L) 01/09/2023 03:22 PM    NA 135 (L) 01/08/2023 06:07 AM    K 5.0 01/10/2023 06:09 AM    K 3.7 01/09/2023 03:22 PM    K 3.9 01/08/2023 06:07 AM    CL 98 01/10/2023 06:09 AM    CL 101 01/09/2023 03:22 PM    CL 99 01/08/2023 06:07 AM    CO2 26 01/10/2023 06:09 AM    CO2 28 01/09/2023 03:22 PM    CO2 29 01/08/2023 06:07 AM    BUN 14 01/10/2023 06:09 AM    BUN 13 01/09/2023 03:22 PM    BUN 13 01/08/2023 06:07 AM    CREATININE 1.16 01/10/2023 06:09 AM    CREATININE 1.14 01/09/2023 03:22 PM    CREATININE 1.04 01/08/2023 06:07 AM   .       Lab Results   Component Value Date/Time    WBC 12.3 (H) 01/10/2023 06:09 AM    WBC 11.0 01/09/2023 03:22 PM    HGB 13.5 01/10/2023 06:09 AM    HGB 13.0 01/09/2023 03:22 PM    HGB 13.8 01/08/2023 06:07 AM    HCT 41.7 01/10/2023 06:09 AM  HCT 40.7 01/09/2023 03:22 PM    HCT 43.1 01/08/2023 06:07 AM    PLT 607 (H) 01/10/2023 06:09 AM    PLT 517 (H) 01/09/2023 03:22 PM         Cardiac Enzymes   No components found for: "3", "TROP"     Coagulation   Lab Results   Component Value Date/Time    INR 1.6 (H) 01/01/2023 12:30 AM    INR 1.7 (H) 12/31/2022 04:54 PM    APTT 34.6 12/31/2022 04:54 PM        Lipids/A1C   Lab Results   Component Value Date    LABA1C 5.8 (H) 12/31/2022       Lab Results   Component Value Date    CHOL 114 12/31/2022    HDL <20 (L) 12/31/2022    CHOLHDLRATIO 6.0 (H) 12/31/2022    TRIG 127 12/31/2022        MEDICATIONS:     Current Facility-Administered Medications   Medication Dose Route Frequency Provider Last Rate Last Admin    oxyCODONE-acetaminophen (PERCOCET) 5-325 MG per tablet 1 tablet  1 tablet Oral Q4H PRN Theodis Aguas, MD        sodium chloride flush 0.9 % injection 5-40 mL  5-40 mL IntraVENous 2 times per day Berton Dutton, MD   10 mL at 01/10/23 0854    sodium chloride flush 0.9 % injection 5-40 mL  5-40 mL IntraVENous PRN Williams Che, Mohsin, MD        0.9 % sodium chloride infusion   IntraVENous PRN Williams Che, Mohsin, MD        acetaminophen  (TYLENOL) tablet 650 mg  650 mg Oral Q4H PRN Williams Che, Mohsin, MD        diphenhydrAMINE (BENADRYL) capsule 25 mg  25 mg Oral Q6H PRN Theodis Aguas, MD   25 mg at 01/10/23 0701    melatonin tablet 3 mg  3 mg Oral Nightly PRN Theodis Aguas, MD   3 mg at 01/09/23 2015    spironolactone (ALDACTONE) tablet 25 mg  25 mg Oral Daily Williams Che, Mohsin, MD   25 mg at 01/10/23 0853    sacubitril-valsartan (ENTRESTO) 24-26 MG per tablet 1 tablet  1 tablet Oral BID Rolin Barry, DO   1 tablet at 01/10/23 0853    furosemide (LASIX) tablet 40 mg  40 mg Oral Daily Rolin Barry, DO   40 mg at 01/10/23 0853    levoFLOXacin (LEVAQUIN) tablet 750 mg  750 mg Oral Daily Romulo, Rodrigo L, MD   750 mg at 01/10/23 0853    morphine (PF) injection 2 mg  2 mg IntraVENous Q4H PRN Theodis Aguas, MD   2 mg at 01/10/23 0519    heparin (porcine) injection 5,000 Units  5,000 Units SubCUTAneous BID Verlan Friends, MD   5,000 Units at 01/10/23 0854    sodium chloride flush 0.9 % injection 10 mL  10 mL IntraVENous PRN Berton Waggaman, MD   10 mL at 01/02/23 0930    bisacodyl (DULCOLAX) suppository 10 mg  10 mg Rectal Daily PRN Theodis Aguas, MD        docusate sodium (COLACE) capsule 100 mg  100 mg Oral BID Odelia Gage III, MD   100 mg at 01/10/23 0853    benzonatate (TESSALON) capsule 100 mg  100 mg Oral TID PRN Richarda Overlie, DO   100 mg at 01/05/23 1628    albuterol (ACCUNEB) nebulizer solution 1.25 mg  1.25  mg Nebulization Q6H PRN Hutchinson, Anne, DO        nystatin (MYCOSTATIN) powder   Topical BID Richarda Overlie, DO   Given at 01/10/23 1096    senna (SENOKOT) tablet 8.6 mg  1 tablet Oral Nightly Richarda Overlie, DO   8.6 mg at 01/09/23 2015    polyethylene glycol (GLYCOLAX) packet 17 g  17 g Oral Daily Richarda Overlie, DO   17 g at 01/10/23 0853    potassium chloride (KLOR-CON M) extended release tablet 20 mEq  20 mEq Oral Daily Hutchinson, Anne, DO   20 mEq at 01/10/23 0853    carvedilol (COREG) tablet 6.25  mg  6.25 mg Oral BID WC Hutchinson, Anne, DO   6.25 mg at 01/10/23 0454    sodium chloride flush 0.9 % injection 5-40 mL  5-40 mL IntraVENous 2 times per day Richarda Overlie, DO   10 mL at 01/10/23 0854    sodium chloride flush 0.9 % injection 5-40 mL  5-40 mL IntraVENous PRN Hutchinson, Anne, DO        0.9 % sodium chloride infusion   IntraVENous PRN Richarda Overlie, DO        acetaminophen (TYLENOL) tablet 650 mg  650 mg Oral Q6H PRN Richarda Overlie, DO   650 mg at 01/03/23 1245    Or    acetaminophen (TYLENOL) suppository 650 mg  650 mg Rectal Q6H PRN Richarda Overlie, DO        lidocaine 4 % external patch 1 patch  1 patch TransDERmal Daily Hutchinson, Anne, DO   1 patch at 01/10/23 0854         Darletta Moll, [facility]  [facility]  [facility]  Pager: [phone]  Office: [phone]

## 2023-01-10 NOTE — Discharge Summary (Signed)
Discharge Summary      Patient Name: Thomas Browning  Medical Record Number: [id number]  Date of Birth: 07-Mar-1966  Discharge Provider: Theodis Aguas, MD  Primary Care Provider: Unknown, Provider, APRN - NP    Admit date: 12/31/2022  Discharge date: 01/10/2023  Discharge Disposition: Home.  Code Status: Full Code         Follow-up recommendations:     Follow up with PCP in 1 week      Discharge diagnosis:      Large loculated right pleural effusion  Acute hypoxic respiratory failure  Acute on chronic systolic and diastolic CHF  Sepsis, suspect respiratory source  Hypertensive urgency  Prolonged QTc  DM 2,   Morbid obesity    Discharge Medications:        Current Discharge Medication List        START taking these medications    Details   carvedilol (COREG) 6.25 MG tablet Take 1 tablet by mouth 2 times daily (with meals)  Qty: 60 tablet, Refills: 3      sacubitril-valsartan (ENTRESTO) 24-26 MG per tablet Take 1 tablet by mouth 2 times daily  Qty: 60 tablet, Refills: 1      furosemide (LASIX) 40 MG tablet Take 1 tablet by mouth daily  Qty: 60 tablet, Refills: 3      spironolactone (ALDACTONE) 25 MG tablet Take 1 tablet by mouth daily  Qty: 30 tablet, Refills: 3      levoFLOXacin (LEVAQUIN) 750 MG tablet Take 1 tablet by mouth daily for 1 dose  Qty: 1 tablet, Refills: 0      aspirin 81 MG EC tablet Take 1 tablet by mouth daily  Qty: 90 tablet, Refills: 0      atorvastatin (LIPITOR) 10 MG tablet Take 1 tablet by mouth daily  Qty: 30 tablet, Refills: 3             Discharge Diet: cardiac diet    Consultants: cardiology, pulmonary/intensive care, and ID    Procedures:   * No surgery found *      History of presenting illness: Per admitting physician     Thomas Browning is a 57 y.o. 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  History is primarily obtained from prior hospitalization at outside  facility in [place] in 2021.  Patient states he does not follow with a primary care physician and takes no prescription medications "of his own ".     Patient presents with several concerns today.  He states his primary concern is cough and shortness of breath associated with dyspnea on exertion orthopnea and bilateral leg swelling.  He states symptoms have been present for at least "a few days "and gradually worsening.  Has not tried any over-the-counter medications.  He reports his cough is productive of thick mucus, thinks that he has been around a few people who have had similar sick symptoms with respiratory infections.  He denies smoking denies marijuana use denies vaping and reports he has no history of asthma COPD or bronchitis.  To his respiratory complaints he reports feeling generally unwell, reports feeling constipated which she states "has been going on a long time and was never fixed ".  He also reports that he has some discomfort in his neck which she states "I have all the time ".     Labs in the ER concerning  for white blood cell count of 34.5, creatinine 1.3 with a BUN of 30 BNP greater than 1700 troponin is 27 procalcitonin later resulted quite elevated.  Chest x-ray concerning for large loculated right pleural effusion.  Patient was started on supplemental oxygen on 2 to 3 L nasal cannula.  He was started on breathing treatments broad-spectrum antibiotics.  Pulmonary and cardiology as well as thoracic surgery consulted.  EKG revealed prolonged QTc.    Hospital course:      Right Pleural effusion, parapneumonic  - two weeks worsening cough, some mild chills, sweats, no fever, + hoarseness  - WBC count 34.5  - CXR 5/26:  mod to large R loculated pleural effusion w/ atelectasis in underlying lung  - spcx 5/26 Haemophilus influenzae  - Urag neg Legionella, S pneumo  - refused CT placement in ER 5/26  - s/p 5/27 CT guided R chest tube placement - 300 cc dark yellow, mildly bloody fluid. GS mod  wbc's, NOS cx NG   - CT Chest 5/28: moderate loculated right pleural effusion with atelectasis  - CXR 5/30 improved R lung base opacities  - Chest tube removed 5/30  - WBC down to 13.1 6/3    Non-ischemic Cardiomyopathy:     LVEF 39%, grade 2 diastolic dysfunction.    Coronary angiography today did not show any obstructive coronary disease.  LVEDP noted to be 16 mm Hg  Continue goal-directed medical therapy      Acute systolic and diastolic heart failure:  Continue medical management per cardiology recommendation    Patient has nonsustained 8 beats of V. tach this morning prior to discharge.  I canceled the discharge and requested cardiology evaluated and cleared.  Cardiology stated that patient is cleared can be discharged home.      Physical exam:  General:   Not in acute distress  HEENT: PERRLA, Neck Supple,  No JVD  Respiratory:   CTA bilaterally-no wheezes, rales, rhonchi, or crackles  Cardiac:  Regular Rate and Rythmn  - no murmurs, rubs or gallops  Abdominal:  Soft, non-tender, non-distended, positive bowel sounds  Extremities:  No cyanosis, or edema.  Skin: No rash  Neurological:  No focal neurological deficits    Discharge condition: Good    Discharge activity and restrictions: activity as tolerated    Time spent with discharging patient: 38 minutes      Theodis Aguas, MD  01/10/2023  12:24 PM    Copies: Unknown, Provider, APRN - NP    Dragon medical dictation software was used for portions of this report. Unintended voice recognition errors may occur.

## 2023-06-06 ENCOUNTER — Telehealth: Payer: Self-pay | Admitting: *Deleted

## 2023-06-06 NOTE — Patient Outreach (Signed)
  Care Coordination   06/06/2023 Name: Stephen [name] MRN: [id number] DOB: 02/26/1966   Care Coordination Outreach Attempts:  An unsuccessful telephone outreach was attempted today to offer the patient information about available care coordination services.  Follow Up Plan:  Additional outreach attempts will be made to offer the patient care coordination information and services.   Encounter Outcome:  No Answer   Care Coordination Interventions:  No, not indicated    Kemper Durie RN, MSN, CCM [facility], [facility] RN Care Coordinator Direct Dial: [phone] / Main [phone] Fax [phone] Email: [email or web address] Website: [email or web address]

## 2023-06-08 ENCOUNTER — Telehealth: Payer: Self-pay | Admitting: *Deleted

## 2023-06-08 NOTE — Patient Outreach (Signed)
  Care Coordination   06/08/2023 Name: Stephen [name] MRN: [id number] DOB: Apr 18, 1966   Care Coordination Outreach Attempts:  A second unsuccessful outreach was attempted today to offer the patient with information about available care coordination services.  Follow Up Plan:  Additional outreach attempts will be made to offer the patient care coordination information and services.   Encounter Outcome:  No Answer   Care Coordination Interventions:  No, not indicated    Kemper Durie RN, MSN, CCM [facility], [facility] RN Care Coordinator Direct Dial: [phone] / Main [phone] Fax [phone] Email: [email or web address] Website: [email or web address]

## 2023-06-12 ENCOUNTER — Telehealth: Payer: Self-pay | Admitting: *Deleted

## 2023-06-12 NOTE — Patient Outreach (Signed)
  Care Coordination   06/12/2023 Name: Stephen [name] MRN: [id number] DOB: [birth year]   Care Coordination Outreach Attempts:  A third unsuccessful outreach was attempted today to offer the patient with information about available care coordination services.  Follow Up Plan:  Additional outreach attempts will be made to offer the patient care coordination information and services.   Encounter Outcome:  No Answer   Care Coordination Interventions:  No, not indicated    Kemper Durie RN, MSN, CCM [facility], [facility] RN Care Coordinator Direct Dial: [phone] / Main [phone] Fax [phone] Email: [email or web address] Website: [email or web address]

## 2023-12-24 DIAGNOSIS — Z1331 Encounter for screening for depression: Secondary | ICD-10-CM | POA: Diagnosis not present

## 2023-12-24 DIAGNOSIS — Z1389 Encounter for screening for other disorder: Secondary | ICD-10-CM | POA: Diagnosis not present

## 2023-12-24 DIAGNOSIS — Z131 Encounter for screening for diabetes mellitus: Secondary | ICD-10-CM | POA: Diagnosis not present

## 2023-12-24 DIAGNOSIS — I10 Essential (primary) hypertension: Secondary | ICD-10-CM | POA: Diagnosis not present

## 2023-12-24 DIAGNOSIS — F4321 Adjustment disorder with depressed mood: Secondary | ICD-10-CM | POA: Diagnosis not present

## 2023-12-24 DIAGNOSIS — R0683 Snoring: Secondary | ICD-10-CM | POA: Diagnosis not present

## 2023-12-24 DIAGNOSIS — Z0131 Encounter for examination of blood pressure with abnormal findings: Secondary | ICD-10-CM | POA: Diagnosis not present
